# Patient Record
Sex: Female | Born: 1945 | Race: White | Hispanic: No | Smoking: Never smoker
Health system: Southern US, Community
[De-identification: ages and names within clinical notes are randomized; demographics above are authoritative.]

## PROBLEM LIST (undated history)

## (undated) DIAGNOSIS — F329 Major depressive disorder, single episode, unspecified: Secondary | ICD-10-CM

## (undated) DIAGNOSIS — I639 Cerebral infarction, unspecified: Secondary | ICD-10-CM

## (undated) DIAGNOSIS — M1990 Unspecified osteoarthritis, unspecified site: Secondary | ICD-10-CM

## (undated) DIAGNOSIS — I10 Essential (primary) hypertension: Secondary | ICD-10-CM

## (undated) DIAGNOSIS — M51369 Other intervertebral disc degeneration, lumbar region without mention of lumbar back pain or lower extremity pain: Secondary | ICD-10-CM

## (undated) DIAGNOSIS — M545 Low back pain, unspecified: Secondary | ICD-10-CM

## (undated) DIAGNOSIS — E7800 Pure hypercholesterolemia, unspecified: Secondary | ICD-10-CM

## (undated) DIAGNOSIS — F32A Depression, unspecified: Secondary | ICD-10-CM

## (undated) DIAGNOSIS — B019 Varicella without complication: Secondary | ICD-10-CM

## (undated) DIAGNOSIS — H9190 Unspecified hearing loss, unspecified ear: Secondary | ICD-10-CM

## (undated) DIAGNOSIS — Z973 Presence of spectacles and contact lenses: Secondary | ICD-10-CM

## (undated) DIAGNOSIS — F419 Anxiety disorder, unspecified: Secondary | ICD-10-CM

## (undated) DIAGNOSIS — A6000 Herpesviral infection of urogenital system, unspecified: Secondary | ICD-10-CM

## (undated) DIAGNOSIS — R011 Cardiac murmur, unspecified: Secondary | ICD-10-CM

## (undated) DIAGNOSIS — M5136 Other intervertebral disc degeneration, lumbar region: Secondary | ICD-10-CM

## (undated) DIAGNOSIS — Z9289 Personal history of other medical treatment: Secondary | ICD-10-CM

## (undated) DIAGNOSIS — I341 Nonrheumatic mitral (valve) prolapse: Secondary | ICD-10-CM

## (undated) DIAGNOSIS — R51 Headache: Secondary | ICD-10-CM

## (undated) DIAGNOSIS — IMO0001 Reserved for inherently not codable concepts without codable children: Secondary | ICD-10-CM

## (undated) DIAGNOSIS — G8929 Other chronic pain: Secondary | ICD-10-CM

## (undated) DIAGNOSIS — M48061 Spinal stenosis, lumbar region without neurogenic claudication: Secondary | ICD-10-CM

## (undated) HISTORY — PX: FRACTURE SURGERY: SHX138

## (undated) HISTORY — DX: Depression, unspecified: F32.A

## (undated) HISTORY — DX: Major depressive disorder, single episode, unspecified: F32.9

## (undated) HISTORY — DX: Varicella without complication: B01.9

## (undated) HISTORY — DX: Anxiety disorder, unspecified: F41.9

## (undated) HISTORY — DX: Headache: R51

## (undated) HISTORY — DX: Essential (primary) hypertension: I10

## (undated) HISTORY — DX: Nonrheumatic mitral (valve) prolapse: I34.1

## (undated) HISTORY — DX: Herpesviral infection of urogenital system, unspecified: A60.00

---
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
## 1966-01-28 HISTORY — PX: PLANTAR FASCIA RELEASE: SHX2239

## 1973-01-28 DIAGNOSIS — Z9289 Personal history of other medical treatment: Secondary | ICD-10-CM

## 1973-01-28 HISTORY — PX: NASAL SINUS SURGERY: SHX719

## 1973-01-28 HISTORY — PX: FEMUR FRACTURE SURGERY: SHX633

## 1973-01-28 HISTORY — DX: Personal history of other medical treatment: Z92.89

## 1984-04-22 ENCOUNTER — Encounter: Payer: Self-pay | Admitting: Internal Medicine

## 1984-12-25 ENCOUNTER — Encounter: Payer: Self-pay | Admitting: Internal Medicine

## 1993-01-28 HISTORY — PX: TUBAL LIGATION: SHX77

## 1996-01-29 HISTORY — PX: SHOULDER OPEN ROTATOR CUFF REPAIR: SHX2407

## 1998-07-20 ENCOUNTER — Other Ambulatory Visit: Admission: RE | Admit: 1998-07-20 | Discharge: 1998-07-20 | Payer: Self-pay | Admitting: Obstetrics and Gynecology

## 1999-07-24 ENCOUNTER — Other Ambulatory Visit: Admission: RE | Admit: 1999-07-24 | Discharge: 1999-07-24 | Payer: Self-pay | Admitting: Obstetrics and Gynecology

## 1999-09-03 ENCOUNTER — Encounter: Payer: Self-pay | Admitting: Internal Medicine

## 2000-04-03 ENCOUNTER — Emergency Department (HOSPITAL_COMMUNITY): Admission: EM | Admit: 2000-04-03 | Discharge: 2000-04-04 | Payer: Self-pay | Admitting: Emergency Medicine

## 2000-04-03 ENCOUNTER — Encounter: Payer: Self-pay | Admitting: Emergency Medicine

## 2000-04-04 ENCOUNTER — Encounter: Payer: Self-pay | Admitting: Emergency Medicine

## 2000-08-12 ENCOUNTER — Other Ambulatory Visit: Admission: RE | Admit: 2000-08-12 | Discharge: 2000-08-12 | Payer: Self-pay | Admitting: Obstetrics and Gynecology

## 2001-08-18 ENCOUNTER — Other Ambulatory Visit: Admission: RE | Admit: 2001-08-18 | Discharge: 2001-08-18 | Payer: Self-pay | Admitting: Obstetrics and Gynecology

## 2003-01-18 ENCOUNTER — Other Ambulatory Visit: Admission: RE | Admit: 2003-01-18 | Discharge: 2003-01-18 | Payer: Self-pay | Admitting: Obstetrics and Gynecology

## 2004-03-08 ENCOUNTER — Other Ambulatory Visit: Admission: RE | Admit: 2004-03-08 | Discharge: 2004-03-08 | Payer: Self-pay | Admitting: Obstetrics and Gynecology

## 2005-03-05 ENCOUNTER — Other Ambulatory Visit: Admission: RE | Admit: 2005-03-05 | Discharge: 2005-03-05 | Payer: Self-pay | Admitting: Obstetrics and Gynecology

## 2005-03-05 ENCOUNTER — Encounter: Payer: Self-pay | Admitting: Internal Medicine

## 2005-04-08 ENCOUNTER — Ambulatory Visit (HOSPITAL_COMMUNITY): Admission: RE | Admit: 2005-04-08 | Discharge: 2005-04-08 | Payer: Self-pay | Admitting: Obstetrics and Gynecology

## 2005-05-13 ENCOUNTER — Ambulatory Visit: Payer: Self-pay | Admitting: Internal Medicine

## 2006-04-04 ENCOUNTER — Other Ambulatory Visit: Admission: RE | Admit: 2006-04-04 | Discharge: 2006-04-04 | Payer: Self-pay | Admitting: Obstetrics and Gynecology

## 2006-04-11 ENCOUNTER — Ambulatory Visit (HOSPITAL_COMMUNITY): Admission: RE | Admit: 2006-04-11 | Discharge: 2006-04-11 | Payer: Self-pay | Admitting: Obstetrics and Gynecology

## 2006-04-23 ENCOUNTER — Encounter: Admission: RE | Admit: 2006-04-23 | Discharge: 2006-04-23 | Payer: Self-pay | Admitting: Family Medicine

## 2006-11-12 ENCOUNTER — Encounter: Payer: Self-pay | Admitting: Internal Medicine

## 2006-11-12 ENCOUNTER — Encounter: Admission: RE | Admit: 2006-11-12 | Discharge: 2006-11-12 | Payer: Self-pay | Admitting: Obstetrics and Gynecology

## 2006-11-19 DIAGNOSIS — I059 Rheumatic mitral valve disease, unspecified: Secondary | ICD-10-CM | POA: Insufficient documentation

## 2006-11-19 DIAGNOSIS — I10 Essential (primary) hypertension: Secondary | ICD-10-CM

## 2006-12-12 ENCOUNTER — Encounter: Payer: Self-pay | Admitting: Internal Medicine

## 2006-12-15 ENCOUNTER — Other Ambulatory Visit: Admission: RE | Admit: 2006-12-15 | Discharge: 2006-12-15 | Payer: Self-pay | Admitting: Obstetrics and Gynecology

## 2007-01-29 LAB — CONVERTED CEMR LAB: Pap Smear: NORMAL

## 2007-06-16 ENCOUNTER — Ambulatory Visit: Payer: Self-pay | Admitting: Internal Medicine

## 2007-11-18 ENCOUNTER — Ambulatory Visit (HOSPITAL_COMMUNITY): Admission: RE | Admit: 2007-11-18 | Discharge: 2007-11-18 | Payer: Self-pay | Admitting: Internal Medicine

## 2008-01-13 ENCOUNTER — Ambulatory Visit: Payer: Self-pay | Admitting: Internal Medicine

## 2008-02-10 ENCOUNTER — Telehealth: Payer: Self-pay | Admitting: Internal Medicine

## 2008-03-24 ENCOUNTER — Telehealth: Payer: Self-pay | Admitting: Internal Medicine

## 2009-03-23 ENCOUNTER — Telehealth: Payer: Self-pay | Admitting: Internal Medicine

## 2009-03-28 ENCOUNTER — Ambulatory Visit: Payer: Self-pay | Admitting: Internal Medicine

## 2010-01-31 ENCOUNTER — Ambulatory Visit: Admit: 2010-01-31 | Payer: Self-pay | Admitting: Internal Medicine

## 2010-02-01 ENCOUNTER — Telehealth: Payer: Self-pay | Admitting: Internal Medicine

## 2010-02-27 NOTE — Assessment & Plan Note (Signed)
Summary: med check/refills/cjr   Vital Signs:  Patient profile:   65 year old female Weight:      154 pounds Temp:     98.3 degrees F oral Pulse rate:   72 / minute Pulse rhythm:   regular Resp:     12 per minute BP sitting:   132 / 66  (left arm) Cuff size:   regular  Vitals Entered By: Gladis Riffle, RN (March 28, 2009 9:38 AM) CC: med review and refills Is Patient Diabetic? No Comments c/o sinus congestion for 12 days   CC:  med review and refills.  History of Present Illness:  Follow-Up Visit      This is a 65 year old woman who presents for Follow-up visit.  The patient denies chest pain and palpitations.  Since the last visit the patient notes no new problems or concerns.  The patient reports taking meds as prescribed and not monitoring BP.  When questioned about possible medication side effects, the patient notes none.    All other systems reviewed and were negative   Preventive Screening-Counseling & Management  Alcohol-Tobacco     Alcohol drinks/day: 2     Smoking Status: never  Current Problems (verified): 1)  Mitral Valve Prolapse  (ICD-424.0) 2)  Hypertension  (ICD-401.9)  Current Medications (verified): 1)  Metoprolol Tartrate 100 Mg Tabs (Metoprolol Tartrate) .... Take 1/2 Tablet By Mouth Once A Day 2)  Estradiol 1 Mg Tabs (Estradiol) .... Take 1 Tablet By Mouth Once A Day 3)  Provera 5 Mg  Tabs (Medroxyprogesterone Acetate) .... Take 1 Tablet By Mouth Once A Day--Needs Office Visit For Additional Refills 4)  Caltrate 600+d 600-400 Mg-Unit  Tabs (Calcium Carbonate-Vitamin D) .... Once Daily 5)  Multivitamins   Tabs (Multiple Vitamin) .... Once Daily 6)  Vitamin E 400 Unit  Caps (Vitamin E) .... Once Daily 7)  Claritin 10 Mg  Tabs (Loratadine) .... Once Daily 8)  Zoloft 50 Mg Tabs (Sertraline Hcl) .Marland Kitchen.. 1 By Mouth Daily  Allergies: 1)  Sulfamethoxazole (Sulfamethoxazole)   Impression & Recommendations:  Problem # 1:  MITRAL VALVE PROLAPSE  (ICD-424.0)  Her updated medication list for this problem includes:    Metoprolol Tartrate 100 Mg Tabs (Metoprolol tartrate) .Marland Kitchen... Take 1/2 tablet by mouth once a day  Problem # 2:  HYPERTENSION (ICD-401.9) she understands need for health maintenance and lab work she refuses all basedon finances I'll supply with meds and wil see back in december Her updated medication list for this problem includes:    Metoprolol Tartrate 100 Mg Tabs (Metoprolol tartrate) .Marland Kitchen... Take 1/2 tablet by mouth once a day  BP today: 132/66 Prior BP: 140/76 (01/13/2008)  Complete Medication List: 1)  Metoprolol Tartrate 100 Mg Tabs (Metoprolol tartrate) .... Take 1/2 tablet by mouth once a day 2)  Estradiol 1 Mg Tabs (Estradiol) .... Take 1 tablet by mouth once a day 3)  Provera 5 Mg Tabs (Medroxyprogesterone acetate) .... Take 1 tablet by mouth once a day--needs office visit for additional refills 4)  Caltrate 600+d 600-400 Mg-unit Tabs (Calcium carbonate-vitamin d) .... Once daily 5)  Multivitamins Tabs (Multiple vitamin) .... Once daily 6)  Vitamin E 400 Unit Caps (Vitamin e) .... Once daily 7)  Claritin 10 Mg Tabs (Loratadine) .... Once daily 8)  Zoloft 50 Mg Tabs (Sertraline hcl) .Marland Kitchen.. 1 by mouth daily  Contraindications/Deferment of Procedures/Staging:    Test/Procedure: Colonoscopy    Reason for deferment: declined-financial     Test/Procedure: Mammogram  Reason for deferment: declined-financial     Test/Procedure: PAP Smear    Reason for deferment: declined-financial     Test/Procedure: FLU VAX    Reason for deferment: declined-financial     Test/Procedure: DPT vaccine    Reason for deferment: declined   Patient Instructions: 1)  see me in December (new medicare wellness) Prescriptions: ESTRADIOL 1 MG TABS (ESTRADIOL) Take 1 tablet by mouth once a day  #30 x 11   Entered and Authorized by:   Birdie Sons MD   Signed by:   Birdie Sons MD on 03/28/2009   Method used:   [facility]         [facility]  [address] [phone]       [address]       [place], [place]  [address]       Ph: [phone] or [phone]       Fax: [phone]   RxID:   [id number] ZOLOFT 50 MG TABS (SERTRALINE HCL) 1 by mouth daily  #30 x 11   Entered and Authorized by:   Birdie Sons MD   Signed by:   Birdie Sons MD on 03/28/2009   Method used:   [facility]        [facility]  [address] [phone]* (retail)       [address]       [place], [place]  [address]       Ph: [phone] or [phone]       Fax: [phone]   RxID:   [id number] PROVERA 5 MG  TABS (MEDROXYPROGESTERONE ACETATE) Take 1 tablet by mouth once a day--needs office visit for additional refills  #12 x 11   Entered and Authorized by:   Birdie Sons MD   Signed by:   Birdie Sons MD on 03/28/2009   Method used:   [facility]        [facility] [phone][facility] ([facility]ail[facility]       [address]       [place], [place]  [address]       Ph: [phone] or [phone]       Fax: [phone]   RxID:   [id number] METOPROLOL TARTRATE 100 MG TABS (METOPROLOL TARTRATE) Take 1/2 tablet by mouth once a day  #60 x 11   Entered and Authorized by:   Birdie Sons MD   Signed by:   Birdie Sons MD on 03/28/2009   Method used:   [facility]        [facility] [phone]* (retail)       [address]       [place], [place]  [id number]       Ph: [phone] or [phone]       Fax: [phone]   RxID:   [id number]

## 2010-02-27 NOTE — Progress Notes (Signed)
Summary: Pt has ov sch on 03/28/09. Req partial refill Metoprolol   Phone Note Call from Patient Call back at Home Phone [phone]   Caller: Patient Summary of Call: Pt has sch ov for 03/28/09 at 9:30am. Pt is req partial refill of Metoprolol 100mg  to last until her ov. [facility] [address] [phone] Initial call taken by: Lucy Antigua,  March 23, 2009 10:07 AM    Prescriptions: METOPROLOL TARTRATE 100 MG TABS (METOPROLOL TARTRATE) Take 1/2 tablet by mouth once a day--NEEDS OFFICE VISIT FOR ADDITIONAL REFILLS  #15 x 0   Entered by:   Willy Eddy, LPN   Authorized by:   Birdie Sons MD   Signed by:   Willy Eddy, LPN on 59/56/3875   Method used:   [facility]        [facility] [phone][facility] (retail)       [address]       [place], [place]  [address]       Ph: [phone] or [phone]       Fax: [phone]   RxID:   [id number]

## 2010-03-01 NOTE — Progress Notes (Signed)
Summary: refill until cpx   Phone Note Refill Request Call back at Home Phone [phone] Message from:  Patient---live call  Refills Requested: Medication #1:  ESTRADIOL 1 MG TABS Take 1 tablet by mouth once a day [facility] pt missed her appt due to family emergency. she did set up a medicare cpx for 03-30-2010.  Initial call taken by: Warnell Forester,  February 01, 2010 10:23 AM  Follow-up for Phone Call        Rx called to pharmacy Follow-up by: Alfred Levins, CMA,  February 01, 2010 1:31 PM    Prescriptions: ESTRADIOL 1 MG TABS (ESTRADIOL) Take 1 tablet by mouth once a day  #90 Tablet x 0   Entered by:   Alfred Levins, CMA   Authorized by:   Birdie Sons MD   Signed by:   Alfred Levins, CMA on 02/01/2010   Method used:   [facility]        [facility]  [address] [phone]* (retail)       [address]       [place], [place]  [address]       Ph: [phone] or [phone]       Fax: [phone]   RxID:   [id number]

## 2010-03-12 ENCOUNTER — Other Ambulatory Visit: Payer: Self-pay | Admitting: Internal Medicine

## 2010-03-30 ENCOUNTER — Encounter: Payer: Self-pay | Admitting: Internal Medicine

## 2010-04-17 ENCOUNTER — Encounter: Payer: Self-pay | Admitting: Internal Medicine

## 2010-04-20 ENCOUNTER — Other Ambulatory Visit (HOSPITAL_COMMUNITY)
Admission: RE | Admit: 2010-04-20 | Discharge: 2010-04-20 | Disposition: A | Discharge status: Home / Self Care | Payer: PRIVATE HEALTH INSURANCE | Source: Ambulatory Visit | Attending: Internal Medicine | Admitting: Internal Medicine

## 2010-04-20 ENCOUNTER — Ambulatory Visit (INDEPENDENT_AMBULATORY_CARE_PROVIDER_SITE_OTHER): Payer: PRIVATE HEALTH INSURANCE | Admitting: Internal Medicine

## 2010-04-20 ENCOUNTER — Encounter: Payer: Self-pay | Admitting: Internal Medicine

## 2010-04-20 DIAGNOSIS — Z124 Encounter for screening for malignant neoplasm of cervix: Secondary | ICD-10-CM | POA: Insufficient documentation

## 2010-04-20 DIAGNOSIS — N959 Unspecified menopausal and perimenopausal disorder: Secondary | ICD-10-CM

## 2010-04-20 DIAGNOSIS — F329 Major depressive disorder, single episode, unspecified: Secondary | ICD-10-CM

## 2010-04-20 DIAGNOSIS — I10 Essential (primary) hypertension: Secondary | ICD-10-CM

## 2010-04-20 DIAGNOSIS — Z79899 Other long term (current) drug therapy: Secondary | ICD-10-CM

## 2010-04-20 DIAGNOSIS — Z23 Encounter for immunization: Secondary | ICD-10-CM

## 2010-04-20 DIAGNOSIS — E785 Hyperlipidemia, unspecified: Secondary | ICD-10-CM

## 2010-04-20 DIAGNOSIS — Z0000 Encounter for general adult medical examination without abnormal findings: Secondary | ICD-10-CM

## 2010-04-20 LAB — HEPATIC FUNCTION PANEL
ALT: 15 U/L (ref 0–35)
Albumin: 3.9 g/dL (ref 3.5–5.2)
Alkaline Phosphatase: 50 U/L (ref 39–117)
Bilirubin, Direct: 0.1 mg/dL (ref 0.0–0.3)
Total Protein: 6.1 g/dL (ref 6.0–8.3)

## 2010-04-20 LAB — POCT URINALYSIS DIPSTICK
Blood, UA: NEGATIVE
Ketones, UA: NEGATIVE
Protein, UA: NEGATIVE
Spec Grav, UA: 1.02
pH, UA: 7

## 2010-04-20 LAB — LIPID PANEL
Cholesterol: 210 mg/dL — ABNORMAL HIGH (ref 0–200)
HDL: 96.7 mg/dL (ref >39.00)

## 2010-04-20 LAB — BASIC METABOLIC PANEL
BUN: 17 mg/dL (ref 6–23)
CO2: 28 mEq/L (ref 19–32)
Calcium: 9.5 mg/dL (ref 8.4–10.5)
GFR: 87.73 mL/min (ref >60.00)
Glucose, Bld: 115 mg/dL — ABNORMAL HIGH (ref 70–99)
Sodium: 142 mEq/L (ref 135–145)

## 2010-04-20 LAB — CBC WITH DIFFERENTIAL/PLATELET
Basophils Absolute: 0 10*3/uL (ref 0.0–0.1)
Eosinophils Absolute: 0.3 10*3/uL (ref 0.0–0.7)
Hemoglobin: 12.7 g/dL (ref 12.0–15.0)
Lymphocytes Relative: 16.5 % (ref 12.0–46.0)
Lymphs Abs: 1.2 10*3/uL (ref 0.7–4.0)
MCHC: 34.7 g/dL (ref 30.0–36.0)
Neutro Abs: 5.5 10*3/uL (ref 1.4–7.7)
Platelets: 321 10*3/uL (ref 150.0–400.0)
RDW: 13.1 % (ref 11.5–14.6)

## 2010-04-20 MED ORDER — SERTRALINE HCL 50 MG PO TABS: 50.0000 mg | ORAL_TABLET | Freq: Every day | ORAL | Status: DC

## 2010-04-20 MED ORDER — MEDROXYPROGESTERONE ACETATE 2.5 MG PO TABS: 2.5000 mg | ORAL_TABLET | Freq: Every day | ORAL | Status: DC

## 2010-04-20 MED ORDER — ESTRADIOL 1 MG PO TABS: 1.0000 mg | ORAL_TABLET | Freq: Every day | ORAL | Status: DC

## 2010-04-20 MED ORDER — METOPROLOL TARTRATE 50 MG PO TABS: 50.0000 mg | ORAL_TABLET | Freq: Every day | ORAL | Status: DC

## 2010-04-20 NOTE — Progress Notes (Signed)
  Subjective:    Patient ID: Heather Heather [name], female    DOB: 1945/02/09, 66 y.o.   MRN: [id number]  HPI  cpx  Past Medical History  Diagnosis Date  . MVP (mitral valve prolapse)   . Hypertension    Past Surgical History  Procedure Date  . Tubal ligation   . Tibia fracture surgery     reports that she has never smoked. She does not have any smokeless tobacco history on file. She reports that she drinks alcohol. She reports that she does not use illicit drugs. family history includes Diabetes in her father; Heart attack in her father; and Osteoporosis in her mother. Allergies  Allergen Reactions  . Sulfamethoxazole     REACTION: unspecified      Review of Systems  patient denies chest pain, shortness of breath, orthopnea. Denies lower extremity edema, abdominal pain, change in appetite, change in bowel movements. Patient denies rashes, musculoskeletal complaints. No other specific complaints in a complete review of systems.      Objective:   Physical Exam  Well-developed well-nourished female in no acute distress. HEENT exam atraumatic, normocephalic, extraocular muscles are intact. Neck is supple. No jugular venous distention no thyromegaly. Chest clear to auscultation without increased work of breathing. Cardiac exam S1 and S2 are regular. Abdominal exam active bowel sounds, soft, nontender. Extremities no edema. Neurologic exam she is Heather [name] without any motor sensory deficits. Gait is normal. Pelvic and breast exams are normal. Pap done        Assessment & Plan:   Well visit---health maint UTD Will change provera to daily

## 2010-04-23 ENCOUNTER — Encounter: Payer: Self-pay | Admitting: Internal Medicine

## 2010-04-23 ENCOUNTER — Telehealth: Payer: Self-pay | Admitting: *Deleted

## 2010-04-23 NOTE — Telephone Encounter (Signed)
Pt aware.

## 2010-04-23 NOTE — Telephone Encounter (Signed)
i'm not sure what the issue is.. Provera was changed from 5 mg 12 days per month to 2.5 mg daily----clearly labeled in OV note

## 2010-04-23 NOTE — Telephone Encounter (Signed)
Pt stated that Dr Cato Mulligan was going to change his Provera and when she went to p/u the rx it was the same.

## 2010-05-02 ENCOUNTER — Telehealth: Payer: Self-pay | Admitting: *Deleted

## 2010-05-02 MED ORDER — FLUTICASONE PROPIONATE 50 MCG/ACT NA SUSP: 2.0000 | Freq: Every day | NASAL | Status: DC

## 2010-05-02 NOTE — Telephone Encounter (Signed)
Pt is complaining of lot of pressure in head with bends over.  Forehead hurts. No congestion. No post nasal drainage.  Takes Claritin every day.  Has been on allergy shots in the past.   Pt knows the pollen is causing her problems.

## 2010-05-02 NOTE — Telephone Encounter (Signed)
Trial flonase nasal spray, 2 sprays each nostril daily

## 2010-06-26 ENCOUNTER — Encounter: Payer: Self-pay | Admitting: Gastroenterology

## 2010-06-26 NOTE — Telephone Encounter (Signed)
Error

## 2010-07-02 ENCOUNTER — Ambulatory Visit (INDEPENDENT_AMBULATORY_CARE_PROVIDER_SITE_OTHER): Payer: PRIVATE HEALTH INSURANCE | Admitting: Internal Medicine

## 2010-07-02 ENCOUNTER — Encounter: Payer: Self-pay | Admitting: Internal Medicine

## 2010-07-02 VITALS — BP 146/76 | Temp 98.6°F | Wt 138.5 lb

## 2010-07-02 DIAGNOSIS — M549 Dorsalgia, unspecified: Secondary | ICD-10-CM

## 2010-07-02 MED ORDER — HYDROCODONE-ACETAMINOPHEN 5-500 MG PO TABS: 1.0000 | ORAL_TABLET | Freq: Four times a day (QID) | ORAL | Status: DC | PRN

## 2010-07-02 MED ORDER — KETOROLAC TROMETHAMINE 30 MG/ML IJ SOLN
30.0000 mg | Freq: Once | INTRAMUSCULAR | Status: AC
  Administered 2010-07-02: 30 mg via INTRAMUSCULAR

## 2010-07-02 MED ORDER — DICLOFENAC SODIUM 75 MG PO TBEC: DELAYED_RELEASE_TABLET | ORAL | Status: DC

## 2010-07-02 MED ORDER — CYCLOBENZAPRINE HCL 10 MG PO TABS: 10.0000 mg | ORAL_TABLET | Freq: Three times a day (TID) | ORAL | Status: DC | PRN

## 2010-07-08 DIAGNOSIS — M549 Dorsalgia, unspecified: Secondary | ICD-10-CM | POA: Insufficient documentation

## 2010-07-08 NOTE — Assessment & Plan Note (Signed)
Toradol IM injxn. Begin voltaren prn with food and no other nsaids. Attempt flexeril prn and cautioned regarding possible sedating effect. Vicodin prn pain. Followup if no improvement or worsening.

## 2010-07-08 NOTE — Progress Notes (Signed)
  Subjective:    Patient ID: Heather Heather [name], female    DOB: [birth year], 65 y.o.   MRN: [id number]  HPI Pt presents to clinic for evaluation of back pain. Pt states ~4 day h/o low back pain and stiffness. Occurs intermittently in left low back without radicular leg pain, paresthesias or leg weakness. 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 Attempted tramadol without improvement. No other exacerbating or alleviating factors. No other complaints.  Reviewed pmh, medications and allergies  Review of Systems See HPI     Objective:   Physical Exam  Nursing note and vitals reviewed. Constitutional: She appears well-developed and well-nourished. No distress.  HENT:  Head: Normocephalic and atraumatic.  Right Ear: External ear normal.  Left Ear: External ear normal.  Eyes: Conjunctivae are normal. No scleral icterus.  Musculoskeletal:       No midline ls tenderness or bony abn. 5/5 le strength bilaterally. Modified slr neg. Gait slow but otherwise nl. + paraspinal muscle spasm  Neurological: She is Heather [name].  Skin: Skin is warm and dry. No rash noted. She is not diaphoretic. No erythema.  Psychiatric: She has a normal mood and affect.          Assessment & Plan:

## 2010-07-09 ENCOUNTER — Ambulatory Visit (INDEPENDENT_AMBULATORY_CARE_PROVIDER_SITE_OTHER): Payer: PRIVATE HEALTH INSURANCE | Admitting: Internal Medicine

## 2010-07-09 ENCOUNTER — Encounter: Payer: Self-pay | Admitting: Internal Medicine

## 2010-07-09 DIAGNOSIS — J069 Acute upper respiratory infection, unspecified: Secondary | ICD-10-CM | POA: Insufficient documentation

## 2010-07-09 DIAGNOSIS — M549 Dorsalgia, unspecified: Secondary | ICD-10-CM

## 2010-07-09 MED ORDER — DOXYCYCLINE HYCLATE 100 MG PO TABS: 100.0000 mg | ORAL_TABLET | Freq: Two times a day (BID) | ORAL | Status: AC

## 2010-07-09 NOTE — Progress Notes (Signed)
  Subjective:    Patient ID: Heather Heather [name], female    DOB: 01-Apr-1945, 65 y.o.   MRN: [id number]  HPI Pt presents to clinic for followup of back pain and evaluation of cough. Notes 4d h/o ST, left ear discomfort and cough productive for brown sputum without hemoptysis. Denies f/c, dyspnea or wheezing. No exacerbating or alleviating factors. No sick exposures. Also recently evaluated for msk back pain tx'ed conservatively with medication. Notes improvement or pain and remains without radicular pain, paresthesias or leg weakness.  No other complaints.  Reviewed pmh, medications and allergies.    Review of Systems see hpi     Objective:   Physical Exam  Nursing note and vitals reviewed. Constitutional: She appears well-developed and well-nourished. No distress.  HENT:  Head: Normocephalic and atraumatic.  Right Ear: Tympanic membrane, external ear and ear canal normal.  Left Ear: Tympanic membrane, external ear and ear canal normal.  Nose: Nose normal.  Mouth/Throat: Oropharynx is clear and moist. No oropharyngeal exudate.  Eyes: Conjunctivae are normal. Right eye exhibits no discharge. Left eye exhibits no discharge. No scleral icterus.  Neck: Neck supple. No JVD present.  Cardiovascular: Normal rate, regular rhythm and normal heart sounds.  Exam reveals no gallop and no friction rub.   No murmur heard. Pulmonary/Chest: Effort normal and breath sounds normal. No respiratory distress. She has no wheezes. She has no rales.  Lymphadenopathy:    She has no cervical adenopathy.  Neurological: She is Heather [name].  Skin: Skin is warm and dry. No rash noted. She is not diaphoretic. No erythema.  Psychiatric: She has a normal mood and affect.          Assessment & Plan:

## 2010-07-09 NOTE — Assessment & Plan Note (Signed)
Improved. Continue conservative care.

## 2010-07-09 NOTE — Assessment & Plan Note (Signed)
Discussed otc symptomatic tx prn. Given abx to hold. Begin abx if no improvement after total duration of 8-10 days. Followup if no improvement or worsening.

## 2010-08-30 ENCOUNTER — Telehealth: Payer: Self-pay | Admitting: Internal Medicine

## 2010-08-30 MED ORDER — DICLOFENAC POTASSIUM 50 MG PO TABS: 50.0000 mg | ORAL_TABLET | Freq: Three times a day (TID) | ORAL | Status: AC

## 2010-08-30 MED ORDER — HYDROCODONE-ACETAMINOPHEN 5-500 MG PO TABS: 1.0000 | ORAL_TABLET | Freq: Four times a day (QID) | ORAL | Status: DC | PRN

## 2010-08-30 MED ORDER — CYCLOBENZAPRINE HCL 10 MG PO TABS: 10.0000 mg | ORAL_TABLET | Freq: Three times a day (TID) | ORAL | Status: DC | PRN

## 2010-08-30 NOTE — Telephone Encounter (Signed)
rx called in, d/c the hydrocodone and flexeril on the med list (which was not called into pharmacy)

## 2010-08-30 NOTE — Telephone Encounter (Signed)
Diclofenac 50 mg po qd prn back pain-take with food #30/0 refills.  Let's not refill any more hydrocodone

## 2010-08-30 NOTE — Telephone Encounter (Signed)
These meds have not been called in even though it shows on med list.  Dr Rodena Medin gave her rx's for Flexeril and Hydrocodone for low back pain in June but I don't see Diclofenac on med list

## 2010-08-30 NOTE — Telephone Encounter (Signed)
Pt requesting refill on   Diclofenac 75mg  Cyclobenzaprine 10mg  Hydrocodone 5-500

## 2010-09-10 ENCOUNTER — Telehealth: Payer: Self-pay | Admitting: *Deleted

## 2010-09-10 MED ORDER — MEDROXYPROGESTERONE ACETATE 5 MG PO TABS: 5.0000 mg | ORAL_TABLET | Freq: Every day | ORAL | Status: DC

## 2010-09-10 MED ORDER — CYCLOBENZAPRINE HCL 5 MG PO TABS: 5.0000 mg | ORAL_TABLET | Freq: Three times a day (TID) | ORAL | Status: DC | PRN

## 2010-09-10 NOTE — Telephone Encounter (Signed)
Pt is calling with 2 problems pt is taking Provera and it was decreased to 2.5mg  from 5 mg.  She wants to go back on 5 mg cause now she is spotting every other week.  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  This happened on Saturday.  Pt is having pain now and she takes diclofenac but its not helping.

## 2010-09-10 NOTE — Telephone Encounter (Signed)
Pt aware, rx sent in electronically 

## 2010-09-10 NOTE — Telephone Encounter (Signed)
Ok to increase provera to 5 mg  Groin pull- try flexeril 5 mg po bid prn #20/1 refill

## 2010-11-02 ENCOUNTER — Other Ambulatory Visit: Payer: Self-pay | Admitting: Internal Medicine

## 2010-11-02 DIAGNOSIS — Z0000 Encounter for general adult medical examination without abnormal findings: Secondary | ICD-10-CM

## 2010-11-02 MED ORDER — ESTRADIOL 1 MG PO TABS: 1.0000 mg | ORAL_TABLET | Freq: Every day | ORAL | Status: DC

## 2010-11-02 NOTE — Telephone Encounter (Signed)
rx sent in electronically 

## 2010-11-02 NOTE — Telephone Encounter (Signed)
Pt need refill on estradiol [facility] [address] [phone]

## 2010-11-05 ENCOUNTER — Telehealth: Payer: Self-pay | Admitting: Internal Medicine

## 2010-11-05 NOTE — Telephone Encounter (Signed)
Pt need med refill estradiol 1mg  [facility] [address] [phone]

## 2010-11-05 NOTE — Telephone Encounter (Signed)
[facility], called [facility] and they had it so I called in rx to [facility] on [facility]

## 2010-12-28 ENCOUNTER — Telehealth: Payer: Self-pay | Admitting: Internal Medicine

## 2010-12-28 NOTE — Telephone Encounter (Signed)
Hydrocodone will make her way too sleepy Stop flexeril Trial methocarbamate 500 mg po bid prn back pain

## 2010-12-28 NOTE — Telephone Encounter (Signed)
Tried to call but no answer

## 2010-12-28 NOTE — Telephone Encounter (Signed)
Pt is experiencing back pain again. Pt would like a diff medication, the ones she was given makes her fall asleep. 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 Pt is in a great deal of pain and it hoping possibly get an rx of hydrocodone. Please contact

## 2011-01-01 MED ORDER — METHOCARBAMOL 500 MG PO TABS: 500.0000 mg | ORAL_TABLET | Freq: Two times a day (BID) | ORAL | Status: AC

## 2011-01-01 NOTE — Telephone Encounter (Signed)
Pt aware, rx sent in electronically 

## 2011-03-28 ENCOUNTER — Encounter: Payer: Self-pay | Admitting: Family

## 2011-03-28 ENCOUNTER — Ambulatory Visit (INDEPENDENT_AMBULATORY_CARE_PROVIDER_SITE_OTHER): Payer: PRIVATE HEALTH INSURANCE | Admitting: Family

## 2011-03-28 VITALS — BP 160/80 | Temp 98.8°F | Ht 65.0 in | Wt 142.0 lb

## 2011-03-28 DIAGNOSIS — I10 Essential (primary) hypertension: Secondary | ICD-10-CM

## 2011-03-28 DIAGNOSIS — F329 Major depressive disorder, single episode, unspecified: Secondary | ICD-10-CM

## 2011-03-28 DIAGNOSIS — J069 Acute upper respiratory infection, unspecified: Secondary | ICD-10-CM

## 2011-03-28 MED ORDER — SERTRALINE HCL 50 MG PO TABS: 100.0000 mg | ORAL_TABLET | Freq: Every day | ORAL | Status: DC

## 2011-03-28 MED ORDER — CEFTRIAXONE SODIUM 1 G IJ SOLR
1.0000 g | INTRAMUSCULAR | Status: DC
  Administered 2011-03-28: 1 g via INTRAMUSCULAR

## 2011-03-28 NOTE — Progress Notes (Signed)
Subjective:    Patient ID: Heather [name] Alert, female    DOB: 03/17/1945, 66 y.o.   MRN: [id number]  HPI Comments: C/o fatigue, chills, body aches, sinus drainage, intermittent headaches,  and nonproductive cough started Sat getting progressively worse.  Headache  Associated symptoms include a fever and sinus pressure. Pertinent negatives include no hearing loss, neck pain, rhinorrhea or sore throat.   In with depression that is uncontrolled since taking care of her 76 y/o mother. The stress of taking care of her mother has become overwhelming and she is seeking nursing home placement. She is very concerned about her mother's reaction. Currently take Zoloft 50mg  QD.   Review of Systems  Constitutional: Positive for fever, chills and fatigue. Negative for activity change and appetite change.  HENT: Positive for congestion, postnasal drip and sinus pressure. Negative for hearing loss, sore throat, rhinorrhea, sneezing, trouble swallowing, neck pain, neck stiffness and ear discharge.   Respiratory: Negative.   Cardiovascular: Negative.   Neurological: Positive for headaches.   Past Medical History  Diagnosis Date  . MVP (mitral valve prolapse)   . Hypertension     History   Social History  . Marital Status: Divorced    Spouse Name: N/A    Number of Children: N/A  . Years of Education: N/A   Occupational History  . Not on file.   Social History Main Topics  . Smoking status: Never Smoker   . Smokeless tobacco: Not on file  . Alcohol Use: Yes  . Drug Use: No  . Sexually Active:    Other Topics Concern  . Not on file   Social History Narrative  . No narrative on file    Past Surgical History  Procedure Date  . Tubal ligation   . Tibia fracture surgery     Family History  Problem Relation Age of Onset  . Osteoporosis Mother   . Heart disease Mother   . Diabetes Father   . Heart attack Father     Allergies  Allergen Reactions  . Sulfamethoxazole     REACTION:  unspecified    Current Outpatient Prescriptions on File Prior to Visit  Medication Sig Dispense Refill  . Calcium Carbonate-Vitamin D (CALTRATE 600+D) 600-400 MG-UNIT per tablet Take 1 tablet by mouth daily.        Marland Kitchen estradiol (ESTRACE) 1 MG tablet Take 1 tablet (1 mg total) by mouth daily.    90 tablet  3  . loratadine (CLARITIN) 10 MG tablet Take 10 mg by mouth daily.        . medroxyPROGESTERone (PROVERA) 5 MG tablet Take 1 tablet (5 mg total) by mouth daily.  90 tablet  1  . metoprolol (LOPRESSOR) 50 MG tablet Take 1 tablet (50 mg total) by mouth daily.    90 tablet  3  . Multiple Vitamin (MULTIVITAMIN) tablet Take 1 tablet by mouth daily.        . sertraline (ZOLOFT) 50 MG tablet Take 1 tablet (50 mg total) by mouth daily.    90 tablet  3  . vitamin E 400 UNIT capsule Take 400 Units by mouth daily.        . diclofenac (CATAFLAM) 50 MG tablet Take 1 tablet (50 mg total) by mouth 3 (three) times daily.  30 tablet  0  . fluticasone (FLONASE) 50 MCG/ACT nasal spray 2 sprays by Nasal route daily.  16 g  2    BP 160/80  Temp(Src) 98.8 F (37.1 C) (Oral)  Ht 5\' 5"  (1.651 m)  Wt 142 lb (64.411 kg)  BMI 23.63 kg/m2chart     Objective:   Physical Exam  Constitutional: She is oriented to person, place, and time. She appears well-developed and well-nourished. No distress.  HENT:  Right Ear: External ear normal.  Left Ear: External ear normal.  Nose: Nose normal.  Mouth/Throat: No oropharyngeal exudate.  Eyes: Conjunctivae are normal. Right eye exhibits no discharge. Left eye exhibits no discharge.  Cardiovascular: Normal rate, regular rhythm, normal heart sounds and intact distal pulses.  Exam reveals no gallop and no friction rub.   No murmur heard. Pulmonary/Chest: Effort normal and breath sounds normal. No respiratory distress. She has no wheezes. She has no rales. She exhibits no tenderness.  Neurological: She is alert and oriented to person, place, and time.  Skin: Skin is warm  and dry. She is not diaphoretic.          Assessment & Plan:  Assessment: URI, Cough, Depression  Plan: Rest and increase po fluid intake. Amoxicillin. If s/s do not resolve in one week or get worse RTC. Teaching handouts provided on diagnosis and medication. Increase Zoloft to 100mg  po qd.

## 2011-03-28 NOTE — Patient Instructions (Signed)
Upper Respiratory Infection, Adult An upper respiratory infection (URI) is also sometimes known as the common cold. The upper respiratory tract includes the nose, sinuses, throat, trachea, and bronchi. Bronchi are the airways leading to the lungs. Most people improve within 1 week, but symptoms can last up to 2 weeks. A residual cough may last even longer.  CAUSES Many different viruses can infect the tissues lining the upper respiratory tract. The tissues become irritated and inflamed and often become very moist. Mucus production is also common. A cold is contagious. You can easily spread the virus to others by oral contact. This includes kissing, sharing a glass, coughing, or sneezing. Touching your mouth or nose and then touching a surface, which is then touched by another person, can also spread the virus. SYMPTOMS  Symptoms typically develop 1 to 3 days after you come in contact with a cold virus. Symptoms vary from person to person. They may include:  Runny nose.   Sneezing.   Nasal congestion.   Sinus irritation.   Sore throat.   Loss of voice (laryngitis).   Cough.   Fatigue.   Muscle aches.   Loss of appetite.   Headache.   Low-grade fever.  DIAGNOSIS  You might diagnose your own cold based on familiar symptoms, since most people get a cold 2 to 3 times a year. Your caregiver can confirm this based on your exam. Most importantly, your caregiver can check that your symptoms are not due to another disease such as strep throat, sinusitis, pneumonia, asthma, or epiglottitis. Blood tests, throat tests, and X-rays are not necessary to diagnose a common cold, but they may sometimes be helpful in excluding other more serious diseases. Your caregiver will decide if any further tests are required. RISKS AND COMPLICATIONS  You may be at risk for a more severe case of the common cold if you smoke cigarettes, have chronic heart disease (such as heart failure) or lung disease (such as  asthma), or if you have a weakened immune system. The very young and very old are also at risk for more serious infections. Bacterial sinusitis, middle ear infections, and bacterial pneumonia can complicate the common cold. The common cold can worsen asthma and chronic obstructive pulmonary disease (COPD). Sometimes, these complications can require emergency medical care and may be life-threatening. PREVENTION  The best way to protect against getting a cold is to practice good hygiene. Avoid oral or hand contact with people with cold symptoms. Wash your hands often if contact occurs. There is no clear evidence that vitamin C, vitamin E, echinacea, or exercise reduces the chance of developing a cold. However, it is always recommended to get plenty of rest and practice good nutrition. TREATMENT  Treatment is directed at relieving symptoms. There is no cure. Antibiotics are not effective, because the infection is caused by a virus, not by bacteria. Treatment may include:  Increased fluid intake. Sports drinks offer valuable electrolytes, sugars, and fluids.   Breathing heated mist or steam (vaporizer or shower).   Eating chicken soup or other clear broths, and maintaining good nutrition.   Getting plenty of rest.   Using gargles or lozenges for comfort.   Controlling fevers with ibuprofen or acetaminophen as directed by your caregiver.   Increasing usage of your inhaler if you have asthma.  Zinc gel and zinc lozenges, taken in the first 24 hours of the common cold, can shorten the duration and lessen the severity of symptoms. Pain medicines may help with fever, muscle   aches, and throat pain. A variety of non-prescription medicines are available to treat congestion and runny nose. Your caregiver can make recommendations and may suggest nasal or lung inhalers for other symptoms.  HOME CARE INSTRUCTIONS   Only take over-the-counter or prescription medicines for pain, discomfort, or fever as directed  by your caregiver.   Use a warm mist humidifier or inhale steam from a shower to increase air moisture. This may keep secretions moist and make it easier to breathe.   Drink enough water and fluids to keep your urine clear or pale yellow.   Rest as needed.   Return to work when your temperature has returned to normal or as your caregiver advises. You may need to stay home longer to avoid infecting others. You can also use a face mask and careful hand washing to prevent spread of the virus.  SEEK MEDICAL CARE IF:   After the first few days, you feel you are getting worse rather than better.   You need your caregiver's advice about medicines to control symptoms.   You develop chills, worsening shortness of breath, or brown or red sputum. These may be signs of pneumonia.   You develop yellow or brown nasal discharge or pain in the face, especially when you bend forward. These may be signs of sinusitis.   You develop a fever, swollen neck glands, pain with swallowing, or white areas in the back of your throat. These may be signs of strep throat.  SEEK IMMEDIATE MEDICAL CARE IF:   You have a fever.   You develop severe or persistent headache, ear pain, sinus pain, or chest pain.   You develop wheezing, a prolonged cough, cough up blood, or have a change in your usual mucus (if you have chronic lung disease).   You develop sore muscles or a stiff neck.  Document Released: 07/10/2000 Document Revised: 09/26/2010 Document Reviewed: 05/18/2010 [facility] Patient Information 2012 [place], [place].  Depression, Adolescent and Adult Depression is a true and treatable medical condition. In general there are two kinds of depression:  Depression we all experience in some form. For example depression from the death of a loved one, financial distress or natural disasters will trigger or increase depression.   Clinical depression, on the other hand, appears without an apparent cause or reason. This  depression is a disease. Depression may be caused by chemical imbalance in the body and brain or may come as a response to a physical illness. Alcohol and other drugs can cause depression.  DIAGNOSIS  The diagnosis of depression is usually based upon symptoms and medical history. TREATMENT  Treatments for depression fall into three categories. These are:  Drug therapy. There are many medicines that treat depression. Responses may vary and sometimes trial and error is necessary to determine the best medicines and dosage for a particular patient.   Psychotherapy, also called talking treatments, helps people resolve their problems by looking at them from a different point of view and by giving people insight into their own personal makeup. Traditional psychotherapy looks at a childhood source of a problem. Other psychotherapy will look at current conflicts and move toward solving those. If the cause of depression is drug use, counseling is available to help abstain. In time the depression will usually improve. If there were underlying causes for the chemical use, they can be addressed.   ECT (electroconvulsive therapy) or shock treatment is not as commonly used today. It is a very effective treatment for severe suicidal depression.  During ECT electrical impulses are applied to the head. These impulses cause a generalized seizure. It can be effective but causes a loss of memory for recent events. Sometimes this loss of memory may include the last several months.  Treat all depression or suicide threats as serious. Obtain professional help. Do not wait to see if serious depression will get better over time without help. Seek help for yourself or those around you. In the [place] the number to the National Suicide Help Lines With 24 Hour Help Are: 1-800-SUICIDE 937-282-5153 Document Released: 01/12/2000 Document Revised: 09/26/2010 Document Reviewed: 09/02/2007 [facility] Patient Information 2012 [place],  [place].

## 2011-04-03 ENCOUNTER — Other Ambulatory Visit: Payer: Self-pay | Admitting: Internal Medicine

## 2011-04-12 ENCOUNTER — Telehealth: Payer: Self-pay

## 2011-04-12 NOTE — Telephone Encounter (Signed)
Pt called to let us know that she is feeling better since starting zoloft. She inquires about possible dependency and was advised that once stresses in her life have become more manageable for her, she can schedule a follow-up appointment to discuss weaning off the zoloft. Pt verbalized understanding

## 2011-04-15 ENCOUNTER — Other Ambulatory Visit: Payer: Self-pay | Admitting: Internal Medicine

## 2011-04-18 ENCOUNTER — Telehealth: Payer: Self-pay | Admitting: Family

## 2011-04-18 NOTE — Telephone Encounter (Signed)
Rx was sent to pharmacy on 03/28/2011 #30 with 3 refills and 04/03/2011 #90 with 3 refills

## 2011-04-18 NOTE — Telephone Encounter (Signed)
Pt need refill on sertraline 50mg  2 tablets a day call [facility] [address]

## 2011-05-21 ENCOUNTER — Encounter: Payer: Self-pay | Admitting: Family

## 2011-05-21 ENCOUNTER — Ambulatory Visit (INDEPENDENT_AMBULATORY_CARE_PROVIDER_SITE_OTHER): Payer: PRIVATE HEALTH INSURANCE | Admitting: Family

## 2011-05-21 VITALS — BP 140/70 | Temp 97.8°F | Wt 142.0 lb

## 2011-05-21 DIAGNOSIS — M79609 Pain in unspecified limb: Secondary | ICD-10-CM

## 2011-05-21 DIAGNOSIS — M79641 Pain in right hand: Secondary | ICD-10-CM

## 2011-05-21 DIAGNOSIS — M545 Low back pain: Secondary | ICD-10-CM

## 2011-05-21 MED ORDER — HYDROCODONE-ACETAMINOPHEN 5-500 MG PO TABS: 1.0000 | ORAL_TABLET | Freq: Three times a day (TID) | ORAL | Status: AC | PRN

## 2011-05-21 MED ORDER — MELOXICAM 15 MG PO TABS: 15.0000 mg | ORAL_TABLET | Freq: Every day | ORAL | Status: DC

## 2011-05-21 NOTE — Patient Instructions (Signed)
Back Exercises Back exercises help treat and prevent back injuries. The goal of back exercises is to increase the strength of your abdominal and back muscles and the flexibility of your back. These exercises should be started when you no longer have back pain. Back exercises include:  Pelvic Tilt. Lie on your back with your knees bent. Tilt your pelvis until the lower part of your back is against the floor. Hold this position 5 to 10 sec and repeat 5 to 10 times.   Knee to Chest. Pull first 1 knee up against your chest and hold for 20 to 30 seconds, repeat this with the other knee, and then both knees. This may be done with the other leg straight or bent, whichever feels better.   Sit-Ups or Curl-Ups. Bend your knees 90 degrees. Start with tilting your pelvis, and do a partial, slow sit-up, lifting your trunk only 30 to 45 degrees off the floor. Take at least 2 to 3 seconds for each sit-up. Do not do sit-ups with your knees out straight. If partial sit-ups are difficult, simply do the above but with only tightening your abdominal muscles and holding it as directed.   Hip-Lift. Lie on your back with your knees flexed 90 degrees. Push down with your feet and shoulders as you raise your hips a couple inches off the floor; hold for 10 seconds, repeat 5 to 10 times.   Back arches. Lie on your stomach, propping yourself up on bent elbows. Slowly press on your hands, causing an arch in your low back. Repeat 3 to 5 times. Any initial stiffness and discomfort should lessen with repetition over time.   Shoulder-Lifts. Lie face down with arms beside your body. Keep hips and torso pressed to floor as you slowly lift your head and shoulders off the floor.  Do not overdo your exercises, especially in the beginning. Exercises may cause you some mild back discomfort which lasts for a few minutes; however, if the pain is more severe, or lasts for more than 15 minutes, do not continue exercises until you see your  caregiver. Improvement with exercise therapy for back problems is slow.  See your caregivers for assistance with developing a proper back exercise program. Document Released: 02/22/2004 Document Revised: 01/03/2011 Document Reviewed: 01/14/2005 [employer] Patient Information 2012 [employer]. 

## 2011-05-21 NOTE — Progress Notes (Signed)
Subjective:    Patient ID: Heather [name], female    DOB: Feb 27, 1945, 66 y.o.   MRN: [id number]  HPI Comments: 66 yo white female presents with c/o nonradiating low back discomfort and right hand third metacarpal discomfort with movement getting progressively worse x weeks.Pain can be intermittent or constant.  Was caring for sick mother turning in bed by self. Low back discomfort 9/10 described as throbbing/shooting unrelieved with voltaren and gets worse in the am when getting oob. Difficulty turning over while in bed. Right third digit discomfort worse with movement or "picking up things." Pain finger 6/10 and relieved with rest. Also request to lower dose of zoloft. "I feel less ability to concentrate." Denies suicidal/homocidal ideations, crying spells, helplessness, change in appetite, or insomnia.  Back Pain  Hand Pain       Review of Systems  Constitutional: Positive for activity change. Negative for chills, diaphoresis, appetite change and fatigue.  Respiratory: Negative.   Cardiovascular: Negative.   Musculoskeletal: Positive for back pain, joint swelling and arthralgias. Negative for myalgias and gait problem.  Psychiatric/Behavioral: Positive for decreased concentration. Negative for suicidal ideas, hallucinations, behavioral problems, confusion, sleep disturbance, self-injury, dysphoric mood and agitation. The patient is not nervous/anxious and is not hyperactive.    Past Medical History  Diagnosis Date  . MVP (mitral valve prolapse)   . Hypertension     History   Social History  . Marital Status: Divorced    Spouse Name: N/A    Number of Children: N/A  . Years of Education: N/A   Occupational History  . Not on file.   Social History Main Topics  . Smoking status: Never Smoker   . Smokeless tobacco: Not on file  . Alcohol Use: Yes  . Drug Use: No  . Sexually Active:    Other Topics Concern  . Not on file   Social History Narrative  . No narrative on file     Past Surgical History  Procedure Date  . Tubal ligation   . Tibia fracture surgery     Family History  Problem Relation Age of Onset  . Osteoporosis Mother   . Heart disease Mother   . Diabetes Father   . Heart attack Father     Allergies  Allergen Reactions  . Sulfamethoxazole     REACTION: unspecified    Current Outpatient Prescriptions on File Prior to Visit  Medication Sig Dispense Refill  . Calcium Carbonate-Vitamin D (CALTRATE 600+D) 600-400 MG-UNIT per tablet Take 1 tablet by mouth daily.        . diclofenac (CATAFLAM) 50 MG tablet Take 1 tablet (50 mg total) by mouth 3 (three) times daily.  30 tablet  0  . estradiol (ESTRACE) 1 MG tablet Take 1 tablet (1 mg total) by mouth daily.    90 tablet  3  . loratadine (CLARITIN) 10 MG tablet Take 10 mg by mouth daily.        . medroxyPROGESTERone (PROVERA) 5 MG tablet Take 1 tablet (5 mg total) by mouth daily.  90 tablet  1  . metoprolol (LOPRESSOR) 50 MG tablet TAKE 1 TABLET BY MOUTH DAILY  90 tablet  0  . Multiple Vitamin (MULTIVITAMIN) tablet Take 1 tablet by mouth daily.        . sertraline (ZOLOFT) 50 MG tablet Take 2 tablets (100 mg total) by mouth daily.  30 tablet  3  . sertraline (ZOLOFT) 50 MG tablet TAKE 1 TABLET BY MOUTH DAILY  90 tablet  3  . vitamin E 400 UNIT capsule Take 400 Units by mouth daily.        . fluticasone (FLONASE) 50 MCG/ACT nasal spray 2 sprays by Nasal route daily.  16 g  2   Current Facility-Administered Medications on File Prior to Visit  Medication Dose Route Frequency Provider Last Rate Last Dose  . cefTRIAXone (ROCEPHIN) injection 1 g  1 g Intramuscular Q24H Baker Pierini, FNP   1 g at 03/28/11 1509    BP 140/70  Temp(Src) 97.8 F (36.6 C) (Oral)  Wt 142 lb (64.411 kg)chart    Objective:   Physical Exam  Constitutional: She is oriented to person, place, and time. She appears well-developed and well-nourished. No distress.  Cardiovascular: Normal rate, regular rhythm, normal  heart sounds and intact distal pulses.  Exam reveals no gallop and no friction rub.   No murmur heard. Pulmonary/Chest: Effort normal and breath sounds normal. No respiratory distress. She has no wheezes. She has no rales. She exhibits no tenderness.  Abdominal: She exhibits distension.  Musculoskeletal: Normal range of motion. She exhibits tenderness. She exhibits no edema.       Right 3rd metacarpal tender to palpate and to move  Neurological: She is [name] and oriented to person, place, and time.  Skin: Skin is warm and dry. She is not diaphoretic.  Psychiatric: She has a normal mood and affect. Her behavior is normal. Judgment and thought content normal.          Assessment & Plan:  Assessment: Low back pain, right third metacarpal pain, ,depression Plan: Decrease zoloft from 2 tables to 1.5 daily, discontinue voltaren, mobic with food, RTC if s/s get worse. Report to closet ED if experience any suicidal/homocidal ideations, teaching handout on diagnosis and treatment provided.

## 2011-06-28 ENCOUNTER — Other Ambulatory Visit: Payer: Self-pay | Admitting: Family

## 2011-07-16 ENCOUNTER — Other Ambulatory Visit: Payer: Self-pay | Admitting: *Deleted

## 2011-07-16 MED ORDER — METOPROLOL TARTRATE 50 MG PO TABS: 50.0000 mg | ORAL_TABLET | Freq: Every day | ORAL | Status: DC

## 2011-08-12 ENCOUNTER — Other Ambulatory Visit: Payer: Self-pay | Admitting: Internal Medicine

## 2011-09-13 ENCOUNTER — Other Ambulatory Visit: Payer: Self-pay | Admitting: Internal Medicine

## 2011-09-19 ENCOUNTER — Encounter: Payer: Self-pay | Admitting: Internal Medicine

## 2011-09-19 ENCOUNTER — Ambulatory Visit (INDEPENDENT_AMBULATORY_CARE_PROVIDER_SITE_OTHER): Payer: PRIVATE HEALTH INSURANCE | Admitting: Internal Medicine

## 2011-09-19 VITALS — BP 132/72 | HR 64 | Temp 98.3°F | Wt 149.0 lb

## 2011-09-19 DIAGNOSIS — W57XXXA Bitten or stung by nonvenomous insect and other nonvenomous arthropods, initial encounter: Secondary | ICD-10-CM

## 2011-09-19 DIAGNOSIS — R002 Palpitations: Secondary | ICD-10-CM | POA: Insufficient documentation

## 2011-09-19 DIAGNOSIS — I059 Rheumatic mitral valve disease, unspecified: Secondary | ICD-10-CM

## 2011-09-19 DIAGNOSIS — Z79890 Hormone replacement therapy (postmenopausal): Secondary | ICD-10-CM | POA: Insufficient documentation

## 2011-09-19 DIAGNOSIS — L282 Other prurigo: Secondary | ICD-10-CM

## 2011-09-19 DIAGNOSIS — R21 Rash and other nonspecific skin eruption: Secondary | ICD-10-CM | POA: Insufficient documentation

## 2011-09-19 DIAGNOSIS — T148XXA Other injury of unspecified body region, initial encounter: Secondary | ICD-10-CM

## 2011-09-19 MED ORDER — FLUOCINONIDE-E 0.05 % EX CREA: TOPICAL_CREAM | Freq: Two times a day (BID) | CUTANEOUS | Status: DC

## 2011-09-19 MED ORDER — MEDROXYPROGESTERONE ACETATE 5 MG PO TABS: 5.0000 mg | ORAL_TABLET | Freq: Every day | ORAL | Status: DC

## 2011-09-19 MED ORDER — METOPROLOL TARTRATE 50 MG PO TABS: 50.0000 mg | ORAL_TABLET | Freq: Every day | ORAL | Status: DC

## 2011-09-19 NOTE — Progress Notes (Signed)
Subjective:    Patient ID: Heather [name], female    DOB: 08/22/1945, 66 y.o.   MRN: [id number]  HPI Patient comes in today for SDA for  new problem evaluation. She states that she is having tick bites off and on over the last couple months from being outside she calls them very tiny look like tics under the microscope however over the last week or so she is having increasing rash bumps coming and going. It she not responsive to local care including an over-the-counter hydrocortisone has used alcohol other topicals. She now has an area in her left buttocks arms around her ankles. Her boyfriend also has similar rash on his back they both are outside a lot. Her pet dogs that time but they're not aware of having fleas they don't think they have fleas in the house she feels itching rashes like chiggers. She saw information on TV about Lyme disease and concern about tick bites also.   She needs a refill on her metoprolol for palpitations mitral valve prolapse  She would like a refill of her Provera which she takes cycling and gets withdrawal bleeding hormone replacement given by primary care..   Review of Systems Outpatient Encounter Prescriptions as of 09/19/2011  Medication Sig Dispense Refill  . Calcium Carbonate-Vitamin D (CALTRATE 600+D) 600-400 MG-UNIT per tablet Take 1 tablet by mouth daily.        Marland Kitchen estradiol (ESTRACE) 1 MG tablet Take 1 tablet (1 mg total) by mouth daily.    90 tablet  3  . loratadine (CLARITIN) 10 MG tablet Take 10 mg by mouth daily.        . metoprolol (LOPRESSOR) 50 MG tablet TAKE 1 TABLET BY MOUTH DAILY.  30 tablet  0  . Multiple Vitamin (MULTIVITAMIN) tablet Take 1 tablet by mouth daily.        . sertraline (ZOLOFT) 50 MG tablet TAKE 1 TABLET BY MOUTH DAILY  90 tablet  3  . medroxyPROGESTERone (PROVERA) 5 MG tablet Take 1 tablet (5 mg total) by mouth daily.  90 tablet  1  . sertraline (ZOLOFT) 50 MG tablet Take 100 mg by mouth daily. Take 1.5 tab daily      .  DISCONTD: fluticasone (FLONASE) 50 MCG/ACT nasal spray 2 sprays by Nasal route daily.  16 g  2  . DISCONTD: meloxicam (MOBIC) 15 MG tablet Take 1 tablet (15 mg total) by mouth daily.  30 tablet  3  . DISCONTD: sertraline (ZOLOFT) 50 MG tablet Take 2 tablets (100 mg total) by mouth daily.  30 tablet  3  . DISCONTD: sertraline (ZOLOFT) 50 MG tablet TAKE 2 TABLETS BY MOUTH EVERY DAY  60 tablet  2  . DISCONTD: vitamin E 400 UNIT capsule Take 400 Units by mouth daily.         Facility-Administered Encounter Medications as of 09/19/2011  Medication Dose Route Frequency Provider Last Rate Last Dose  . cefTRIAXone (ROCEPHIN) injection 1 g  1 g Intramuscular Q24H Baker Pierini, FNP   1 g at 03/28/11 1509   Past history family history social history reviewed in the electronic medical record. Retired lives with boyfriend outside a lot.     Objective:   Physical Exam  BP 132/72  Pulse 64  Temp 98.3 F (36.8 C) (Oral)  Wt 149 lb (67.586 kg)  SpO2 98%  LMP 09/09/2011 wdwn in nad  SKIN: Multiple pink papules without vesicles or burrows excoriated on the forearms a few on the  left buttocks single multiple round the ankles some on the trunk none between the fingers axilla or genital area. Face is clear.     Assessment & Plan:    Itchy rash persistent recurrent described multiple small tick bites that she pulls off sounds more like a might describes this after walking in the grass.[employer] Cake for her boyfriend also has the same seems to be related to being outside. This is not a typical distribution for scabies although the amount of itching is consistent with that. At this point in time on discussed options treatment local care and prevention. If persistent or progressive consider empiric treatment. Do not see any evidence of Lyme disease discussed symptoms of such.    Palpitation ;history we'll refill her metoprolol today for 2 months and she should follow up with her primary care  physician  Hormone replacement therapy is on oral therapy and using the Provera cycling taking days 1 through 12 and gets withdrawal bleeding. Prescribed 30 with refill x1 have her followup with her primary physician if she's continuing this therapy.  Total visit > 50% spent counseling and coordinating care

## 2011-09-19 NOTE — Patient Instructions (Addendum)
Agree this looks like insect bites Although tick bites are not as numerous as you described.   / if this could be non-burrowing mites also. Doesn't seem like scabies but that is a burrowing  Mite that can pass from person to person and related with an insecticide type   Medicine. There  For now plan  rx for itching.   Oatmeal bath  aveeno  Avoiding irritation to skin.  Benadryl oral ok for itching also. Consider  Insecticide on clothes when go out in grass.  No evidence of lyme disease  But if get bullseye other rash or fever contact our office.  We'll refill your 2 medications for 2 months and then had you should followup with Dr. Cato Mulligan before you run out of the medicines.

## 2011-10-28 ENCOUNTER — Other Ambulatory Visit: Payer: Self-pay | Admitting: Internal Medicine

## 2011-10-28 NOTE — Telephone Encounter (Signed)
appt made in November for CPX.  Gave enough to get through appt

## 2011-11-01 ENCOUNTER — Telehealth: Payer: Self-pay | Admitting: Internal Medicine

## 2011-11-01 ENCOUNTER — Other Ambulatory Visit: Payer: Self-pay | Admitting: Internal Medicine

## 2011-11-01 MED ORDER — SERTRALINE HCL 50 MG PO TABS: 100.0000 mg | ORAL_TABLET | Freq: Every day | ORAL | Status: DC

## 2011-11-01 NOTE — Telephone Encounter (Signed)
Patient seen by your in early 2012.  Has had acute visits with other providers.  Please advise medication refill.  Thanks!!

## 2011-11-01 NOTE — Telephone Encounter (Signed)
Pt called to check on status of refill of sertraline (ZOLOFT) 50 MG tablet [facility] [phone]. Pt was sch to come in for ov in Oct, but pt was rsc to Nov 12 because pcp was out of office. Pls call in enough meds to last until appt date in Nov.

## 2011-11-01 NOTE — Telephone Encounter (Signed)
Ok to refill 

## 2011-11-13 ENCOUNTER — Encounter: Payer: PRIVATE HEALTH INSURANCE | Admitting: Internal Medicine

## 2011-11-14 ENCOUNTER — Other Ambulatory Visit: Payer: Self-pay | Admitting: Internal Medicine

## 2011-11-19 ENCOUNTER — Encounter: Payer: PRIVATE HEALTH INSURANCE | Admitting: Internal Medicine

## 2011-12-10 ENCOUNTER — Encounter: Payer: Self-pay | Admitting: Internal Medicine

## 2011-12-10 ENCOUNTER — Ambulatory Visit (INDEPENDENT_AMBULATORY_CARE_PROVIDER_SITE_OTHER): Payer: PRIVATE HEALTH INSURANCE | Admitting: Internal Medicine

## 2011-12-10 VITALS — BP 152/82 | HR 84 | Temp 98.1°F | Ht 65.0 in | Wt 151.0 lb

## 2011-12-10 DIAGNOSIS — N3941 Urge incontinence: Secondary | ICD-10-CM

## 2011-12-10 DIAGNOSIS — Z0000 Encounter for general adult medical examination without abnormal findings: Secondary | ICD-10-CM

## 2011-12-10 DIAGNOSIS — Z79899 Other long term (current) drug therapy: Secondary | ICD-10-CM

## 2011-12-10 DIAGNOSIS — F329 Major depressive disorder, single episode, unspecified: Secondary | ICD-10-CM

## 2011-12-10 DIAGNOSIS — F39 Unspecified mood [affective] disorder: Secondary | ICD-10-CM

## 2011-12-10 DIAGNOSIS — M6530 Trigger finger, unspecified finger: Secondary | ICD-10-CM

## 2011-12-10 DIAGNOSIS — M549 Dorsalgia, unspecified: Secondary | ICD-10-CM

## 2011-12-10 DIAGNOSIS — Z1231 Encounter for screening mammogram for malignant neoplasm of breast: Secondary | ICD-10-CM

## 2011-12-10 LAB — BASIC METABOLIC PANEL
BUN: 14 mg/dL (ref 6–23)
CO2: 23 mEq/L (ref 19–32)
Chloride: 105 mEq/L (ref 96–112)
Glucose, Bld: 104 mg/dL — ABNORMAL HIGH (ref 70–99)
Potassium: 4.1 mEq/L (ref 3.5–5.1)

## 2011-12-10 LAB — POCT URINALYSIS DIPSTICK
Glucose, UA: NEGATIVE
Leukocytes, UA: NEGATIVE
Nitrite, UA: NEGATIVE
Urobilinogen, UA: 0.2

## 2011-12-10 LAB — CBC WITH DIFFERENTIAL/PLATELET
Eosinophils Relative: 2.9 % (ref 0.0–5.0)
HCT: 38.7 % (ref 36.0–46.0)
Hemoglobin: 12.9 g/dL (ref 12.0–15.0)
Lymphs Abs: 1.3 10*3/uL (ref 0.7–4.0)
Monocytes Relative: 6 % (ref 3.0–12.0)
Neutro Abs: 5.1 10*3/uL (ref 1.4–7.7)
RDW: 13.1 % (ref 11.5–14.6)
WBC: 7.1 10*3/uL (ref 4.5–10.5)

## 2011-12-10 LAB — HEPATIC FUNCTION PANEL
ALT: 17 U/L (ref 0–35)
AST: 15 U/L (ref 0–37)
Total Bilirubin: 0.5 mg/dL (ref 0.3–1.2)

## 2011-12-10 LAB — LDL CHOLESTEROL, DIRECT: Direct LDL: 125.1 mg/dL

## 2011-12-10 LAB — LIPID PANEL
Cholesterol: 239 mg/dL — ABNORMAL HIGH (ref 0–200)
Total CHOL/HDL Ratio: 3

## 2011-12-10 MED ORDER — MELOXICAM 7.5 MG PO TABS: 7.5000 mg | ORAL_TABLET | Freq: Every day | ORAL | Status: DC

## 2011-12-10 NOTE — Progress Notes (Signed)
Patient ID: Heather [name], female   DOB: Sep 27, 1945, 66 y.o.   MRN: [id number] Here for cpx In addition she has multiple othe complaints--  1)back pain at rtest for the past 1 + years- no radiation of pain  2) - right 4th finger "gets stuck" getting worse over the past 2 years  3) bladder- she has urinary urgency and frequency-- occasional incontinence  4)- tneder area Right distal nares-- can be red  5) when she walks she can feel some tightness of quadriceps.  Past Medical History  Diagnosis Date  . MVP (mitral valve prolapse)   . Hypertension     History   Social History  . Marital Status: Divorced    Spouse Name: N/A    Number of Children: N/A  . Years of Education: N/A   Occupational History  . Not on file.   Social History Main Topics  . Smoking status: Never Smoker   . Smokeless tobacco: Not on file  . Alcohol Use: Yes  . Drug Use: No  . Sexually Active:    Other Topics Concern  . Not on file   Social History Narrative  . No narrative on file    Past Surgical History  Procedure Date  . Tubal ligation   . Tibia fracture surgery     Family History  Problem Relation Age of Onset  . Osteoporosis Mother   . Heart disease Mother   . Diabetes Father   . Heart attack Father     Allergies  Allergen Reactions  . Sulfamethoxazole     REACTION: unspecified    Current Outpatient Prescriptions on File Prior to Visit  Medication Sig Dispense Refill  . Calcium Carbonate-Vitamin D (CALTRATE 600+D) 600-400 MG-UNIT per tablet Take 1 tablet by mouth daily.        Marland Kitchen estradiol (ESTRACE) 1 MG tablet TAKE ONE TABLET BY MOUTH EVERY DAY  90 tablet  0  . fluocinonide-emollient (LIDEX-E) 0.05 % cream Apply topically 2 (two) times daily. On itchy places do not use on face  30 g  1  . loratadine (CLARITIN) 10 MG tablet Take 10 mg by mouth daily.        . medroxyPROGESTERone (PROVERA) 5 MG tablet Take 1 tablet (5 mg total) by mouth daily. Day 1-12 or as directed  30  tablet  1  . metoprolol (LOPRESSOR) 50 MG tablet TAKE 1 TABLET BY MOUTH DAILY FOR PALPITATIONS  30 tablet  1  . Multiple Vitamin (MULTIVITAMIN) tablet Take 1 tablet by mouth daily.        . [DISCONTINUED] sertraline (ZOLOFT) 50 MG tablet Take 2 tablets (100 mg total) by mouth daily.  60 tablet  1  . [DISCONTINUED] metoprolol (LOPRESSOR) 50 MG tablet TAKE 1 TABLET BY MOUTH DAILY FOR PALPITATIONS  30 tablet  1  . [DISCONTINUED] sertraline (ZOLOFT) 50 MG tablet TAKE 1 TABLET BY MOUTH DAILY  90 tablet  3  . [DISCONTINUED] sertraline (ZOLOFT) 50 MG tablet Take 100 mg by mouth daily. Take 1.5 tab daily       Current Facility-Administered Medications on File Prior to Visit  Medication Dose Route Frequency Provider Last Rate Last Dose  . cefTRIAXone (ROCEPHIN) injection 1 g  1 g Intramuscular Q24H Baker Pierini, FNP   1 g at 03/28/11 1509     patient denies chest pain, shortness of breath, orthopnea. Denies lower extremity edema, abdominal pain, change in appetite, change in bowel movements. Patient denies rashes, musculoskeletal complaints. No other  specific complaints in a complete review of systems.   BP 152/82  Pulse 84  Temp 98.1 F (36.7 C) (Oral)  Ht 5\' 5"  (1.651 m)  Wt 151 lb (68.493 kg)  BMI 25.13 kg/m2  Well-developed well-nourished female in no acute distress. HEENT exam atraumatic, normocephalic, extraocular muscles are intact. Neck is supple. No jugular venous distention no thyromegaly. Chest clear to auscultation without increased work of breathing. Cardiac exam S1 and S2 are regular. Abdominal exam active bowel sounds, soft, nontender. Extremities no edema. Neurologic exam she is alert without any motor sensory deficits. Gait is normal.   A/P-- well visit- health maint UTD  Back pain-- refer to PT-- no concerning s/s  Trigger finger-- refer hand surgeon  Tenderness thighs-- no concern, noi evaluation

## 2011-12-11 DIAGNOSIS — F419 Anxiety disorder, unspecified: Secondary | ICD-10-CM | POA: Insufficient documentation

## 2011-12-12 ENCOUNTER — Ambulatory Visit: Payer: PRIVATE HEALTH INSURANCE | Attending: Internal Medicine

## 2011-12-12 DIAGNOSIS — M2569 Stiffness of other specified joint, not elsewhere classified: Secondary | ICD-10-CM | POA: Insufficient documentation

## 2011-12-12 DIAGNOSIS — IMO0001 Reserved for inherently not codable concepts without codable children: Secondary | ICD-10-CM | POA: Insufficient documentation

## 2011-12-12 DIAGNOSIS — M545 Low back pain, unspecified: Secondary | ICD-10-CM | POA: Insufficient documentation

## 2011-12-12 DIAGNOSIS — R5381 Other malaise: Secondary | ICD-10-CM | POA: Insufficient documentation

## 2011-12-17 ENCOUNTER — Ambulatory Visit: Payer: PRIVATE HEALTH INSURANCE

## 2011-12-24 ENCOUNTER — Encounter: Payer: PRIVATE HEALTH INSURANCE | Admitting: Family Medicine

## 2011-12-31 ENCOUNTER — Ambulatory Visit: Payer: PRIVATE HEALTH INSURANCE | Attending: Internal Medicine

## 2011-12-31 DIAGNOSIS — R5381 Other malaise: Secondary | ICD-10-CM | POA: Insufficient documentation

## 2011-12-31 DIAGNOSIS — IMO0001 Reserved for inherently not codable concepts without codable children: Secondary | ICD-10-CM | POA: Insufficient documentation

## 2011-12-31 DIAGNOSIS — M545 Low back pain, unspecified: Secondary | ICD-10-CM | POA: Insufficient documentation

## 2012-01-02 ENCOUNTER — Ambulatory Visit (INDEPENDENT_AMBULATORY_CARE_PROVIDER_SITE_OTHER): Payer: PRIVATE HEALTH INSURANCE | Admitting: Obstetrics & Gynecology

## 2012-01-02 ENCOUNTER — Other Ambulatory Visit (HOSPITAL_COMMUNITY)
Admission: RE | Admit: 2012-01-02 | Discharge: 2012-01-02 | Disposition: A | Discharge status: Home / Self Care | Payer: PRIVATE HEALTH INSURANCE | Source: Ambulatory Visit | Attending: Obstetrics & Gynecology | Admitting: Obstetrics & Gynecology

## 2012-01-02 ENCOUNTER — Encounter: Payer: Self-pay | Admitting: Obstetrics & Gynecology

## 2012-01-02 VITALS — BP 138/52 | HR 75 | Ht 65.0 in | Wt 153.0 lb

## 2012-01-02 DIAGNOSIS — Z01419 Encounter for gynecological examination (general) (routine) without abnormal findings: Secondary | ICD-10-CM | POA: Insufficient documentation

## 2012-01-02 DIAGNOSIS — Z79890 Hormone replacement therapy (postmenopausal): Secondary | ICD-10-CM

## 2012-01-02 DIAGNOSIS — Z1151 Encounter for screening for human papillomavirus (HPV): Secondary | ICD-10-CM

## 2012-01-02 DIAGNOSIS — N3941 Urge incontinence: Secondary | ICD-10-CM

## 2012-01-02 DIAGNOSIS — Z124 Encounter for screening for malignant neoplasm of cervix: Secondary | ICD-10-CM | POA: Insufficient documentation

## 2012-01-02 MED ORDER — OXYBUTYNIN CHLORIDE ER 10 MG PO TB24: 10.0000 mg | ORAL_TABLET | Freq: Every day | ORAL | Status: DC

## 2012-01-02 NOTE — Progress Notes (Signed)
New patient here today pap smear.  Having some issues with bladder incontinence.

## 2012-01-02 NOTE — Progress Notes (Signed)
  Subjective:    Heather [name] is a 66 y.o. G1P1 female who presents for annual pap smear and to discuss hormone replacement therapy.  She reports that her previous gynecologist put her on Provera 5 mg po qd x 12 days every month since she was 44 years ago "to avoid symptoms of menopause".  She has continued this since then and has a withdrawal bleed every month.  She feels that having "her period" every months circumvents going thorough menopause. Her PCP and other MDs have refilled this medication over the years; she wants this office to take over prescribing this for her.  Patient also reports having urge incontinence for several years.  She wants to know how she can treat this condition. The patient is sexually active with no concerns  GYN screening history: last pap: was normal and last mammogram: was normal.   Current hormone therapy:  Progesterone: Medroxyprogesterone 5 mg day 1-12 of calendar month  The following portions of the patient's history were reviewed and updated as appropriate: allergies, current medications, past family history, past medical history, past social history, past surgical history and problem list.  Review of Systems Pertinent items are noted in HPI.    Objective:   BP 138/52  Pulse 75  Ht 5\' 5"  (1.651 m)  Wt 153 lb (69.4 kg)  BMI 25.46 kg/m2  LMP 12/09/2011 GENERAL: Well-developed, well-nourished female in no acute distress.  HEENT: Normocephalic, atraumatic. Sclerae anicteric.  NECK: Supple. Normal thyroid.  LUNGS: Clear to auscultation bilaterally.  HEART: Regular rate and rhythm. BREASTS: Symmetric in size. No masses, skin changes, nipple drainage, or lymphadenopathy. ABDOMEN: Soft, nontender, nondistended. No organomegaly. PELVIC: Normal external female genitalia. Vagina is pink and rugated.  Normal discharge. Normal cervix contour. Pap smear obtained. Uterus is normal in size. No adnexal mass or tenderness. No rectocele, cystocele or significant  pelvic organ prolapse noted. No leaking of urine on Valsalva. EXTREMITIES: No cyanosis, clubbing, or edema, 2+ distal pulses.   Assessment:   Normal gynecologic examination  Hormone replacement therapy in 66 y.o. woman.  Urge incontinence   Plan:  Pap done, will follow up results and manage accordingly. Mammogram scheduled, will follow up results and manage accordingly. Patient requests HRT. Risks and benefits of HRT, including recent evidence on HRT effects on breast cancer, cerebrovascular disease and heart disease, were discussed. Also emphasized that her current regimen is not an accepted form of HRT, and also her "periods" are just withdrawal bleeds. She was told she does not need Provera therapy anymore, unless she has known abnormal bleeding issues.  Patient is currently on day 5/12 on her Provera, she will stop it after this cycle and will watch out for any concerning signs or symptoms. Patient agrees with this plan. For her urge incontinence, prescribed Ditropan-XL 10 mg daily; will see if this helps her symptoms. If symptoms worsen, consider referral to Urology. Routine preventative health maintenance measures emphasized.

## 2012-01-02 NOTE — Patient Instructions (Signed)
Preventive Care for Adults, Female A healthy lifestyle and preventive care can promote health and wellness. Preventive health guidelines for women include the following key practices.  A routine yearly physical is a good way to check with your caregiver about your health and preventive screening. It is a chance to share any concerns and updates on your health, and to receive a thorough exam.  Visit your dentist for a routine exam and preventive care every 6 months. Brush your teeth twice a day and floss once a day. Good oral hygiene prevents tooth decay and gum disease.  The frequency of eye exams is based on your age, health, family medical history, use of contact lenses, and other factors. Follow your caregiver's recommendations for frequency of eye exams.  Eat a healthy diet. Foods like vegetables, fruits, whole grains, low-fat dairy products, and lean protein foods contain the nutrients you need without too many calories. Decrease your intake of foods high in solid fats, added sugars, and salt. Eat the right amount of calories for you.Get information about a proper diet from your caregiver, if necessary.  Regular physical exercise is one of the most important things you can do for your health. Most adults should get at least 150 minutes of moderate-intensity exercise (any activity that increases your heart rate and causes you to sweat) each week. In addition, most adults need muscle-strengthening exercises on 2 or more days a week.  Maintain a healthy weight. The body mass index (BMI) is a screening tool to identify possible weight problems. It provides an estimate of body fat based on height and weight. Your caregiver can help determine your BMI, and can help you achieve or maintain a healthy weight.For adults 20 years and older:  A BMI below 18.5 is considered underweight.  A BMI of 18.5 to 24.9 is normal.  A BMI of 25 to 29.9 is considered overweight.  A BMI of 30 and above is  considered obese.  Maintain normal blood lipids and cholesterol levels by exercising and minimizing your intake of saturated fat. Eat a balanced diet with plenty of fruit and vegetables. Blood tests for lipids and cholesterol should begin at age 20 and be repeated every 5 years. If your lipid or cholesterol levels are high, you are over 50, or you are at high risk for heart disease, you may need your cholesterol levels checked more frequently.Ongoing high lipid and cholesterol levels should be treated with medicines if diet and exercise are not effective.  If you smoke, find out from your caregiver how to quit. If you do not use tobacco, do not start.  If you are pregnant, do not drink alcohol. If you are breastfeeding, be very cautious about drinking alcohol. If you are not pregnant and choose to drink alcohol, do not exceed 1 drink per day. One drink is considered to be 12 ounces (355 mL) of beer, 5 ounces (148 mL) of wine, or 1.5 ounces (44 mL) of liquor.  Avoid use of street drugs. Do not share needles with anyone. Ask for help if you need support or instructions about stopping the use of drugs.  High blood pressure causes heart disease and increases the risk of stroke. Your blood pressure should be checked at least every 1 to 2 years. Ongoing high blood pressure should be treated with medicines if weight loss and exercise are not effective.  If you are 55 to 67 years old, ask your caregiver if you should take aspirin to prevent strokes.  Diabetes   screening involves taking a blood sample to check your fasting blood sugar level. This should be done once every 3 years, after age 45, if you are within normal weight and without risk factors for diabetes. Testing should be considered at a younger age or be carried out more frequently if you are overweight and have at least 1 risk factor for diabetes.  Breast cancer screening is essential preventive care for women. You should practice "breast  self-awareness." This means understanding the normal appearance and feel of your breasts and may include breast self-examination. Any changes detected, no matter how small, should be reported to a caregiver. Women in their 20s and 30s should have a clinical breast exam (CBE) by a caregiver as part of a regular health exam every 1 to 3 years. After age 40, women should have a CBE every year. Starting at age 40, women should consider having a mammography (breast X-ray test) every year. Women who have a family history of breast cancer should talk to their caregiver about genetic screening. Women at a high risk of breast cancer should talk to their caregivers about having magnetic resonance imaging (MRI) and a mammography every year.  The Pap test is a screening test for cervical cancer. A Pap test can show cell changes on the cervix that might become cervical cancer if left untreated. A Pap test is a procedure in which cells are obtained and examined from the lower end of the uterus (cervix).  Women should have a Pap test starting at age 21.  Between ages 21 and 29, Pap tests should be repeated every 2 years.  Beginning at age 30, you should have a Pap test every 3 years as long as the past 3 Pap tests have been normal.  Some women have medical problems that increase the chance of getting cervical cancer. Talk to your caregiver about these problems. It is especially important to talk to your caregiver if a new problem develops soon after your last Pap test. In these cases, your caregiver may recommend more frequent screening and Pap tests.  The above recommendations are the same for women who have or have not gotten the vaccine for human papillomavirus (HPV).  If you had a hysterectomy for a problem that was not cancer or a condition that could lead to cancer, then you no longer need Pap tests. Even if you no longer need a Pap test, a regular exam is a good idea to make sure no other problems are  starting.  If you are between ages 65 and 70, and you have had normal Pap tests going back 10 years, you no longer need Pap tests. Even if you no longer need a Pap test, a regular exam is a good idea to make sure no other problems are starting.  If you have had past treatment for cervical cancer or a condition that could lead to cancer, you need Pap tests and screening for cancer for at least 20 years after your treatment.  If Pap tests have been discontinued, risk factors (such as a new sexual partner) need to be reassessed to determine if screening should be resumed.  The HPV test is an additional test that may be used for cervical cancer screening. The HPV test looks for the virus that can cause the cell changes on the cervix. The cells collected during the Pap test can be tested for HPV. The HPV test could be used to screen women aged 30 years and older, and should   be used in women of any age who have unclear Pap test results. After the age of 30, women should have HPV testing at the same frequency as a Pap test.  Colorectal cancer can be detected and often prevented. Most routine colorectal cancer screening begins at the age of 50 and continues through age 75. However, your caregiver may recommend screening at an earlier age if you have risk factors for colon cancer. On a yearly basis, your caregiver may provide home test kits to check for hidden blood in the stool. Use of a small camera at the end of a tube, to directly examine the colon (sigmoidoscopy or colonoscopy), can detect the earliest forms of colorectal cancer. Talk to your caregiver about this at age 50, when routine screening begins. Direct examination of the colon should be repeated every 5 to 10 years through age 75, unless early forms of pre-cancerous polyps or small growths are found.  Hepatitis C blood testing is recommended for all people born from 1945 through 1965 and any individual with known risks for hepatitis C.  Practice  safe sex. Use condoms and avoid high-risk sexual practices to reduce the spread of sexually transmitted infections (STIs). STIs include gonorrhea, chlamydia, syphilis, trichomonas, herpes, HPV, and human immunodeficiency virus (HIV). Herpes, HIV, and HPV are viral illnesses that have no cure. They can result in disability, cancer, and death. Sexually active women aged 25 and younger should be checked for chlamydia. Older women with new or multiple partners should also be tested for chlamydia. Testing for other STIs is recommended if you are sexually active and at increased risk.  Osteoporosis is a disease in which the bones lose minerals and strength with aging. This can result in serious bone fractures. The risk of osteoporosis can be identified using a bone density scan. Women ages 65 and over and women at risk for fractures or osteoporosis should discuss screening with their caregivers. Ask your caregiver whether you should take a calcium supplement or vitamin D to reduce the rate of osteoporosis.  Menopause can be associated with physical symptoms and risks. Hormone replacement therapy is available to decrease symptoms and risks. You should talk to your caregiver about whether hormone replacement therapy is right for you.  Use sunscreen with sun protection factor (SPF) of 30 or more. Apply sunscreen liberally and repeatedly throughout the day. You should seek shade when your shadow is shorter than you. Protect yourself by wearing long sleeves, pants, a wide-brimmed hat, and sunglasses year round, whenever you are outdoors.  Once a month, do a whole body skin exam, using a mirror to look at the skin on your back. Notify your caregiver of new moles, moles that have irregular borders, moles that are larger than a pencil eraser, or moles that have changed in shape or color.  Stay current with required immunizations.  Influenza. You need a dose every fall (or winter). The composition of the flu vaccine  changes each year, so being vaccinated once is not enough.  Pneumococcal polysaccharide. You need 1 to 2 doses if you smoke cigarettes or if you have certain chronic medical conditions. You need 1 dose at age 65 (or older) if you have never been vaccinated.  Tetanus, diphtheria, pertussis (Tdap, Td). Get 1 dose of Tdap vaccine if you are younger than age 65, are over 65 and have contact with an infant, are a healthcare worker, are pregnant, or simply want to be protected from whooping cough. After that, you need a Td   booster dose every 10 years. Consult your caregiver if you have not had at least 3 tetanus and diphtheria-containing shots sometime in your life or have a deep or dirty wound.  HPV. You need this vaccine if you are a woman age 26 or younger. The vaccine is given in 3 doses over 6 months.  Measles, mumps, rubella (MMR). You need at least 1 dose of MMR if you were born in 1957 or later. You may also need a second dose.  Meningococcal. If you are age 19 to 21 and a first-year college student living in a residence hall, or have one of several medical conditions, you need to get vaccinated against meningococcal disease. You may also need additional booster doses.  Zoster (shingles). If you are age 60 or older, you should get this vaccine.  Varicella (chickenpox). If you have never had chickenpox or you were vaccinated but received only 1 dose, talk to your caregiver to find out if you need this vaccine.  Hepatitis A. You need this vaccine if you have a specific risk factor for hepatitis A virus infection or you simply wish to be protected from this disease. The vaccine is usually given as 2 doses, 6 to 18 months apart.  Hepatitis B. You need this vaccine if you have a specific risk factor for hepatitis B virus infection or you simply wish to be protected from this disease. The vaccine is given in 3 doses, usually over 6 months. Preventive Services / Frequency Ages 19 to 39  Blood  pressure check.** / Every 1 to 2 years.  Lipid and cholesterol check.** / Every 5 years beginning at age 20.  Clinical breast exam.** / Every 3 years for women in their 20s and 30s.  Pap test.** / Every 2 years from ages 21 through 29. Every 3 years starting at age 30 through age 65 or 70 with a history of 3 consecutive normal Pap tests.  HPV screening.** / Every 3 years from ages 30 through ages 65 to 70 with a history of 3 consecutive normal Pap tests.  Hepatitis C blood test.** / For any individual with known risks for hepatitis C.  Skin self-exam. / Monthly.  Influenza immunization.** / Every year.  Pneumococcal polysaccharide immunization.** / 1 to 2 doses if you smoke cigarettes or if you have certain chronic medical conditions.  Tetanus, diphtheria, pertussis (Tdap, Td) immunization. / A one-time dose of Tdap vaccine. After that, you need a Td booster dose every 10 years.  HPV immunization. / 3 doses over 6 months, if you are 26 and younger.  Measles, mumps, rubella (MMR) immunization. / You need at least 1 dose of MMR if you were born in 1957 or later. You may also need a second dose.  Meningococcal immunization. / 1 dose if you are age 19 to 21 and a first-year college student living in a residence hall, or have one of several medical conditions, you need to get vaccinated against meningococcal disease. You may also need additional booster doses.  Varicella immunization.** / Consult your caregiver.  Hepatitis A immunization.** / Consult your caregiver. 2 doses, 6 to 18 months apart.  Hepatitis B immunization.** / Consult your caregiver. 3 doses usually over 6 months. Ages 40 to 64  Blood pressure check.** / Every 1 to 2 years.  Lipid and cholesterol check.** / Every 5 years beginning at age 20.  Clinical breast exam.** / Every year after age 40.  Mammogram.** / Every year beginning at age 40   and continuing for as long as you are in good health. Consult with your  caregiver.  Pap test.** / Every 3 years starting at age 30 through age 65 or 70 with a history of 3 consecutive normal Pap tests.  HPV screening.** / Every 3 years from ages 30 through ages 65 to 70 with a history of 3 consecutive normal Pap tests.  Fecal occult blood test (FOBT) of stool. / Every year beginning at age 50 and continuing until age 75. You may not need to do this test if you get a colonoscopy every 10 years.  Flexible sigmoidoscopy or colonoscopy.** / Every 5 years for a flexible sigmoidoscopy or every 10 years for a colonoscopy beginning at age 50 and continuing until age 75.  Hepatitis C blood test.** / For all people born from 1945 through 1965 and any individual with known risks for hepatitis C.  Skin self-exam. / Monthly.  Influenza immunization.** / Every year.  Pneumococcal polysaccharide immunization.** / 1 to 2 doses if you smoke cigarettes or if you have certain chronic medical conditions.  Tetanus, diphtheria, pertussis (Tdap, Td) immunization.** / A one-time dose of Tdap vaccine. After that, you need a Td booster dose every 10 years.  Measles, mumps, rubella (MMR) immunization. / You need at least 1 dose of MMR if you were born in 1957 or later. You may also need a second dose.  Varicella immunization.** / Consult your caregiver.  Meningococcal immunization.** / Consult your caregiver.  Hepatitis A immunization.** / Consult your caregiver. 2 doses, 6 to 18 months apart.  Hepatitis B immunization.** / Consult your caregiver. 3 doses, usually over 6 months. Ages 65 and over  Blood pressure check.** / Every 1 to 2 years.  Lipid and cholesterol check.** / Every 5 years beginning at age 20.  Clinical breast exam.** / Every year after age 40.  Mammogram.** / Every year beginning at age 40 and continuing for as long as you are in good health. Consult with your caregiver.  Pap test.** / Every 3 years starting at age 30 through age 65 or 70 with a 3  consecutive normal Pap tests. Testing can be stopped between 65 and 70 with 3 consecutive normal Pap tests and no abnormal Pap or HPV tests in the past 10 years.  HPV screening.** / Every 3 years from ages 30 through ages 65 or 70 with a history of 3 consecutive normal Pap tests. Testing can be stopped between 65 and 70 with 3 consecutive normal Pap tests and no abnormal Pap or HPV tests in the past 10 years.  Fecal occult blood test (FOBT) of stool. / Every year beginning at age 50 and continuing until age 75. You may not need to do this test if you get a colonoscopy every 10 years.  Flexible sigmoidoscopy or colonoscopy.** / Every 5 years for a flexible sigmoidoscopy or every 10 years for a colonoscopy beginning at age 50 and continuing until age 75.  Hepatitis C blood test.** / For all people born from 1945 through 1965 and any individual with known risks for hepatitis C.  Osteoporosis screening.** / A one-time screening for women ages 65 and over and women at risk for fractures or osteoporosis.  Skin self-exam. / Monthly.  Influenza immunization.** / Every year.  Pneumococcal polysaccharide immunization.** / 1 dose at age 65 (or older) if you have never been vaccinated.  Tetanus, diphtheria, pertussis (Tdap, Td) immunization. / A one-time dose of Tdap vaccine if you are over   65 and have contact with an infant, are a healthcare worker, or simply want to be protected from whooping cough. After that, you need a Td booster dose every 10 years.  Varicella immunization.** / Consult your caregiver.  Meningococcal immunization.** / Consult your caregiver.  Hepatitis A immunization.** / Consult your caregiver. 2 doses, 6 to 18 months apart.  Hepatitis B immunization.** / Check with your caregiver. 3 doses, usually over 6 months. ** Family history and personal history of risk and conditions may change your caregiver's recommendations. Document Released: 03/12/2001 Document Revised: 04/08/2011  Document Reviewed: 06/11/2010 [employer] Patient Information 2013 [employer].  

## 2012-01-07 ENCOUNTER — Ambulatory Visit: Payer: PRIVATE HEALTH INSURANCE

## 2012-01-09 ENCOUNTER — Ambulatory Visit (HOSPITAL_COMMUNITY)
Admission: RE | Admit: 2012-01-09 | Discharge: 2012-01-09 | Disposition: A | Discharge status: Home / Self Care | Payer: PRIVATE HEALTH INSURANCE | Source: Ambulatory Visit | Attending: Internal Medicine | Admitting: Internal Medicine

## 2012-01-09 DIAGNOSIS — Z1231 Encounter for screening mammogram for malignant neoplasm of breast: Secondary | ICD-10-CM | POA: Insufficient documentation

## 2012-01-14 ENCOUNTER — Other Ambulatory Visit: Payer: Self-pay | Admitting: Internal Medicine

## 2012-02-27 ENCOUNTER — Other Ambulatory Visit: Payer: Self-pay | Admitting: Internal Medicine

## 2012-03-17 ENCOUNTER — Other Ambulatory Visit: Payer: Self-pay | Admitting: Internal Medicine

## 2012-05-04 ENCOUNTER — Other Ambulatory Visit: Payer: Self-pay | Admitting: Internal Medicine

## 2012-05-19 ENCOUNTER — Other Ambulatory Visit: Payer: Self-pay | Admitting: Internal Medicine

## 2012-06-15 ENCOUNTER — Encounter: Payer: Self-pay | Admitting: Family Medicine

## 2012-06-15 ENCOUNTER — Ambulatory Visit (INDEPENDENT_AMBULATORY_CARE_PROVIDER_SITE_OTHER): Payer: PRIVATE HEALTH INSURANCE | Admitting: Family Medicine

## 2012-06-15 DIAGNOSIS — R109 Unspecified abdominal pain: Secondary | ICD-10-CM

## 2012-06-15 DIAGNOSIS — R319 Hematuria, unspecified: Secondary | ICD-10-CM

## 2012-06-15 DIAGNOSIS — N950 Postmenopausal bleeding: Secondary | ICD-10-CM

## 2012-06-15 LAB — POCT URINALYSIS DIPSTICK
Glucose, UA: NEGATIVE
Ketones, UA: NEGATIVE
Spec Grav, UA: 1.01

## 2012-06-15 NOTE — Patient Instructions (Addendum)
Postmenopausal Bleeding Menopause is commonly referred to as the "change in life." It is a time when the fertile years, the time of ovulating and having menstrual periods, has come to an end. It is also determined by not having menstrual periods for 12 months.  Postmenopausal bleeding is any bleeding a woman has after she has entered into menopause. Any type of postmenopausal bleeding, even if it appears to be a typical menstrual period, is concerning. This should be evaluated by your caregiver.  CAUSES   Hormone therapy.  Cancer of the cervix or cancer of the lining of the uterus (endometrial cancer).  Thinning of the uterine lining (uterine atrophy).  Thyroid diseases.  Certain medicines.  Infection of the uterus or cervix.  Inflammation or irritation of the uterine lining (endometritis).  Estrogen-secreting tumors.  Growths (polyps) on the cervix, uterine lining, or uterus.  Uterine tumors (fibroids).  Being very overweight (obese). DIAGNOSIS  Your caregiver will take a medical history and ask questions. A physical exam will also be performed. Further tests may include:   A transvaginal ultrasound. An ultrasound wand or probe is inserted into your vagina to view the pelvic organs.  A biopsy of the lining of the uterus (endometrium). A sample of the endometrium is removed and examined.  A hysteroscopy. Your caregiver may use an instrument with a light and a camera attached to it (hysteroscope). The hysteroscope is used to look inside the uterus for problems.  A dilation and curettage (D&C). Tissue is removed from the uterine lining to be examined for problems. TREATMENT  Treatment depends on the cause of the bleeding. Some treatments include:   Surgery.  Medicines.  Hormones.  A hysteroscopy or D&C to remove polyps or fibroids.  Changing or stopping a current medicine you are taking. Talk to your caregiver about your specific treatment. HOME CARE INSTRUCTIONS    Maintain a healthy weight.  Keep regular pelvic exams and Pap tests. SEEK MEDICAL CARE IF:   You have bleeding, even if it is light in comparison to your previous periods.  Your bleeding lasts more than 1 week.  You have abdominal pain.  You develop bleeding with sexual intercourse. SEEK IMMEDIATE MEDICAL CARE IF:   You have a fever, chills, headache, dizziness, muscle aches, and bleeding.  You have severe pain with bleeding.  You are passing blood clots.  You have bleeding and need more than 1 pad an hour.  You feel faint. MAKE SURE YOU:  Understand these instructions.  Will watch your condition.  Will get help right away if you are not doing well or get worse. Document Released: 04/24/2005 Document Revised: 04/08/2011 Document Reviewed: 09/20/2010 [employer] Patient Information 2013 [employer]. Endometrial Biopsy This is a test in which a tissue sample (a biopsy) is taken from inside the uterus (womb). It is then looked at by a specialist under a microscope to see if the tissue is normal or abnormal. The endometrium is the lining of the uterus. This test helps determine where you are in your menstrual cycle and how hormone levels are affecting the lining of the uterus. Another use for this test is to diagnose endometrial cancer, tuberculosis, polyps, or inflammatory conditions and to evaluate uterine bleeding. PREPARATION FOR TEST No preparation or fasting is necessary. NORMAL FINDINGS No pathologic conditions. Presence of "secretory-type" endometrium 3 to 5 days before to normal menstruation. Ranges for normal findings may vary among different laboratories and hospitals. You should always check with your doctor after having lab work   or other tests done to discuss the meaning of your test results and whether your values are considered within normal limits. MEANING OF TEST  Your caregiver will go over the test results with you and discuss the importance and meaning of  your results, as well as treatment options and the need for additional tests if necessary. OBTAINING THE TEST RESULTS It is your responsibility to obtain your test results. Ask the lab or department performing the test when and how you will get your results. Document Released: 05/17/2004 Document Revised: 04/08/2011 Document Reviewed: 12/25/2007 [employer] Patient Information 2013 [employer].  

## 2012-06-16 NOTE — Progress Notes (Signed)
  Subjective:    Patient ID: Heather [name], female    DOB: 1945-10-07, 67 y.o.   MRN: [id number]  HPI  Pt. With complicated history.  Appears to have been cycling with HRT and provera since menopause.  At last annual, was told to stop provera.  She did not understand to stop all HRT and has been on unopposed estrogen since 12/13.  She has now had one month of lower abdominal pain and new onset postmenopausal bleeding, that may be brown or bright red.  Review of Systems  Respiratory: Negative for cough and shortness of breath.   Cardiovascular: Negative for chest pain.  Musculoskeletal: Negative for arthralgias.       Objective:   Physical Exam  Vitals reviewed. Constitutional: She appears well-developed and well-nourished.  Cardiovascular: Normal rate and regular rhythm.   Pulmonary/Chest: Effort normal and breath sounds normal.  Abdominal: Soft. Bowel sounds are normal. There is no tenderness.  Genitourinary:  Uterus is 6-8 wk size, mobile, there is blood in the vault.  Cervix is without lesion.  Musculoskeletal: Normal range of motion.   Procedure: Patient given informed consent, signed copy in the chart, time out was performed. Appropriate time out taken. . The patient was placed in the lithotomy position and the cervix brought into view with sterile speculum.  Portio of cervix cleansed x 2 with betadine swabs.  A tenaculum was placed in the anterior lip of the cervix.  An os finder was needed. The uterus was sounded for depth of 7cm. A pipelle was introduced to into the uterus, suction created,  and an endometrial sample was obtained. All equipment was removed and accounted for.  The patient tolerated the procedure well.    Patient given post procedure instructions. The patient will return in 1 weeks for results.         Assessment & Plan:

## 2012-06-16 NOTE — Assessment & Plan Note (Signed)
Advised to discontinue all estrogen.  EMB today and u/s.  Will see her back in one week for f/u.

## 2012-06-18 LAB — URINE CULTURE
Colony Count: NO GROWTH
Organism ID, Bacteria: NO GROWTH

## 2012-06-23 ENCOUNTER — Encounter: Payer: PRIVATE HEALTH INSURANCE | Admitting: Family Medicine

## 2012-06-23 ENCOUNTER — Other Ambulatory Visit: Payer: Self-pay | Admitting: Internal Medicine

## 2012-06-24 ENCOUNTER — Telehealth: Payer: Self-pay | Admitting: *Deleted

## 2012-06-24 NOTE — Telephone Encounter (Signed)
Patient notified of all of her test results, she is scheduled for her ultrasound tomorrow and will call after to set up a follow up appointment if it is necessary.

## 2012-06-25 ENCOUNTER — Other Ambulatory Visit: Payer: Self-pay | Admitting: Family Medicine

## 2012-06-25 ENCOUNTER — Ambulatory Visit (HOSPITAL_COMMUNITY)
Admission: RE | Admit: 2012-06-25 | Discharge: 2012-06-25 | Disposition: A | Discharge status: Home / Self Care | Payer: PRIVATE HEALTH INSURANCE | Source: Ambulatory Visit | Attending: Family Medicine | Admitting: Family Medicine

## 2012-06-25 DIAGNOSIS — R1031 Right lower quadrant pain: Secondary | ICD-10-CM | POA: Insufficient documentation

## 2012-06-25 DIAGNOSIS — N950 Postmenopausal bleeding: Secondary | ICD-10-CM

## 2012-08-17 ENCOUNTER — Ambulatory Visit (INDEPENDENT_AMBULATORY_CARE_PROVIDER_SITE_OTHER): Payer: PRIVATE HEALTH INSURANCE | Admitting: Obstetrics & Gynecology

## 2012-08-17 ENCOUNTER — Encounter: Payer: Self-pay | Admitting: Obstetrics & Gynecology

## 2012-08-17 VITALS — BP 151/81 | HR 101 | Resp 18 | Ht 65.0 in | Wt 146.0 lb

## 2012-08-17 DIAGNOSIS — Z139 Encounter for screening, unspecified: Secondary | ICD-10-CM

## 2012-08-17 DIAGNOSIS — N390 Urinary tract infection, site not specified: Secondary | ICD-10-CM

## 2012-08-17 LAB — POCT URINALYSIS DIPSTICK
Ketones, UA: NEGATIVE
Spec Grav, UA: <=1.005
Urobilinogen, UA: 0.2
pH, UA: 6.5

## 2012-08-17 MED ORDER — FLAVOXATE HCL 100 MG PO TABS: 100.0000 mg | ORAL_TABLET | Freq: Three times a day (TID) | ORAL | Status: DC | PRN

## 2012-08-17 MED ORDER — CIPROFLOXACIN HCL 500 MG PO TABS: 500.0000 mg | ORAL_TABLET | Freq: Two times a day (BID) | ORAL | Status: DC

## 2012-08-17 NOTE — Progress Notes (Signed)
  Subjective:    Patient ID: Heather [name], female    DOB: 06-08-1945, 67 y.o.   MRN: [id number]  HPI  Heather [name] is here today with a burning sensation with urination. This is her second UTI in 20 years. She is quite miserable.  Review of Systems     Objective:   Physical Exam  No CVAT UA indicative of a UTI      Assessment & Plan:  UC&S Urispas and Cipro

## 2012-08-19 LAB — CULTURE, URINE COMPREHENSIVE

## 2012-08-21 ENCOUNTER — Ambulatory Visit (INDEPENDENT_AMBULATORY_CARE_PROVIDER_SITE_OTHER): Payer: PRIVATE HEALTH INSURANCE | Admitting: Obstetrics & Gynecology

## 2012-08-21 ENCOUNTER — Encounter: Payer: Self-pay | Admitting: Obstetrics & Gynecology

## 2012-08-21 VITALS — BP 163/73 | HR 69 | Ht 65.0 in | Wt 146.0 lb

## 2012-08-21 DIAGNOSIS — A6000 Herpesviral infection of urogenital system, unspecified: Secondary | ICD-10-CM

## 2012-08-21 HISTORY — DX: Herpesviral infection of urogenital system, unspecified: A60.00

## 2012-08-21 MED ORDER — LIDOCAINE HCL 2 % EX GEL: Freq: Three times a day (TID) | CUTANEOUS | Status: DC

## 2012-08-21 MED ORDER — HYDROCODONE-ACETAMINOPHEN 5-325 MG PO TABS: 1.0000 | ORAL_TABLET | ORAL | Status: DC | PRN

## 2012-08-21 MED ORDER — VALACYCLOVIR HCL 1 G PO TABS: 1000.0000 mg | ORAL_TABLET | Freq: Two times a day (BID) | ORAL | Status: DC

## 2012-08-21 MED ORDER — PROMETHAZINE HCL 12.5 MG PO TABS: 12.5000 mg | ORAL_TABLET | Freq: Four times a day (QID) | ORAL | Status: DC | PRN

## 2012-08-21 NOTE — Patient Instructions (Addendum)
Cold Sore  A cold sore (fever blister) is a skin infection caused by the herpes simplex virus (HSV-1). HSV-1 is closely related to the virus that causes gential herpes (HSV-2), but they are not the same even though both viruses can cause oral and genital infections. Cold sores are small, fluid-filled sores inside of the mouth or on the lips, gums, nose, chin, cheeks, or fingers.   The herpes simplex virus can be easily passed (contagious) to other people through close personal contact, such as kissing or sharing personal items. The virus can also spread to other parts of the body, such as the eyes or genitals. Cold sores are contagious until the sores crust over completely. They often heal within 2 weeks.   Once a person is infected, the herpes simplex virus remains permanently in the body. Therefore, there is no cure for cold sores, and they often recur when a person is tired, stressed, sick, or gets too much sun. Additional factors that can cause a recurrence include hormone changes in menstruation or pregnancy, certain drugs, and cold weather.   CAUSES   Cold sores are caused by the herpes simplex virus. The virus is spread from person to person through close contact, such as through kissing, touching the affected area, or sharing personal items such as lip balm, razors, or eating utensils.   SYMPTOMS   The first infection may not cause symptoms. If symptoms develop, the symptoms often go through different stages. Here is how a cold sore develops:   · Tingling, itching, or burning is felt 1 2 days before the outbreak.    · Fluid-filled blisters appear on the lips, inside the mouth, nose, or on the cheeks.    · The blisters start to ooze clear fluid.    · The blisters dry up and a yellow crust appears in its place.    · The crust falls off.    Symptoms depend on whether it is the initial outbreak or a recurrence. Some other symptoms with the first outbreak may include:   · Fever.    · Sore throat.    · Headache.     · Muscle aches.    · Swollen neck glands.    DIAGNOSIS   A diagnosis is often made based on your symptoms and looking at the sores. Sometimes, a sore may be swabbed and then examined in the lab to make a final diagnosis. If the sores are not present, blood tests can find the herpes simplex virus.   TREATMENT   There is no cure for cold sores and no vaccine for the herpes simplex virus. Within 2 weeks, most cold sores go away on their own without treatment. Medicines cannot make the infection go away, but medicine can help relieve some of the pain associated with the sores, can work to stop the virus from multiplying, and can also shorten healing time. Medicine may be in the form of creams, gels, pills, or a shot.   HOME CARE INSTRUCTIONS   · Only take over-the-counter or prescription medicines for pain, discomfort, or fever as directed by your caregiver. Do not use aspirin.    · Use a cotton-tip swab to apply creams or gels to your sores.    · Do not touch the sores or pick the scabs. Wash your hands often. Do not touch your eyes without washing your hands first.    · Avoid kissing, oral sex, and sharing personal items until sores heal.    · Apply an ice pack on your sores for 10 15 minutes to ease any   to drink if you have pain when drinking out of a glass.   Keep sores clean and dry to prevent an infection of other tissues.   Avoid the sun and limit stress if these things trigger outbreaks. If sun causes cold sores, apply sunscreen on the lips before being out in the sun.  SEEK MEDICAL CARE IF:   You have a fever or persistent symptoms for more than 2 3 days.   You have a fever and your symptoms suddenly get worse.   You have pus, not clear fluid, coming from the sores.   You have redness that is spreading.   You have pain or irritation in your  eye.   You get sores on your genitals.   Your sores do not heal within 2 weeks.   You have a weakened immune system.   You have frequent recurrences of cold sores.  MAKE SURE YOU:   Understand these instructions.  Will watch your condition.  Will get help right away if you are not doing well or get worse. Document Released: 01/12/2000 Document Revised: 10/09/2011 Document Reviewed: 05/29/2011 [facility] Patient Information 2014 [place], [place].   Genital Herpes Genital herpes is a sexually transmitted disease. This means that it is a disease passed by having sex with an infected person. There is no cure for genital herpes. The time between attacks can be months to years. The virus may live in a person but produce no problems (symptoms). This infection can be passed to a baby as it travels down the birth canal (vagina). In a newborn, this can cause central nervous system damage, eye damage, or even death. The virus that causes genital herpes is usually HSV-2 virus. The virus that causes oral herpes is usually HSV-1. The diagnosis (learning what is wrong) is made through culture results. SYMPTOMS  Usually symptoms of pain and itching begin a few days to a week after contact. It first appears as small blisters that progress to small painful ulcers which then scab over and heal after several days. It affects the outer genitalia, birth canal, cervix, penis, anal area, buttocks, and thighs. HOME CARE INSTRUCTIONS   Keep ulcerated areas dry and clean.  Take medications as directed. Antiviral medications can speed up healing. They will not prevent recurrences or cure this infection. These medications can also be taken for suppression if there are frequent recurrences.  While the infection is active, it is contagious. Avoid all sexual contact during active infections.  Condoms may help prevent spread of the herpes virus.  Practice safe sex.  Wash your hands thoroughly after touching the  genital area.  Avoid touching your eyes after touching your genital area.  Inform your caregiver if you have had genital herpes and become pregnant. It is your responsibility to insure a safe outcome for your baby in this pregnancy.  Only take over-the-counter or prescription medicines for pain, discomfort, or fever as directed by your caregiver. SEEK MEDICAL CARE IF:   You have a recurrence of this infection.  You do not respond to medications and are not improving.  You have new sources of pain or discharge which have changed from the original infection.  You have an oral temperature above 102 F (38.9 C).  You develop abdominal pain.  You develop eye pain or signs of eye infection. Document Released: 01/12/2000 Document Revised: 04/08/2011 Document Reviewed: 02/01/2009 [facility] Patient Information 2014 [place], [place].

## 2012-08-21 NOTE — Progress Notes (Addendum)
GYNECOLOGY CLINIC PROGRESS NOTE  History:  67 y.o. PMP female here today for evaluation of blisters in her genital area. She started having pain a few days ago and thought it was UTI; she was given Ciprofloxacin. Since then the pain worsened and the blister appeared and make it very difficult to use the bathroom.  She denies any history of HSV or other STIs, but does report that her husband had fever blisters last week and they participated in oral sex.   The following portions of the patient's history were reviewed and updated as appropriate: allergies, current medications, past family history, past medical history, past social history, past surgical history and problem list. Mammogram in 01/10/12 was normal, normal pap smear and negative HRHPV in 01/02/12.  Review of Systems:  Pertinent items are noted in HPI.  Objective:  Physical Exam BP 163/73  Pulse 69  Ht 5\' 5"  (1.651 m)  Wt 146 lb (66.225 kg)  BMI 24.3 kg/m2  LMP 12/29/2011 Gen: NAD Abd: Soft, nontender and nondistended Pelvic: Multiple erythematous ulcerated lesions all over vulva, extending to clitoral hood and down to rectum. Some lesions are blisters with pus on the tops of them. Some lesions are unroofed and have dried up purulent exudate.  Sample was take from an unroofed large ulcer and sent for HSV culture.  Very tender to palpation. Speculum exam deferred.  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  Patient was very emotional, did not know that HSV can be transmitted this way, did not know that HSV is the cause of cold sores/fever blisters.  She was given written information to review at home with her husband, emphasized that condoms can help reduce genital-to-genital transmission but it is not 100% perfect.  All questions were answered.  She kept saying " I have been careful all my life. I am 67 years old with an STD; I am a  statistic".  She was assured that STIs are diagnosed in a range of ages with sexually active individuals.  There are many sexually active people 68 around her age so they still have a risk of contracting STIs.  For females, the vulva and vagina become atrophic and more friable which can lead to small lacerations due to the friction during intercourse.  These lacerations unfortunately permit the passage of the virus from one individual to another.   Discussed that there is no cure for HSV, but outbreaks can be treated and some patients can take suppression therapy.  Valacyclovir 1g po bid x 10 days was prescribed; also prescribed oral and topical analgesia.  Offered in-hospital admission for a couple of days so that a urinary catheter can be placed but she declined this as she has to take care of her mother.  Advised her to spray water with a bottle (pericare) during urination and this may help alleviate the dysuria.  Strongly advised her to come to hospital for fevers or worsening symptoms as she may need IV antiviral therapy.  Will follow up results of HSV culture but the presentation and route of transmission is consistent with herpes genitalis, even if it the culture returns as negative.    08/25/2012 Lab Addendum  HERPES SIMPLEX VIRUS CULTURE   Collection Time    08/21/12 10:26 AM      Result Value Range   Organism ID, Bacteria HERPES SIMPLEX VIRUS TYPE 1 DETECTED.     Patient called  and informed of result.  She reports feeling better; the pain is managed on the medications that were prescribed for her.  Discussed suppression therapy; patient is interested in being on suppression.  Prescribed Valacyclovir 500 mg po daily to start immediately after her treatment regimen.  Advised her to look for signs of an impending recurrence such as tingling, pain which may occur before lesions are visible, and to start taking 500 mg twice daily immediately to treat the recurrence for a five day course then resume  normal suppression dosage.  She was told to call or return to clinic with worsening symptoms or any further concerns.  Jaynie Collins, MD, FACOG Attending Obstetrician & Gynecologist Faculty Practice, [facility]

## 2012-08-24 LAB — HERPES SIMPLEX VIRUS CULTURE: Organism ID, Bacteria: DETECTED

## 2012-08-25 ENCOUNTER — Encounter: Payer: Self-pay | Admitting: Obstetrics & Gynecology

## 2012-08-25 MED ORDER — VALACYCLOVIR HCL 500 MG PO TABS: 500.0000 mg | ORAL_TABLET | Freq: Every day | ORAL | Status: DC

## 2012-08-25 NOTE — Addendum Note (Signed)
Addended by: Jaynie Collins A on: 08/25/2012 12:00 PM   Modules accepted: Orders

## 2012-08-28 ENCOUNTER — Other Ambulatory Visit: Payer: Self-pay | Admitting: Obstetrics & Gynecology

## 2012-09-12 ENCOUNTER — Other Ambulatory Visit: Payer: Self-pay | Admitting: Internal Medicine

## 2012-11-08 ENCOUNTER — Other Ambulatory Visit: Payer: Self-pay | Admitting: Internal Medicine

## 2012-12-14 ENCOUNTER — Other Ambulatory Visit: Payer: Self-pay | Admitting: Internal Medicine

## 2012-12-29 ENCOUNTER — Other Ambulatory Visit: Payer: Self-pay | Admitting: Internal Medicine

## 2013-01-15 ENCOUNTER — Other Ambulatory Visit: Payer: Self-pay | Admitting: Internal Medicine

## 2013-01-26 ENCOUNTER — Other Ambulatory Visit: Payer: Self-pay | Admitting: Internal Medicine

## 2013-02-03 ENCOUNTER — Telehealth: Payer: Self-pay | Admitting: Internal Medicine

## 2013-02-03 MED ORDER — SERTRALINE HCL 50 MG PO TABS: ORAL_TABLET | ORAL | Status: DC

## 2013-02-03 NOTE — Telephone Encounter (Signed)
rx sent in electronically 

## 2013-02-03 NOTE — Telephone Encounter (Signed)
Pt need refill on sertraline 50 mg sent to [facility]. Pt has appt sch for 03-03-13

## 2013-02-11 ENCOUNTER — Other Ambulatory Visit: Payer: Self-pay | Admitting: Internal Medicine

## 2013-03-03 ENCOUNTER — Ambulatory Visit (INDEPENDENT_AMBULATORY_CARE_PROVIDER_SITE_OTHER): Payer: PRIVATE HEALTH INSURANCE | Admitting: Internal Medicine

## 2013-03-03 ENCOUNTER — Encounter: Payer: Self-pay | Admitting: Internal Medicine

## 2013-03-03 VITALS — BP 152/78 | HR 72 | Temp 98.6°F | Ht 65.0 in | Wt 153.0 lb

## 2013-03-03 DIAGNOSIS — Z0000 Encounter for general adult medical examination without abnormal findings: Secondary | ICD-10-CM

## 2013-03-03 DIAGNOSIS — E785 Hyperlipidemia, unspecified: Secondary | ICD-10-CM | POA: Insufficient documentation

## 2013-03-03 DIAGNOSIS — Z23 Encounter for immunization: Secondary | ICD-10-CM

## 2013-03-03 DIAGNOSIS — R7309 Other abnormal glucose: Secondary | ICD-10-CM

## 2013-03-03 DIAGNOSIS — R739 Hyperglycemia, unspecified: Secondary | ICD-10-CM

## 2013-03-03 DIAGNOSIS — I10 Essential (primary) hypertension: Secondary | ICD-10-CM

## 2013-03-03 DIAGNOSIS — R7303 Prediabetes: Secondary | ICD-10-CM | POA: Insufficient documentation

## 2013-03-03 LAB — BASIC METABOLIC PANEL
BUN: 18 mg/dL (ref 6–23)
CO2: 28 mEq/L (ref 19–32)
Calcium: 10.1 mg/dL (ref 8.4–10.5)
Chloride: 105 mEq/L (ref 96–112)
Creatinine, Ser: 0.7 mg/dL (ref 0.4–1.2)
GFR: 85.58 mL/min (ref >60.00)
Glucose, Bld: 105 mg/dL — ABNORMAL HIGH (ref 70–99)
POTASSIUM: 5.3 meq/L — AB (ref 3.5–5.1)
SODIUM: 141 meq/L (ref 135–145)

## 2013-03-03 LAB — LIPID PANEL
Cholesterol: 268 mg/dL — ABNORMAL HIGH (ref 0–200)
HDL: 96.6 mg/dL (ref >39.00)
Total CHOL/HDL Ratio: 3
Triglycerides: 102 mg/dL (ref 0.0–149.0)
VLDL: 20.4 mg/dL (ref 0.0–40.0)

## 2013-03-03 LAB — TSH: TSH: 1.81 u[IU]/mL (ref 0.35–5.50)

## 2013-03-03 LAB — HEMOGLOBIN A1C: Hgb A1c MFr Bld: 5.8 % (ref 4.6–6.5)

## 2013-03-03 LAB — LDL CHOLESTEROL, DIRECT: LDL DIRECT: 154.3 mg/dL

## 2013-03-03 MED ORDER — SERTRALINE HCL 50 MG PO TABS: ORAL_TABLET | ORAL | Status: DC

## 2013-03-03 MED ORDER — METOPROLOL TARTRATE 50 MG PO TABS: 50.0000 mg | ORAL_TABLET | Freq: Every day | ORAL | Status: DC

## 2013-03-03 NOTE — Progress Notes (Signed)
Pre visit review using our clinic review tool, if applicable. No additional management support is needed unless otherwise documented below in the visit note. 

## 2013-03-03 NOTE — Progress Notes (Signed)
Here to have meds refilled.  She is seeing Dr. Merlyn LotKuzma- for evaluation of CTS and trigger fingers.   htn- tolerating meds- no home bps  Mood- doing ok Reviewed cjhart- note dx of herpes simplex 1  She understands need for colonoscopy- has been scheduled previously and she has been noncompliant  Past Medical History  Diagnosis Date  . MVP (mitral valve prolapse)   . Hypertension   . Depression   . Anxiety   . 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 Marital Status: Divorced    Spouse Name: N/A    Number of Children: N/A  . Years of Education: N/A   Occupational History  . Not on file.   Social History Main Topics  . Smoking status: Never Smoker   . Smokeless tobacco: Never Used  . Alcohol Use: 12.6 oz/week    21 Cans of beer per week  . Drug Use: No  . Sexual Activity: Yes   Other Topics Concern  . Not on file   Social History Narrative  . No narrative on file    Past Surgical History  Procedure Laterality Date  . Tubal ligation    . Tibia fracture surgery      Family History  Problem Relation Age of Onset  . Osteoporosis Mother   . Heart disease Mother   . Hypertension Mother   . Hyperlipidemia Mother   . Stroke Mother   . Arthritis Mother   . Diabetes Father   . Heart attack Father     Allergies  Allergen Reactions  . Sulfamethoxazole     REACTION: unspecified    Current Outpatient Prescriptions on File Prior to Visit  Medication Sig Dispense Refill  . Calcium Carbonate-Vitamin D (CALTRATE 600+D) 600-400 MG-UNIT per tablet Take 1 tablet by mouth daily.        Marland Kitchen. lidocaine (XYLOCAINE) 2 % jelly Apply topically 3 (three) times daily.  30 mL  4  . loratadine (CLARITIN) 10 MG tablet Take 10 mg by mouth daily.        . Multiple Vitamin (MULTIVITAMIN) tablet Take 1 tablet by mouth daily.        . valACYclovir (VALTREX) 500 MG tablet  Take 1 tablet (500 mg total) by mouth daily. Can increase to twice a day for 5 days in the event of a recurrence  30 tablet  12   No current facility-administered medications on file prior to visit.     patient denies chest pain, shortness of breath, orthopnea. Denies lower extremity edema, abdominal pain, change in appetite, change in bowel movements. Patient denies rashes, musculoskeletal complaints. No other specific complaints in a complete review of systems.   BP 152/78  Pulse 72  Temp(Src) 98.6 F (37 C) (Oral)  Ht 5\' 5"  (1.651 m)  Wt 153 lb (69.4 kg)  BMI 25.46 kg/m2  LMP 12/29/2011  Well-developed well-nourished female in no acute distress. HEENT exam atraumatic, normocephalic, extraocular muscles are intact. Neck is supple. No jugular venous distention no thyromegaly. Chest clear to auscultation without increased work of breathing. Cardiac exam S1 and S2 are regular. Abdominal exam active bowel sounds, soft, nontender. Extremities no edema. Neurologic exam she is alert without any motor sensory deficits. Gait is normal.

## 2013-03-05 ENCOUNTER — Telehealth: Payer: Self-pay | Admitting: Internal Medicine

## 2013-03-05 NOTE — Telephone Encounter (Signed)
Relevant patient education mailed to patient.  

## 2013-03-07 NOTE — Assessment & Plan Note (Signed)
Fair control She has  Hyperlipidemia and hyperglycemia She needs f/u labs I'll refill meds

## 2013-03-15 ENCOUNTER — Other Ambulatory Visit: Payer: Self-pay | Admitting: Orthopedic Surgery

## 2013-03-18 ENCOUNTER — Encounter: Payer: Self-pay | Admitting: Internal Medicine

## 2013-03-25 ENCOUNTER — Encounter (HOSPITAL_BASED_OUTPATIENT_CLINIC_OR_DEPARTMENT_OTHER): Payer: Self-pay | Admitting: *Deleted

## 2013-03-26 ENCOUNTER — Encounter (HOSPITAL_BASED_OUTPATIENT_CLINIC_OR_DEPARTMENT_OTHER): Payer: Self-pay | Admitting: *Deleted

## 2013-03-26 NOTE — Progress Notes (Signed)
Will come in for ekg bmet-had a bmet 03/03/13-k was 5.3-will reck

## 2013-03-29 ENCOUNTER — Encounter (HOSPITAL_BASED_OUTPATIENT_CLINIC_OR_DEPARTMENT_OTHER)
Admission: RE | Admit: 2013-03-29 | Discharge: 2013-03-29 | Disposition: A | Discharge status: Home / Self Care | Payer: PRIVATE HEALTH INSURANCE | Source: Ambulatory Visit | Attending: Orthopedic Surgery | Admitting: Orthopedic Surgery

## 2013-03-29 LAB — BASIC METABOLIC PANEL
BUN: 15 mg/dL (ref 6–23)
CO2: 24 mEq/L (ref 19–32)
Calcium: 10 mg/dL (ref 8.4–10.5)
Chloride: 102 mEq/L (ref 96–112)
Creatinine, Ser: 0.65 mg/dL (ref 0.50–1.10)
GFR calc Af Amer: >90 mL/min (ref >90)
GFR calc non Af Amer: 89 mL/min — ABNORMAL LOW (ref >90)
GLUCOSE: 102 mg/dL — AB (ref 70–99)
POTASSIUM: 4.3 meq/L (ref 3.7–5.3)
Sodium: 140 mEq/L (ref 137–147)

## 2013-03-31 ENCOUNTER — Encounter (HOSPITAL_BASED_OUTPATIENT_CLINIC_OR_DEPARTMENT_OTHER)
Admission: RE | Disposition: A | Discharge status: Home / Self Care | Payer: Self-pay | Source: Ambulatory Visit | Attending: Orthopedic Surgery

## 2013-03-31 ENCOUNTER — Ambulatory Visit (HOSPITAL_BASED_OUTPATIENT_CLINIC_OR_DEPARTMENT_OTHER): Payer: PRIVATE HEALTH INSURANCE | Admitting: Anesthesiology

## 2013-03-31 ENCOUNTER — Ambulatory Visit (HOSPITAL_BASED_OUTPATIENT_CLINIC_OR_DEPARTMENT_OTHER)
Admission: RE | Admit: 2013-03-31 | Discharge: 2013-03-31 | Disposition: A | Discharge status: Home / Self Care | Payer: PRIVATE HEALTH INSURANCE | Source: Ambulatory Visit | Attending: Orthopedic Surgery | Admitting: Orthopedic Surgery

## 2013-03-31 ENCOUNTER — Encounter (HOSPITAL_BASED_OUTPATIENT_CLINIC_OR_DEPARTMENT_OTHER): Payer: PRIVATE HEALTH INSURANCE | Admitting: Anesthesiology

## 2013-03-31 ENCOUNTER — Encounter (HOSPITAL_BASED_OUTPATIENT_CLINIC_OR_DEPARTMENT_OTHER): Payer: Self-pay | Admitting: Orthopedic Surgery

## 2013-03-31 DIAGNOSIS — I10 Essential (primary) hypertension: Secondary | ICD-10-CM | POA: Insufficient documentation

## 2013-03-31 DIAGNOSIS — R011 Cardiac murmur, unspecified: Secondary | ICD-10-CM | POA: Insufficient documentation

## 2013-03-31 DIAGNOSIS — F411 Generalized anxiety disorder: Secondary | ICD-10-CM | POA: Insufficient documentation

## 2013-03-31 DIAGNOSIS — G5600 Carpal tunnel syndrome, unspecified upper limb: Secondary | ICD-10-CM | POA: Insufficient documentation

## 2013-03-31 DIAGNOSIS — F329 Major depressive disorder, single episode, unspecified: Secondary | ICD-10-CM | POA: Insufficient documentation

## 2013-03-31 DIAGNOSIS — F3289 Other specified depressive episodes: Secondary | ICD-10-CM | POA: Insufficient documentation

## 2013-03-31 HISTORY — DX: Unspecified hearing loss, unspecified ear: H91.90

## 2013-03-31 HISTORY — DX: Presence of spectacles and contact lenses: Z97.3

## 2013-03-31 HISTORY — PX: CARPAL TUNNEL RELEASE: SHX101

## 2013-03-31 HISTORY — DX: Cardiac murmur, unspecified: R01.1

## 2013-03-31 SURGERY — CARPAL TUNNEL RELEASE
Anesthesia: Regional | Site: Wrist | Laterality: Right

## 2013-03-31 MED ORDER — ONDANSETRON HCL 4 MG/2ML IJ SOLN: 4.0000 mg | Freq: Once | INTRAMUSCULAR | Status: DC | PRN

## 2013-03-31 MED ORDER — CHLORHEXIDINE GLUCONATE 4 % EX LIQD: 60.0000 mL | Freq: Once | CUTANEOUS | Status: DC

## 2013-03-31 MED ORDER — CEFAZOLIN SODIUM-DEXTROSE 2-3 GM-% IV SOLR
2.0000 g | INTRAVENOUS | Status: AC
  Administered 2013-03-31: 2 g via INTRAVENOUS

## 2013-03-31 MED ORDER — PROPOFOL 10 MG/ML IV EMUL
INTRAVENOUS | Status: AC
  Filled 2013-03-31: qty 50

## 2013-03-31 MED ORDER — BUPIVACAINE HCL (PF) 0.25 % IJ SOLN
INTRAMUSCULAR | Status: DC | PRN
  Administered 2013-03-31: 10 mL

## 2013-03-31 MED ORDER — ONDANSETRON HCL 4 MG/2ML IJ SOLN
INTRAMUSCULAR | Status: DC | PRN
  Administered 2013-03-31: 4 mg via INTRAVENOUS

## 2013-03-31 MED ORDER — LACTATED RINGERS IV SOLN
INTRAVENOUS | Status: DC
  Administered 2013-03-31 (×2): via INTRAVENOUS

## 2013-03-31 MED ORDER — MIDAZOLAM HCL 2 MG/2ML IJ SOLN: 1.0000 mg | INTRAMUSCULAR | Status: DC | PRN

## 2013-03-31 MED ORDER — FENTANYL CITRATE 0.05 MG/ML IJ SOLN: 25.0000 ug | INTRAMUSCULAR | Status: DC | PRN

## 2013-03-31 MED ORDER — BUPIVACAINE HCL (PF) 0.25 % IJ SOLN
INTRAMUSCULAR | Status: AC
  Filled 2013-03-31: qty 30

## 2013-03-31 MED ORDER — CEFAZOLIN SODIUM-DEXTROSE 2-3 GM-% IV SOLR: 2.0000 g | INTRAVENOUS | Status: DC

## 2013-03-31 MED ORDER — OXYCODONE HCL 5 MG PO TABS
5.0000 mg | ORAL_TABLET | Freq: Once | ORAL | Status: AC | PRN
  Administered 2013-03-31: 5 mg via ORAL

## 2013-03-31 MED ORDER — CEFAZOLIN SODIUM-DEXTROSE 2-3 GM-% IV SOLR
INTRAVENOUS | Status: AC
  Filled 2013-03-31: qty 50

## 2013-03-31 MED ORDER — LIDOCAINE HCL (CARDIAC) 20 MG/ML IV SOLN
INTRAVENOUS | Status: DC | PRN
  Administered 2013-03-31: 50 mg via INTRAVENOUS

## 2013-03-31 MED ORDER — MIDAZOLAM HCL 5 MG/5ML IJ SOLN
INTRAMUSCULAR | Status: DC | PRN
  Administered 2013-03-31 (×2): 1 mg via INTRAVENOUS

## 2013-03-31 MED ORDER — ACETAMINOPHEN 325 MG PO TABS: 325.0000 mg | ORAL_TABLET | ORAL | Status: DC | PRN

## 2013-03-31 MED ORDER — DIPHENHYDRAMINE HCL 50 MG/ML IJ SOLN
INTRAMUSCULAR | Status: AC
  Filled 2013-03-31: qty 1

## 2013-03-31 MED ORDER — DIPHENHYDRAMINE HCL 50 MG/ML IJ SOLN
12.5000 mg | Freq: Once | INTRAMUSCULAR | Status: AC
  Administered 2013-03-31: 12.5 mg via INTRAVENOUS

## 2013-03-31 MED ORDER — EPHEDRINE SULFATE 50 MG/ML IJ SOLN
INTRAMUSCULAR | Status: DC | PRN
  Administered 2013-03-31: 10 mg via INTRAVENOUS

## 2013-03-31 MED ORDER — FENTANYL CITRATE 0.05 MG/ML IJ SOLN
INTRAMUSCULAR | Status: AC
  Filled 2013-03-31: qty 2

## 2013-03-31 MED ORDER — OXYCODONE HCL 5 MG PO TABS
ORAL_TABLET | ORAL | Status: AC
  Filled 2013-03-31: qty 1

## 2013-03-31 MED ORDER — MIDAZOLAM HCL 2 MG/2ML IJ SOLN
INTRAMUSCULAR | Status: AC
  Filled 2013-03-31: qty 2

## 2013-03-31 MED ORDER — HYDROCODONE-ACETAMINOPHEN 5-325 MG PO TABS: 1.0000 | ORAL_TABLET | Freq: Four times a day (QID) | ORAL | Status: DC | PRN

## 2013-03-31 MED ORDER — LIDOCAINE HCL (PF) 1 % IJ SOLN
INTRAMUSCULAR | Status: AC
  Filled 2013-03-31: qty 5

## 2013-03-31 MED ORDER — FENTANYL CITRATE 0.05 MG/ML IJ SOLN: 50.0000 ug | INTRAMUSCULAR | Status: DC | PRN

## 2013-03-31 MED ORDER — ACETAMINOPHEN 160 MG/5ML PO SOLN: 325.0000 mg | ORAL | Status: DC | PRN

## 2013-03-31 MED ORDER — FENTANYL CITRATE 0.05 MG/ML IJ SOLN
INTRAMUSCULAR | Status: DC | PRN
Start: 2013-03-31 — End: 2013-03-31
  Administered 2013-03-31 (×2): 50 ug via INTRAVENOUS

## 2013-03-31 MED ORDER — PROPOFOL 10 MG/ML IV BOLUS
INTRAVENOUS | Status: DC | PRN
  Administered 2013-03-31: 170 mg via INTRAVENOUS

## 2013-03-31 SURGICAL SUPPLY — 39 items
BLADE SURG 15 STRL LF DISP TIS (BLADE) ×1 IMPLANT
BLADE SURG 15 STRL SS (BLADE) ×2
BNDG COHESIVE 3X5 TAN STRL LF (GAUZE/BANDAGES/DRESSINGS) ×3 IMPLANT
BNDG ESMARK 4X9 LF (GAUZE/BANDAGES/DRESSINGS) IMPLANT
BNDG GAUZE ELAST 4 BULKY (GAUZE/BANDAGES/DRESSINGS) ×3 IMPLANT
CHLORAPREP W/TINT 26ML (MISCELLANEOUS) ×3 IMPLANT
CORDS BIPOLAR (ELECTRODE) ×3 IMPLANT
COVER MAYO STAND STRL (DRAPES) ×3 IMPLANT
COVER TABLE BACK 60X90 (DRAPES) ×3 IMPLANT
CUFF TOURNIQUET SINGLE 18IN (TOURNIQUET CUFF) ×3 IMPLANT
DRAPE EXTREMITY T 121X128X90 (DRAPE) ×3 IMPLANT
DRAPE SURG 17X23 STRL (DRAPES) ×3 IMPLANT
DRSG KUZMA FLUFF (GAUZE/BANDAGES/DRESSINGS) IMPLANT
GAUZE XEROFORM 1X8 LF (GAUZE/BANDAGES/DRESSINGS) ×3 IMPLANT
GLOVE BIOGEL PI IND STRL 7.0 (GLOVE) ×1 IMPLANT
GLOVE BIOGEL PI IND STRL 8.5 (GLOVE) ×1 IMPLANT
GLOVE BIOGEL PI INDICATOR 7.0 (GLOVE) ×2
GLOVE BIOGEL PI INDICATOR 8.5 (GLOVE) ×2
GLOVE ECLIPSE 6.5 STRL STRAW (GLOVE) ×3 IMPLANT
GLOVE SURG ORTHO 8.0 STRL STRW (GLOVE) ×3 IMPLANT
GOWN STRL REUS W/ TWL LRG LVL3 (GOWN DISPOSABLE) ×1 IMPLANT
GOWN STRL REUS W/TWL LRG LVL3 (GOWN DISPOSABLE) ×2
GOWN STRL REUS W/TWL XL LVL3 (GOWN DISPOSABLE) ×3 IMPLANT
NEEDLE 27GAX1X1/2 (NEEDLE) IMPLANT
NS IRRIG 1000ML POUR BTL (IV SOLUTION) ×3 IMPLANT
PACK BASIN DAY SURGERY FS (CUSTOM PROCEDURE TRAY) ×3 IMPLANT
PAD ABD 8X10 STRL (GAUZE/BANDAGES/DRESSINGS) IMPLANT
PAD CAST 3X4 CTTN HI CHSV (CAST SUPPLIES) ×1 IMPLANT
PADDING CAST ABS 4INX4YD NS (CAST SUPPLIES) ×2
PADDING CAST ABS COTTON 4X4 ST (CAST SUPPLIES) ×1 IMPLANT
PADDING CAST COTTON 3X4 STRL (CAST SUPPLIES) ×3
SPONGE GAUZE 4X4 12PLY (GAUZE/BANDAGES/DRESSINGS) ×3 IMPLANT
STOCKINETTE 4X48 STRL (DRAPES) ×3 IMPLANT
SUT VICRYL 4-0 PS2 18IN ABS (SUTURE) IMPLANT
SUT VICRYL RAPIDE 4/0 PS 2 (SUTURE) ×3 IMPLANT
SYR BULB 3OZ (MISCELLANEOUS) ×3 IMPLANT
SYR CONTROL 10ML LL (SYRINGE) IMPLANT
TOWEL OR 17X24 6PK STRL BLUE (TOWEL DISPOSABLE) ×3 IMPLANT
UNDERPAD 30X30 INCONTINENT (UNDERPADS AND DIAPERS) ×3 IMPLANT

## 2013-03-31 NOTE — Anesthesia Procedure Notes (Signed)
Procedure Name: LMA Insertion Date/Time: 03/31/2013 10:03 AM Performed by: Gar GibbonKEETON, Quientin Jent S Pre-anesthesia Checklist: Patient identified, Emergency Drugs available, Suction available and Patient being monitored Patient Re-evaluated:Patient Re-evaluated prior to inductionOxygen Delivery Method: Circle System Utilized Preoxygenation: Pre-oxygenation with 100% oxygen Intubation Type: IV induction Ventilation: Mask ventilation without difficulty LMA: LMA inserted LMA Size: 4.0 Number of attempts: 1 Airway Equipment and Method: bite block Placement Confirmation: positive ETCO2 Tube secured with: Tape Dental Injury: Teeth and Oropharynx as per pre-operative assessment

## 2013-03-31 NOTE — Transfer of Care (Signed)
Immediate Anesthesia Transfer of Care Note  Patient: Heather [name]  Procedure(s) Performed: Procedure(s): RIGHT CARPAL TUNNEL RELEASE (Right)  Patient Location: PACU  Anesthesia Type:General  Level of Consciousness: awake, alert  and oriented  Airway & Oxygen Therapy: Patient Spontanous Breathing and Patient connected to face mask oxygen  Post-op Assessment: Report given to PACU RN and Post -op Vital signs reviewed and stable  Post vital signs: Reviewed and stable  Complications: No apparent anesthesia complications

## 2013-03-31 NOTE — Discharge Instructions (Addendum)

## 2013-03-31 NOTE — Anesthesia Postprocedure Evaluation (Signed)
  Anesthesia Post-op Note  Patient: Heather ColeMary J Rheaume  Procedure(s) Performed: Procedure(s): RIGHT CARPAL TUNNEL RELEASE (Right)  Patient Location: PACU  Anesthesia Type:General  Level of Consciousness: awake and alert   Airway and Oxygen Therapy: Patient Spontanous Breathing  Post-op Pain: none  Post-op Assessment: Post-op Vital signs reviewed, Patient's Cardiovascular Status Stable and Respiratory Function Stable  Post-op Vital Signs: Reviewed  Filed Vitals:   03/31/13 1100  BP: 140/48  Pulse: 59  Temp:   Resp: 15    Complications: No apparent anesthesia complications

## 2013-03-31 NOTE — H&P (Signed)
Heather [name] is a 68 year old right hand dominant female who is complaining of catching of her small finger right hand. This has been injected on one occasion. She is also complaining of numbness and tingling to the finger tips. The trigger finger has been injected with some relief. She has had a trigger finger in the past on the right side relieved with injection. She has had nerve conductions done by Dr. Zebedee Iba revealing carpal tunnel syndrome bilaterally with significant changes on her right side with motor delay of 6.4, sensory delay of 3.1. Her left side has a motor delay of 4.2 and sensory delay of 2.5. Amplitude diminutions are 19 on the left and 13 on her right.   PAST MEDICAL HISTORY: She is allergic to sulfa. She is on metoprolol, sertraline, progesterone, estradiol, and multivitamins. She has had broken leg and foot surgery. She has had tubal ligation.  FAMILY H ISTORY: Positive for diabetes, heart disease, high BP and arthritis.  SOCIAL HISTORY: She does not smoke or drink. She is divorced and retired.  REVIEW OF SYSTEMS: Positive for glasses, hearing loss, depression, easy bleeding and bruising, otherwise negative for 14 points. Heather [name] is an 68 y.o. female.   Chief Complaint: CTS right HPI: see above  Past Medical History  Diagnosis Date  . MVP (mitral valve prolapse)     echo 1986-mild  . Hypertension   . Depression   . Anxiety   . 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 Wears glasses   . Heart murmur   . HOH (hard of hearing)     left    Past Surgical History  Procedure Laterality Date  . Tubal ligation    . Tibia fracture surgery  1975    rt  . Nasal sinus surgery  1975  . Plantar fascia release  1968    both feet  . Tonsillectomy      Family History  Problem Relation Age of Onset  . Osteoporosis Mother   . Heart disease Mother   . Hypertension Mother   .  Hyperlipidemia Mother   . Stroke Mother   . Arthritis Mother   . Diabetes Father   . Heart attack Father    Social History:  reports that she has never smoked. She has never used smokeless tobacco. She reports that she drinks about 12.6 ounces of alcohol per week. She reports that she does not use illicit drugs.  Allergies:  Allergies  Allergen Reactions  . Sulfamethoxazole     REACTION: unspecified    No prescriptions prior to admission    Results for orders placed during the hospital encounter of 03/31/13 (from the past 48 hour(s))  BASIC METABOLIC PANEL     Status: Abnormal   Collection Time    03/29/13 12:55 PM      Result Value Ref Range   Sodium 140  137 - 147 mEq/L   Potassium 4.3  3.7 - 5.3 mEq/L   Chloride 102  96 - 112 mEq/L   CO2 24  19 - 32 mEq/L   Glucose, Bld 102 (*) 70 - 99 mg/dL   BUN 15  6 - 23 mg/dL   Creatinine, Ser 0.65  0.50 - 1.10 mg/dL   Calcium 10.0  8.4 - 10.5 mg/dL   GFR calc non Af Amer 89 (*) >90 mL/min   GFR calc Af Amer >90  >90 mL/min   Comment: (  NOTE)     The eGFR has been calculated using the CKD EPI equation.     This calculation has not been validated in all clinical situations.     eGFR's persistently <90 mL/min signify possible Chronic Kidney     Disease.    No results found.   Pertinent items are noted in HPI.  Height 5' 5" (1.651 m), weight 153 lb (69.4 kg), last menstrual period 12/29/2011.  General appearance: alert, cooperative and appears stated age Head: Normocephalic, without obvious abnormality Neck: no JVD Resp: clear to auscultation bilaterally Cardio: regular rate and rhythm, S1, S2 normal, no murmur, click, rub or gallop GI: soft, non-tender; bowel sounds normal; no masses,  no organomegaly Extremities: extremities normal, atraumatic, no cyanosis or edema Pulses: 2+ and symmetric Skin: Skin color, texture, turgor normal. No rashes or lesions Neurologic: Grossly normal Incision/Wound: na  Assessment/Plan We  have discussed with her the possibility of surgical release of her carpal tunnel syndrome on her right side. The pre, peri and post op course are discussed along with risks and complications.  She is aware there is no guarantee with surgery, possibility of infection, recurrence, injury to arteries, nerves and tendons, incomplete relief of symptoms and dystrophy.  She would like to proceed. She is scheduled for carpal tunnel release on her right side with possibility of release of trigger fingers dictated by findings at the time of surgery.  Marletta Bousquet R 03/31/2013, 7:48 AM

## 2013-03-31 NOTE — Anesthesia Preprocedure Evaluation (Signed)
Anesthesia Evaluation  Patient identified by MRN, date of birth, ID band Patient awake    Reviewed: Allergy & Precautions, H&P , NPO status , Patient's Chart, lab work & pertinent test results  History of Anesthesia Complications Negative for: history of anesthetic complications  Airway Mallampati: II TM Distance: <3 FB Neck ROM: Full    Dental  (+) Missing,    Pulmonary neg pulmonary ROS,  breath sounds clear to auscultation        Cardiovascular hypertension, Pt. on medications and Pt. on home beta blockers - angina- CHF + dysrhythmias + Valvular Problems/Murmurs MVP Rhythm:Regular Rate:Normal     Neuro/Psych Anxiety Depression    GI/Hepatic negative GI ROS, Neg liver ROS,   Endo/Other  negative endocrine ROS  Renal/GU negative Renal ROS     Musculoskeletal   Abdominal   Peds  Hematology negative hematology ROS (+)   Anesthesia Other Findings   Reproductive/Obstetrics                           Anesthesia Physical Anesthesia Plan  ASA: III  Anesthesia Plan: Bier Block   Post-op Pain Management:    Induction:   Airway Management Planned: Natural Airway  Additional Equipment: None  Intra-op Plan:   Post-operative Plan:   Informed Consent: I have reviewed the patients History and Physical, chart, labs and discussed the procedure including the risks, benefits and alternatives for the proposed anesthesia with the patient or authorized representative who has indicated his/her understanding and acceptance.   Dental advisory given  Plan Discussed with: CRNA and Surgeon  Anesthesia Plan Comments:         Anesthesia Quick Evaluation

## 2013-03-31 NOTE — Brief Op Note (Signed)
03/31/2013  10:24 AM  PATIENT:  Rebeca AlertMary J Ludlam  68 y.o. female  PRE-OPERATIVE DIAGNOSIS:  RIGHT CARPAL TUNNEL SYNDROME  POST-OPERATIVE DIAGNOSIS:  RIGHT CARPAL TUNNEL SYNDROME  PROCEDURE:  Procedure(s): RIGHT CARPAL TUNNEL RELEASE (Right)  SURGEON:  Surgeon(s) and Role:    * Nicki ReaperGary R Britainy Kozub, MD - Primary  PHYSICIAN ASSISTANT:   ASSISTANTS: none   ANESTHESIA:   local and general  EBL:     BLOOD ADMINISTERED:none  DRAINS: none   LOCAL MEDICATIONS USED:  BUPIVICAINE   SPECIMEN:  No Specimen  DISPOSITION OF SPECIMEN:  N/A  COUNTS:  YES  TOURNIQUET:   Total Tourniquet Time Documented: Forearm (Right) - 11 minutes Total: Forearm (Right) - 11 minutes   DICTATION: .Other Dictation: Dictation Number [id number]  PLAN OF CARE: Discharge to home after PACU  PATIENT DISPOSITION:  PACU - hemodynamically stable.

## 2013-03-31 NOTE — Op Note (Signed)
Dictation Number [id number]

## 2013-04-01 ENCOUNTER — Encounter (HOSPITAL_BASED_OUTPATIENT_CLINIC_OR_DEPARTMENT_OTHER): Payer: Self-pay | Admitting: Orthopedic Surgery

## 2013-04-01 LAB — POCT HEMOGLOBIN-HEMACUE: HEMOGLOBIN: 14.5 g/dL (ref 12.0–15.0)

## 2013-04-01 NOTE — Op Note (Signed)
Heather [name]:  Angerer, Ryane                ACCOUNT NO.:  [id number]  MEDICAL RECORD NO.:  [id number]  LOCATION:                                 FACILITY:  PHYSICIAN:  Cindee SaltGary Caleigh Rabelo, M.D.            DATE OF BIRTH:  DATE OF PROCEDURE:  03/31/2013 DATE OF DISCHARGE:                              OPERATIVE REPORT   PREOPERATIVE DIAGNOSIS:  Carpal tunnel syndrome, right hand.  POSTOPERATIVE DIAGNOSIS:  Carpal tunnel syndrome, right hand.  OPERATION:  Decompression, right median nerve.  SURGEON:  Cindee SaltGary Jaydynn Wolford, M.D.  ANESTHESIA:  General with local infiltration.  ANESTHESIOLOGIST:  Dr. Maple HudsonMoser.  HISTORY:  The patient is a 68 year old female with a history of carpal tunnel syndrome.  Nerve conduction is positive, nonresponsive to conservative treatment.  She has elected to undergo release of the right carpal canal.  Pre, peri, and postoperative course have been discussed along with risks and complications.  She is aware that there is no guarantee with the surgery, possibility of infection, recurrence of injury to arteries, nerves, tendons, incomplete relief of symptoms, and dystrophy.  In the preoperative area, the patient is seen, the extremity marked by both patient and surgeon.  Antibiotic given.  PROCEDURE IN DETAIL:  The patient was brought to the operating room, where a general anesthetic was carried out without difficulty and a forearm IV regional was not able to be successfully instituted due to inability to obtain venipuncture.  Time-out taken, confirming the patient and procedure.  She was prepped using ChloraPrep, supine position with the right arm free.  A 3-minute dry time was allowed. Time-out taken, confirming the patient and procedure.  A longitudinal incision was made in the right palm, carried down through subcutaneous tissue.  Bleeders were electrocauterized with bipolar.  Palmar fascia was split.  Superficial palmar arch identified.  The flexor tendon to the ring and little  finger identified to the ulnar side of the median nerve.  The carpal retinaculum was incised with sharp dissection.  A right-angle and Sewall retractors were placed between skin and forearm fascia.  The fascia was released for approximately 1.5 cm proximal to the wrist crease under direct vision.  Canal was explored.  Air compression to the nerve was apparent.  Motor branch entered in the muscle.  No further lesions were identified.  The wound was irrigated and the skin closed with interrupted 4-0 Vicryl Rapide sutures.  Local infiltration with 0.25% percent Marcaine without epinephrine was given, 8 mL was used.  Sterile compressive dressing with the fingers free was applied.  On deflation of the tourniquet, all fingers immediately pinked.  She was taken to the recovery room for observation in a satisfactory condition.  The limb was exsanguinated with an Esmarch bandage.  Tourniquet placed on the forearm was inflated to 250 mm during the procedure.  The patient tolerated the procedure well, was taken to the recovery room.  She will be discharged on Norco to return in 1 week.          ______________________________ Cindee SaltGary Thelda Gagan, M.D.     GK/MEDQ  D:  03/31/2013  T:  04/01/2013  Job:  [id number]

## 2013-04-02 NOTE — Addendum Note (Signed)
Addendum created 04/02/13 1447 by Corky Soxhris Marielys Trinidad, MD   Modules edited: Anesthesia Attestations

## 2013-05-05 ENCOUNTER — Ambulatory Visit (AMBULATORY_SURGERY_CENTER): Payer: Self-pay | Admitting: *Deleted

## 2013-05-05 VITALS — Ht 65.0 in | Wt 150.4 lb

## 2013-05-05 DIAGNOSIS — Z1211 Encounter for screening for malignant neoplasm of colon: Secondary | ICD-10-CM

## 2013-05-05 MED ORDER — MOVIPREP 100 G PO SOLR: ORAL | Status: DC

## 2013-05-05 NOTE — Progress Notes (Signed)
No allergies to eggs or soy. No problems with anesthesia.  

## 2013-05-10 ENCOUNTER — Encounter: Payer: Self-pay | Admitting: Internal Medicine

## 2013-05-17 ENCOUNTER — Telehealth: Payer: Self-pay | Admitting: Internal Medicine

## 2013-05-17 NOTE — Telephone Encounter (Signed)
Patient is trying to get in touch with Janelle pre-cert since "friday", patient states she has not called her back and she left 2 messages. Patient is concern about cost of up coming colonoscopy. States that she has medicare and medicaid. Explained that patient should be able to still have colonoscopy after eating normal this past week. [facility]. Encouraged patient to call us back to cancel as soon as possible, she understands.

## 2013-05-18 ENCOUNTER — Telehealth: Payer: Self-pay | Admitting: Internal Medicine

## 2013-05-18 NOTE — Telephone Encounter (Signed)
Pt canceled her procedure for tomorrow because she has not prepped because she did not know what her benefits would be with her insurance until today. Per Dottie she told me to call Pt back and inform her that she could start her prep now and still follow through with her procedure.  Called pt and informed her and she still wanted to cancel the appt until our July calender comes avail.  Informed pt that she may be charged the cancellation fee if she did not keep her appt. Pt declined....Marland Kitchen.Marland Kitchen.Would you like pt charged the cancelation fee?

## 2013-05-19 ENCOUNTER — Encounter: Payer: PRIVATE HEALTH INSURANCE | Admitting: Internal Medicine

## 2013-05-19 NOTE — Telephone Encounter (Signed)
Please charge cancellation fee. Do not reschedule.She will need an OV first.

## 2013-05-21 NOTE — Telephone Encounter (Signed)
Unable to [employer]ancell[facility] F[facility] due to Insurance/yf

## 2013-09-02 ENCOUNTER — Other Ambulatory Visit: Payer: Self-pay | Admitting: Internal Medicine

## 2013-09-22 ENCOUNTER — Other Ambulatory Visit: Payer: Self-pay | Admitting: Obstetrics & Gynecology

## 2013-10-21 ENCOUNTER — Other Ambulatory Visit: Payer: Self-pay | Admitting: Obstetrics & Gynecology

## 2013-11-20 ENCOUNTER — Telehealth: Payer: Self-pay

## 2013-11-20 NOTE — Telephone Encounter (Signed)
LVM for pt to call back.    RE: scheduling AWV for 2015 with NP or PA if pt allows.  

## 2014-01-11 ENCOUNTER — Encounter: Payer: Medicare Other | Admitting: Internal Medicine

## 2014-02-04 DIAGNOSIS — M5136 Other intervertebral disc degeneration, lumbar region: Secondary | ICD-10-CM | POA: Diagnosis not present

## 2014-02-04 DIAGNOSIS — M4726 Other spondylosis with radiculopathy, lumbar region: Secondary | ICD-10-CM | POA: Diagnosis not present

## 2014-03-01 ENCOUNTER — Ambulatory Visit: Payer: Medicare Other | Admitting: Internal Medicine

## 2014-03-03 ENCOUNTER — Encounter (INDEPENDENT_AMBULATORY_CARE_PROVIDER_SITE_OTHER): Payer: Self-pay

## 2014-03-03 ENCOUNTER — Encounter: Payer: Self-pay | Admitting: Internal Medicine

## 2014-03-03 ENCOUNTER — Ambulatory Visit (INDEPENDENT_AMBULATORY_CARE_PROVIDER_SITE_OTHER): Payer: Medicare Other | Admitting: Internal Medicine

## 2014-03-03 VITALS — BP 142/84 | HR 56 | Temp 98.2°F | Ht 65.0 in | Wt 159.0 lb

## 2014-03-03 DIAGNOSIS — R7309 Other abnormal glucose: Secondary | ICD-10-CM

## 2014-03-03 DIAGNOSIS — J302 Other seasonal allergic rhinitis: Secondary | ICD-10-CM | POA: Insufficient documentation

## 2014-03-03 DIAGNOSIS — I10 Essential (primary) hypertension: Secondary | ICD-10-CM | POA: Diagnosis not present

## 2014-03-03 DIAGNOSIS — R7303 Prediabetes: Secondary | ICD-10-CM

## 2014-03-03 DIAGNOSIS — E785 Hyperlipidemia, unspecified: Secondary | ICD-10-CM | POA: Diagnosis not present

## 2014-03-03 DIAGNOSIS — F419 Anxiety disorder, unspecified: Secondary | ICD-10-CM

## 2014-03-03 DIAGNOSIS — M549 Dorsalgia, unspecified: Secondary | ICD-10-CM

## 2014-03-03 DIAGNOSIS — G8929 Other chronic pain: Secondary | ICD-10-CM

## 2014-03-03 DIAGNOSIS — A6000 Herpesviral infection of urogenital system, unspecified: Secondary | ICD-10-CM

## 2014-03-03 DIAGNOSIS — F32A Depression, unspecified: Secondary | ICD-10-CM

## 2014-03-03 DIAGNOSIS — F329 Major depressive disorder, single episode, unspecified: Secondary | ICD-10-CM

## 2014-03-03 DIAGNOSIS — Z780 Asymptomatic menopausal state: Secondary | ICD-10-CM

## 2014-03-03 DIAGNOSIS — F418 Other specified anxiety disorders: Secondary | ICD-10-CM

## 2014-03-03 LAB — CBC
HCT: 39 % (ref 36.0–46.0)
Hemoglobin: 13.2 g/dL (ref 12.0–15.0)
MCHC: 33.8 g/dL (ref 30.0–36.0)
MCV: 91.5 fl (ref 78.0–100.0)
Platelets: 294 10*3/uL (ref 150.0–400.0)
RBC: 4.26 Mil/uL (ref 3.87–5.11)
RDW: 13.1 % (ref 11.5–15.5)
WBC: 9.2 10*3/uL (ref 4.0–10.5)

## 2014-03-03 LAB — COMPREHENSIVE METABOLIC PANEL
ALBUMIN: 4.2 g/dL (ref 3.5–5.2)
ALK PHOS: 104 U/L (ref 39–117)
ALT: 24 U/L (ref 0–35)
AST: 19 U/L (ref 0–37)
BUN: 23 mg/dL (ref 6–23)
CALCIUM: 10.4 mg/dL (ref 8.4–10.5)
CO2: 29 mEq/L (ref 19–32)
Chloride: 102 mEq/L (ref 96–112)
Creatinine, Ser: 0.81 mg/dL (ref 0.40–1.20)
GFR: 74.48 mL/min (ref >60.00)
Glucose, Bld: 104 mg/dL — ABNORMAL HIGH (ref 70–99)
POTASSIUM: 5.1 meq/L (ref 3.5–5.1)
SODIUM: 137 meq/L (ref 135–145)
Total Bilirubin: 0.4 mg/dL (ref 0.2–1.2)
Total Protein: 6.4 g/dL (ref 6.0–8.3)

## 2014-03-03 LAB — HEMOGLOBIN A1C: Hgb A1c MFr Bld: 5.9 % (ref 4.6–6.5)

## 2014-03-03 LAB — LIPID PANEL
CHOL/HDL RATIO: 3
CHOLESTEROL: 244 mg/dL — AB (ref 0–200)
HDL: 75.3 mg/dL (ref >39.00)
NONHDL: 168.7
Triglycerides: 248 mg/dL — ABNORMAL HIGH (ref 0.0–149.0)
VLDL: 49.6 mg/dL — ABNORMAL HIGH (ref 0.0–40.0)

## 2014-03-03 LAB — LDL CHOLESTEROL, DIRECT: LDL DIRECT: 142 mg/dL

## 2014-03-03 LAB — VITAMIN D 25 HYDROXY (VIT D DEFICIENCY, FRACTURES): VITD: 51.37 ng/mL (ref 30.00–100.00)

## 2014-03-03 NOTE — Assessment & Plan Note (Signed)
Will repeat A1C today Encouraged her to work on diet and exercise 
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
## 2014-03-03 NOTE — Assessment & Plan Note (Signed)
Chronic but stable Will continue zoloft for now Will check CBC and CMET today Will consider wean at next visit

## 2014-03-03 NOTE — Assessment & Plan Note (Signed)
Continue claritin daily 

## 2014-03-03 NOTE — Assessment & Plan Note (Signed)
Continue Meloxicam, Tramadol and Neurontin Continue to follow with orthopedics

## 2014-03-03 NOTE — Patient Instructions (Signed)

## 2014-03-03 NOTE — Assessment & Plan Note (Signed)
Elevated I do not think she is well controlled on Toprol alone Will check CBC and CMET today If elevated at next visit, consider alternative therapy

## 2014-03-03 NOTE — Assessment & Plan Note (Signed)
No recent outbreak Continue daily Valtrex

## 2014-03-03 NOTE — Assessment & Plan Note (Addendum)
LDL goal < 130 Will repeat Lipid profile and CMET today Discussed possibly of need for cholesterol medication with pt- she understands and agrees Reinforced low fat diet

## 2014-03-08 ENCOUNTER — Telehealth: Payer: Self-pay | Admitting: Internal Medicine

## 2014-03-08 NOTE — Telephone Encounter (Signed)
Pt called returning your call from last evening.  Please call back at [phone]. Thank you

## 2014-03-10 ENCOUNTER — Other Ambulatory Visit: Payer: Self-pay | Admitting: Internal Medicine

## 2014-03-16 DIAGNOSIS — M65342 Trigger finger, left ring finger: Secondary | ICD-10-CM | POA: Diagnosis not present

## 2014-03-16 DIAGNOSIS — M65352 Trigger finger, left little finger: Secondary | ICD-10-CM | POA: Diagnosis not present

## 2014-03-16 DIAGNOSIS — G5602 Carpal tunnel syndrome, left upper limb: Secondary | ICD-10-CM | POA: Diagnosis not present

## 2014-05-28 ENCOUNTER — Other Ambulatory Visit: Payer: Self-pay | Admitting: Internal Medicine

## 2014-06-01 ENCOUNTER — Other Ambulatory Visit: Payer: Self-pay | Admitting: Internal Medicine

## 2014-06-15 ENCOUNTER — Telehealth: Payer: Self-pay

## 2014-06-15 MED ORDER — ALPRAZOLAM 0.25 MG PO TABS: 0.2500 mg | ORAL_TABLET | Freq: Every day | ORAL | Status: DC | PRN

## 2014-06-15 NOTE — Telephone Encounter (Signed)
Please call in Xanax 0.25 mg tabs, take 1 tab daily prn for anxiety # 20, 0 refills

## 2014-06-15 NOTE — Addendum Note (Signed)
Addended by: Roena MaladyEVONTENNO, Joslynn Jamroz Y on: 06/15/2014 01:38 PM   Modules accepted: Orders

## 2014-06-15 NOTE — Telephone Encounter (Signed)
Rx called in to pharmacy. 

## 2014-06-15 NOTE — Telephone Encounter (Signed)
Pt's mother has just died and the funeral is 06/17/14; pt request med to help her be calm during this time of loss; pt said has CP on and off due to nerves; pt not having CP now. Pt is taking her metoprolol. Pt request cb. [name]. Pt will call after mothers funeral for f/u appt. Pt seen on 03/03/14 to establish.

## 2014-08-16 ENCOUNTER — Ambulatory Visit (INDEPENDENT_AMBULATORY_CARE_PROVIDER_SITE_OTHER): Payer: Medicare Other | Admitting: Internal Medicine

## 2014-08-16 ENCOUNTER — Encounter: Payer: Self-pay | Admitting: Internal Medicine

## 2014-08-16 VITALS — BP 124/64 | HR 69 | Temp 97.7°F | Ht 64.75 in | Wt 153.1 lb

## 2014-08-16 DIAGNOSIS — I10 Essential (primary) hypertension: Secondary | ICD-10-CM | POA: Diagnosis not present

## 2014-08-16 DIAGNOSIS — Z780 Asymptomatic menopausal state: Secondary | ICD-10-CM

## 2014-08-16 DIAGNOSIS — A6000 Herpesviral infection of urogenital system, unspecified: Secondary | ICD-10-CM

## 2014-08-16 DIAGNOSIS — J302 Other seasonal allergic rhinitis: Secondary | ICD-10-CM

## 2014-08-16 DIAGNOSIS — F418 Other specified anxiety disorders: Secondary | ICD-10-CM

## 2014-08-16 DIAGNOSIS — G8929 Other chronic pain: Secondary | ICD-10-CM

## 2014-08-16 DIAGNOSIS — E785 Hyperlipidemia, unspecified: Secondary | ICD-10-CM | POA: Diagnosis not present

## 2014-08-16 DIAGNOSIS — F329 Major depressive disorder, single episode, unspecified: Secondary | ICD-10-CM

## 2014-08-16 DIAGNOSIS — Z0000 Encounter for general adult medical examination without abnormal findings: Secondary | ICD-10-CM

## 2014-08-16 DIAGNOSIS — R7303 Prediabetes: Secondary | ICD-10-CM

## 2014-08-16 DIAGNOSIS — F419 Anxiety disorder, unspecified: Secondary | ICD-10-CM

## 2014-08-16 DIAGNOSIS — M549 Dorsalgia, unspecified: Secondary | ICD-10-CM

## 2014-08-16 DIAGNOSIS — R7309 Other abnormal glucose: Secondary | ICD-10-CM

## 2014-08-16 LAB — CBC WITH DIFFERENTIAL/PLATELET
BASOS PCT: 0.3 % (ref 0.0–3.0)
Basophils Absolute: 0 10*3/uL (ref 0.0–0.1)
Eosinophils Absolute: 0.5 10*3/uL (ref 0.0–0.7)
Eosinophils Relative: 5.8 % — ABNORMAL HIGH (ref 0.0–5.0)
HEMATOCRIT: 41.5 % (ref 36.0–46.0)
Hemoglobin: 13.9 g/dL (ref 12.0–15.0)
LYMPHS ABS: 2 10*3/uL (ref 0.7–4.0)
LYMPHS PCT: 24.2 % (ref 12.0–46.0)
MCHC: 33.5 g/dL (ref 30.0–36.0)
MCV: 94.2 fl (ref 78.0–100.0)
Monocytes Absolute: 0.7 10*3/uL (ref 0.1–1.0)
Monocytes Relative: 8 % (ref 3.0–12.0)
Neutro Abs: 5.1 10*3/uL (ref 1.4–7.7)
Neutrophils Relative %: 61.7 % (ref 43.0–77.0)
Platelets: 297 10*3/uL (ref 150.0–400.0)
RBC: 4.41 Mil/uL (ref 3.87–5.11)
RDW: 13 % (ref 11.5–15.5)
WBC: 8.3 10*3/uL (ref 4.0–10.5)

## 2014-08-16 LAB — LIPID PANEL
CHOLESTEROL: 254 mg/dL — AB (ref 0–200)
HDL: 69.3 mg/dL (ref >39.00)
LDL CALC: 149 mg/dL — AB (ref 0–99)
NonHDL: 184.7
Total CHOL/HDL Ratio: 4
Triglycerides: 181 mg/dL — ABNORMAL HIGH (ref 0.0–149.0)
VLDL: 36.2 mg/dL (ref 0.0–40.0)

## 2014-08-16 LAB — VITAMIN D 25 HYDROXY (VIT D DEFICIENCY, FRACTURES): VITD: 49.78 ng/mL (ref 30.00–100.00)

## 2014-08-16 MED ORDER — ALPRAZOLAM 0.25 MG PO TABS: 0.2500 mg | ORAL_TABLET | Freq: Every day | ORAL | Status: DC | PRN
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
## 2014-08-16 NOTE — Assessment & Plan Note (Signed)
She denies this  Encouraged her to consume a low fat diet Will check CMET and Lipid profile today

## 2014-08-16 NOTE — Assessment & Plan Note (Signed)
Slightly worse Support offered today She is not interested in increasing Zoloft at this time Xanax refilled today

## 2014-08-16 NOTE — Progress Notes (Signed)
Pre visit review using our clinic review tool, if applicable. No additional management support is needed unless otherwise documented below in the visit note. 

## 2014-08-16 NOTE — Assessment & Plan Note (Signed)
Controlled with Claritin daily

## 2014-08-16 NOTE — Assessment & Plan Note (Signed)
Well controlled on Toprol ECG from 03/2013 reviewed Will check CBC and CMET today

## 2014-08-16 NOTE — Assessment & Plan Note (Signed)
No recent outbreak on Valtrex Will continue to monitor

## 2014-08-16 NOTE — Assessment & Plan Note (Signed)
She will continue Mobic, Tramadol and Gabapentin Will check CMET today She will continue to follow with Dr. Otelia SergeantNitka

## 2014-08-16 NOTE — Patient Instructions (Signed)

## 2014-08-16 NOTE — Assessment & Plan Note (Signed)
Will repeat A1C today Encouraged her to consume a low carb diet

## 2014-08-17 NOTE — Addendum Note (Signed)
Addended by: Roena MaladyEVONTENNO, Annebelle Bostic Y on: 08/17/2014 04:58 PM   Modules accepted: Orders

## 2014-08-25 ENCOUNTER — Encounter: Payer: Self-pay | Admitting: *Deleted

## 2014-09-15 ENCOUNTER — Other Ambulatory Visit: Payer: Self-pay | Admitting: Internal Medicine

## 2014-09-15 NOTE — Telephone Encounter (Signed)
Ok to phone in Xanax. I did not see CSA or UDS on file. Can we get that done?

## 2014-09-15 NOTE — Telephone Encounter (Signed)
Last filled 08/16/14--please advise

## 2014-09-16 NOTE — Telephone Encounter (Signed)
Left message on voicemail Rx called into pharmacy Will need pt to come in to do a CSA before next refill

## 2014-09-20 ENCOUNTER — Other Ambulatory Visit: Payer: Self-pay | Admitting: Internal Medicine

## 2014-10-26 ENCOUNTER — Other Ambulatory Visit: Payer: Self-pay | Admitting: Obstetrics & Gynecology

## 2014-11-01 ENCOUNTER — Other Ambulatory Visit: Payer: Self-pay | Admitting: Specialist

## 2014-11-01 DIAGNOSIS — M545 Low back pain: Secondary | ICD-10-CM

## 2014-11-02 ENCOUNTER — Other Ambulatory Visit: Payer: Self-pay | Admitting: Specialist

## 2014-11-02 DIAGNOSIS — M545 Low back pain: Secondary | ICD-10-CM

## 2014-11-04 ENCOUNTER — Other Ambulatory Visit: Payer: Self-pay | Admitting: Internal Medicine

## 2014-11-04 NOTE — Telephone Encounter (Signed)
Rx called in to pharmacy. 

## 2014-11-04 NOTE — Telephone Encounter (Signed)
Last filled 09/15/2014---pt is aware that before next refill she will have to come in to sign CSA--pt states she has a lab only appt later this month and will do it then--please advise if okay to refill

## 2014-11-04 NOTE — Telephone Encounter (Signed)
Ok to phone in Xanax 

## 2014-11-17 ENCOUNTER — Other Ambulatory Visit: Payer: Medicare Other

## 2014-11-21 ENCOUNTER — Other Ambulatory Visit (INDEPENDENT_AMBULATORY_CARE_PROVIDER_SITE_OTHER): Payer: Medicare Other

## 2014-11-21 DIAGNOSIS — E785 Hyperlipidemia, unspecified: Secondary | ICD-10-CM

## 2014-11-21 LAB — LIPID PANEL
CHOL/HDL RATIO: 4
Cholesterol: 228 mg/dL — ABNORMAL HIGH (ref 0–200)
HDL: 64.8 mg/dL (ref >39.00)
LDL Cholesterol: 139 mg/dL — ABNORMAL HIGH (ref 0–99)
NonHDL: 162.86
TRIGLYCERIDES: 120 mg/dL (ref 0.0–149.0)
VLDL: 24 mg/dL (ref 0.0–40.0)

## 2014-11-23 ENCOUNTER — Ambulatory Visit
Admission: RE | Admit: 2014-11-23 | Discharge: 2014-11-23 | Disposition: A | Discharge status: Home / Self Care | Payer: Medicare Other | Source: Ambulatory Visit | Attending: Specialist | Admitting: Specialist

## 2014-11-23 DIAGNOSIS — M545 Low back pain: Secondary | ICD-10-CM

## 2014-11-28 ENCOUNTER — Telehealth: Payer: Self-pay | Admitting: Primary Care

## 2014-11-28 NOTE — Telephone Encounter (Signed)
Will you please call Ms. Bultema and verify if she's taking her Xanax? Thanks.

## 2014-11-29 NOTE — Telephone Encounter (Signed)
Message left for patient to return my call.  

## 2014-11-30 ENCOUNTER — Telehealth: Payer: Self-pay | Admitting: Primary Care

## 2014-11-30 NOTE — Telephone Encounter (Signed)
Called patient today. Asked patient if she is taking her Xanax. Patient stated that she is taking her Xanax and it working great for her.

## 2014-11-30 NOTE — Telephone Encounter (Signed)
Noted. Looks like her UDS was collected on 11/22/14 and is negative for Alprazolam. Alprazolam was prescribed on 11/04/14. Will send to PCP for further evaluation.

## 2014-12-03 NOTE — Telephone Encounter (Signed)
It does not look like she has been get a monthly RX, lets repeat UDS in 3 months.

## 2014-12-04 ENCOUNTER — Other Ambulatory Visit: Payer: Self-pay | Admitting: Internal Medicine

## 2014-12-06 NOTE — Telephone Encounter (Signed)
Last filled 10/05/14---please advise

## 2014-12-06 NOTE — Telephone Encounter (Signed)
Ok to phone in Xanax 

## 2014-12-07 NOTE — Telephone Encounter (Signed)
Rx called into pharmacy--UDS needed 03/2015

## 2014-12-15 ENCOUNTER — Encounter: Payer: Self-pay | Admitting: Internal Medicine

## 2015-01-03 ENCOUNTER — Other Ambulatory Visit: Payer: Self-pay | Admitting: Internal Medicine

## 2015-01-03 NOTE — Telephone Encounter (Signed)
Last filled 12/06/14---please advise if okay to call in for 01/05/2015

## 2015-01-03 NOTE — Telephone Encounter (Signed)
Ok to phone in Xanax 

## 2015-01-05 ENCOUNTER — Other Ambulatory Visit: Payer: Self-pay | Admitting: Internal Medicine

## 2015-01-05 NOTE — Telephone Encounter (Signed)
Rx called in to pharmacy. 

## 2015-02-01 ENCOUNTER — Other Ambulatory Visit: Payer: Self-pay | Admitting: Internal Medicine

## 2015-02-01 NOTE — Telephone Encounter (Signed)
Rx called in to pharmacy. 

## 2015-02-01 NOTE — Telephone Encounter (Signed)
Last filled 01/03/2015--please advise

## 2015-02-01 NOTE — Telephone Encounter (Signed)
Ok to phone in Xanax 

## 2015-02-09 ENCOUNTER — Other Ambulatory Visit: Payer: Self-pay | Admitting: Student

## 2015-02-09 ENCOUNTER — Ambulatory Visit
Admission: RE | Admit: 2015-02-09 | Discharge: 2015-02-09 | Disposition: A | Discharge status: Home / Self Care | Payer: Medicare Other | Source: Ambulatory Visit

## 2015-02-09 ENCOUNTER — Other Ambulatory Visit: Payer: Self-pay

## 2015-02-09 ENCOUNTER — Other Ambulatory Visit: Payer: Self-pay | Admitting: Specialist

## 2015-02-09 DIAGNOSIS — M7989 Other specified soft tissue disorders: Secondary | ICD-10-CM

## 2015-02-10 ENCOUNTER — Other Ambulatory Visit: Payer: Self-pay | Admitting: Specialist

## 2015-02-10 DIAGNOSIS — M7989 Other specified soft tissue disorders: Secondary | ICD-10-CM

## 2015-02-20 ENCOUNTER — Other Ambulatory Visit (HOSPITAL_COMMUNITY): Payer: Self-pay | Admitting: Specialist

## 2015-02-21 ENCOUNTER — Encounter: Payer: Self-pay | Admitting: Internal Medicine

## 2015-02-21 ENCOUNTER — Other Ambulatory Visit (HOSPITAL_COMMUNITY): Payer: Self-pay | Admitting: Specialist

## 2015-02-21 ENCOUNTER — Ambulatory Visit (INDEPENDENT_AMBULATORY_CARE_PROVIDER_SITE_OTHER): Payer: Medicare Other | Admitting: Internal Medicine

## 2015-02-21 VITALS — BP 146/84 | HR 90 | Temp 98.2°F | Wt 156.0 lb

## 2015-02-21 DIAGNOSIS — R7303 Prediabetes: Secondary | ICD-10-CM

## 2015-02-21 DIAGNOSIS — M549 Dorsalgia, unspecified: Secondary | ICD-10-CM

## 2015-02-21 DIAGNOSIS — A6000 Herpesviral infection of urogenital system, unspecified: Secondary | ICD-10-CM

## 2015-02-21 DIAGNOSIS — I10 Essential (primary) hypertension: Secondary | ICD-10-CM | POA: Diagnosis not present

## 2015-02-21 DIAGNOSIS — R0989 Other specified symptoms and signs involving the circulatory and respiratory systems: Secondary | ICD-10-CM

## 2015-02-21 DIAGNOSIS — E785 Hyperlipidemia, unspecified: Secondary | ICD-10-CM | POA: Diagnosis not present

## 2015-02-21 DIAGNOSIS — G8929 Other chronic pain: Secondary | ICD-10-CM

## 2015-02-21 DIAGNOSIS — J302 Other seasonal allergic rhinitis: Secondary | ICD-10-CM | POA: Diagnosis not present

## 2015-02-21 DIAGNOSIS — F418 Other specified anxiety disorders: Secondary | ICD-10-CM

## 2015-02-21 DIAGNOSIS — F419 Anxiety disorder, unspecified: Secondary | ICD-10-CM

## 2015-02-21 DIAGNOSIS — F329 Major depressive disorder, single episode, unspecified: Secondary | ICD-10-CM

## 2015-02-21 LAB — LDL CHOLESTEROL, DIRECT: Direct LDL: 132 mg/dL

## 2015-02-21 LAB — LIPID PANEL
CHOLESTEROL: 248 mg/dL — AB (ref 0–200)
HDL: 74.1 mg/dL (ref >39.00)
NonHDL: 173.57
TRIGLYCERIDES: 234 mg/dL — AB (ref 0.0–149.0)
Total CHOL/HDL Ratio: 3
VLDL: 46.8 mg/dL — ABNORMAL HIGH (ref 0.0–40.0)

## 2015-02-21 LAB — COMPREHENSIVE METABOLIC PANEL
ALBUMIN: 4.2 g/dL (ref 3.5–5.2)
ALK PHOS: 85 U/L (ref 39–117)
ALT: 40 U/L — AB (ref 0–35)
AST: 35 U/L (ref 0–37)
BILIRUBIN TOTAL: 0.4 mg/dL (ref 0.2–1.2)
BUN: 20 mg/dL (ref 6–23)
CALCIUM: 9.7 mg/dL (ref 8.4–10.5)
CO2: 28 mEq/L (ref 19–32)
CREATININE: 0.79 mg/dL (ref 0.40–1.20)
Chloride: 104 mEq/L (ref 96–112)
GFR: 76.45 mL/min (ref >60.00)
Glucose, Bld: 99 mg/dL (ref 70–99)
Potassium: 4.3 mEq/L (ref 3.5–5.1)
Sodium: 138 mEq/L (ref 135–145)
Total Protein: 6.6 g/dL (ref 6.0–8.3)

## 2015-02-21 LAB — HEMOGLOBIN A1C: Hgb A1c MFr Bld: 5.8 % (ref 4.6–6.5)

## 2015-02-21 NOTE — Progress Notes (Signed)
Pre visit review using our clinic review tool, if applicable. No additional management support is needed unless otherwise documented below in the visit note. 
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
## 2015-02-21 NOTE — Assessment & Plan Note (Signed)
Will check A1C today.

## 2015-02-21 NOTE — Assessment & Plan Note (Signed)
Chronic but stable Continue Zoloft and Xanax UDS today CMET today

## 2015-02-21 NOTE — Patient Instructions (Signed)

## 2015-02-21 NOTE — Assessment & Plan Note (Signed)
Controlled on Toprol Slightly higher today but she is in pain

## 2015-02-21 NOTE — Assessment & Plan Note (Signed)
Continue daily Valtrex

## 2015-02-21 NOTE — Assessment & Plan Note (Signed)
Will check CMET and Lipid Profile today Encouraged her to consume a low fat diet 

## 2015-02-21 NOTE — Assessment & Plan Note (Signed)
Follows with Dr. Otelia Sergeant Has upcoming back surgery planned Continue Norco as prescribed by Dr. Otelia Sergeant

## 2015-02-21 NOTE — Assessment & Plan Note (Signed)
Continue daily Claritin 

## 2015-02-22 ENCOUNTER — Telehealth: Payer: Self-pay | Admitting: Internal Medicine

## 2015-02-22 NOTE — Telephone Encounter (Signed)
Pt called asking what the plan for her neck/heart build up. She is scheduled for back surgery in the beginning of February. She said she did not know she wouldn't see Rene Kocher again after the blood work. You can reach her at [phone].

## 2015-02-22 NOTE — Telephone Encounter (Signed)
Spoke to pt and advised labs usually take 24-48 hours to come back and has not been interpreted yet. I will call when results are sent to me

## 2015-02-22 NOTE — Telephone Encounter (Signed)
We are getting a carotid ultrasound. Did she see Shirlee Limerick on the way out after labs to get that scheduled?

## 2015-02-27 NOTE — Telephone Encounter (Signed)
Patient returned Melanie's call.  Please call her back at [phone].

## 2015-02-28 ENCOUNTER — Telehealth: Payer: Self-pay | Admitting: *Deleted

## 2015-02-28 ENCOUNTER — Ambulatory Visit (HOSPITAL_COMMUNITY)
Admission: RE | Admit: 2015-02-28 | Discharge: 2015-02-28 | Disposition: A | Discharge status: Home / Self Care | Payer: Medicare Other | Source: Ambulatory Visit | Attending: Specialist | Admitting: Specialist

## 2015-02-28 ENCOUNTER — Encounter (HOSPITAL_COMMUNITY)
Admission: RE | Admit: 2015-02-28 | Discharge: 2015-02-28 | Disposition: A | Discharge status: Home / Self Care | Payer: Medicare Other | Source: Ambulatory Visit | Attending: Specialist | Admitting: Specialist

## 2015-02-28 ENCOUNTER — Encounter (HOSPITAL_COMMUNITY): Payer: Self-pay

## 2015-02-28 ENCOUNTER — Other Ambulatory Visit: Payer: Self-pay

## 2015-02-28 ENCOUNTER — Ambulatory Visit (HOSPITAL_COMMUNITY)
Admission: RE | Admit: 2015-02-28 | Discharge: 2015-02-28 | Disposition: A | Discharge status: Home / Self Care | Payer: Medicare Other | Source: Ambulatory Visit | Attending: Internal Medicine | Admitting: Internal Medicine

## 2015-02-28 DIAGNOSIS — Z01812 Encounter for preprocedural laboratory examination: Secondary | ICD-10-CM | POA: Insufficient documentation

## 2015-02-28 DIAGNOSIS — Z01810 Encounter for preprocedural cardiovascular examination: Secondary | ICD-10-CM | POA: Diagnosis not present

## 2015-02-28 DIAGNOSIS — Z01818 Encounter for other preprocedural examination: Secondary | ICD-10-CM | POA: Diagnosis not present

## 2015-02-28 DIAGNOSIS — R001 Bradycardia, unspecified: Secondary | ICD-10-CM | POA: Diagnosis not present

## 2015-02-28 DIAGNOSIS — R0989 Other specified symptoms and signs involving the circulatory and respiratory systems: Secondary | ICD-10-CM | POA: Insufficient documentation

## 2015-02-28 DIAGNOSIS — M47894 Other spondylosis, thoracic region: Secondary | ICD-10-CM | POA: Insufficient documentation

## 2015-02-28 DIAGNOSIS — I6523 Occlusion and stenosis of bilateral carotid arteries: Secondary | ICD-10-CM | POA: Diagnosis not present

## 2015-02-28 HISTORY — DX: Reserved for inherently not codable concepts without codable children: IMO0001

## 2015-02-28 LAB — COMPREHENSIVE METABOLIC PANEL
ALT: 61 U/L — ABNORMAL HIGH (ref 14–54)
AST: 48 U/L — AB (ref 15–41)
Albumin: 3.9 g/dL (ref 3.5–5.0)
Alkaline Phosphatase: 94 U/L (ref 38–126)
Anion gap: 9 (ref 5–15)
BILIRUBIN TOTAL: 0.3 mg/dL (ref 0.3–1.2)
BUN: 26 mg/dL — AB (ref 6–20)
CHLORIDE: 105 mmol/L (ref 101–111)
CO2: 27 mmol/L (ref 22–32)
CREATININE: 0.9 mg/dL (ref 0.44–1.00)
Calcium: 10.1 mg/dL (ref 8.9–10.3)
Glucose, Bld: 116 mg/dL — ABNORMAL HIGH (ref 65–99)
POTASSIUM: 5.1 mmol/L (ref 3.5–5.1)
Sodium: 141 mmol/L (ref 135–145)
TOTAL PROTEIN: 6.3 g/dL — AB (ref 6.5–8.1)

## 2015-02-28 LAB — CBC
HEMATOCRIT: 38.2 % (ref 36.0–46.0)
Hemoglobin: 12.5 g/dL (ref 12.0–15.0)
MCH: 30.5 pg (ref 26.0–34.0)
MCHC: 32.7 g/dL (ref 30.0–36.0)
MCV: 93.2 fL (ref 78.0–100.0)
PLATELETS: 260 10*3/uL (ref 150–400)
RBC: 4.1 MIL/uL (ref 3.87–5.11)
RDW: 13.3 % (ref 11.5–15.5)
WBC: 8.4 10*3/uL (ref 4.0–10.5)

## 2015-02-28 LAB — SURGICAL PCR SCREEN
MRSA, PCR: NEGATIVE
STAPHYLOCOCCUS AUREUS: POSITIVE — AB

## 2015-02-28 NOTE — Pre-Procedure Instructions (Signed)
Ilyanna Baillargeon [facility]  02/28/2015      [id number] [place],  [place] [phone] [facility] [address] AT [facility] [address] [place] [address] Phone: [phone] Fax: [phone]  [facility] [phone] - [address] [place] - [address] [place] [address] Phone: [phone] Fax: [phone]  [id number] - [address] [place]), [place] - [address] [place] [address] Phone: [phone] Fax: [phone]    Your procedure is scheduled on  Friday  03/03/15   Report to [facility] Admitting at 1030 A.M.  Call this number if you have problems the morning of surgery:  [phone]   Remember:  Do not eat food or drink liquids after midnight.  Take these medicines the morning of surgery with A SIP OF WATER   HYDROCODONE IF NEEDED, METOPROLOL (LOPRESSOR), SERTRALINE (ZOLOFT)                                                                                                                                          (STOP MULTIVITAMIN, MELOXICAM/ MOBIC, VITAMINS, HERBAL MEDICINES, IBUPROFEN/ ADVIL/ MOTRIN. GOODY POWDERS/ BC'S)  Do not wear jewelry, make-up or nail polish.  Do not wear lotions, powders, or perfumes.  You may wear deodorant.  Do not shave 48 hours prior to surgery.  Men may shave face and neck.  Do not bring valuables to the hospital.  [facility] is not responsible for any belongings or valuables.  Contacts, dentures or bridgework may not be worn into surgery.  Leave your suitcase in the car.  After surgery it may be brought to your room.  For patients admitted to the hospital, discharge time will be determined by your treatment team.  Patients discharged the day of surgery will not be allowed to drive home.   Name and phone number of your driver:    Special instructions:  [facility] - Preparing for Surgery  Before surgery, you can play an important role.  Because skin is not  sterile, your skin needs to be as free of germs as possible.  You can reduce the number of germs on you skin by washing with CHG (chlorahexidine gluconate) soap before surgery.  CHG is an antiseptic cleaner which kills germs and bonds with the skin to continue killing germs even after washing.  Please DO NOT use if you have an allergy to CHG or antibacterial soaps.  If your skin becomes reddened/irritated stop using the CHG and inform your nurse when you arrive at Short Stay.  Do not shave (including legs and underarms) for at least 48 hours prior to the first CHG shower.  You may shave your face.  Please follow these instructions carefully:   1.  Shower with CHG Soap the night before surgery and the  morning of Surgery.  2.  If you choose to wash your hair, wash your hair first as usual with your       normal shampoo.  3.  After you shampoo, rinse your hair and body thoroughly to remove the                      Shampoo.  4.  Use CHG as you would any other liquid soap.  You can apply chg directly       to the skin and wash gently with scrungie or a clean washcloth.  5.  Apply the CHG Soap to your body ONLY FROM THE NECK DOWN.        Do not use on open wounds or open sores.  Avoid contact with your eyes,       ears, mouth and genitals (private parts).  Wash genitals (private parts)       with your normal soap.  6.  Wash thoroughly, paying special attention to the area where your surgery        will be performed.  7.  Thoroughly rinse your body with warm water from the neck down.  8.  DO NOT shower/wash with your normal soap after using and rinsing off       the CHG Soap.  9.  Pat yourself dry with a clean towel.            10.  Wear clean pajamas.            11.  Place clean sheets on your bed the night of your first shower and do not        sleep with pets.  Day of Surgery  Do not apply any lotions/deoderants the morning of surgery.  Please wear clean clothes to  the hospital/surgery center.    Please read over the following fact sheets that you were given. Pain Booklet, Coughing and Deep Breathing, MRSA Information and Surgical Site Infection Prevention

## 2015-02-28 NOTE — Telephone Encounter (Signed)
Call report from [place] at [facility] with results for carotid ultrasound.  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  Patient is asymptomatic.  She is scheduled for back surgery on 03/03/15.

## 2015-03-01 ENCOUNTER — Encounter (HOSPITAL_COMMUNITY): Payer: Self-pay | Admitting: Emergency Medicine

## 2015-03-01 ENCOUNTER — Other Ambulatory Visit: Payer: Self-pay | Admitting: Internal Medicine

## 2015-03-01 ENCOUNTER — Encounter: Payer: Self-pay | Admitting: Vascular Surgery

## 2015-03-01 DIAGNOSIS — I6521 Occlusion and stenosis of right carotid artery: Secondary | ICD-10-CM

## 2015-03-01 NOTE — Telephone Encounter (Signed)
Call pt-  I need to have her seen by a cardiologist. Her ultrasound showed 80-99 % blockage in the right carotid. We may have to delay her back surgery.

## 2015-03-01 NOTE — Progress Notes (Signed)
Anesthesia Chart Review:  Pt is a 71 year old female scheduled for L3-4 central lumbar laminectomy, L4-5 microdiscectomy, L knee intraarticular steroid injection on 03/03/2015 with Dr. Otelia Sergeant.   PCP is Nicki Reaper, NP.   PMH includes:  HTN, MVP, heart murmur. Hard of hearing. Never smoker. BMI 26.   Medications include: metoprolol  Preoperative labs reviewed.   Chest x-ray 02/28/15 reviewed. No active disease. Mild degenerative changes mid thoracic spine.  EKG 02/28/15: Sinus bradycardia (57 bpm).   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 Renold Don. Pt will need vascular surgery eval prior to surgery. Left voicemail message for Sherrie in Dr. Barbaraann Faster office.   Rica Mast, FNP-BC [facility] Short Stay Surgical Center/Anesthesiology Phone: [phone] 03/01/2015 12:20 PM

## 2015-03-01 NOTE — Telephone Encounter (Signed)
Pt is aware per Shirlee Limerick

## 2015-03-02 NOTE — Progress Notes (Signed)
Call to Dr. Barbaraann Faster office, spoke with Cordelia Pen, she states this pt.'s case has been cancelled.

## 2015-03-03 ENCOUNTER — Encounter (HOSPITAL_COMMUNITY): Admission: RE | Payer: Self-pay | Source: Ambulatory Visit

## 2015-03-03 ENCOUNTER — Ambulatory Visit (INDEPENDENT_AMBULATORY_CARE_PROVIDER_SITE_OTHER): Payer: Medicare Other | Admitting: Vascular Surgery

## 2015-03-03 ENCOUNTER — Ambulatory Visit (HOSPITAL_COMMUNITY): Admission: RE | Admit: 2015-03-03 | Payer: Medicare Other | Source: Ambulatory Visit | Admitting: Specialist

## 2015-03-03 ENCOUNTER — Encounter: Payer: Self-pay | Admitting: Vascular Surgery

## 2015-03-03 VITALS — BP 138/50 | HR 64 | Temp 98.0°F | Resp 16 | Ht 65.0 in | Wt 156.0 lb

## 2015-03-03 DIAGNOSIS — I779 Disorder of arteries and arterioles, unspecified: Secondary | ICD-10-CM | POA: Diagnosis not present

## 2015-03-03 DIAGNOSIS — I739 Peripheral vascular disease, unspecified: Principal | ICD-10-CM

## 2015-03-03 SURGERY — LUMBAR LAMINECTOMY/DECOMPRESSION MICRODISCECTOMY
Anesthesia: General

## 2015-03-03 NOTE — Progress Notes (Signed)
Filed Vitals:   03/03/15 1507 03/03/15 1509 03/03/15 1515  BP: 138/58 142/59 138/50  Pulse: 63 63 64  Temp:  98 F (36.7 C)   TempSrc:  Oral   Resp:  16   Height:   (1.651 m)   Weight:  156 lb (70.761 kg)   SpO2:  96%

## 2015-03-03 NOTE — Progress Notes (Signed)
Referring: Lorre Munroe, NP [address] [place], [place] [address]  Patient name: Heather [name] MRN: [id number] DOB: 07-Nov-1945 Sex: female  REASON FOR CONSULT: Carotid stenosis  HPI: Heather [name] is a 70 y.o. female, who presents for evaluation of right internal carotid artery stenosis. She recently went to her primary care provider Nicki Reaper, NP where a right carotid bruit was detected. She underwent carotid duplex and was found to have an 80-99% right ICA stenosis. She has 40-59% left ICA stenosis. She was originally scheduled for back surgery with Dr. Otelia Sergeant today. This was canceled secondary to her carotid duplex findings.  The patient denies any history of amaurosis fugax, hemiplegia, or receptive/expressive aphasia. The patient denies any history of stroke or TIA symptoms. The patient has mitral valve prolapse. She has not been followed by a cardiologist. She is not diabetic. She has hypertension. She has hyperlipidemia but is not on a statin. She does not take a daily aspirin. She has never been a smoker. She denies a history of CAD.   She has a family history of CVA and CAD.  She notes seldom sharp chest pain that occurs once a month. This usually resolves in under minute. This can be associated with exercise or at rest. She reports dyspnea on exertion.   Past Medical History  Diagnosis Date  . MVP (mitral valve prolapse)     echo 1986-mild  . Hypertension   . Depression   . Anxiety   . 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 Wears glasses   . Heart murmur   . HOH (hard of hearing)     left  . Chicken pox   . Shortness of breath dyspnea     WITH EXERTION      Family History  Problem Relation Age of Onset  . Osteoporosis Mother   . Heart disease Mother   . Hypertension Mother   . Hyperlipidemia Mother   . Stroke Mother   . Arthritis Mother   . Diabetes Father     . Heart attack Father   . Colon cancer Neg Hx   . Cancer Sister     breast    SOCIAL HISTORY: Social History   Social History  . Marital Status: Divorced    Spouse Name: N/A  . Number of Children: N/A  . Years of Education: N/A   Occupational History  . Not on file.   Social History Main Topics  . Smoking status: Never Smoker   . Smokeless tobacco: Never Used  . Alcohol Use: 3.6 oz/week    6 Cans of beer per week     Comment: occasional  . Drug Use: No  . Sexual Activity: Yes   Other Topics Concern  . Not on file   Social History Narrative    Allergies  Allergen Reactions  . Sulfamethoxazole Swelling    Current Outpatient Prescriptions  Medication Sig Dispense Refill  . ALPRAZolam (XANAX) 0.25 MG tablet TAKE 1 TABLET BYMOUTH DAILY AS NEEDED 30 tablet 0  . Ascorbic Acid (VITAMIN C) 1000 MG tablet Take 1,000 mg by mouth daily.    . Calcium Carbonate-Vitamin D (CALTRATE 600+D) 600-400 MG-UNIT per tablet Take 1 tablet by mouth daily.      . Cholecalciferol (VITAMIN D3) 1000 UNITS CAPS Take by mouth.    . gabapentin (NEURONTIN) 100 MG capsule Take 100 mg by mouth at bedtime.     Marland Kitchen  HYDROcodone-acetaminophen (NORCO/VICODIN) 5-325 MG tablet Take 1-2 tablets by mouth every 6 (six) hours as needed.     . lidocaine (XYLOCAINE) 2 % jelly Apply 1 application topically as needed.     . loratadine (CLARITIN) 10 MG tablet Take 10 mg by mouth daily.      . meloxicam (MOBIC) 15 MG tablet Take 15 mg by mouth daily.     . metoprolol (LOPRESSOR) 50 MG tablet TAKE 1 TABLET (50 MG TOTAL) BY MOUTH DAILY. 90 tablet 1  . Multiple Vitamin (MULTIVITAMIN) tablet Take 1 tablet by mouth daily.      . sertraline (ZOLOFT) 50 MG tablet TAKE 2 TABLETS BY MOUTH EVERY DAY 180 tablet 2  . traMADol (ULTRAM) 50 MG tablet Take 50-100 mg by mouth every 6 (six) hours as needed. Reported on 02/21/2015    . valACYclovir (VALTREX) 500 MG tablet TAKE 1 TABLET BY MOUTH DAILY CAN IN CREASE TO 2 FOR 5 DAYS IN  THE EVENT OF RECURRENCE (Patient taking differently: TAKE 1 TABLET BY MOUTH DAILY CAN IN CREASE TO 2 FOR 5 DAYS IN THE EVENT OF RECURRENCE AS NEEDED) 30 tablet 12   No current facility-administered medications for this visit.    REVIEW OF SYSTEMS:   denotes positive finding,  denotes negative finding Cardiac  Comments:  Chest pain or chest pressure: x   Shortness of breath upon exertion: x   Short of breath when lying flat:    Irregular heart rhythm:        Vascular    Pain in calf, thigh, or hip brought on by ambulation:    Pain in feet at night that wakes you up from your sleep:     Blood clot in your veins:    Leg swelling:         Pulmonary    Oxygen at home:    Productive cough:     Wheezing:         Neurologic    Sudden weakness in arms or legs:     Sudden numbness in arms or legs:     Sudden onset of difficulty speaking or slurred speech:    Temporary loss of vision in one eye:     Problems with dizziness:         Gastrointestinal    Blood in stool:     Vomited blood:         Genitourinary    Burning when urinating:     Blood in urine:        Psychiatric    Major depression:         Hematologic    Bleeding problems:    Problems with blood clotting too easily:        Skin    Rashes or ulcers:        Constitutional    Fever or chills:      PHYSICAL EXAM: Filed Vitals:   03/03/15 1507 03/03/15 1509 03/03/15 1515  BP: 138/58 142/59 138/50  Pulse: 63 63 64  Temp:  98 F (36.7 C)   TempSrc:  Oral   Resp:  16   Height:   (1.651 m)   Weight:  156 lb (70.761 kg)   SpO2:  96%     GENERAL: The patient is a well-nourished female, in no acute distress. The vital signs are documented above. CARDIAC: There is a regular rate and rhythm. Right carotid bruit. VASCULAR: 2+ radial pulses and dorsalis pedis pulses bilaterally. PULMONARY:  There is good air exchange bilaterally without wheezing or rales. MUSCULOSKELETAL: There are no major deformities  or cyanosis. NEUROLOGIC: No focal weakness or paresthesias are detected. SKIN: There are no ulcers or rashes noted. HEAD: /AT EAR/NOSE/THROAT: Hearing grossly intact, nares without erythema or drainage, oropharynx without Erythema/Exudate, Mallampati score: 3 NECK: Supple, no nuchal rigidity, no palpable LAD PSYCHIATRIC: Judgment intact, Mood & affect appropriate for pt's clinical situation LYMPH:  No Cervical, Axillary, or Inguinal lymphadenopathy     MEDICAL ISSUES:  Asymptomatic right carotid stenosis  The patient was originally scheduled for back surgery with Dr. Otelia Sergeant. This was canceled after an 80-99% right internal carotid stenosis was found on carotid duplex. On SonoSite exam today, her carotid disease does not appear to be as high as 80-99%. We'll have the patient return next week for repeat carotid duplex. If this confirms a high-grade internal carotid artery stenosis, will send her to cardiology for preoperative clearance given history of mitral valve prolapse. We will set her up with a cardiologist. The patient has been started on maximal medical management with a baby aspirin and Lipitor 10 mg.    Maris Berger, PA-C [facility] [phone]  Addendum  I have independently interviewed and examined the patient, and I agree with the physician assistant's findings.  Pt is asx.  I hear a transmitted MVP murmur in her R neck.  I interrogated the R carotid artery with a Sonosite, and on B-mode I can't see a segment of the R ICA that would be >80%.  Will get a repeat study in next week.  If confirmed, pt will need to consider R CEA for asx RICA >80% per ACAS.  Given pt's cardiac history, preoperative evaluation will be needed to clear Anesthesia.  Next step will depend on duplex findings.  Leonides Sake, MD [facility] [place] Office: [phone] Pager: [phone]  03/03/2015, 5:37 PM

## 2015-03-06 ENCOUNTER — Ambulatory Visit (HOSPITAL_COMMUNITY)
Admission: RE | Admit: 2015-03-06 | Discharge: 2015-03-06 | Disposition: A | Discharge status: Home / Self Care | Payer: Medicare Other | Source: Ambulatory Visit | Attending: Surgery | Admitting: Surgery

## 2015-03-06 ENCOUNTER — Other Ambulatory Visit: Payer: Self-pay | Admitting: *Deleted

## 2015-03-06 DIAGNOSIS — I10 Essential (primary) hypertension: Secondary | ICD-10-CM | POA: Diagnosis not present

## 2015-03-06 DIAGNOSIS — I6523 Occlusion and stenosis of bilateral carotid arteries: Secondary | ICD-10-CM

## 2015-03-08 ENCOUNTER — Ambulatory Visit (INDEPENDENT_AMBULATORY_CARE_PROVIDER_SITE_OTHER): Payer: Medicare Other | Admitting: Cardiovascular Disease

## 2015-03-08 ENCOUNTER — Encounter: Payer: Self-pay | Admitting: Cardiovascular Disease

## 2015-03-08 VITALS — BP 170/60 | HR 62 | Ht 65.0 in | Wt 159.0 lb

## 2015-03-08 DIAGNOSIS — I779 Disorder of arteries and arterioles, unspecified: Secondary | ICD-10-CM | POA: Diagnosis not present

## 2015-03-08 DIAGNOSIS — R0789 Other chest pain: Secondary | ICD-10-CM | POA: Diagnosis not present

## 2015-03-08 DIAGNOSIS — E785 Hyperlipidemia, unspecified: Secondary | ICD-10-CM | POA: Diagnosis not present

## 2015-03-08 DIAGNOSIS — I739 Peripheral vascular disease, unspecified: Secondary | ICD-10-CM

## 2015-03-08 DIAGNOSIS — I10 Essential (primary) hypertension: Secondary | ICD-10-CM

## 2015-03-08 MED ORDER — METOPROLOL TARTRATE 25 MG PO TABS: 25.0000 mg | ORAL_TABLET | Freq: Two times a day (BID) | ORAL | Status: DC

## 2015-03-08 MED ORDER — LOSARTAN POTASSIUM 50 MG PO TABS: 50.0000 mg | ORAL_TABLET | Freq: Every day | ORAL | Status: DC

## 2015-03-08 MED ORDER — ASPIRIN EC 81 MG PO TBEC: 81.0000 mg | DELAYED_RELEASE_TABLET | Freq: Every day | ORAL | Status: AC

## 2015-03-08 MED ORDER — ATORVASTATIN CALCIUM 40 MG PO TABS: 40.0000 mg | ORAL_TABLET | Freq: Every day | ORAL | Status: DC

## 2015-03-08 NOTE — Progress Notes (Signed)
Cardiology Office Note   Date:  03/08/2015   ID:  Heather, [name] 06-24-1945, MRN [id number]  PCP:  Nicki Reaper, NP  Cardiologist:   Vesta Mixer, MD   Chief Complaint  Patient presents with  . Hypertension     Problem List 1. Mitral valve prolapse 2. Essential HTN 3 Carotid artery disease  History of Present Illness: Heather [name] is a 70 y.o. female who presents for pre op evaluation prior to having carotid artery endarterectomy She has a history of mitral valve prolapse and has previously seen Dr. Aleen Campi. She's not seen a cardiologist for years. She has had some tachypalpitations in the past.  She's had some hyperlipidemia. She also has hypertension. She does not take her BP at home but thinks todays elevated BP is more due to white coat HTN  She has had some exertional chest tightness -  Last for several minutes ( or until she stops walking )  Has been going on for several months   Eats lots of processed foods and fast foods.      Past Medical History  Diagnosis Date  . MVP (mitral valve prolapse)     echo 1986-mild  . Hypertension   . Depression   . Anxiety   . 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 Wears glasses   . Heart murmur   . HOH (hard of hearing)     left  . Chicken pox   . Shortness of breath dyspnea     WITH EXERTION      Past Surgical History  Procedure Laterality Date  . Tubal ligation  1995  . Tibia fracture surgery  1975    rt  . Nasal sinus surgery  1975  . Plantar fascia release  1968    both feet  . Tonsillectomy  1951  . Carpal tunnel release Right 03/31/2013    Procedure: RIGHT CARPAL TUNNEL RELEASE;  Surgeon: Nicki Reaper, MD;  Location:  SURGERY CENTER;  Service: Orthopedics;  Laterality: Right;  . Rotator cuff repair Right 1998     Current Outpatient Prescriptions  Medication Sig Dispense Refill  . ALPRAZolam  (XANAX) 0.25 MG tablet TAKE 1 TABLET BYMOUTH DAILY AS NEEDED 30 tablet 0  . Ascorbic Acid (VITAMIN C) 1000 MG tablet Take 1,000 mg by mouth daily.    . Calcium Carbonate-Vitamin D (CALTRATE 600+D) 600-400 MG-UNIT per tablet Take 1 tablet by mouth daily.      . Cholecalciferol (VITAMIN D3) 1000 UNITS CAPS Take by mouth.    . gabapentin (NEURONTIN) 100 MG capsule Take 100 mg by mouth at bedtime.     Marland Kitchen HYDROcodone-acetaminophen (NORCO/VICODIN) 5-325 MG tablet Take 1-2 tablets by mouth every 6 (six) hours as needed.     . lidocaine (XYLOCAINE) 2 % jelly Apply 1 application topically as needed.     . loratadine (CLARITIN) 10 MG tablet Take 10 mg by mouth daily.      . meloxicam (MOBIC) 15 MG tablet Take 15 mg by mouth daily.     . metoprolol (LOPRESSOR) 50 MG tablet TAKE 1 TABLET (50 MG TOTAL) BY MOUTH DAILY. 90 tablet 1  . Multiple Vitamin (MULTIVITAMIN) tablet Take 1 tablet by mouth daily.      . sertraline (ZOLOFT) 50 MG tablet TAKE 2 TABLETS BY MOUTH EVERY DAY 180 tablet 2  . traMADol (ULTRAM) 50 MG tablet Take 50-100 mg  by mouth every 6 (six) hours as needed. Reported on 02/21/2015    . valACYclovir (VALTREX) 500 MG tablet TAKE 1 TABLET BY MOUTH DAILY CAN IN CREASE TO 2 FOR 5 DAYS IN THE EVENT OF RECURRENCE (Patient taking differently: TAKE 1 TABLET BY MOUTH DAILY CAN IN CREASE TO 2 FOR 5 DAYS IN THE EVENT OF RECURRENCE AS NEEDED) 30 tablet 12   No current facility-administered medications for this visit.    Allergies:   Sulfamethoxazole    Social History:  The patient  reports that she has never smoked. She has never used smokeless tobacco. She reports that she drinks about 3.6 oz of alcohol per week. She reports that she does not use illicit drugs.   Family History:  The patient's family history includes Arthritis in her mother; Cancer in her sister; Diabetes in her father; Heart attack in her father; Heart disease in her mother; Hyperlipidemia in her mother; Hypertension in her mother;  Osteoporosis in her mother; Stroke in her mother. There is no history of Colon cancer.    ROS:  Please see the history of present illness.    Review of Systems: Constitutional:  denies fever, chills, diaphoresis, appetite change and fatigue.  HEENT: denies photophobia, eye pain, redness, hearing loss, ear pain, congestion, sore throat, rhinorrhea, sneezing, neck pain, neck stiffness and tinnitus.  Respiratory: denies SOB, DOE, cough, chest tightness, and wheezing.  Cardiovascular: denies chest pain, palpitations and leg swelling.  Gastrointestinal: denies nausea, vomiting, abdominal pain, diarrhea, constipation, blood in stool.  Genitourinary: denies dysuria, urgency, frequency, hematuria, flank pain and difficulty urinating.  Musculoskeletal: denies  myalgias, back pain, joint swelling, arthralgias and gait problem.   Skin: denies pallor, rash and wound.  Neurological: denies dizziness, seizures, syncope, weakness, light-headedness, numbness and headaches.   Hematological: denies adenopathy, easy bruising, personal or family bleeding history.  Psychiatric/ Behavioral: denies suicidal ideation, mood changes, confusion, nervousness, sleep disturbance and agitation.       All other systems are reviewed and negative.    PHYSICAL EXAM: VS:  BP 170/60 mmHg  Pulse 62  Ht  (1.651 m)  Wt 159 lb (72.122 kg)  BMI 26.46 kg/m2  SpO2 96%  LMP 12/29/2011 , BMI Body mass index is 26.46 kg/(m^2). GEN: Well nourished, well developed, in no acute distress HEENT: normal Neck: no JVD,   She has a moderate right carotid bruit, no or masses Cardiac: RRR; no murmurs, rubs, or gallops,no edema  Respiratory:  clear to auscultation bilaterally, normal work of breathing GI: soft, nontender, nondistended, + BS MS: no deformity or atrophy Skin: warm and dry, no rash Neuro:  Strength and sensation are intact Psych: normal   EKG:  EKG is not ordered today. The ekg ordered general 31st, 2017   demonstrates sinus bradycardia at 57 beats a minute. She has no ST or T wave changes.   Recent Labs: 02/28/2015: ALT 61*; BUN 26*; Creatinine, Ser 0.90; Hemoglobin 12.5; Platelets 260; Potassium 5.1; Sodium 141    Lipid Panel    Component Value Date/Time   CHOL 248* 02/21/2015 1331   TRIG 234.0* 02/21/2015 1331   HDL 74.10 02/21/2015 1331   CHOLHDL 3 02/21/2015 1331   VLDL 46.8* 02/21/2015 1331   LDLCALC 139* 11/21/2014 0957   LDLDIRECT 132.0 02/21/2015 1331      Wt Readings from Last 3 Encounters:  03/08/15 159 lb (72.122 kg)  03/03/15 156 lb (70.761 kg)  02/28/15 157 lb 3.2 oz (71.305 kg)  Other studies Reviewed: Additional studies/ records that were reviewed today include: . Review of the above records demonstrates:    ASSESSMENT AND PLAN:  1.  Carotid artery disease - she seems to be doing fairly well. She does have some episodes of chest pain. I think that we should proceed with a Lexiscan Myoview study prior to getting surgery. We'll add aspirin 81 mg a day.  2.Essential hypertension - her blood pressure remains elevated. We had long discussion about restricting salt. We will add losartan 50 mg a day. We'll check a basic profile in 3 weeks. We have also split her metoprolol and she'll take 25 mg twice a day.  3. Hyperlipidemia - she has significant hyperlipidemia and evidence of carotid artery disease. We'll go ahead and start her on atorvastatin 40 mg a day. We'll check fasting labs in 3 months.     Current medicines are reviewed at length with the patient today.  The patient does not have concerns regarding medicines.  The following changes have been made:  no change  Labs/ tests ordered today include:  No orders of the defined types were placed in this encounter.     Disposition:   FU with me in 3 months      Chaniya Genter, Deloris Ping, MD  03/08/2015 11:12 AM    [facility] [address] [place], [place], [place]  [address] Phone: [phone]; Fax: [phone]   [facility]  [address] [place], [place]  [address] [phone]   Fax [phone]

## 2015-03-08 NOTE — Patient Instructions (Signed)
Medication Instructions:  START Atorvastatin 40 mg once daily for high cholestserol START Aspirin 81 mg once daily for heart health/history of carotid artery disease START Losartan 50 mg once daily for high blood pressure CHANGE Metoprolol to 25 mg twice daily   Labwork: Your physician recommends that you return for lab work in: 3 weeks for basic metabolic panel  Your physician recommends that you return for lab work in: 3 months on the day of or a few days before your office visit with Dr. Elease Hashimoto.  You will need to FAST for this appointment - nothing to eat or drink after midnight the night before except water.   Testing/Procedures: Your physician has requested that you have a lexiscan myoview. For further information please visit [email or web address] Please follow instruction sheet, as given.   Follow-Up: Your physician recommends that you schedule a follow-up appointment in: 3 months with Dr. Elease Hashimoto   If you need a refill on your cardiac medications before your next appointment, please call your pharmacy.   Thank you for choosing [facility]! Eligha Bridegroom, RN [phone]

## 2015-03-09 ENCOUNTER — Other Ambulatory Visit: Payer: Self-pay | Admitting: Internal Medicine

## 2015-03-09 ENCOUNTER — Telehealth (HOSPITAL_COMMUNITY): Payer: Self-pay | Admitting: *Deleted

## 2015-03-09 NOTE — Telephone Encounter (Signed)
Left message on voicemail in reference to upcoming appointment scheduled for 03/15/15. Phone number given for a call back so details instructions can be given. Chanie J Aleena Kirkeby, RN 

## 2015-03-10 ENCOUNTER — Telehealth: Payer: Self-pay

## 2015-03-10 NOTE — Telephone Encounter (Signed)
Ok to phone in Xanax 

## 2015-03-10 NOTE — Telephone Encounter (Signed)
I have seen her notes with Vascular and Cardiology. No need to start Zocor as she is already on statin by cardiology.

## 2015-03-10 NOTE — Telephone Encounter (Signed)
Spoke to pt and she is aware as instucted

## 2015-03-10 NOTE — Telephone Encounter (Signed)
Last filled 02/01/15--please advise

## 2015-03-10 NOTE — Telephone Encounter (Signed)
Pt wanted R Baity NP to be aware that pt has seen vascular dr and pt has blockage in rt artery in neck and pt was referred to cardiologist and pt is scheduled to have stress test 03/15/15. Pt just got letter from Pamala Hurry NP that pt is to start Zocor 10 mg; pt said the cardiologist has already started pt on cholesterol med and restricted pts diet; pt does not know the name of the med yet because she has not picked up rx yet. Pt's back surgery was cancelled. Pt will keep R Baity NP updated when she finds out stress test results. This is FYI to Pamala Hurry NP unless Rene Kocher needs to contact pt.

## 2015-03-15 ENCOUNTER — Ambulatory Visit (HOSPITAL_COMMUNITY): Payer: Medicare Other | Attending: Cardiovascular Disease

## 2015-03-15 DIAGNOSIS — R0789 Other chest pain: Secondary | ICD-10-CM | POA: Diagnosis not present

## 2015-03-15 DIAGNOSIS — I10 Essential (primary) hypertension: Secondary | ICD-10-CM | POA: Insufficient documentation

## 2015-03-15 DIAGNOSIS — R0602 Shortness of breath: Secondary | ICD-10-CM

## 2015-03-15 LAB — MYOCARDIAL PERFUSION IMAGING
CHL CUP NUCLEAR SRS: 4
CHL CUP NUCLEAR SSS: 4
CSEPPHR: 81 {beats}/min
LHR: 0.35
LV dias vol: 88 mL
LVSYSVOL: 37 mL
NUC STRESS TID: 1.08
Rest HR: 56 {beats}/min
SDS: 0

## 2015-03-15 MED ORDER — REGADENOSON 0.4 MG/5ML IV SOLN
0.4000 mg | Freq: Once | INTRAVENOUS | Status: AC
Start: 2015-03-15 — End: 2015-03-15
  Administered 2015-03-15: 0.4 mg via INTRAVENOUS

## 2015-03-15 MED ORDER — TECHNETIUM TC 99M SESTAMIBI GENERIC - CARDIOLITE
10.3000 | Freq: Once | INTRAVENOUS | Status: AC | PRN
  Administered 2015-03-15: 10 via INTRAVENOUS

## 2015-03-15 MED ORDER — AMINOPHYLLINE 25 MG/ML IV SOLN
75.0000 mg | Freq: Once | INTRAVENOUS | Status: AC
  Administered 2015-03-15: 75 mg via INTRAVENOUS

## 2015-03-15 MED ORDER — TECHNETIUM TC 99M SESTAMIBI GENERIC - CARDIOLITE
32.4000 | Freq: Once | INTRAVENOUS | Status: AC | PRN
  Administered 2015-03-15: 32 via INTRAVENOUS

## 2015-03-29 ENCOUNTER — Other Ambulatory Visit (INDEPENDENT_AMBULATORY_CARE_PROVIDER_SITE_OTHER): Payer: Medicare Other | Admitting: *Deleted

## 2015-03-29 DIAGNOSIS — E785 Hyperlipidemia, unspecified: Secondary | ICD-10-CM | POA: Diagnosis not present

## 2015-03-29 DIAGNOSIS — I10 Essential (primary) hypertension: Secondary | ICD-10-CM

## 2015-03-29 LAB — BASIC METABOLIC PANEL
BUN: 26 mg/dL — ABNORMAL HIGH (ref 7–25)
CALCIUM: 9.7 mg/dL (ref 8.6–10.4)
CHLORIDE: 102 mmol/L (ref 98–110)
CO2: 25 mmol/L (ref 20–31)
Creat: 0.95 mg/dL — ABNORMAL HIGH (ref 0.60–0.93)
GLUCOSE: 115 mg/dL — AB (ref 65–99)
Potassium: 5.2 mmol/L (ref 3.5–5.3)
SODIUM: 138 mmol/L (ref 135–146)

## 2015-04-06 ENCOUNTER — Other Ambulatory Visit: Payer: Self-pay | Admitting: Internal Medicine

## 2015-04-06 NOTE — Telephone Encounter (Signed)
Ok to phone in Xanax 

## 2015-04-06 NOTE — Telephone Encounter (Signed)
Last filled 03/10/15--please advise

## 2015-04-07 NOTE — Telephone Encounter (Signed)
Rx called in to pharmacy. 

## 2015-04-13 ENCOUNTER — Telehealth: Payer: Self-pay | Admitting: Vascular Surgery

## 2015-04-13 NOTE — Telephone Encounter (Signed)
-----   Message from Phillips Odorarol S Pullins, RN sent at 04/12/2015 12:27 PM EDT ----- Regarding: needs office appt. with Dr. Imogene Burnhen to plan for Carotid Surgery Contact: [phone] Please schedule for office appt. with [facility] at his next earliest available to discuss (R) CEA;  Last OV on 2/3, pt. Was to repeat Carotid US (done 2/6), obtain Cardiac Clearance (Myoview done 2/15), and then be scheduled for surgery. Pt. Has questions and requested appt. With [facility].

## 2015-04-13 NOTE — Telephone Encounter (Signed)
Spoke with pt to schedule, dpm °

## 2015-04-19 ENCOUNTER — Encounter: Payer: Self-pay | Admitting: Vascular Surgery

## 2015-04-26 ENCOUNTER — Encounter: Payer: Self-pay | Admitting: Vascular Surgery

## 2015-04-27 ENCOUNTER — Ambulatory Visit (INDEPENDENT_AMBULATORY_CARE_PROVIDER_SITE_OTHER): Payer: Medicare Other | Admitting: Vascular Surgery

## 2015-04-27 ENCOUNTER — Encounter: Payer: Self-pay | Admitting: Vascular Surgery

## 2015-04-27 ENCOUNTER — Other Ambulatory Visit: Payer: Self-pay

## 2015-04-27 VITALS — BP 132/57 | HR 66 | Temp 98.7°F | Resp 14 | Ht 65.0 in | Wt 155.0 lb

## 2015-04-27 DIAGNOSIS — I779 Disorder of arteries and arterioles, unspecified: Secondary | ICD-10-CM | POA: Diagnosis not present

## 2015-04-27 DIAGNOSIS — I739 Peripheral vascular disease, unspecified: Principal | ICD-10-CM

## 2015-04-27 NOTE — Progress Notes (Signed)
Established Carotid Patient  History of Present Illness  Heather [name] is a 70 y.o. (September 04, 1945) female who presents with chief complaint: return from cardiology.  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  I had the R carotid repeated: 80-99%.  Patient has no history of TIA or stroke symptom.  The patient has never had amaurosis fugax or monocular blindness.  The patient has never had facial drooping or hemiplegia.  The patient has never had receptive or expressive aphasia.    Her stress test was found to be: low risk.   Past Medical History  Diagnosis Date  . MVP (mitral valve prolapse)     echo 1986-mild  . Hypertension   . Depression   . Anxiety   . 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 Wears glasses   . Heart murmur   . HOH (hard of hearing)     left  . Chicken pox   . Shortness of breath dyspnea     WITH EXERTION      Past Surgical History  Procedure Laterality Date  . Tubal ligation  1995  . Tibia fracture surgery  1975    rt  . Nasal sinus surgery  1975  . Plantar fascia release  1968    both feet  . Tonsillectomy  1951  . Carpal tunnel release Right 03/31/2013    Procedure: RIGHT CARPAL TUNNEL RELEASE;  Surgeon: Nicki Reaper, MD;  Location: [facility];  Service: Orthopedics;  Laterality: Right;  . Rotator cuff repair Right 1998    Social History   Social History  . Marital Status: Divorced    Spouse Name: N/A  . Number of Children: N/A  . Years of Education: N/A   Occupational History  . Not on file.   Social History Main Topics  . Smoking status: Never Smoker   . Smokeless tobacco: Never Used  . Alcohol Use: 3.6 oz/week    6 Cans of beer per week     Comment: occasional  . Drug Use: No  . Sexual Activity: Yes   Other Topics Concern  . Not on file   Social History Narrative    Family History    Problem Relation Age of Onset  . Osteoporosis Mother   . Heart disease Mother   . Hypertension Mother   . Hyperlipidemia Mother   . Stroke Mother   . Arthritis Mother   . Diabetes Father   . Heart attack Father   . Colon cancer Neg Hx   . Cancer Sister     breast    Current Outpatient Prescriptions  Medication Sig Dispense Refill  . ALPRAZolam (XANAX) 0.25 MG tablet TAKE 1 TABLET BY MOUTH DAILY AS NEEDED 30 tablet 0  . Ascorbic Acid (VITAMIN C) 1000 MG tablet Take 1,000 mg by mouth daily.    Marland Kitchen aspirin EC 81 MG tablet Take 1 tablet (81 mg total) by mouth daily.    Marland Kitchen atorvastatin (LIPITOR) 40 MG tablet Take 1 tablet (40 mg total) by mouth daily. 30 tablet 11  . Calcium Carbonate-Vitamin D (CALTRATE 600+D) 600-400 MG-UNIT per tablet Take 1 tablet by mouth daily.      . Cholecalciferol (VITAMIN D3) 1000 UNITS CAPS Take by mouth.    . gabapentin (NEURONTIN) 100 MG capsule Take 100 mg by mouth at bedtime.     Marland Kitchen HYDROcodone-acetaminophen (NORCO/VICODIN) 5-325 MG  tablet Take 1-2 tablets by mouth every 6 (six) hours as needed.     . lidocaine (XYLOCAINE) 2 % jelly Apply 1 application topically as needed.     . loratadine (CLARITIN) 10 MG tablet Take 10 mg by mouth daily.      Marland Kitchen. losartan (COZAAR) 50 MG tablet Take 1 tablet (50 mg total) by mouth daily. 30 tablet 11  . meloxicam (MOBIC) 15 MG tablet Take 15 mg by mouth daily.     . metoprolol (LOPRESSOR) 25 MG tablet Take 1 tablet (25 mg total) by mouth 2 (two) times daily. 60 tablet 11  . Multiple Vitamin (MULTIVITAMIN) tablet Take 1 tablet by mouth daily.      . sertraline (ZOLOFT) 50 MG tablet TAKE 2 TABLETS BY MOUTH EVERY DAY 180 tablet 2  . traMADol (ULTRAM) 50 MG tablet Take 50-100 mg by mouth every 6 (six) hours as needed. Reported on 02/21/2015    . valACYclovir (VALTREX) 500 MG tablet TAKE 1 TABLET BY MOUTH DAILY CAN IN CREASE TO 2 FOR 5 DAYS IN THE EVENT OF RECURRENCE (Patient taking differently: TAKE 1 TABLET BY MOUTH DAILY CAN IN  CREASE TO 2 FOR 5 DAYS IN THE EVENT OF RECURRENCE AS NEEDED) 30 tablet 12   No current facility-administered medications for this visit.     Allergies  Allergen Reactions  . Sulfamethoxazole Swelling     REVIEW OF SYSTEMS:  (Positives checked otherwise negative)  CARDIOVASCULAR:   [ ]  chest pain,  [ ]  chest pressure,  [ ]  palpitations,  [ ]  shortness of breath when laying flat,  [ ]  shortness of breath with exertion,   [ ]  pain in feet when walking,  [ ]  pain in feet when laying flat, [ ]  history of blood clot in veins (DVT),  [ ]  history of phlebitis,  [ ]  swelling in legs,  [ ]  varicose veins  PULMONARY:   [ ]  productive cough,  [ ]  asthma,  [ ]  wheezing  NEUROLOGIC:   [ ]  weakness in arms or legs,  [ ]  numbness in arms or legs,  [ ]  difficulty speaking or slurred speech,  [ ]  temporary loss of vision in one eye,  [ ]  dizziness  HEMATOLOGIC:   [ ]  bleeding problems,  [ ]  problems with blood clotting too easily  MUSCULOSKEL:   [ ]  joint pain, [ ]  joint swelling  GASTROINTEST:   [ ]  vomiting blood,  [ ]  blood in stool     GENITOURINARY:   [ ]  burning with urination,  [ ]  blood in urine  PSYCHIATRIC:   [ ]  history of major depression  INTEGUMENTARY:   [ ]  rashes,  [ ]  ulcers  CONSTITUTIONAL:   [ ]  fever,  [ ]  chills   Physical Examination  Filed Vitals:   04/27/15 1525 04/27/15 1526  BP: 125/56 132/57  Pulse: 69 66  Temp: 98.7 F (37.1 C)   Resp: 14   Height: 5\' 5"  (1.651 m)   Weight: 155 lb (70.308 kg)   SpO2: 97%    Body mass index is 25.79 kg/(m^2).  General: A&O x 3, WDWN  Eyes: PERRLA, EOMI  Neck: Supple, no nuchal rigidity, no palpable LAD  Pulmonary: Sym exp, good air movt, CTAB, no rales, rhonchi, & wheezing  Cardiac: RRR, Nl S1, S2, no Murmurs, rubs or gallops  Vascular: Vessel Right Left  Radial Palpable Palpable  Brachial Palpable Palpable  Carotid Palpable, without bruit Palpable, without bruit  Aorta  Not  palpable N/A  Femoral Palpable Palpable  Popliteal Not palpable Not palpable  PT Palpable Palpable  DP Palpable Palpable   Gastrointestinal: soft, NTND, no G/R, no HSM, no masses, no CVAT B  Musculoskeletal: M/S 5/5 throughout , Extremities without ischemic changes   Neurologic: CN 2-12 intact , Pain and light touch intact in extremities , Motor exam as listed above   Medical Decision Making  Heather [name] is a 70 y.o. female who presents with: R asx ICA stenosis >80%., L asx ICA <80%   Based on the patient's vascular studies and examination, I have offered the patient: R CEA. I discussed with the patient the risks, benefits, and alternatives to carotid endarterectomy.   The patient is aware that the risks of carotid endarterectomy include but are not limited to: bleeding, infection, stroke, myocardial infarction, death, cranial nerve injuries both temporary and permanent, neck hematoma, possible airway compromise, labile blood pressure post-operatively, cerebral hyperperfusion syndrome, and possible need for additional interventions in the future.   The patient is aware of the risks and agrees to proceed forward with the procedure.  She is scheduled for the 5 APR 17.  I discussed in depth with the patient the nature of atherosclerosis, and emphasized the importance of maximal medical management including strict control of blood pressure, blood glucose, and lipid levels, antiplatelet agents, obtaining regular exercise, and cessation of smoking.    The patient is aware that without maximal medical management the underlying atherosclerotic disease process will progress, limiting the benefit of any interventions. The patient is currently on a statin: Lipitor. The patient is currently on an anti-platelet: ASA.  Thank you for allowing [place] to participate in this patient's care.   Leonides Sake, MD [facility] Office: [phone] Pager:  [phone]  04/27/2015, 4:27 PM

## 2015-04-29 DIAGNOSIS — I639 Cerebral infarction, unspecified: Secondary | ICD-10-CM

## 2015-04-29 HISTORY — DX: Cerebral infarction, unspecified: I63.9

## 2015-05-02 ENCOUNTER — Encounter (HOSPITAL_COMMUNITY)
Admission: RE | Admit: 2015-05-02 | Discharge: 2015-05-02 | Disposition: A | Discharge status: Home / Self Care | Payer: Medicare Other | Source: Ambulatory Visit | Attending: Vascular Surgery | Admitting: Vascular Surgery

## 2015-05-02 ENCOUNTER — Encounter (HOSPITAL_COMMUNITY): Payer: Self-pay

## 2015-05-02 HISTORY — DX: Personal history of other medical treatment: Z92.89

## 2015-05-02 LAB — TYPE AND SCREEN
ABO/RH(D): A POS
Antibody Screen: NEGATIVE

## 2015-05-02 LAB — COMPREHENSIVE METABOLIC PANEL
ALK PHOS: 120 U/L (ref 38–126)
ALT: 34 U/L (ref 14–54)
AST: 23 U/L (ref 15–41)
Albumin: 4 g/dL (ref 3.5–5.0)
Anion gap: 8 (ref 5–15)
BUN: 24 mg/dL — ABNORMAL HIGH (ref 6–20)
CALCIUM: 10.1 mg/dL (ref 8.9–10.3)
CHLORIDE: 104 mmol/L (ref 101–111)
CO2: 27 mmol/L (ref 22–32)
CREATININE: 0.96 mg/dL (ref 0.44–1.00)
GFR, EST NON AFRICAN AMERICAN: 59 mL/min — AB (ref >60)
Glucose, Bld: 105 mg/dL — ABNORMAL HIGH (ref 65–99)
Potassium: 4.8 mmol/L (ref 3.5–5.1)
Sodium: 139 mmol/L (ref 135–145)
TOTAL PROTEIN: 7 g/dL (ref 6.5–8.1)
Total Bilirubin: 0.6 mg/dL (ref 0.3–1.2)

## 2015-05-02 LAB — ABO/RH: ABO/RH(D): A POS

## 2015-05-02 LAB — CBC
HCT: 37.4 % (ref 36.0–46.0)
Hemoglobin: 12.9 g/dL (ref 12.0–15.0)
MCH: 31.9 pg (ref 26.0–34.0)
MCHC: 34.5 g/dL (ref 30.0–36.0)
MCV: 92.6 fL (ref 78.0–100.0)
PLATELETS: 240 10*3/uL (ref 150–400)
RBC: 4.04 MIL/uL (ref 3.87–5.11)
RDW: 12.9 % (ref 11.5–15.5)
WBC: 7.2 10*3/uL (ref 4.0–10.5)

## 2015-05-02 LAB — APTT: aPTT: 29 seconds (ref 24–37)

## 2015-05-02 LAB — PROTIME-INR
INR: 1.15 (ref 0.00–1.49)
PROTHROMBIN TIME: 14.8 s (ref 11.6–15.2)

## 2015-05-02 LAB — SURGICAL PCR SCREEN
MRSA, PCR: NEGATIVE
Staphylococcus aureus: POSITIVE — AB

## 2015-05-02 MED ORDER — DEXTROSE 5 % IV SOLN
1.5000 g | INTRAVENOUS | Status: AC
  Administered 2015-05-03: 1.5 g via INTRAVENOUS
  Filled 2015-05-02: qty 1.5

## 2015-05-02 MED ORDER — SODIUM CHLORIDE 0.9 % IV SOLN: INTRAVENOUS | Status: DC

## 2015-05-02 NOTE — Anesthesia Preprocedure Evaluation (Signed)
Anesthesia Evaluation  Patient identified by MRN, date of birth, ID band Patient awake    Reviewed: Allergy & Precautions, NPO status , Patient's Chart, lab work & pertinent test results, reviewed documented beta blocker date and time   Airway Mallampati: II  TM Distance: >3 FB Neck ROM: Full    Dental no notable dental hx.    Pulmonary shortness of breath,    Pulmonary exam normal breath sounds clear to auscultation       Cardiovascular hypertension, Pt. on medications and Pt. on home beta blockers + Peripheral Vascular Disease  Normal cardiovascular exam+ Valvular Problems/Murmurs  Rhythm:Regular Rate:Normal     Neuro/Psych PSYCHIATRIC DISORDERS Anxiety Depression negative neurological ROS  negative psych ROS   GI/Hepatic negative GI ROS, Neg liver ROS,   Endo/Other  negative endocrine ROS  Renal/GU negative Renal ROS     Musculoskeletal negative musculoskeletal ROS (+)   Abdominal   Peds  Hematology negative hematology ROS (+)   Anesthesia Other Findings   Reproductive/Obstetrics negative OB ROS                             Anesthesia Physical Anesthesia Plan  ASA: III  Anesthesia Plan: General   Post-op Pain Management:    Induction: Intravenous  Airway Management Planned: Oral ETT  Additional Equipment: Arterial line  Intra-op Plan:   Post-operative Plan: Extubation in OR  Informed Consent: I have reviewed the patients History and Physical, chart, labs and discussed the procedure including the risks, benefits and alternatives for the proposed anesthesia with the patient or authorized representative who has indicated his/her understanding and acceptance.   Dental advisory given  Plan Discussed with: CRNA  Anesthesia Plan Comments: (Remi gtt)        Anesthesia Quick Evaluation

## 2015-05-02 NOTE — Progress Notes (Signed)
Ms Heather [name]  Denies chest pain or discomfort, does get short of breath with exertion.Patient had  a  normal stress test.  Patient has a history of "fast heart rate", has not had a problem after being on  Metoprolol.

## 2015-05-02 NOTE — Pre-Procedure Instructions (Signed)
[name]  [facility]     Your procedure is scheduled on : Wednesday May 03, 2015 at 8:30 AM.  [facility].  Call this number if you have problems the morning of surgery: [phone]    Remember:  Do not eat food or drink liquids after midnight.  Take these medicines the morning of surgery with A SIP OF WATER : Metoprolol (Lopressor), sertraline (ZOLOFT), Alprazolam (Xanax) if needed, Hydrocodone if needed, Loratadine (Claritin), Valacyclovir (Valtrex) if needed   Stop taking any vitamins, herbal medications/supplements, NSAIDs, Ibuprofen, Advil, Motrin, Aleve, etc today   Do not wear jewelry, make-up or nail polish.  Do not wear lotions, powders, or perfumes.    Do not shave 48 hours prior to surgery.   Do not bring valuables to the hospital.  [facility] is not responsible for any belongings or valuables.  Contacts, dentures or bridgework may not be worn into surgery.  Leave your suitcase in the car.  After surgery it may be brought to your room.  For patients admitted to the hospital, discharge time will be determined by your treatment team.  Patients discharged the day of surgery will not be allowed to drive home.   Name and phone number of your driver:    Special instructions:  Shower using CHG soap the night before and the morning of your surgery  Please read over the following fact sheets that you were given. Pain Booklet, Coughing and Deep Breathing, Blood Transfusion Information, MRSA Information and Surgical Site Infection Prevention

## 2015-05-03 ENCOUNTER — Inpatient Hospital Stay (HOSPITAL_COMMUNITY)
Admission: RE | Admit: 2015-05-03 | Discharge: 2015-05-04 | DRG: 039 | Disposition: A | Discharge status: Home / Self Care | Payer: Medicare Other | Source: Ambulatory Visit | Attending: Vascular Surgery | Admitting: Vascular Surgery

## 2015-05-03 ENCOUNTER — Inpatient Hospital Stay (HOSPITAL_COMMUNITY): Payer: Medicare Other | Admitting: Anesthesiology

## 2015-05-03 ENCOUNTER — Encounter (HOSPITAL_COMMUNITY): Payer: Self-pay | Admitting: Certified Registered Nurse Anesthetist

## 2015-05-03 ENCOUNTER — Encounter (HOSPITAL_COMMUNITY)
Admission: RE | Disposition: A | Discharge status: Home / Self Care | Payer: Self-pay | Source: Ambulatory Visit | Attending: Vascular Surgery

## 2015-05-03 DIAGNOSIS — I6523 Occlusion and stenosis of bilateral carotid arteries: Principal | ICD-10-CM | POA: Diagnosis present

## 2015-05-03 DIAGNOSIS — Z7982 Long term (current) use of aspirin: Secondary | ICD-10-CM | POA: Diagnosis not present

## 2015-05-03 DIAGNOSIS — Z791 Long term (current) use of non-steroidal anti-inflammatories (NSAID): Secondary | ICD-10-CM | POA: Diagnosis not present

## 2015-05-03 DIAGNOSIS — Z79899 Other long term (current) drug therapy: Secondary | ICD-10-CM | POA: Diagnosis not present

## 2015-05-03 DIAGNOSIS — F329 Major depressive disorder, single episode, unspecified: Secondary | ICD-10-CM | POA: Diagnosis not present

## 2015-05-03 DIAGNOSIS — Z23 Encounter for immunization: Secondary | ICD-10-CM | POA: Diagnosis not present

## 2015-05-03 DIAGNOSIS — F419 Anxiety disorder, unspecified: Secondary | ICD-10-CM | POA: Diagnosis not present

## 2015-05-03 DIAGNOSIS — I739 Peripheral vascular disease, unspecified: Secondary | ICD-10-CM | POA: Diagnosis present

## 2015-05-03 DIAGNOSIS — I341 Nonrheumatic mitral (valve) prolapse: Secondary | ICD-10-CM | POA: Diagnosis present

## 2015-05-03 DIAGNOSIS — Z01812 Encounter for preprocedural laboratory examination: Secondary | ICD-10-CM

## 2015-05-03 DIAGNOSIS — H9192 Unspecified hearing loss, left ear: Secondary | ICD-10-CM | POA: Diagnosis present

## 2015-05-03 DIAGNOSIS — I10 Essential (primary) hypertension: Secondary | ICD-10-CM | POA: Diagnosis present

## 2015-05-03 DIAGNOSIS — I6521 Occlusion and stenosis of right carotid artery: Secondary | ICD-10-CM | POA: Diagnosis present

## 2015-05-03 DIAGNOSIS — I6529 Occlusion and stenosis of unspecified carotid artery: Secondary | ICD-10-CM

## 2015-05-03 DIAGNOSIS — Z882 Allergy status to sulfonamides status: Secondary | ICD-10-CM

## 2015-05-03 DIAGNOSIS — Z0183 Encounter for blood typing: Secondary | ICD-10-CM | POA: Diagnosis not present

## 2015-05-03 HISTORY — PX: ENDARTERECTOMY: SHX5162

## 2015-05-03 SURGERY — ENDARTERECTOMY, CAROTID
Anesthesia: General | Site: Neck | Laterality: Right

## 2015-05-03 MED ORDER — MORPHINE SULFATE (PF) 2 MG/ML IV SOLN
1.0000 mg | INTRAVENOUS | Status: DC | PRN
  Administered 2015-05-03 (×2): 2 mg via INTRAVENOUS
  Filled 2015-05-03 (×2): qty 1

## 2015-05-03 MED ORDER — LIDOCAINE HCL (CARDIAC) 20 MG/ML IV SOLN
INTRAVENOUS | Status: DC | PRN
  Administered 2015-05-03: 100 mg via INTRATRACHEAL

## 2015-05-03 MED ORDER — LACTATED RINGERS IV SOLN
INTRAVENOUS | Status: DC | PRN
  Administered 2015-05-03: 08:00:00 via INTRAVENOUS

## 2015-05-03 MED ORDER — MIDAZOLAM HCL 2 MG/2ML IJ SOLN
INTRAMUSCULAR | Status: AC
  Filled 2015-05-03: qty 2

## 2015-05-03 MED ORDER — CHLORHEXIDINE GLUCONATE CLOTH 2 % EX PADS: 6.0000 | MEDICATED_PAD | Freq: Once | CUTANEOUS | Status: DC

## 2015-05-03 MED ORDER — MAGNESIUM HYDROXIDE 400 MG/5ML PO SUSP: 30.0000 mL | Freq: Every day | ORAL | Status: DC | PRN

## 2015-05-03 MED ORDER — HYDRALAZINE HCL 20 MG/ML IJ SOLN: 5.0000 mg | INTRAMUSCULAR | Status: DC | PRN

## 2015-05-03 MED ORDER — ARTIFICIAL TEARS OP OINT
TOPICAL_OINTMENT | OPHTHALMIC | Status: DC | PRN
  Administered 2015-05-03: 1 via OPHTHALMIC

## 2015-05-03 MED ORDER — GUAIFENESIN-DM 100-10 MG/5ML PO SYRP: 15.0000 mL | ORAL_SOLUTION | ORAL | Status: DC | PRN

## 2015-05-03 MED ORDER — HYDROMORPHONE HCL 1 MG/ML IJ SOLN
INTRAMUSCULAR | Status: AC
  Administered 2015-05-03: 0.5 mg via INTRAVENOUS
  Filled 2015-05-03: qty 1

## 2015-05-03 MED ORDER — HYDROCODONE-ACETAMINOPHEN 5-325 MG PO TABS
ORAL_TABLET | ORAL | Status: AC
  Administered 2015-05-03: 2 via ORAL
  Filled 2015-05-03: qty 2

## 2015-05-03 MED ORDER — SUGAMMADEX SODIUM 200 MG/2ML IV SOLN
INTRAVENOUS | Status: AC
  Filled 2015-05-03: qty 2

## 2015-05-03 MED ORDER — DEXTRAN 40 IN SALINE 10-0.9 % IV SOLN
INTRAVENOUS | Status: AC
  Filled 2015-05-03: qty 500

## 2015-05-03 MED ORDER — ALUM & MAG HYDROXIDE-SIMETH 200-200-20 MG/5ML PO SUSP: 15.0000 mL | ORAL | Status: DC | PRN

## 2015-05-03 MED ORDER — HYDROCODONE-ACETAMINOPHEN 5-325 MG PO TABS
1.0000 | ORAL_TABLET | Freq: Four times a day (QID) | ORAL | Status: DC | PRN
  Administered 2015-05-03: 2 via ORAL
  Administered 2015-05-04 (×3): 1 via ORAL
  Filled 2015-05-03 (×3): qty 1

## 2015-05-03 MED ORDER — ASPIRIN EC 81 MG PO TBEC
81.0000 mg | DELAYED_RELEASE_TABLET | Freq: Every day | ORAL | Status: DC
  Administered 2015-05-04: 81 mg via ORAL
  Filled 2015-05-03: qty 1

## 2015-05-03 MED ORDER — 0.9 % SODIUM CHLORIDE (POUR BTL) OPTIME
TOPICAL | Status: DC | PRN
  Administered 2015-05-03: 2000 mL

## 2015-05-03 MED ORDER — LORATADINE 10 MG PO TABS
10.0000 mg | ORAL_TABLET | Freq: Every day | ORAL | Status: DC
  Administered 2015-05-04: 10 mg via ORAL
  Filled 2015-05-03: qty 1

## 2015-05-03 MED ORDER — GABAPENTIN 100 MG PO CAPS
100.0000 mg | ORAL_CAPSULE | Freq: Every day | ORAL | Status: DC
  Administered 2015-05-03: 100 mg via ORAL
  Filled 2015-05-03: qty 1

## 2015-05-03 MED ORDER — MEPERIDINE HCL 25 MG/ML IJ SOLN: 6.2500 mg | INTRAMUSCULAR | Status: DC | PRN

## 2015-05-03 MED ORDER — PHENYLEPHRINE HCL 10 MG/ML IJ SOLN
10.0000 mg | INTRAVENOUS | Status: DC | PRN
  Administered 2015-05-03: 50 ug/min via INTRAVENOUS

## 2015-05-03 MED ORDER — HEPARIN SODIUM (PORCINE) 1000 UNIT/ML IJ SOLN
INTRAMUSCULAR | Status: AC
  Filled 2015-05-03: qty 1

## 2015-05-03 MED ORDER — PNEUMOCOCCAL VAC POLYVALENT 25 MCG/0.5ML IJ INJ
0.5000 mL | INJECTION | INTRAMUSCULAR | Status: AC
  Administered 2015-05-04: 0.5 mL via INTRAMUSCULAR
  Filled 2015-05-03: qty 0.5

## 2015-05-03 MED ORDER — SODIUM CHLORIDE 0.9 % IV SOLN
0.0125 ug/kg/min | INTRAVENOUS | Status: DC
  Filled 2015-05-03: qty 2000

## 2015-05-03 MED ORDER — METOPROLOL TARTRATE 1 MG/ML IV SOLN: 2.0000 mg | INTRAVENOUS | Status: DC | PRN

## 2015-05-03 MED ORDER — ONDANSETRON HCL 4 MG/2ML IJ SOLN
INTRAMUSCULAR | Status: AC
  Filled 2015-05-03: qty 2

## 2015-05-03 MED ORDER — HEPARIN SODIUM (PORCINE) 1000 UNIT/ML IJ SOLN
INTRAMUSCULAR | Status: DC | PRN
  Administered 2015-05-03: 7000 [IU] via INTRAVENOUS
  Administered 2015-05-03: 2000 [IU] via INTRAVENOUS

## 2015-05-03 MED ORDER — SODIUM CHLORIDE 0.9 % IV SOLN
500.0000 mL | Freq: Once | INTRAVENOUS | Status: AC | PRN
  Administered 2015-05-03: 500 mL via INTRAVENOUS

## 2015-05-03 MED ORDER — FENTANYL CITRATE (PF) 250 MCG/5ML IJ SOLN
INTRAMUSCULAR | Status: AC
  Filled 2015-05-03: qty 5

## 2015-05-03 MED ORDER — ARTIFICIAL TEARS OP OINT
TOPICAL_OINTMENT | OPHTHALMIC | Status: AC
  Filled 2015-05-03: qty 3.5

## 2015-05-03 MED ORDER — FENTANYL CITRATE (PF) 250 MCG/5ML IJ SOLN
INTRAMUSCULAR | Status: DC | PRN
  Administered 2015-05-03 (×2): 50 ug via INTRAVENOUS
  Administered 2015-05-03: 25 ug via INTRAVENOUS
  Administered 2015-05-03: 50 ug via INTRAVENOUS
  Administered 2015-05-03: 75 ug via INTRAVENOUS

## 2015-05-03 MED ORDER — PHENYLEPHRINE 40 MCG/ML (10ML) SYRINGE FOR IV PUSH (FOR BLOOD PRESSURE SUPPORT)
PREFILLED_SYRINGE | INTRAVENOUS | Status: AC
  Filled 2015-05-03: qty 10

## 2015-05-03 MED ORDER — POTASSIUM CHLORIDE CRYS ER 20 MEQ PO TBCR: 20.0000 meq | EXTENDED_RELEASE_TABLET | Freq: Every day | ORAL | Status: DC | PRN

## 2015-05-03 MED ORDER — PROPOFOL 10 MG/ML IV BOLUS
INTRAVENOUS | Status: AC
  Filled 2015-05-03: qty 20

## 2015-05-03 MED ORDER — SODIUM CHLORIDE 0.9 % IV SOLN
INTRAVENOUS | Status: DC
  Administered 2015-05-03: 75 mL/h via INTRAVENOUS

## 2015-05-03 MED ORDER — CHLORHEXIDINE GLUCONATE 4 % EX LIQD: 60.0000 mL | Freq: Once | CUTANEOUS | Status: DC

## 2015-05-03 MED ORDER — HEMOSTATIC AGENTS (NO CHARGE) OPTIME
TOPICAL | Status: DC | PRN
  Administered 2015-05-03: 1 via TOPICAL

## 2015-05-03 MED ORDER — PHENOL 1.4 % MT LIQD: 1.0000 | OROMUCOSAL | Status: DC | PRN

## 2015-05-03 MED ORDER — ROCURONIUM BROMIDE 50 MG/5ML IV SOLN
INTRAVENOUS | Status: AC
  Filled 2015-05-03: qty 1

## 2015-05-03 MED ORDER — GLYCOPYRROLATE 0.2 MG/ML IJ SOLN
INTRAMUSCULAR | Status: AC
  Filled 2015-05-03: qty 1

## 2015-05-03 MED ORDER — PANTOPRAZOLE SODIUM 40 MG PO TBEC
40.0000 mg | DELAYED_RELEASE_TABLET | Freq: Every day | ORAL | Status: DC
  Administered 2015-05-04: 40 mg via ORAL
  Filled 2015-05-03: qty 1

## 2015-05-03 MED ORDER — HYDROMORPHONE HCL 1 MG/ML IJ SOLN
INTRAMUSCULAR | Status: AC
  Filled 2015-05-03: qty 1

## 2015-05-03 MED ORDER — EPHEDRINE SULFATE 50 MG/ML IJ SOLN
INTRAMUSCULAR | Status: AC
  Filled 2015-05-03: qty 1

## 2015-05-03 MED ORDER — BISACODYL 10 MG RE SUPP: 10.0000 mg | Freq: Every day | RECTAL | Status: DC | PRN

## 2015-05-03 MED ORDER — LIDOCAINE HCL (CARDIAC) 20 MG/ML IV SOLN
INTRAVENOUS | Status: AC
  Filled 2015-05-03: qty 5

## 2015-05-03 MED ORDER — SUGAMMADEX SODIUM 200 MG/2ML IV SOLN
INTRAVENOUS | Status: DC | PRN
  Administered 2015-05-03: 200 mg via INTRAVENOUS

## 2015-05-03 MED ORDER — ONDANSETRON HCL 4 MG/2ML IJ SOLN: 4.0000 mg | Freq: Four times a day (QID) | INTRAMUSCULAR | Status: DC | PRN

## 2015-05-03 MED ORDER — METOPROLOL TARTRATE 25 MG PO TABS
25.0000 mg | ORAL_TABLET | Freq: Two times a day (BID) | ORAL | Status: DC
  Administered 2015-05-03 – 2015-05-04 (×2): 25 mg via ORAL
  Filled 2015-05-03 (×2): qty 1

## 2015-05-03 MED ORDER — ACETAMINOPHEN 650 MG RE SUPP: 325.0000 mg | RECTAL | Status: DC | PRN

## 2015-05-03 MED ORDER — ADULT MULTIVITAMIN W/MINERALS CH
1.0000 | ORAL_TABLET | Freq: Every day | ORAL | Status: DC
  Administered 2015-05-04: 1 via ORAL
  Filled 2015-05-03: qty 1

## 2015-05-03 MED ORDER — DEXTRAN 40 IN SALINE 10-0.9 % IV SOLN
INTRAVENOUS | Status: DC | PRN
  Administered 2015-05-03: 500 mL

## 2015-05-03 MED ORDER — VALACYCLOVIR HCL 500 MG PO TABS
500.0000 mg | ORAL_TABLET | Freq: Every day | ORAL | Status: DC
  Administered 2015-05-04: 500 mg via ORAL
  Filled 2015-05-03: qty 1

## 2015-05-03 MED ORDER — LOSARTAN POTASSIUM 50 MG PO TABS
50.0000 mg | ORAL_TABLET | Freq: Every day | ORAL | Status: DC
  Administered 2015-05-04: 50 mg via ORAL
  Filled 2015-05-03: qty 1

## 2015-05-03 MED ORDER — PROTAMINE SULFATE 10 MG/ML IV SOLN
INTRAVENOUS | Status: DC | PRN
  Administered 2015-05-03: 50 mg via INTRAVENOUS

## 2015-05-03 MED ORDER — ONDANSETRON HCL 4 MG/2ML IJ SOLN
INTRAMUSCULAR | Status: DC | PRN
  Administered 2015-05-03: 4 mg via INTRAVENOUS

## 2015-05-03 MED ORDER — SERTRALINE HCL 100 MG PO TABS
100.0000 mg | ORAL_TABLET | Freq: Every day | ORAL | Status: DC
  Administered 2015-05-04: 100 mg via ORAL
  Filled 2015-05-03: qty 1

## 2015-05-03 MED ORDER — PROPOFOL 10 MG/ML IV BOLUS
INTRAVENOUS | Status: DC | PRN
  Administered 2015-05-03: 150 mg via INTRAVENOUS
  Administered 2015-05-03: 50 mg via INTRAVENOUS

## 2015-05-03 MED ORDER — KETAMINE HCL 10 MG/ML IJ SOLN
INTRAMUSCULAR | Status: DC | PRN
  Administered 2015-05-03: 50 mg via INTRAVENOUS

## 2015-05-03 MED ORDER — DEXTROSE 5 % IV SOLN
1.5000 g | Freq: Two times a day (BID) | INTRAVENOUS | Status: AC
  Administered 2015-05-03 – 2015-05-04 (×2): 1.5 g via INTRAVENOUS
  Filled 2015-05-03 (×2): qty 1.5

## 2015-05-03 MED ORDER — DOCUSATE SODIUM 100 MG PO CAPS
100.0000 mg | ORAL_CAPSULE | Freq: Every day | ORAL | Status: DC
Start: 2015-05-04 — End: 2015-05-04
  Administered 2015-05-04: 100 mg via ORAL
  Filled 2015-05-03: qty 1

## 2015-05-03 MED ORDER — MIDAZOLAM HCL 2 MG/2ML IJ SOLN
0.5000 mg | Freq: Once | INTRAMUSCULAR | Status: AC
  Administered 2015-05-03: 2 mg via INTRAVENOUS

## 2015-05-03 MED ORDER — SODIUM CHLORIDE 0.9 % IV SOLN
2000.0000 ug | INTRAVENOUS | Status: DC | PRN
  Administered 2015-05-03: .1 ug/kg/min via INTRAVENOUS

## 2015-05-03 MED ORDER — LIDOCAINE HCL (PF) 1 % IJ SOLN
INTRAMUSCULAR | Status: AC
  Filled 2015-05-03: qty 30

## 2015-05-03 MED ORDER — LABETALOL HCL 5 MG/ML IV SOLN: 10.0000 mg | INTRAVENOUS | Status: DC | PRN

## 2015-05-03 MED ORDER — ROCURONIUM BROMIDE 100 MG/10ML IV SOLN
INTRAVENOUS | Status: DC | PRN
  Administered 2015-05-03: 50 mg via INTRAVENOUS
  Administered 2015-05-03 (×2): 10 mg via INTRAVENOUS

## 2015-05-03 MED ORDER — MIDAZOLAM HCL 2 MG/2ML IJ SOLN
INTRAMUSCULAR | Status: AC
  Administered 2015-05-03: 2 mg via INTRAVENOUS
  Filled 2015-05-03: qty 2

## 2015-05-03 MED ORDER — MUPIROCIN 2 % EX OINT
1.0000 | TOPICAL_OINTMENT | Freq: Once | CUTANEOUS | Status: AC
Start: 2015-05-03 — End: 2015-05-03
  Administered 2015-05-03: 1 via TOPICAL
  Filled 2015-05-03: qty 22

## 2015-05-03 MED ORDER — ACETAMINOPHEN 325 MG PO TABS
325.0000 mg | ORAL_TABLET | ORAL | Status: DC | PRN
  Administered 2015-05-04: 650 mg via ORAL
  Filled 2015-05-03: qty 2

## 2015-05-03 MED ORDER — GLYCOPYRROLATE 0.2 MG/ML IJ SOLN
INTRAMUSCULAR | Status: DC | PRN
  Administered 2015-05-03: 0.2 mg via INTRAVENOUS

## 2015-05-03 MED ORDER — ATORVASTATIN CALCIUM 40 MG PO TABS
40.0000 mg | ORAL_TABLET | Freq: Every day | ORAL | Status: DC
  Administered 2015-05-03: 40 mg via ORAL
  Filled 2015-05-03: qty 1

## 2015-05-03 MED ORDER — SODIUM CHLORIDE 0.9 % IV SOLN
INTRAVENOUS | Status: DC | PRN
  Administered 2015-05-03: 500 mL

## 2015-05-03 MED ORDER — PHENYLEPHRINE HCL 10 MG/ML IJ SOLN
INTRAMUSCULAR | Status: DC | PRN
  Administered 2015-05-03: 160 ug via INTRAVENOUS
  Administered 2015-05-03 (×2): 120 ug via INTRAVENOUS

## 2015-05-03 MED ORDER — HYDROMORPHONE HCL 1 MG/ML IJ SOLN
0.2500 mg | INTRAMUSCULAR | Status: DC | PRN
  Administered 2015-05-03 (×3): 0.5 mg via INTRAVENOUS

## 2015-05-03 MED ORDER — ALPRAZOLAM 0.25 MG PO TABS: 0.2500 mg | ORAL_TABLET | Freq: Every day | ORAL | Status: DC | PRN

## 2015-05-03 MED ORDER — THROMBIN 20000 UNITS EX SOLR
CUTANEOUS | Status: AC
  Filled 2015-05-03: qty 20000

## 2015-05-03 MED ORDER — EPHEDRINE SULFATE 50 MG/ML IJ SOLN
INTRAMUSCULAR | Status: DC | PRN
  Administered 2015-05-03 (×2): 5 mg via INTRAVENOUS

## 2015-05-03 MED ORDER — KETAMINE HCL 100 MG/ML IJ SOLN
INTRAMUSCULAR | Status: AC
  Filled 2015-05-03: qty 1

## 2015-05-03 SURGICAL SUPPLY — 52 items
ADPR TBG 2 MALE LL ART (MISCELLANEOUS)
BAG DECANTER FOR FLEXI CONT (MISCELLANEOUS) ×2 IMPLANT
CANISTER SUCTION 2500CC (MISCELLANEOUS) ×2 IMPLANT
CATH ROBINSON RED A/P 18FR (CATHETERS) ×2 IMPLANT
CATH SUCT 10FR WHISTLE TIP (CATHETERS) IMPLANT
CLIP TI MEDIUM 6 (CLIP) ×2 IMPLANT
CLIP TI WIDE RED SMALL 6 (CLIP) ×4 IMPLANT
COVER PROBE W GEL 5X96 (DRAPES) ×2 IMPLANT
CRADLE DONUT ADULT HEAD (MISCELLANEOUS) ×2 IMPLANT
ELECT REM PT RETURN 9FT ADLT (ELECTROSURGICAL) ×2
ELECTRODE REM PT RTRN 9FT ADLT (ELECTROSURGICAL) ×1 IMPLANT
GAUZE SPONGE 4X4 12PLY STRL (GAUZE/BANDAGES/DRESSINGS) ×4 IMPLANT
GLOVE BIO SURGEON STRL SZ 6.5 (GLOVE) ×4 IMPLANT
GLOVE BIO SURGEON STRL SZ7 (GLOVE) ×2 IMPLANT
GLOVE BIOGEL PI IND STRL 6.5 (GLOVE) ×1 IMPLANT
GLOVE BIOGEL PI IND STRL 7.5 (GLOVE) ×1 IMPLANT
GLOVE BIOGEL PI INDICATOR 6.5 (GLOVE) ×1
GLOVE BIOGEL PI INDICATOR 7.5 (GLOVE) ×1
GLOVE ECLIPSE 7.5 STRL STRAW (GLOVE) ×2 IMPLANT
GOWN STRL REUS W/ TWL LRG LVL3 (GOWN DISPOSABLE) ×2 IMPLANT
GOWN STRL REUS W/TWL LRG LVL3 (GOWN DISPOSABLE) ×4
GOWN STRL REUS W/TWL XL LVL3 (GOWN DISPOSABLE) ×2 IMPLANT
HEMOSTAT SPONGE AVITENE ULTRA (HEMOSTASIS) ×2 IMPLANT
IV ADAPTER SYR DOUBLE MALE LL (MISCELLANEOUS) IMPLANT
KIT BASIN OR (CUSTOM PROCEDURE TRAY) ×2 IMPLANT
KIT ROOM TURNOVER OR (KITS) ×2 IMPLANT
KIT SHUNT ARGYLE CAROTID ART 6 (VASCULAR PRODUCTS) ×2 IMPLANT
LIQUID BAND (GAUZE/BANDAGES/DRESSINGS) ×2 IMPLANT
NS IRRIG 1000ML POUR BTL (IV SOLUTION) ×6 IMPLANT
PACK CAROTID (CUSTOM PROCEDURE TRAY) ×2 IMPLANT
PAD ARMBOARD 7.5X6 YLW CONV (MISCELLANEOUS) ×4 IMPLANT
PATCH VASC XENOSURE 1CMX6CM (Vascular Products) ×2 IMPLANT
PATCH VASC XENOSURE 1X6 (Vascular Products) ×1 IMPLANT
SET COLLECT BLD 21X3/4 12 PB (MISCELLANEOUS) IMPLANT
SHUNT CAROTID BYPASS 10 (VASCULAR PRODUCTS) IMPLANT
SHUNT CAROTID BYPASS 12FRX15.5 (VASCULAR PRODUCTS) IMPLANT
SPONGE INTESTINAL PEANUT (DISPOSABLE) ×2 IMPLANT
STOPCOCK 4 WAY LG BORE MALE ST (IV SETS) IMPLANT
SUT ETHILON 3 0 PS 1 (SUTURE) IMPLANT
SUT MNCRL AB 4-0 PS2 18 (SUTURE) ×2 IMPLANT
SUT PROLENE 6 0 BV (SUTURE) ×4 IMPLANT
SUT PROLENE 7 0 BV 1 (SUTURE) ×6 IMPLANT
SUT PROLENE 7 0 BV1 MDA (SUTURE) ×2 IMPLANT
SUT SILK 3 0 TIES 17X18 (SUTURE)
SUT SILK 3-0 18XBRD TIE BLK (SUTURE) IMPLANT
SUT VIC AB 3-0 SH 27 (SUTURE) ×1
SUT VIC AB 3-0 SH 27X BRD (SUTURE) ×1 IMPLANT
SYR TB 1ML LUER SLIP (SYRINGE) IMPLANT
SYSTEM CHEST DRAIN TLS 7FR (DRAIN) IMPLANT
TUBING ART PRESS 48 MALE/FEM (TUBING) IMPLANT
TUBING EXTENTION W/L.L. (IV SETS) IMPLANT
WATER STERILE IRR 1000ML POUR (IV SOLUTION) ×2 IMPLANT

## 2015-05-03 NOTE — Anesthesia Procedure Notes (Signed)
Procedure Name: Intubation Date/Time: 05/03/2015 8:37 AM Performed by: Salomon MastWALL, Pama Roskos COREY Pre-anesthesia Checklist: Patient identified, Emergency Drugs available, Suction available and Patient being monitored Patient Re-evaluated:Patient Re-evaluated prior to inductionOxygen Delivery Method: Circle system utilized Preoxygenation: Pre-oxygenation with 100% oxygen Intubation Type: IV induction Ventilation: Mask ventilation without difficulty Laryngoscope Size: Mac, 3 and Glidescope (T3) Grade View: Grade III Tube type: Oral Tube size: 7.0 mm Number of attempts: 2 (Extremely anterior) Airway Equipment and Method: Stylet and Video-laryngoscopy Placement Confirmation: ETT inserted through vocal cords under direct vision,  positive ETCO2 and breath sounds checked- equal and bilateral Secured at: 23 cm Tube secured with: Tape Dental Injury: Teeth and Oropharynx as per pre-operative assessment  Difficulty Due To: Difficulty was unanticipated

## 2015-05-03 NOTE — Op Note (Signed)
OPERATIVE NOTE  PROCEDURE:   1.  right carotid endarterectomy with bovine patch angioplasty 2.  right intraoperative carotid ultrasound  PRE-OPERATIVE DIAGNOSIS: right asymptomatic carotid stenosis >80%  POST-OPERATIVE DIAGNOSIS: same as above   SURGEON: Leonides Sake, MD  ASSISTANT(S): Doreatha Massed, PAC   ANESTHESIA: general  ESTIMATED BLOOD LOSS: 50 cc  FINDING(S): 1.  Continuous Doppler audible flow signatures are appropriate for each carotid artery. 2.  No evidence of intimal flap visualized on transverse or longitudinal ultrasonography. 3.  >80% smooth stenosis in proximal internal carotid artery   SPECIMEN(S):  None   INDICATIONS:   Heather [name] is a 70 y.o. female who presents with right asymptomatic carotid stenosis >80%.  I discussed with the patient the risks, benefits, and alternatives to carotid endarterectomy.  I discussed the procedural details of carotid endarterectomy with the patient.  The patient is aware that the risks of carotid endarterectomy include but are not limited to: bleeding, infection, stroke, myocardial infarction, death, cranial nerve injuries both temporary and permanent, neck hematoma, possible airway compromise, labile blood pressure post-operatively, cerebral hyperperfusion syndrome, and possible need for additional interventions in the future. The patient is aware of the risks and agrees to proceed forward with the procedure.  DESCRIPTION:  After full informed written consent was obtained from the patient, the patient was brought back to the operating room and placed supine upon the operating table.  Prior to induction, the patient received IV antibiotics.  After obtaining adequate anesthesia, the patient was placed into semi-Fowler position with a shoulder roll in place and the patient's neck slightly hyperextended and rotated away from the surgical site.  The patient was prepped in the standard fashion for a right carotid endarterectomy.  I  made an incision anterior to the sternocleidomastoid muscle and dissected down through the subcutaneous tissue.  The platysmas was opened with electrocautery.  Then I dissected down to the internal jugular vein.  This was dissected posteriorly until I obtained visualization of the common carotid artery.  This was dissected out and then an umbilical tape was placed around the common carotid artery and I loosely applied a Rumel tourniquet.  I then dissected in a periadventitial fashion along the common carotid artery up to the bifurcation.  I then identified the external carotid artery and the superior thyroid artery.  A 2-0 silk tie was looped around the superior thyroid artery, and I also dissected out the external carotid artery and placed a vessel loop around it.  In continuing the dissection to the internal carotid artery, I identified the facial vein.  This was ligated and then transected, giving me improved exposure of the internal carotid artery.  In the process of this dissection, the hypoglossal nerve was identified.  I then dissected out the internal carotid artery until I identified an area of soft tissue in the internal carotid artery.  I dissected slightly distal to this area, and placed an umbilical tape around the artery and loosely applied a Rumel tourniquet.  At this point, we gave the patient a therapeutic bolus of Heparin intravenously, 7000 units.  After waiting 3 minutes, then I clamped the internal carotid artery, external carotid artery and then the common carotid artery.  I then made an arteriotomy in the common carotid artery with a 11 blade, and extended the arteriotomy with a Potts scissor down into the common carotid artery, then I carried the arteriotomy through the bifurcation into the internal carotid artery until I reached an area that  was not diseased.  At this point, I took the 10 shunt that previously been prepared and I inserted it into the internal carotid artery.  The Rumel  tourniquet was then applied to this end of the shunt.  I unclamped the shunt to verify retrograde blood flow in the internal carotid artery.  I then placed the other end of the shunt into the common carotid artery after unclamping the artery.  The Rumel was tightened down around the shunt.  At this point, I verified blood flow in the shunt with a continuous doppler.  At this point, I started the endarterectomy in the common carotid artery with a Cytogeneticistenfield dissector and carried this dissection down into the common carotid artery circumferentially.  Then I transected the plaque at a segment where it was adherent.  I then carried this dissection up into the external carotid artery.  The plaque was extracted by unclamping the external carotid artery and everting the artery.  In this process, the wall of the external carotid artery became partially disrupted, so I repaired this by placing 7-0 prolene tacking sutures.  The dissection was then carried into the internal carotid artery, extracting the remaining portion of the carotid plaque.  I passed the plaque off the field as a specimen.  I then spent the next 30 minutes removing intimal flaps and loose debris.  Eventually I reached the point where the residual plaque was densely adherent and any further dissection would compromise the integrity of the wall.  After verifying that there was no more loose intimal flaps or debris, I re-interrogated the entirety of this carotid artery.  At this point, I was satisfied that the minimal remaining disease was densely adherent to the wall and wall integrity was intact.  At this point, I then fashioned a bovine pericardial patch for the geometry of this artery and sewed it in place with two running stitch of 6-0 Prolene, one from each end.  Prior to completing this patch angioplasty, I removed the shunt first from the internal carotid artery, from which there was excellent backbleeding, and clamped it.  Then I removed the shunt from  the common carotid artery, from which there was excellent antegrade bleeding, and then clamped it.  At this point, I allowed the external carotid artery to backbleed, which was excellent.  Then I instilled heparinized saline in this patched artery and then completed the patch angioplasty in the usual fashion.  First, I released the clamp on the external carotid artery, then I released it on the common carotid artery.  After waiting a few seconds, I then released it on the internal carotid artery.  I then interrogated this patient's arteries with the continuous Doppler.  The audible waveforms in each artery were consistent with the expected characteristics for each artery.  The Sonosite probe was then sterilely draped and used to interrogate the carotid artery in both longitudinal and transverse views.  At this point, I washed out the wound, and placed thrombin and Gelfoam throughout.  I also gave the patient 50 mg of protamine to reverse his anticoagulation.   After waiting a few minutes, I removed the thrombin and Gelfoam and washed out the wound.  There was no more active bleeding in the surgical site.   I then reapproximated the platysma muscle with a running stitch of 3-0 Vicryl.  The skin was then reapproximated with a running subcuticular 4-0 Monocryl stitch.  The skin was then cleaned, dried and Dermabond was used to reinforce  the skin closure.  The patient woke without any problems, neurologically intact.      COMPLICATIONS: none  CONDITION: stable   Leonides Sake, MD [facility] Office: [phone] Pager: [phone]  05/03/2015, 11:34 AM

## 2015-05-03 NOTE — Progress Notes (Signed)
          Ipsilateral facial droop causing asymmetric smile on exam No tongue deviation Right neck incision soft without hematoma  S/P right CEA marginal mandibular branch of the facial nerve temporary injury from retraction   Cina Klumpp MAUREEN PA-C

## 2015-05-03 NOTE — H&P (View-Only) (Signed)
Established Carotid Patient  History of Present Illness  Heather [name] is a 70 y.o. (September 04, 1945) female who presents with chief complaint: return from cardiology.  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  I had the R carotid repeated: 80-99%.  Patient has no history of TIA or stroke symptom.  The patient has never had amaurosis fugax or monocular blindness.  The patient has never had facial drooping or hemiplegia.  The patient has never had receptive or expressive aphasia.    Her stress test was found to be: low risk.   Past Medical History  Diagnosis Date  . MVP (mitral valve prolapse)     echo 1986-mild  . Hypertension   . Depression   . Anxiety   . 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 Wears glasses   . Heart murmur   . HOH (hard of hearing)     left  . Chicken pox   . Shortness of breath dyspnea     WITH EXERTION      Past Surgical History  Procedure Laterality Date  . Tubal ligation  1995  . Tibia fracture surgery  1975    rt  . Nasal sinus surgery  1975  . Plantar fascia release  1968    both feet  . Tonsillectomy  1951  . Carpal tunnel release Right 03/31/2013    Procedure: RIGHT CARPAL TUNNEL RELEASE;  Surgeon: Nicki Reaper, MD;  Location: [facility];  Service: Orthopedics;  Laterality: Right;  . Rotator cuff repair Right 1998    Social History   Social History  . Marital Status: Divorced    Spouse Name: N/A  . Number of Children: N/A  . Years of Education: N/A   Occupational History  . Not on file.   Social History Main Topics  . Smoking status: Never Smoker   . Smokeless tobacco: Never Used  . Alcohol Use: 3.6 oz/week    6 Cans of beer per week     Comment: occasional  . Drug Use: No  . Sexual Activity: Yes   Other Topics Concern  . Not on file   Social History Narrative    Family History    Problem Relation Age of Onset  . Osteoporosis Mother   . Heart disease Mother   . Hypertension Mother   . Hyperlipidemia Mother   . Stroke Mother   . Arthritis Mother   . Diabetes Father   . Heart attack Father   . Colon cancer Neg Hx   . Cancer Sister     breast    Current Outpatient Prescriptions  Medication Sig Dispense Refill  . ALPRAZolam (XANAX) 0.25 MG tablet TAKE 1 TABLET BY MOUTH DAILY AS NEEDED 30 tablet 0  . Ascorbic Acid (VITAMIN C) 1000 MG tablet Take 1,000 mg by mouth daily.    Marland Kitchen aspirin EC 81 MG tablet Take 1 tablet (81 mg total) by mouth daily.    Marland Kitchen atorvastatin (LIPITOR) 40 MG tablet Take 1 tablet (40 mg total) by mouth daily. 30 tablet 11  . Calcium Carbonate-Vitamin D (CALTRATE 600+D) 600-400 MG-UNIT per tablet Take 1 tablet by mouth daily.      . Cholecalciferol (VITAMIN D3) 1000 UNITS CAPS Take by mouth.    . gabapentin (NEURONTIN) 100 MG capsule Take 100 mg by mouth at bedtime.     Marland Kitchen HYDROcodone-acetaminophen (NORCO/VICODIN) 5-325 MG  tablet Take 1-2 tablets by mouth every 6 (six) hours as needed.     . lidocaine (XYLOCAINE) 2 % jelly Apply 1 application topically as needed.     . loratadine (CLARITIN) 10 MG tablet Take 10 mg by mouth daily.      Marland Kitchen. losartan (COZAAR) 50 MG tablet Take 1 tablet (50 mg total) by mouth daily. 30 tablet 11  . meloxicam (MOBIC) 15 MG tablet Take 15 mg by mouth daily.     . metoprolol (LOPRESSOR) 25 MG tablet Take 1 tablet (25 mg total) by mouth 2 (two) times daily. 60 tablet 11  . Multiple Vitamin (MULTIVITAMIN) tablet Take 1 tablet by mouth daily.      . sertraline (ZOLOFT) 50 MG tablet TAKE 2 TABLETS BY MOUTH EVERY DAY 180 tablet 2  . traMADol (ULTRAM) 50 MG tablet Take 50-100 mg by mouth every 6 (six) hours as needed. Reported on 02/21/2015    . valACYclovir (VALTREX) 500 MG tablet TAKE 1 TABLET BY MOUTH DAILY CAN IN CREASE TO 2 FOR 5 DAYS IN THE EVENT OF RECURRENCE (Patient taking differently: TAKE 1 TABLET BY MOUTH DAILY CAN IN  CREASE TO 2 FOR 5 DAYS IN THE EVENT OF RECURRENCE AS NEEDED) 30 tablet 12   No current facility-administered medications for this visit.     Allergies  Allergen Reactions  . Sulfamethoxazole Swelling     REVIEW OF SYSTEMS:  (Positives checked otherwise negative)  CARDIOVASCULAR:   [ ]  chest pain,  [ ]  chest pressure,  [ ]  palpitations,  [ ]  shortness of breath when laying flat,  [ ]  shortness of breath with exertion,   [ ]  pain in feet when walking,  [ ]  pain in feet when laying flat, [ ]  history of blood clot in veins (DVT),  [ ]  history of phlebitis,  [ ]  swelling in legs,  [ ]  varicose veins  PULMONARY:   [ ]  productive cough,  [ ]  asthma,  [ ]  wheezing  NEUROLOGIC:   [ ]  weakness in arms or legs,  [ ]  numbness in arms or legs,  [ ]  difficulty speaking or slurred speech,  [ ]  temporary loss of vision in one eye,  [ ]  dizziness  HEMATOLOGIC:   [ ]  bleeding problems,  [ ]  problems with blood clotting too easily  MUSCULOSKEL:   [ ]  joint pain, [ ]  joint swelling  GASTROINTEST:   [ ]  vomiting blood,  [ ]  blood in stool     GENITOURINARY:   [ ]  burning with urination,  [ ]  blood in urine  PSYCHIATRIC:   [ ]  history of major depression  INTEGUMENTARY:   [ ]  rashes,  [ ]  ulcers  CONSTITUTIONAL:   [ ]  fever,  [ ]  chills   Physical Examination  Filed Vitals:   04/27/15 1525 04/27/15 1526  BP: 125/56 132/57  Pulse: 69 66  Temp: 98.7 F (37.1 C)   Resp: 14   Height: 5\' 5"  (1.651 m)   Weight: 155 lb (70.308 kg)   SpO2: 97%    Body mass index is 25.79 kg/(m^2).  General: A&O x 3, WDWN  Eyes: PERRLA, EOMI  Neck: Supple, no nuchal rigidity, no palpable LAD  Pulmonary: Sym exp, good air movt, CTAB, no rales, rhonchi, & wheezing  Cardiac: RRR, Nl S1, S2, no Murmurs, rubs or gallops  Vascular: Vessel Right Left  Radial Palpable Palpable  Brachial Palpable Palpable  Carotid Palpable, without bruit Palpable, without bruit  Aorta  Not  palpable N/A  Femoral Palpable Palpable  Popliteal Not palpable Not palpable  PT Palpable Palpable  DP Palpable Palpable   Gastrointestinal: soft, NTND, no G/R, no HSM, no masses, no CVAT B  Musculoskeletal: M/S 5/5 throughout , Extremities without ischemic changes   Neurologic: CN 2-12 intact , Pain and light touch intact in extremities , Motor exam as listed above   Medical Decision Making  Heather [name] is a 70 y.o. female who presents with: R asx ICA stenosis >80%., L asx ICA <80%   Based on the patient's vascular studies and examination, I have offered the patient: R CEA. I discussed with the patient the risks, benefits, and alternatives to carotid endarterectomy.   The patient is aware that the risks of carotid endarterectomy include but are not limited to: bleeding, infection, stroke, myocardial infarction, death, cranial nerve injuries both temporary and permanent, neck hematoma, possible airway compromise, labile blood pressure post-operatively, cerebral hyperperfusion syndrome, and possible need for additional interventions in the future.   The patient is aware of the risks and agrees to proceed forward with the procedure.  She is scheduled for the 5 APR 17.  I discussed in depth with the patient the nature of atherosclerosis, and emphasized the importance of maximal medical management including strict control of blood pressure, blood glucose, and lipid levels, antiplatelet agents, obtaining regular exercise, and cessation of smoking.    The patient is aware that without maximal medical management the underlying atherosclerotic disease process will progress, limiting the benefit of any interventions. The patient is currently on a statin: Lipitor. The patient is currently on an anti-platelet: ASA.  Thank you for allowing [place] to participate in this patient's care.   Leonides Sake, MD [facility] [place] Office: [phone] Pager:  [phone]  04/27/2015, 4:27 PM

## 2015-05-03 NOTE — Anesthesia Postprocedure Evaluation (Signed)
Anesthesia Post Note  Patient: Heather [name]  Procedure(s) Performed: Procedure(s) (LRB): RIGHT CAROTID ENDARTERECTOMY  (Right)  Patient location during evaluation: PACU Anesthesia Type: General Level of consciousness: sedated and patient cooperative Pain management: pain level controlled Vital Signs Assessment: post-procedure vital signs reviewed and stable Respiratory status: spontaneous breathing Cardiovascular status: stable Anesthetic complications: no    Last Vitals:  Filed Vitals:   05/03/15 1545 05/03/15 1600  BP: 99/84 96/38  Pulse: 57 57  Temp: 36.7 C   Resp: 13 13    Last Pain:  Filed Vitals:   05/03/15 1620  PainSc: 5                  Lewie LoronJohn Sherene Plancarte

## 2015-05-03 NOTE — Interval H&P Note (Signed)
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 After consideration of risks, benefits and other options for treatment, the patient has consented to  Procedure(s): ENDARTERECTOMY CAROTID (Right) as a surgical intervention .  The patient's history has been reviewed, patient examined, no change in status, stable for surgery.  I have reviewed the patient's chart and labs.  Questions were answered to the patient's satisfaction.     Leonides Sakehen, Brian

## 2015-05-03 NOTE — Transfer of Care (Signed)
Immediate Anesthesia Transfer of Care Note  Patient: Heather [name]  Procedure(s) Performed: Procedure(s): RIGHT CAROTID ENDARTERECTOMY  (Right)  Patient Location: PACU  Anesthesia Type:General  Level of Consciousness: awake, alert , oriented and patient cooperative  Airway & Oxygen Therapy: Patient Spontanous Breathing and Patient connected to nasal cannula oxygen  Post-op Assessment: Report given to RN, Post -op Vital signs reviewed and stable, Patient moving all extremities X 4 and Patient able to stick tongue midline  Post vital signs: Reviewed and stable  Last Vitals:  Filed Vitals:   05/03/15 0717  BP: 134/40  Pulse: 54  Temp: 36.4 C  Resp: 18    Complications: No apparent anesthesia complications

## 2015-05-04 ENCOUNTER — Encounter (HOSPITAL_COMMUNITY): Payer: Self-pay | Admitting: Vascular Surgery

## 2015-05-04 DIAGNOSIS — I6523 Occlusion and stenosis of bilateral carotid arteries: Secondary | ICD-10-CM | POA: Diagnosis not present

## 2015-05-04 LAB — BASIC METABOLIC PANEL
ANION GAP: 7 (ref 5–15)
BUN: 11 mg/dL (ref 6–20)
CO2: 24 mmol/L (ref 22–32)
Calcium: 8.9 mg/dL (ref 8.9–10.3)
Chloride: 106 mmol/L (ref 101–111)
Creatinine, Ser: 0.74 mg/dL (ref 0.44–1.00)
GFR calc Af Amer: >60 mL/min (ref >60)
GLUCOSE: 109 mg/dL — AB (ref 65–99)
POTASSIUM: 4.2 mmol/L (ref 3.5–5.1)
Sodium: 137 mmol/L (ref 135–145)

## 2015-05-04 LAB — CBC
HEMATOCRIT: 28.8 % — AB (ref 36.0–46.0)
HEMOGLOBIN: 9.8 g/dL — AB (ref 12.0–15.0)
MCH: 31.8 pg (ref 26.0–34.0)
MCHC: 34 g/dL (ref 30.0–36.0)
MCV: 93.5 fL (ref 78.0–100.0)
Platelets: 195 10*3/uL (ref 150–400)
RBC: 3.08 MIL/uL — AB (ref 3.87–5.11)
RDW: 13.1 % (ref 11.5–15.5)
WBC: 8.4 10*3/uL (ref 4.0–10.5)

## 2015-05-04 MED ORDER — OXYCODONE HCL 5 MG PO TABS
5.0000 mg | ORAL_TABLET | Freq: Four times a day (QID) | ORAL | Status: DC | PRN
Start: 2015-05-04 — End: 2015-07-06

## 2015-05-04 NOTE — Progress Notes (Addendum)
  Progress Note    05/04/2015 7:18 AM 1 Day Post-Op  Subjective:  C/o soreness; no trouble swallowing-only throat soreness  Tm 99.4 HR 50's-70's NSR (50's yesterday afternoon-70's since MN) 100's-120's systolic 94% RA  Filed Vitals:   05/04/15 0021 05/04/15 0535  BP: 122/46 109/38  Pulse: 72 67  Temp: 99.7 F (37.6 C) 99.4 F (37.4 C)  Resp: 15 18     Physical Exam: Neuro:  In tact; mild asymmetry with smile Lungs:  Non labored Incision:  C/d/i   CBC    Component Value Date/Time   WBC 8.4 05/04/2015 0450   RBC 3.08* 05/04/2015 0450   HGB 9.8* 05/04/2015 0450   HCT 28.8* 05/04/2015 0450   PLT 195 05/04/2015 0450   MCV 93.5 05/04/2015 0450   MCH 31.8 05/04/2015 0450   MCHC 34.0 05/04/2015 0450   RDW 13.1 05/04/2015 0450   LYMPHSABS 2.0 08/16/2014 1430   MONOABS 0.7 08/16/2014 1430   EOSABS 0.5 08/16/2014 1430   BASOSABS 0.0 08/16/2014 1430    BMET    Component Value Date/Time   NA 137 05/04/2015 0450   K 4.2 05/04/2015 0450   CL 106 05/04/2015 0450   CO2 24 05/04/2015 0450   GLUCOSE 109* 05/04/2015 0450   BUN 11 05/04/2015 0450   CREATININE 0.74 05/04/2015 0450   CREATININE 0.95* 03/29/2015 0925   CALCIUM 8.9 05/04/2015 0450   GFRNONAA >60 05/04/2015 0450   GFRAA >60 05/04/2015 0450     Intake/Output Summary (Last 24 hours) at 05/04/15 0718 Last data filed at 05/04/15 [id number]  Gross per 24 hour  Intake 3307.5 ml  Output   1700 ml  Net 1607.5 ml     Assessment/Plan:  This is a 70 y.o. female who is s/p right CEA 1 Day Post-Op  -pt is doing well this am.  Mild smile asymmetry; marginal mandibular palsy-should only be temporary - -pt neuro exam is in tact -swallowing okay-just sore throat -pt has not ambulated -pt has voided -f/u with Dr. Imogene Burnhen in 2 weeks. -most likely discharge later this morning.   Doreatha MassedSamantha Rhyne, PA-C [facility] [phone]  Addendum  I have independently interviewed and examined the patient, and I  agree with the physician assistant's findings.  Neuro intact, minimal right angle of mouth droop, symmetric smile, motor 5/5 sym, neck soft without hematoma  - marginal mandibular palsy nearly resolved - follow up in 2 weeks for wound check  Leonides SakeBrian Laquida Cotrell, MD [facility] [place] Office: [phone] Pager: [phone]  05/04/2015, 7:43 AM

## 2015-05-04 NOTE — Progress Notes (Signed)
Discharged instructions given to patient and husband. All questions answered .Patient discharged home with husband, belonging, handouts and prescriptions.

## 2015-05-04 NOTE — Care Management Note (Signed)
Case Management Note  Patient Details  Name: Heather [name] MRN: [id number] Date of Birth: 23-Sep-1945  Subjective/Objective:    Patient is for dc today, s/p CEA, no needs.                Action/Plan:   Expected Discharge Date:                  Expected Discharge Plan:  Home/Self Care  In-House Referral:     Discharge planning Services  CM Consult  Post Acute Care Choice:    Choice offered to:     DME Arranged:    DME Agency:     HH Arranged:    HH Agency:     Status of Service:  Completed, signed off  Medicare Important Message Given:    Date Medicare IM Given:    Medicare IM give by:    Date Additional Medicare IM Given:    Additional Medicare Important Message give by:     If discussed at Long Length of Stay Meetings, dates discussed:    Additional Comments:  Leone Havenaylor, Lilliane Sposito [place], RN 05/04/2015, 11:14 AM

## 2015-05-04 NOTE — Progress Notes (Signed)
Discharge instructions given to patient all questions answered at this time.  Prescription given to patient.  Pt. VSS with no s/s of distress noted.  Patient stable at discharge.   

## 2015-05-06 ENCOUNTER — Emergency Department (HOSPITAL_COMMUNITY)
Admission: EM | Admit: 2015-05-06 | Discharge: 2015-05-06 | Disposition: A | Discharge status: Home / Self Care | Payer: Medicare Other | Attending: Emergency Medicine | Admitting: Emergency Medicine

## 2015-05-06 ENCOUNTER — Encounter (HOSPITAL_COMMUNITY): Payer: Self-pay

## 2015-05-06 ENCOUNTER — Emergency Department (HOSPITAL_COMMUNITY): Payer: Medicare Other

## 2015-05-06 DIAGNOSIS — J019 Acute sinusitis, unspecified: Secondary | ICD-10-CM

## 2015-05-06 DIAGNOSIS — R51 Headache: Secondary | ICD-10-CM

## 2015-05-06 DIAGNOSIS — R011 Cardiac murmur, unspecified: Secondary | ICD-10-CM | POA: Insufficient documentation

## 2015-05-06 DIAGNOSIS — Z791 Long term (current) use of non-steroidal anti-inflammatories (NSAID): Secondary | ICD-10-CM | POA: Insufficient documentation

## 2015-05-06 DIAGNOSIS — F419 Anxiety disorder, unspecified: Secondary | ICD-10-CM | POA: Insufficient documentation

## 2015-05-06 DIAGNOSIS — B9689 Other specified bacterial agents as the cause of diseases classified elsewhere: Secondary | ICD-10-CM

## 2015-05-06 DIAGNOSIS — Z79899 Other long term (current) drug therapy: Secondary | ICD-10-CM | POA: Insufficient documentation

## 2015-05-06 DIAGNOSIS — J018 Other acute sinusitis: Secondary | ICD-10-CM | POA: Insufficient documentation

## 2015-05-06 DIAGNOSIS — F329 Major depressive disorder, single episode, unspecified: Secondary | ICD-10-CM | POA: Diagnosis not present

## 2015-05-06 DIAGNOSIS — H9192 Unspecified hearing loss, left ear: Secondary | ICD-10-CM | POA: Diagnosis not present

## 2015-05-06 DIAGNOSIS — I10 Essential (primary) hypertension: Secondary | ICD-10-CM | POA: Diagnosis not present

## 2015-05-06 DIAGNOSIS — Z8619 Personal history of other infectious and parasitic diseases: Secondary | ICD-10-CM | POA: Diagnosis not present

## 2015-05-06 DIAGNOSIS — R519 Headache, unspecified: Secondary | ICD-10-CM

## 2015-05-06 MED ORDER — HYDROCODONE-ACETAMINOPHEN 5-325 MG PO TABS
1.0000 | ORAL_TABLET | Freq: Once | ORAL | Status: AC
Start: 2015-05-06 — End: 2015-05-06
  Administered 2015-05-06: 1 via ORAL
  Filled 2015-05-06: qty 1

## 2015-05-06 MED ORDER — AMOXICILLIN 500 MG PO CAPS: 500.0000 mg | ORAL_CAPSULE | Freq: Three times a day (TID) | ORAL | Status: DC

## 2015-05-06 MED ORDER — IOPAMIDOL (ISOVUE-370) INJECTION 76%
INTRAVENOUS | Status: AC
  Filled 2015-05-06: qty 50

## 2015-05-06 MED ORDER — IOPAMIDOL (ISOVUE-370) INJECTION 76%
INTRAVENOUS | Status: AC
  Administered 2015-05-06: 50 mL
  Filled 2015-05-06: qty 50

## 2015-05-06 MED ORDER — AMOXICILLIN 500 MG PO CAPS
500.0000 mg | ORAL_CAPSULE | Freq: Once | ORAL | Status: AC
  Administered 2015-05-06: 500 mg via ORAL
  Filled 2015-05-06: qty 1

## 2015-05-06 MED ORDER — HYDROMORPHONE HCL 1 MG/ML IJ SOLN
1.0000 mg | Freq: Once | INTRAMUSCULAR | Status: AC
  Administered 2015-05-06: 1 mg via INTRAVENOUS
  Filled 2015-05-06: qty 1

## 2015-05-06 MED ORDER — HYDROCODONE-ACETAMINOPHEN 5-325 MG PO TABS: 1.0000 | ORAL_TABLET | Freq: Four times a day (QID) | ORAL | Status: DC | PRN

## 2015-05-06 NOTE — ED Provider Notes (Signed)
CSN: [id number]     Arrival date & time 05/06/15  1907 History   First MD Initiated Contact with Patient 05/06/15 1925     Chief Complaint  Patient presents with  . Headache     (Consider location/radiation/quality/duration/timing/severity/associated sxs/prior Treatment) Patient is a 70 y.o. female presenting with headaches. The history is provided by the patient (Patient complains of right-sided headache after she urinated today. She had a right carotid endarterectomy done on Wednesday.).  Headache Pain location:  R temporal Quality:  Dull Radiates to:  Does not radiate Severity currently:  7/10 Severity at highest:  8/10 Onset quality:  Sudden Timing:  Constant Progression:  Waxing and waning Chronicity:  New Context: not activity   Associated symptoms: no abdominal pain, no back pain, no congestion, no cough, no diarrhea, no fatigue, no seizures and no sinus pressure     Past Medical History  Diagnosis Date  . MVP (mitral valve prolapse)     echo 1986-mild  . Hypertension   . Depression   . Anxiety   . 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 Wears glasses   . HOH (hard of hearing)     left  . Chicken pox   . Shortness of breath dyspnea     WITH EXERTION    . Heart murmur     MVP  . History of blood transfusion     1975 - leg fracture   Past Surgical History  Procedure Laterality Date  . Tubal ligation  1995  . Tibia fracture surgery Right 1975    rt  . Nasal sinus surgery  1975  . Plantar fascia release  1968    both feet  . Tonsillectomy  1951  . Carpal tunnel release Right 03/31/2013    Procedure: RIGHT CARPAL TUNNEL RELEASE;  Surgeon: Nicki Reaper, MD;  Location: [facility];  Service: Orthopedics;  Laterality: Right;  . Rotator cuff repair Right 1998  . Rotator cuff repair Right   . Endarterectomy Right 05/03/2015    Procedure: RIGHT CAROTID ENDARTERECTOMY  ;  Surgeon: Fransisco Hertz, MD;  Location: [facility] OR;  Service: Vascular;  Laterality: Right;   Family History  Problem Relation Age of Onset  . Adopted: Yes  . Osteoporosis Mother   . Heart disease Mother   . Hypertension Mother   . Hyperlipidemia Mother   . Stroke Mother   . Arthritis Mother   . Diabetes Father   . Heart attack Father   . Colon cancer Neg Hx   . Cancer Sister     breast   Social History  Substance Use Topics  . Smoking status: Never Smoker   . Smokeless tobacco: Never Used  . Alcohol Use: 2.4 oz/week    4 Cans of beer per week   OB History    No data available     Review of Systems  Constitutional: Negative for appetite change and fatigue.  HENT: Negative for congestion, ear discharge and sinus pressure.        Patient states that she's had sinus congestion with yellow-green drainage for couple days  Eyes: Negative for discharge.  Respiratory: Negative for cough.   Cardiovascular: Negative for chest pain.  Gastrointestinal: Negative for abdominal pain and diarrhea.  Genitourinary: Negative for frequency and hematuria.  Musculoskeletal: Negative for back pain.  Skin: Negative for rash.  Neurological: Positive for headaches. Negative for seizures.  Psychiatric/Behavioral: Negative for hallucinations.      Allergies  Sulfamethoxazole  Home Medications   Prior to Admission medications   Medication Sig Start Date End Date Taking? Authorizing Provider  acetaminophen (TYLENOL) 500 MG tablet Take 1,000 mg by mouth every 6 (six) hours as needed for moderate pain.   Yes Historical Provider, MD  ALPRAZolam (XANAX) 0.25 MG tablet TAKE 1 TABLET BY MOUTH DAILY AS NEEDED Patient taking differently: TAKE 1 TABLET BY MOUTH DAILY AS NEEDED FOR ANXIETY 04/06/15  Yes Lorre Munroe, NP  Ascorbic Acid (VITAMIN C) 1000 MG tablet Take 1,000 mg by mouth daily.   Yes Historical Provider, MD  aspirin EC 81 MG tablet Take 1 tablet (81 mg total) by mouth daily. 03/08/15  Yes  Vesta Mixer, MD  atorvastatin (LIPITOR) 40 MG tablet Take 1 tablet (40 mg total) by mouth daily. 03/08/15  Yes Vesta Mixer, MD  Cholecalciferol (VITAMIN D3) 1000 UNITS CAPS Take 1 capsule by mouth daily.    Yes Historical Provider, MD  gabapentin (NEURONTIN) 100 MG capsule Take 100 mg by mouth at bedtime.  01/06/14  Yes Historical Provider, MD  loratadine (CLARITIN) 10 MG tablet Take 10 mg by mouth daily.     Yes Historical Provider, MD  losartan (COZAAR) 50 MG tablet Take 1 tablet (50 mg total) by mouth daily. Patient taking differently: Take 50 mg by mouth every evening.  03/08/15  Yes Vesta Mixer, MD  meloxicam (MOBIC) 15 MG tablet Take 15 mg by mouth daily.  01/06/14  Yes Historical Provider, MD  metoprolol (LOPRESSOR) 25 MG tablet Take 1 tablet (25 mg total) by mouth 2 (two) times daily. 03/08/15  Yes Vesta Mixer, MD  Multiple Vitamin (MULTIVITAMIN) tablet Take 1 tablet by mouth daily.     Yes Historical Provider, MD  oxyCODONE (ROXICODONE) 5 MG immediate release tablet Take 1 tablet (5 mg total) by mouth every 6 (six) hours as needed. Patient taking differently: Take 5 mg by mouth every 6 (six) hours as needed for severe pain.  05/04/15  Yes Samantha J Rhyne, PA-C  sertraline (ZOLOFT) 50 MG tablet TAKE 2 TABLETS BY MOUTH EVERY DAY 06/01/14  Yes Lorre Munroe, NP  valACYclovir (VALTREX) 500 MG tablet TAKE 1 TABLET BY MOUTH DAILY CAN IN CREASE TO 2 FOR 5 DAYS IN THE EVENT OF RECURRENCE Patient taking differently: TAKE 1 TABLET BY MOUTH DAILY CAN IN CREASE TO 2 FOR 5 DAYS IN THE EVENT OF RECURRENCE AS NEEDED 09/23/13  Yes Tereso Newcomer, MD  amoxicillin (AMOXIL) 500 MG capsule Take 1 capsule (500 mg total) by mouth 3 (three) times daily. 05/06/15   Bethann Berkshire, MD  HYDROcodone-acetaminophen (NORCO/VICODIN) 5-325 MG tablet Take 1 tablet by mouth every 6 (six) hours as needed for moderate pain. 05/06/15   Bethann Berkshire, MD   BP 141/68 mmHg  Pulse 75  Temp(Src) 98 F (36.7 C) (Oral)  Resp  15  Ht 5\' 5"  (1.651 m)  Wt 155 lb (70.308 kg)  BMI 25.79 kg/m2  SpO2 98%  LMP 12/29/2011 Physical Exam  Constitutional: She is oriented to person, place, and time. She appears well-developed.  HENT:  Head: Normocephalic.  Healing incision right sided neck  Eyes: Conjunctivae and EOM are normal. No scleral icterus.  Neck: Neck supple. No thyromegaly present.  Cardiovascular: Normal rate and regular rhythm.  Exam reveals no gallop and no friction rub.   No murmur heard. Pulmonary/Chest: No stridor. She has no wheezes. She has  no rales. She exhibits no tenderness.  Abdominal: She exhibits no distension. There is no tenderness. There is no rebound.  Musculoskeletal: Normal range of motion. She exhibits no edema.  Lymphadenopathy:    She has no cervical adenopathy.  Neurological: She is oriented to person, place, and time. She exhibits normal muscle tone. Coordination normal.  Skin: No rash noted. No erythema.  Psychiatric: She has a normal mood and affect. Her behavior is normal.    ED Course  Procedures (including critical care time) Labs Review Labs Reviewed - No data to display  Imaging Review Ct Angio Head W/cm &/or Wo Cm  05/06/2015  CLINICAL DATA:  Right carotid endarterectomy 3 days ago. New onset headache beginning at 6 p.m. today. EXAM: CT ANGIOGRAPHY HEAD AND NECK TECHNIQUE: Multidetector CT imaging of the head and neck was performed using the standard protocol during bolus administration of intravenous contrast. Multiplanar CT image reconstructions and MIPs were obtained to evaluate the vascular anatomy. Carotid stenosis measurements (when applicable) are obtained utilizing NASCET criteria, using the distal internal carotid diameter as the denominator. CONTRAST:  100 mL Isovue 370. The first dose of 50 mL was mistimed due to scanner malfunction. The patient was re-dosed an additional 50 ml of contrast for a total of . COMPARISON:  None. FINDINGS: CT HEAD Brain: Noncontrast  images the brain demonstrate no acute infarct, hemorrhage, or mass lesion. The ventricles are of normal size. The basal ganglia and insular ribbon is intact bilaterally. No significant extra-axial fluid collection is present. Calvarium and skull base: Within normal limits. Paranasal sinuses: Fluid levels are present in the maxillary sinuses bilaterally. Mucosal thickening is evident throughout the ethmoid air cells. A small fluid level is present in the left frontal sinus and bilateral sphenoid sinuses. The mastoid air cells are clear. Orbits: The globes and orbits are intact. CTA NECK Aortic arch: There is a common origin of the left common carotid artery in the innominate artery. Focal calcification is present at the origin of the left subclavian artery without a significant stenosis. Atherosclerotic calcifications are present along the undersurface of the aorta. There is no aneurysm or stenosis. Right carotid system: Proximal right common carotid artery is within normal limits. A right carotid endarterectomy is noted. There is still fluid and gas in the right neck following surgery 3 days ago. The endarterectomy is patent. The cervical right ICA above this level is normal. Left carotid system: The left common carotid artery is within normal limits. Atherosclerotic changes are present at the cervical left ICA. The lumen is narrowed to 2 mm. This compares with a more distal lumen of 4 mm. The distal left ICA slightly smaller than the right. Vertebral arteries:The vertebral arteries originate from the subclavian arteries bilaterally. There is no focal stenosis within either vertebral artery in the neck. The left vertebral artery is slightly dominant to the right. Skeleton: Endplate degenerative changes are most evident at C4-5 on the right and bilaterally at C5-6 and C6-7. Uncovertebral spurring an osseous foraminal narrowing is most evident on the left at C5-6 and C6-7. Other neck: The soft tissues the neck are  remarkable for recent surgery on the right. No significant adenopathy is present. There remains some mass effect on the airway with displacement to left but no airway compromise. The salivary glands are within normal limits. The thyroid is unremarkable. The lung apices demonstrate mild centrilobular emphysema. CTA HEAD Anterior circulation: Atherosclerotic calcifications are present within the cavernous internal carotid arteries bilaterally. There is no focal  stenosis through the ICA termini. The A1 and M1 segments are normal. The left A1 segment is dominant. The anterior communicating artery is patent. The MCA bifurcations are intact bilaterally. Perfusion of the right MCA branch vessels is more robust than on the left. ACA branch vessels are normal bilaterally. Posterior circulation: The left vertebral artery is the dominant vessel. The PICA origins are visualized and normal bilaterally. The basilar artery is small. The left posterior cerebral artery originates from the basilar tip. The right posterior cerebral artery is of fetal type. The PCA branch vessels are within normal limits. Venous sinuses: The dural sinuses are patent. The left transverse sinus is dominant. The straight sinus and deep cerebral veins are patent. The cortical veins are unremarkable. Anatomic variants: Fetal type right posterior cerebral artery. Delayed phase: The postcontrast images demonstrate no pathologic enhancement. IMPRESSION: 1. Right carotid endarterectomy is intact. 2. Increased conspicuity of right MCA branch vessels compared to the left. This suggests right MCA territory hyperperfusion following endarterectomy. 3. No focal occlusion or stenosis. 4. 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 5. The posterior circulation is intact. 6. Mild degenerative changes of the cervical spine. 7. Emphysema. 8. Fluid levels within the paranasal sinuses compatible with sinusitis. Electronically Signed   By: Marin Roberts M.D.   On:  05/06/2015 22:04   Ct Angio Neck W/cm &/or Wo/cm  05/06/2015  CLINICAL DATA:  Right carotid endarterectomy 3 days ago. New onset headache beginning at 6 p.m. today. EXAM: CT ANGIOGRAPHY HEAD AND NECK TECHNIQUE: Multidetector CT imaging of the head and neck was performed using the standard protocol during bolus administration of intravenous contrast. Multiplanar CT image reconstructions and MIPs were obtained to evaluate the vascular anatomy. Carotid stenosis measurements (when applicable) are obtained utilizing NASCET criteria, using the distal internal carotid diameter as the denominator. CONTRAST:  100 mL Isovue 370. The first dose of 50 mL was mistimed due to scanner malfunction. The patient was re-dosed an additional 50 ml of contrast for a total of . COMPARISON:  None. FINDINGS: CT HEAD Brain: Noncontrast images the brain demonstrate no acute infarct, hemorrhage, or mass lesion. The ventricles are of normal size. The basal ganglia and insular ribbon is intact bilaterally. No significant extra-axial fluid collection is present. Calvarium and skull base: Within normal limits. Paranasal sinuses: Fluid levels are present in the maxillary sinuses bilaterally. Mucosal thickening is evident throughout the ethmoid air cells. A small fluid level is present in the left frontal sinus and bilateral sphenoid sinuses. The mastoid air cells are clear. Orbits: The globes and orbits are intact. CTA NECK Aortic arch: There is a common origin of the left common carotid artery in the innominate artery. Focal calcification is present at the origin of the left subclavian artery without a significant stenosis. Atherosclerotic calcifications are present along the undersurface of the aorta. There is no aneurysm or stenosis. Right carotid system: Proximal right common carotid artery is within normal limits. A right carotid endarterectomy is noted. There is still fluid and gas in the right neck following surgery 3 days ago. The  endarterectomy is patent. The cervical right ICA above this level is normal. Left carotid system: The left common carotid artery is within normal limits. Atherosclerotic changes are present at the cervical left ICA. The lumen is narrowed to 2 mm. This compares with a more distal lumen of 4 mm. The distal left ICA slightly smaller than the right. Vertebral arteries:The vertebral arteries originate from the subclavian arteries bilaterally. There is no  focal stenosis within either vertebral artery in the neck. The left vertebral artery is slightly dominant to the right. Skeleton: Endplate degenerative changes are most evident at C4-5 on the right and bilaterally at C5-6 and C6-7. Uncovertebral spurring an osseous foraminal narrowing is most evident on the left at C5-6 and C6-7. Other neck: The soft tissues the neck are remarkable for recent surgery on the right. No significant adenopathy is present. There remains some mass effect on the airway with displacement to left but no airway compromise. The salivary glands are within normal limits. The thyroid is unremarkable. The lung apices demonstrate mild centrilobular emphysema. CTA HEAD Anterior circulation: Atherosclerotic calcifications are present within the cavernous internal carotid arteries bilaterally. There is no focal stenosis through the ICA termini. The A1 and M1 segments are normal. The left A1 segment is dominant. The anterior communicating artery is patent. The MCA bifurcations are intact bilaterally. Perfusion of the right MCA branch vessels is more robust than on the left. ACA branch vessels are normal bilaterally. Posterior circulation: The left vertebral artery is the dominant vessel. The PICA origins are visualized and normal bilaterally. The basilar artery is small. The left posterior cerebral artery originates from the basilar tip. The right posterior cerebral artery is of fetal type. The PCA branch vessels are within normal limits. Venous sinuses:  The dural sinuses are patent. The left transverse sinus is dominant. The straight sinus and deep cerebral veins are patent. The cortical veins are unremarkable. Anatomic variants: Fetal type right posterior cerebral artery. Delayed phase: The postcontrast images demonstrate no pathologic enhancement. IMPRESSION: 1. Right carotid endarterectomy is intact. 2. Increased conspicuity of right MCA branch vessels compared to the left. This suggests right MCA territory hyperperfusion following endarterectomy. 3. No focal occlusion or stenosis. 4. 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 5. The posterior circulation is intact. 6. Mild degenerative changes of the cervical spine. 7. Emphysema. 8. Fluid levels within the paranasal sinuses compatible with sinusitis. Electronically Signed   By: Marin Robertshristopher  Mattern M.D.   On: 05/06/2015 22:04   I have personally reviewed and evaluated these images and lab results as part of my medical decision-making.   EKG Interpretation None      MDM   Final diagnoses:  Acute bacterial sinusitis  Headache disorder    CT head and neck shows carotid endarterectomy is patent. Patient shows sinus infection and CT. Patient sent home with Vicodin and amoxicillin will follow-up with her doctor    Bethann BerkshireJoseph Nykeria Mealing, MD 05/06/15 2230

## 2015-05-06 NOTE — Discharge Instructions (Signed)
Follow up with your md as planned °

## 2015-05-06 NOTE — ED Notes (Signed)
Pt. Coming from home via [facility] c/o sudden onset headache around 1800. Pt. Recently here for right carotid surgery last week. Pt. Describes her headache as right-sided headache that's a stabbing pain. Pt. Also nauseous en route and given 8mg  zofran with some relief. EMS reports no neuro deficits. Pt. BP elevated en route with history of the same. Pt. States she missed her afternoon dose of lopressor. Pt. Also hx of chronic back pain. EMS reports 12 lead as unremarkable and patient has no orthostatic changes. Pt. Aox4.

## 2015-05-06 NOTE — ED Notes (Signed)
Pt verbalized understanding of d/c instructions and has no further questions. Pt stable and nAD. Pt d/c home with family driving 

## 2015-05-06 NOTE — ED Notes (Signed)
Pt placed on bedpan by this RN.

## 2015-05-10 ENCOUNTER — Encounter: Payer: Self-pay | Admitting: Vascular Surgery

## 2015-05-10 ENCOUNTER — Other Ambulatory Visit: Payer: Self-pay | Admitting: Internal Medicine

## 2015-05-10 NOTE — Telephone Encounter (Signed)
Rx called in as prescribed 

## 2015-05-10 NOTE — Telephone Encounter (Signed)
Pt had a f/u on 02/21/15, last refilled on 04/06/15 #30 with 0 refills, please advise

## 2015-05-10 NOTE — Telephone Encounter (Signed)
Ok to phone in Xanax 

## 2015-05-12 ENCOUNTER — Telehealth: Payer: Self-pay | Admitting: Vascular Surgery

## 2015-05-12 NOTE — Telephone Encounter (Signed)
-----   Message from Sharee PimpleMarilyn K McChesney, RN sent at 05/03/2015  4:23 PM EDT ----- Regarding: schedule   ----- Message -----    From: Fransisco HertzBrian L Chen, MD    Sent: 05/03/2015   2:30 PM      To: [address] Charge Pool  Rebeca AlertMary J Goetsch [phone] 06/20/1945  Procedure:  1.  R CEA 2.  R carotid duplex  Asst: Doreatha MassedSamantha Rhyne, PAC   Follow-up: 2-3 weeks

## 2015-05-12 NOTE — Telephone Encounter (Signed)
sched appt 4/21 at 9:15. Spoke w/ pt's friend who is staying w/ pt to inform them of appt.

## 2015-05-17 NOTE — Discharge Summary (Signed)
[facility] Discharge Summary   Patient ID:  Heather [name] MRN: [id number] DOB/AGE: Mar 06, 1945 70 y.o.  Admit date: 05/03/2015 Discharge date: 05/04/2015 Date of Surgery: 05/03/2015 Surgeon: Surgeon(s): Fransisco Hertz, MD  Admission Diagnosis: Right carotid artery stenosis I65.21  Discharge Diagnoses:  Right carotid artery stenosis I65.21  Secondary Diagnoses: Past Medical History  Diagnosis Date  . MVP (mitral valve prolapse)     echo 1986-mild  . Hypertension   . Depression   . Anxiety   . 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 Wears glasses   . HOH (hard of hearing)     left  . Chicken pox   . Shortness of breath dyspnea     WITH EXERTION    . Heart murmur     MVP  . History of blood transfusion     1975 - leg fracture    Procedure(s): RIGHT CAROTID ENDARTERECTOMY   Discharged Condition: good  HPI: Heather [name] is a 70 y.o. (08/03/1945) female who presents with chief complaint: return from cardiology. 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 I had the R carotid repeated: 80-99%. Patient has no history of TIA or stroke symptom. The patient has never had amaurosis fugax or monocular blindness. The patient has never had facial drooping or hemiplegia. The patient has never had receptive or expressive aphasia.    Hospital Course:  Heather [name] is a 70 y.o. female is S/P  Procedure(s): RIGHT CAROTID ENDARTERECTOMY  Subjective: C/o soreness; no trouble swallowing-only throat soreness Neuro intact, minimal right angle of mouth droop, symmetric smile, motor 5/5 sym, neck soft without hematoma  - marginal mandibular palsy nearly resolved - follow up in 2 weeks for wound check  Significant Diagnostic Studies: CBC Lab Results  Component Value Date   WBC 8.4 05/04/2015   HGB 9.8* 05/04/2015   HCT 28.8*  05/04/2015   MCV 93.5 05/04/2015   PLT 195 05/04/2015    BMET    Component Value Date/Time   NA 137 05/04/2015 0450   K 4.2 05/04/2015 0450   CL 106 05/04/2015 0450   CO2 24 05/04/2015 0450   GLUCOSE 109* 05/04/2015 0450   BUN 11 05/04/2015 0450   CREATININE 0.74 05/04/2015 0450   CREATININE 0.95* 03/29/2015 0925   CALCIUM 8.9 05/04/2015 0450   GFRNONAA >60 05/04/2015 0450   GFRAA >60 05/04/2015 0450   COAG Lab Results  Component Value Date   INR 1.15 05/02/2015     Disposition:  Discharge to :Home Discharge Instructions    CAROTID Sugery: Call MD for difficulty swallowing or speaking; weakness in arms or legs that is a new symtom; severe headache.  If you have increased swelling in the neck and/or  are having difficulty breathing, CALL 911    Complete by:  As directed      Call MD for:  redness, tenderness, or signs of infection (pain, swelling, bleeding, redness, odor or green/yellow discharge around incision site)    Complete by:  As directed      Call MD for:  severe or increased pain, loss or decreased feeling  in affected limb(s)    Complete by:  As directed      Call MD for:  temperature >100.5    Complete by:  As directed      Discharge patient    Complete by:  As directed   Discharge  pt to home     Discharge wound care:    Complete by:  As directed   Shower daily with soap and water starting 05/05/15     Driving Restrictions    Complete by:  As directed   No driving for 2 weeks     Lifting restrictions    Complete by:  As directed   No lifting for 2 weeks     Resume previous diet    Complete by:  As directed             Medication List    STOP taking these medications        ALPRAZolam 0.25 MG tablet  Commonly known as:  XANAX     CALTRATE 600+D 600-400 MG-UNIT tablet  Generic drug:  Calcium Carbonate-Vitamin D     HYDROcodone-acetaminophen 5-325 MG tablet  Commonly known as:  NORCO/VICODIN     lidocaine 2 % jelly  Commonly known as:   XYLOCAINE      TAKE these medications        aspirin EC 81 MG tablet  Take 1 tablet (81 mg total) by mouth daily.     atorvastatin 40 MG tablet  Commonly known as:  LIPITOR  Take 1 tablet (40 mg total) by mouth daily.     gabapentin 100 MG capsule  Commonly known as:  NEURONTIN  Take 100 mg by mouth at bedtime.     loratadine 10 MG tablet  Commonly known as:  CLARITIN  Take 10 mg by mouth daily.     losartan 50 MG tablet  Commonly known as:  COZAAR  Take 1 tablet (50 mg total) by mouth daily.     meloxicam 15 MG tablet  Commonly known as:  MOBIC  Take 15 mg by mouth daily.     metoprolol tartrate 25 MG tablet  Commonly known as:  LOPRESSOR  Take 1 tablet (25 mg total) by mouth 2 (two) times daily.     multivitamin tablet  Take 1 tablet by mouth daily.     oxyCODONE 5 MG immediate release tablet  Commonly known as:  ROXICODONE  Take 1 tablet (5 mg total) by mouth every 6 (six) hours as needed.     sertraline 50 MG tablet  Commonly known as:  ZOLOFT  TAKE 2 TABLETS BY MOUTH EVERY DAY     valACYclovir 500 MG tablet  Commonly known as:  VALTREX  TAKE 1 TABLET BY MOUTH DAILY CAN IN CREASE TO 2 FOR 5 DAYS IN THE EVENT OF RECURRENCE     vitamin C 1000 MG tablet  Take 1,000 mg by mouth daily.     Vitamin D3 1000 units Caps  Take 1 capsule by mouth daily.       Verbal and written Discharge instructions given to the patient. Wound care per Discharge AVS     Follow-up Information    Follow up with Leonides Sakehen, Koralyn Prestage, MD In 2 weeks.   Specialties:  Vascular Surgery, Cardiology   Why:  Office will call you to arrange your appt (sent)   Contact information:   [address] [place] [address] [phone]       Signed: Clinton GallantCOLLINS, EMMA [facility] 05/17/2015, 1:32 PM  Addendum  I have independently interviewed and examined the patient, and I agree with the physician assistant's discharge summary.  Pt underwent uneventful R CEA except more distal disease than expected.   Retraction for this lead to a marginal mandibular palsy which resolved by  discharge.  The patient will follow up in the office in 2 weeks for wound check.  Leonides Sake, MD [facility] [place] Office: [phone] Pager: [phone]  05/18/2015, 7:31 PM   --- For VQI Registry use --- Instructions: Press F2 to tab through selections.  Delete question if not applicable.   Modified Rankin score at D/C (0-6): Rankin Score=0  IV medication needed for:  1. Hypertension: No 2. Hypotension: No  Post-op Complications: No  1. Post-op CVA or TIA: No  If yes: Event classification (right eye, left eye, right cortical, left cortical, verterobasilar, other):   If yes: Timing of event (intra-op, <6 hrs post-op, >=6 hrs post-op, unknown):   2. CN injury: No    3. Myocardial infarction: No  If yes: Dx by (EKG or clinical, Troponin):   4.  CHF: No  5.  Dysrhythmia (new): No  6. Wound infection: No  7. Reperfusion symptoms: No  8. Return to OR: No  If yes: return to OR for (bleeding, neurologic, other CEA incision, other):   Discharge medications: Statin use:  Yes ASA use:  Yes Beta blocker use:  Yes ACE-Inhibitor use:  No  for medical reason   P2Y12 Antagonist use: [ x] None,  Plavix,  Plasugrel,  Ticlopinine,  Ticagrelor,  Other,  No for medical reason,  Non-compliant,  Not-indicated Anti-coagulant use:  [x ] None,  Warfarin,  Rivaroxaban,  Dabigatran,  Other,  No for medical reason,  Non-compliant,  Not-indicated

## 2015-05-18 ENCOUNTER — Other Ambulatory Visit: Payer: Self-pay | Admitting: *Deleted

## 2015-05-18 ENCOUNTER — Telehealth: Payer: Self-pay | Admitting: Internal Medicine

## 2015-05-18 MED ORDER — METOPROLOL TARTRATE 25 MG PO TABS: 25.0000 mg | ORAL_TABLET | Freq: Two times a day (BID) | ORAL | Status: DC

## 2015-05-18 NOTE — Progress Notes (Signed)
Postoperative Visit   History of Present Illness  Heather [name] is a 70 y.o. female who presents for postoperative follow-up for: R CEA (Date: 05/03/15).  The patient's neck incision is healing: dermabond still adherent.  The patient has had no stroke or TIA symptoms.  She had a episode of cerebral hyperperfusion leading her to ED with HTN and severe HA.  This has since resolved.  She notes some radiating pain from incision to right ear and persistence in facial asx.  She feels her swallow is altered.  She also developed a coating on her tongue after taking Amoxicillin given in the ED  For VQI Use Only  PRE-ADM LIVING: Home  AMB STATUS: Ambulatory  Social History   Social History  . Marital Status: Divorced    Spouse Name: N/A  . Number of Children: N/A  . Years of Education: N/A   Occupational History  . Not on file.   Social History Main Topics  . Smoking status: Never Smoker   . Smokeless tobacco: Never Used  . Alcohol Use: 2.4 oz/week    4 Cans of beer per week  . Drug Use: No  . Sexual Activity: Yes   Other Topics Concern  . Not on file   Social History Narrative    Current Outpatient Prescriptions on File Prior to Visit  Medication Sig Dispense Refill  . acetaminophen (TYLENOL) 500 MG tablet Take 1,000 mg by mouth every 6 (six) hours as needed for moderate pain.    Marland Kitchen. ALPRAZolam (XANAX) 0.25 MG tablet TAKE 1 TABLET BY MOUTH DAILY AS NEEDED 30 tablet 0  . amoxicillin (AMOXIL) 500 MG capsule Take 1 capsule (500 mg total) by mouth 3 (three) times daily. 30 capsule 0  . Ascorbic Acid (VITAMIN C) 1000 MG tablet Take 1,000 mg by mouth daily.    Marland Kitchen. aspirin EC 81 MG tablet Take 1 tablet (81 mg total) by mouth daily.    Marland Kitchen. atorvastatin (LIPITOR) 40 MG tablet Take 1 tablet (40 mg total) by mouth daily. 30 tablet 11  . Cholecalciferol (VITAMIN D3) 1000 UNITS CAPS Take 1 capsule by mouth daily.     Marland Kitchen. gabapentin (NEURONTIN) 100 MG capsule Take 100 mg by mouth at bedtime.       Marland Kitchen. HYDROcodone-acetaminophen (NORCO/VICODIN) 5-325 MG tablet Take 1 tablet by mouth every 6 (six) hours as needed for moderate pain. 20 tablet 0  . loratadine (CLARITIN) 10 MG tablet Take 10 mg by mouth daily.      Marland Kitchen. losartan (COZAAR) 50 MG tablet Take 1 tablet (50 mg total) by mouth daily. (Patient taking differently: Take 50 mg by mouth every evening. ) 30 tablet 11  . meloxicam (MOBIC) 15 MG tablet Take 15 mg by mouth daily.     . metoprolol tartrate (LOPRESSOR) 25 MG tablet Take 1 tablet (25 mg total) by mouth 2 (two) times daily. 180 tablet 3  . Multiple Vitamin (MULTIVITAMIN) tablet Take 1 tablet by mouth daily.      Marland Kitchen. oxyCODONE (ROXICODONE) 5 MG immediate release tablet Take 1 tablet (5 mg total) by mouth every 6 (six) hours as needed. (Patient taking differently: Take 5 mg by mouth every 6 (six) hours as needed for severe pain. ) 20 tablet 0  . sertraline (ZOLOFT) 50 MG tablet TAKE 2 TABLETS BY MOUTH EVERY DAY 180 tablet 2  . valACYclovir (VALTREX) 500 MG tablet TAKE 1 TABLET BY MOUTH DAILY CAN IN CREASE TO 2 FOR 5 DAYS IN THE  EVENT OF RECURRENCE (Patient taking differently: TAKE 1 TABLET BY MOUTH DAILY CAN IN CREASE TO 2 FOR 5 DAYS IN THE EVENT OF RECURRENCE AS NEEDED) 30 tablet 12   No current facility-administered medications on file prior to visit.    Physical Examination  Filed Vitals:   05/19/15 0859 05/19/15 0902  BP: 122/59 125/64  Pulse: 56 54  Temp: 97.2 F (36.2 C)   Resp: 16    Oral: white coating on middle of tongue  R Neck: Incision is healed, dermabond adherent  Neuro: CN 2-12 are intact, mildly asx smile, Motor strength is 5/5 bilaterally, sensation is grossly intact  Medical Decision Making  Heather [name] is a 70 y.o. 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  Will refer the patient to Speech Therapy to evaluate her swallow in the next 2-4 weeks.  She will follow up with me in one month to see if these sx have resolved.  Her episode of HA and HTN along with CTA are consistent with cerebral hyperperfusion, fortunately this has since resolved as expected.  The patient's neck incision is healing with no stroke symptoms. I discussed in depth with the patient the nature of atherosclerosis, and emphasized the importance of maximal medical management including strict control of blood pressure, blood glucose, and lipid levels, obtaining regular exercise, anti-platelet use and cessation of smoking.   The patient is currently on an antiplatelet: ASA. The patient is currently on a statin: Lipitor. The patient is aware that without maximal medical management the underlying atherosclerotic disease process will progress, limiting the benefit of any interventions. The patient's surveillance will included routine carotid duplex studies which will be completed in: 9 months, at which time the patient will be re-evaluated.   I emphasized the importance of routine surveillance of the carotid arteries as recurrence of stenosis is possible, especially with proper management of underlying atherosclerotic disease. The patient agrees to participate in their maximal medical care and routine surveillance. In regards to her oral sx, the findings support the diagnosis of thrush.  I have prescribed some Nystatin S/S 5 mL PO bid for such.  Thank you for allowing [place] to participate in this patient's care.  Leonides Sake, MD [facility] [place] Office: [phone] Pager: [phone]

## 2015-05-18 NOTE — Telephone Encounter (Signed)
Pt left v/m thanking R Baity NP; pt was seen on 02/21/15; pt saw Dr Imogene Burnhen; pt has had carotid artery surgery and pt doing well. Pt appreciates Pamala Hurry Baity NP finding her problem and referring her to Dr Imogene Burnhen. FYI to Pamala Hurry Baity NP.

## 2015-05-19 ENCOUNTER — Other Ambulatory Visit: Payer: Self-pay

## 2015-05-19 ENCOUNTER — Encounter: Payer: Self-pay | Admitting: Vascular Surgery

## 2015-05-19 ENCOUNTER — Ambulatory Visit (INDEPENDENT_AMBULATORY_CARE_PROVIDER_SITE_OTHER): Payer: Medicare Other | Admitting: Vascular Surgery

## 2015-05-19 VITALS — BP 125/64 | HR 54 | Temp 97.2°F | Resp 16 | Ht 65.0 in | Wt 153.0 lb

## 2015-05-19 DIAGNOSIS — I739 Peripheral vascular disease, unspecified: Principal | ICD-10-CM

## 2015-05-19 DIAGNOSIS — I779 Disorder of arteries and arterioles, unspecified: Secondary | ICD-10-CM

## 2015-05-19 MED ORDER — HYDROCODONE-ACETAMINOPHEN 5-325 MG PO TABS: 1.0000 | ORAL_TABLET | Freq: Four times a day (QID) | ORAL | Status: DC | PRN

## 2015-05-19 MED ORDER — NYSTATIN 100000 UNIT/ML MT SUSP: 5.0000 mL | Freq: Four times a day (QID) | OROMUCOSAL | Status: DC

## 2015-05-19 NOTE — Addendum Note (Signed)
Addended by: Melodye PedMANESS-HARRISON, CHANDA C on: 05/19/2015 03:06 PM   Modules accepted: Orders

## 2015-05-19 NOTE — Telephone Encounter (Signed)
Noted, I will try to call and talk with her.

## 2015-05-22 ENCOUNTER — Other Ambulatory Visit: Payer: Self-pay | Admitting: *Deleted

## 2015-05-22 ENCOUNTER — Telehealth: Payer: Self-pay | Admitting: Vascular Surgery

## 2015-05-22 ENCOUNTER — Other Ambulatory Visit (HOSPITAL_COMMUNITY): Payer: Self-pay | Admitting: Vascular Surgery

## 2015-05-22 DIAGNOSIS — R1310 Dysphagia, unspecified: Secondary | ICD-10-CM

## 2015-05-22 NOTE — Telephone Encounter (Signed)
sched swallow test at [facility] on 05/25/15 at 11:00. Pt aware of appt.  Pt's 1 m f/u was already sched.

## 2015-05-22 NOTE — Telephone Encounter (Signed)
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     Will refer the patient to Speech Therapy to evaluate her swallow in the next 2-4 weeks. She will follow up with me in one month to see if these sx have resolved. ----- Message -----    From: Fransisco HertzBrian L Chen, MD    Sent: 05/22/2015   9:53 AM      To[id number]  Heather AlertMary J [name] [id number] 1945/05/15  Got the follow message:   " We provide Speech Therapy services. We do not do swallow evaluations at this facility. Please call [phone], which is acute rehab services to order a Modified Barium Swallow. Thank you"  - Please refer patient for swallow evaluation

## 2015-05-25 ENCOUNTER — Ambulatory Visit (HOSPITAL_COMMUNITY)
Admission: RE | Admit: 2015-05-25 | Discharge: 2015-05-25 | Disposition: A | Discharge status: Home / Self Care | Payer: Medicare Other | Source: Ambulatory Visit | Attending: Vascular Surgery | Admitting: Vascular Surgery

## 2015-05-25 DIAGNOSIS — R1310 Dysphagia, unspecified: Secondary | ICD-10-CM | POA: Diagnosis present

## 2015-05-25 DIAGNOSIS — R938 Abnormal findings on diagnostic imaging of other specified body structures: Secondary | ICD-10-CM | POA: Diagnosis not present

## 2015-06-02 ENCOUNTER — Other Ambulatory Visit: Payer: Self-pay

## 2015-06-02 DIAGNOSIS — I6529 Occlusion and stenosis of unspecified carotid artery: Secondary | ICD-10-CM

## 2015-06-02 NOTE — Progress Notes (Signed)
Per Dr. Nicky Pughhen's order, I put this referral for speech therapy in EPIC. Patient has already had swallowing evaluation and this is for follow up of that.

## 2015-06-05 ENCOUNTER — Other Ambulatory Visit (INDEPENDENT_AMBULATORY_CARE_PROVIDER_SITE_OTHER): Payer: Medicare Other | Admitting: *Deleted

## 2015-06-05 DIAGNOSIS — E785 Hyperlipidemia, unspecified: Secondary | ICD-10-CM | POA: Diagnosis not present

## 2015-06-05 DIAGNOSIS — I779 Disorder of arteries and arterioles, unspecified: Secondary | ICD-10-CM

## 2015-06-05 DIAGNOSIS — I739 Peripheral vascular disease, unspecified: Secondary | ICD-10-CM

## 2015-06-05 LAB — COMPREHENSIVE METABOLIC PANEL
ALBUMIN: 4 g/dL (ref 3.6–5.1)
ALT: 24 U/L (ref 6–29)
AST: 16 U/L (ref 10–35)
Alkaline Phosphatase: 93 U/L (ref 33–130)
BILIRUBIN TOTAL: 0.4 mg/dL (ref 0.2–1.2)
BUN: 17 mg/dL (ref 7–25)
CALCIUM: 9.5 mg/dL (ref 8.6–10.4)
CO2: 27 mmol/L (ref 20–31)
Chloride: 108 mmol/L (ref 98–110)
Creat: 0.68 mg/dL (ref 0.60–0.93)
GLUCOSE: 110 mg/dL — AB (ref 65–99)
POTASSIUM: 4.3 mmol/L (ref 3.5–5.3)
Sodium: 142 mmol/L (ref 135–146)
Total Protein: 5.9 g/dL — ABNORMAL LOW (ref 6.1–8.1)

## 2015-06-05 LAB — LIPID PANEL
CHOL/HDL RATIO: 2.6 ratio (ref <=5.0)
Cholesterol: 162 mg/dL (ref 125–200)
HDL: 63 mg/dL (ref >=46)
LDL CALC: 77 mg/dL (ref <130)
TRIGLYCERIDES: 111 mg/dL (ref <150)
VLDL: 22 mg/dL (ref <30)

## 2015-06-05 NOTE — Addendum Note (Signed)
Addended by: Tonita PhoenixBOWDEN, Sueko Dimichele K on: 06/05/2015 08:24 AM   Modules accepted: Orders

## 2015-06-07 ENCOUNTER — Ambulatory Visit: Payer: Medicare Other

## 2015-06-08 ENCOUNTER — Ambulatory Visit: Payer: Medicare Other | Attending: Vascular Surgery | Admitting: Speech Pathology

## 2015-06-08 ENCOUNTER — Encounter: Payer: Self-pay | Admitting: Internal Medicine

## 2015-06-08 ENCOUNTER — Telehealth: Payer: Self-pay

## 2015-06-08 DIAGNOSIS — R471 Dysarthria and anarthria: Secondary | ICD-10-CM

## 2015-06-08 DIAGNOSIS — R1312 Dysphagia, oropharyngeal phase: Secondary | ICD-10-CM

## 2015-06-08 DIAGNOSIS — R1310 Dysphagia, unspecified: Secondary | ICD-10-CM | POA: Insufficient documentation

## 2015-06-08 NOTE — Telephone Encounter (Signed)
Pt left v/m;pt was last seen 02/21/15. Pt had carotid artery surgery on June 5,2017. Pt saw surgeon and was given nystatin mouthwash; thrush is slightly better but still a problem. pts speech therapist advised pt to contact PCP and ask for diflucan. Pt has problems getting transportation and cannot make appt.Pt request cb.[name].

## 2015-06-08 NOTE — Patient Instructions (Signed)
SWALLOWING EXERCISES 1. Effortful Swallows - Squeeze hard with the muscles in your neck while you swallow your  saliva or a sip of water - Repeat 20 times, 2-3 times a day, and use whenever you eat or drink  2. Masako Swallow - swallow with your tongue sticking out - Stick tongue out and gently bite tongue with your teeth - Swallow, while holding your tongue with your teeth - Repeat 20 times, 2-3 times a day  3. Pitch Raise - Repeat "he", once per second in as high of a pitch as you can - Repeat 20 times, 2-3 times a day  4. Shaker Exercise - head lift - Lie flat on your back in your bed or on a couch without pillows - Raise your head and look at your feet  - KEEP YOUR SHOULDERS DOWN - HOLD FOR 45 SECONDS, then lower your head back down - Repeat 3 times, 2-3 times a day  5. Mendelsohn Maneuver - "half swallow" exercise - Start to swallow, and keep your Adam's apple up by squeezing hard with the muscles of the throat - Hold the squeeze for 5-7 seconds and then relax - Repeat 20 times, 2-3 times a day  6. Tongue Press - Press your entire tongue as hard as you can against the roof of your mouth for 3-5 seconds - Repeat 20 times, 2-3 times a day  7. Tongue Stretch/Teeth Clean - Move your tongue around the pocket between your gums and teeth, clockwise and then counter-clockwise - Repeat on the back side, clockwise and then counter-clockwise - Repeat 15-20 times, 2-3 times a day  8. Breath Hold - Say "HUH!" loudly, holding your breath tightly at the level of your voice box for 3 seconds - Repeat 20 times, 2-3 times a day  9. Chin pushback - Open your mouth  - Place your fist UNDER your chin near your neck, and push back with your fist for 5 seconds  - Repeat 20 times, 2-3 times a day     10. Open mouth swallow          -Swallow with your mouth open wide          -Repeat 15 times, 2 times a day  11. Stick out your tongue and say "GA-GA-GA" loud and shart           -Repeat  25 sets of 3, 2-3 times a day  12. Say ING-GA loud and exaggerated             - 25 times 2-3 times a day  13. Gargle or pretend to gargle             - 10 times for 3-5 seconds (or as long as you can) 2 times a day   Oral exercises/Swallow exercises  Use the mirror - Do 15x each twice a day  Say "ooo" then "eee"  BIG!  Stick out your tongue and curl tongue up to your nose  Scrape the roof of your mouth from your teeth to your throat   Move the tongue around your top and bottom teeth in a circle, clockwise, then counter clockwise  Click your tongue on the roof of your mouth, like a horse sound  Push out each cheek with your tongue, alternate side to side - push in with your finger to add resistance  Speech exercises - do 5x each, x2-3/day SLOW BIG  SAY THE FOLLOWING- make every sound! Red leather, yellow leather  Big  grocery buggy    Purple baby carriage    [place] Proper copper coffee pot Ripe purple cabbage Three free throws [employer] Ter[employer] Red Bulb, Blue Bulb Flash Message Dave dipped the dessert  [employer] An Art gallery managerngineer for [employer] Five valve levers Six Thick Thistles Stick Double Bubble Gum Fat cows give milk Automatic DataMinnesota Golden Gophers Fat frogs flip freely TXU Corpuck Tommy into bed Get that game to The [place] Travelersreg Freshly Fried Fat Fish Cinnamon aluminum linoleum Black bugs blood Lovely lemon linament Buckle that Air traffic controllerbracket  Tying Tape Takes Time A Shifty Salt Shaker   The gospel of Mark Shirts shrink, shells shouldn't [place] 49ers Take the tackle box Give me five flapjacks Fundamental relatives Call the cat "Buttercup" A calendar of [place] Four floors to cover Yellow oil ointment Fellow lovers of felines Catastrophe in [place] Plump plumbers' plums The church's chimes chimed Telling time until eleven Unique [place] A Three Toed Tree Toad Knapsack Strap Snap Rubber [address] StBaby Buggy Bumpers

## 2015-06-08 NOTE — Therapy (Signed)
[facility] [address] [place], [place], [address] Phone: [phone]   Fax:  [phone]  Speech Language Pathology Evaluation  Patient Details  Name: Heather [name] MRN: [id number] Date of Birth: 07/23/1945 Referring Provider: Dr. Leonides Sake  Encounter Date: 06/08/2015      End of Session - 06/08/15 1155    Visit Number 1   Number of Visits 17   Date for SLP Re-Evaluation 08/03/15   SLP Start Time 0933   SLP Stop Time  1017   SLP Time Calculation (min) 44 min      Past Medical History  Diagnosis Date  . MVP (mitral valve prolapse)     echo 1986-mild  . Hypertension   . Depression   . Anxiety   . 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 Wears glasses   . HOH (hard of hearing)     left  . Chicken pox   . Shortness of breath dyspnea     WITH EXERTION    . Heart murmur     MVP  . History of blood transfusion     1975 - leg fracture    Past Surgical History  Procedure Laterality Date  . Tubal ligation  1995  . Tibia fracture surgery Right 1975    rt  . Nasal sinus surgery  1975  . Plantar fascia release  1968    both feet  . Tonsillectomy  1951  . Carpal tunnel release Right 03/31/2013    Procedure: RIGHT CARPAL TUNNEL RELEASE;  Surgeon: Nicki Reaper, MD;  Location: [facility];  Service: Orthopedics;  Laterality: Right;  . Rotator cuff repair Right 1998  . Rotator cuff repair Right   . Endarterectomy Right 05/03/2015    Procedure: RIGHT CAROTID ENDARTERECTOMY ;  Surgeon: Fransisco Hertz, MD;  Location[facility];  Service: Vascular;  Laterality: Right;    There were no vitals filed for this visit.      Subjective Assessment - 06/08/15 0937    Subjective "When I swallow food collects in my right cheeck"   Currently in Pain? Yes   Pain Score 5    Pain Location Head   Pain Orientation Right   Pain  Descriptors / Indicators Aching   Pain Type Surgical pain   Pain Onset More than a month ago   Pain Frequency Constant   Pain Relieving Factors tylenol   Multiple Pain Sites No            SLP Evaluation [facility] - 06/08/15 0937    SLP Visit Information   SLP Received On 06/08/15   Referring Provider Dr. Leonides Sake   Onset Date 05/03/15   Medical Diagnosis pharyngeal dysphagia   General Information   HPI Heather [name] is a 70 y.o. female who presents s/p R CEA complicated by resolving marginal mandibular nerve palsy, possible CN 12 palsy, thrush. She was referred for an outpatient MBS due to persistent complaint of mildly assymetrical smile and difficulty swallowing per MD note. Pt reports to SLP that she feels that her throat is "tight" and that she could potential choke if she doesnt go slowly when eating, but has not had any choking epsiodes.    Behavioral/Cognition WFL   Mobility Status walks independently   Prior Functional Status   Cognitive/Linguistic Baseline Within functional limits   Type of Home Mobile home  Lives With Significant other   Available Support Friend(s)   Vocation Retired   IT consultantCognition   Overall Cognitive Status Within Functional Limits for tasks assessed   Oral Motor/Sensory Function   Overall Oral Motor/Sensory Function Impaired   Labial ROM Within Functional Limits   Labial Symmetry Within Functional Limits   Labial Strength Reduced Right   Labial Sensation Reduced Right   Labial Coordination WFL   Lingual ROM Reduced right   Lingual Strength Reduced Right   Lingual Sensation Reduced Right   Lingual Coordination Reduced   Facial Symmetry Right droop   Facial Sensation Reduced   Velum Within Functional Limits   Mandible Impaired   Overall Oral Motor/Sensory Function reduced right   Motor Speech   Overall Motor Speech Impaired   Respiration Within functional limits   Phonation Normal   Resonance Within functional limits   Articulation Impaired    Level of Impairment Conversation   Intelligibility Intelligibility reduced   Word 75-100% accurate   Phrase 75-100% accurate   Sentence 75-100% accurate   Conversation 50-74% accurate   Motor Planning Witnin functional limits   Motor Speech Errors Aware   Interfering Components Hearing loss   Effective Techniques Over-articulate;Slow rate           Prior Functional Status - 06/08/15 1001    Prior Functional Status   Cognitive/Linguistic Baseline Within functional limits             Thin Liquid - 06/08/15 1001    Thin Liquid   Thin Liquid Impaired   Presentation Cup   Oral Phase Functional Implications Right lateral sulci pocketing   Pharyngeal  Phase Impairments Throat Clearing - Delayed            Solid - 06/08/15 1001    Solid   Solid Impaired   Presentation Self Fed   Oral Phase Impairments Reduced lingual movement/coordination   Oral Phase Functional Implications Right lateral sulci pocketing   Other Comments pt senses food not clearing pharynx          ADULT SLP TREATMENT - 06/08/15 1229    Cognitive-Linquistic Treatment   Skilled Treatment Initiated training of swallow precautions and dysphagia HEP with usual mod verbal cues, instruction and modeling.              SLP Short Term Goals - 06/08/15 1210    SLP SHORT TERM GOAL #1   Title Pt will follow HEP for dysphagia/dysarthria with rare min A   Time 4   Period Weeks   Status New   SLP SHORT TERM GOAL #2   Title Pt will follow swallow precautions  with mod I   Time 4   Period Weeks   Status New   SLP SHORT TERM GOAL #3   Title Pt will utilize compensations for dysarthria during simple conversation over 12 minutes.    Time 4   Period Weeks   Status New          SLP Long Term Goals - 06/08/15 1216    SLP LONG TERM GOAL #1   Title Pt will follow dysphagia precautions with mod I   Time 8   Period Weeks   Status New   SLP LONG TERM GOAL #2   Title Pt will perform dysphagia and  dysarthria HEP with mod I   Time 8   Period Weeks   Status New   SLP LONG TERM GOAL #3   Title Pt will report less than 2  requests for communication repair/repeat her message a day over 3 sessions.    Time 8   Period Weeks   Status New          Plan - Jul 01, 2015 1214    Clinical Impression Statement Heather [name] a 70 y.o. female s/p Right CEA with post op dysphagia and dysarthria is referred for swallowing/speech therapy after MBSS on 05/25/15 which revealed mild oral and pharyngeal dysphagia and moderate aspiration risk. MBSS revealed reduced pharyngeal strength, modreate pharyngeal residues and sensed aspiration. Right buccal cavity oral residue also noted.  Swallow precautions of chin tuck, throat clear and dry swallow reocmmended. PO trials today revealed pt is not using precautions, altough she did verbalize them. Instruction and ratioanle for precautions provided. Pt required min   A to follow precautions for further PO trials. Pt reports right labial spillage and mild drool. Right linguial and labial numbness is resolving according to Heather [name]. She also presents with mild dysarthira and right labial/linguial weakness. She reports that she is asked to repeat her self during conversations and over the phone, especially when she is tired. Mild incoordination and slur noted during conversation today . I recommend skilled ST to maximize intelliblity and safety of swallow.      Patient will benefit from skilled therapeutic intervention in order to improve the following deficits and impairments:   Dysphagia, oropharyngeal phase - Plan: SLP plan of care cert/re-cert  Dysarthria and anarthria - Plan: SLP plan of care cert/re-cert      G-Codes - 2015-07-01 06/28/1219    Functional Assessment Tool Used NOMS   Functional Limitations Swallowing   Swallow Current Status (Z6109) At least 20 percent but less than 40 percent impaired, limited or restricted   Swallow Goal Status (U0454) At least 1 percent  but less than 20 percent impaired, limited or restricted      Problem List Patient Active Problem List   Diagnosis Date Noted  . Carotid artery stenosis 05/03/2015  . Carotid disease, bilateral (HCC) 03/03/2015  . Chronic back pain 03/03/2014  . Seasonal allergies 03/03/2014  . Genital herpes 03/03/2014  . Hyperlipidemia 03/03/2013  . Prediabetes 03/03/2013  . Anxiety and depression 12/11/2011  . Essential hypertension 11/19/2006  . MITRAL VALVE PROLAPSE 11/19/2006    Lovvorn, Radene Journey MS, CCC-SLP July 01, 2015, 12:31 PM  [facility] [address] [place], [place], [address] Phone: [phone]   Fax:  [phone]  Name: Heather [name] MRN: [id number] Date of Birth: 1945/08/24

## 2015-06-08 NOTE — Telephone Encounter (Signed)
Because I have not seen her for this she will need an apppt

## 2015-06-09 ENCOUNTER — Encounter: Payer: Self-pay | Admitting: Vascular Surgery

## 2015-06-09 ENCOUNTER — Other Ambulatory Visit: Payer: Self-pay | Admitting: Internal Medicine

## 2015-06-09 NOTE — Telephone Encounter (Signed)
Tried to call pt, VM is full 

## 2015-06-09 NOTE — Telephone Encounter (Signed)
Ok to phone in Xanax 

## 2015-06-09 NOTE — Telephone Encounter (Signed)
Last filled 05/10/15--please advise

## 2015-06-09 NOTE — Telephone Encounter (Signed)
Rx called in to pharmacy. 

## 2015-06-12 ENCOUNTER — Ambulatory Visit: Payer: Medicare Other | Admitting: Cardiovascular Disease

## 2015-06-14 ENCOUNTER — Ambulatory Visit: Payer: Medicare Other

## 2015-06-14 DIAGNOSIS — R471 Dysarthria and anarthria: Secondary | ICD-10-CM

## 2015-06-14 DIAGNOSIS — R1312 Dysphagia, oropharyngeal phase: Secondary | ICD-10-CM

## 2015-06-14 NOTE — Therapy (Signed)
[facility] [address] [place], [place], [address] Phone: [phone]   Fax:  [phone]  Speech Language Pathology Treatment  Patient Details  Name: Heather [name] MRN: [id number] Date of Birth: 03-Dec-1945 Referring Provider: Dr. Leonides SakeBrian Chen  Encounter Date: 06/14/2015      End of Session - 06/14/15 1706    Visit Number 2   Number of Visits 17   Date for SLP Re-Evaluation 08/03/15   SLP Start Time 0933   SLP Stop Time  1015   SLP Time Calculation (min) 42 min   Activity Tolerance Patient tolerated treatment well      Past Medical History  Diagnosis Date  . MVP (mitral valve prolapse)     echo 1986-mild  . Hypertension   . Depression   . Anxiety   . 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 Wears glasses   . HOH (hard of hearing)     left  . Chicken pox   . Shortness of breath dyspnea     WITH EXERTION    . Heart murmur     MVP  . History of blood transfusion     1975 - leg fracture    Past Surgical History  Procedure Laterality Date  . Tubal ligation  1995  . Tibia fracture surgery Right 1975    rt  . Nasal sinus surgery  1975  . Plantar fascia release  1968    both feet  . Tonsillectomy  1951  . Carpal tunnel release Right 03/31/2013    Procedure: RIGHT CARPAL TUNNEL RELEASE;  Surgeon: Nicki ReaperGary R Kuzma, MD;  Location: [facility];  Service: Orthopedics;  Laterality: Right;  . Rotator cuff repair Right 1998  . Rotator cuff repair Right   . Endarterectomy Right 05/03/2015    Procedure: RIGHT CAROTID ENDARTERECTOMY ;  Surgeon: Fransisco HertzBrian L Chen, MD;  [facility];  Service: Vascular;  Laterality: Right;    There were no vitals filed for this visit.      Subjective Assessment - 06/14/15 0934    Subjective "Personal things have happened and I didn't get my homework done." (pt became tearful) SLP  comforted pt not to worry about not doing homework the last few days.               ADULT SLP TREATMENT - 06/14/15 0945    General Information   Behavior/Cognition Alert;Cooperative  tearful   Treatment Provided   Treatment provided Dysphagia   Dysphagia Treatment   Temperature Spikes Noted No   Respiratory Status Room air   Treatment Methods Therapeutic exercise   Patient observed directly with PO's No   Other treatment/comments Min to mod A was occasionally necessary with dysphagia exercises. Pt's most difficult exercises were Mendelsohn and Masako, requiring max A from SLP, consistently.    Pain Assessment   Pain Assessment 0-10   Pain Score 5    Pain Location neck   Pain Descriptors / Indicators Aching   Pain Intervention(s) Monitored during session   Cognitive-Linquistic Treatment   Treatment focused on Dysarthria   Skilled Treatment Pt's HEP for overarticulation was completed with pt and she req'd usual mod A to exaggerate speech sounds in repetition of HEP phrases. She req'd min rare A with oral-motor exercises. She was encouraged to complete these HEPs as prescribed.   Assessment / Recommendations / Plan   Plan Continue  with current plan of care   Dysphagia Recommendations   Diet recommendations --  as on modified barium swallow exam   Progression Toward Goals   Progression toward goals Progressing toward goals          SLP Education - 06/14/15 1706    Education provided Yes   Education Details dysphagia HEP, dysarthria HEP   Person(s) Educated Patient   Methods Explanation;Demonstration;Verbal cues;Handout   Comprehension Verbalized understanding;Returned demonstration;Verbal cues required;Need further instruction          SLP Short Term Goals - 06/14/15 1708    SLP SHORT TERM GOAL #1   Title Pt will follow HEP for dysphagia/dysarthria with rare min A   Time 4   Period Weeks   Status On-going   SLP SHORT TERM GOAL #2   Title Pt will follow  swallow precautions  with mod I   Time 4   Period Weeks   Status On-going   SLP SHORT TERM GOAL #3   Title Pt will utilize compensations for dysarthria during simple conversation over 12 minutes.    Time 4   Period Weeks   Status On-going          SLP Long Term Goals - 06/14/15 1709    SLP LONG TERM GOAL #1   Title Pt will follow dysphagia precautions with mod I   Time 8   Period Weeks   Status On-going   SLP LONG TERM GOAL #2   Title Pt will perform dysphagia and dysarthria HEP with mod I   Time 8   Period Weeks   Status On-going   SLP LONG TERM GOAL #3   Title Pt will report less than 2 requests for communication repair/repeat her message a day over 3 sessions.    Time 8   Period Weeks   Status On-going          Plan - 06/14/15 1707    Clinical Impression Statement Pt's dysphagia and dysarthria persist to today. SLP A was needed on all pt's HEPs, and pt admitted she has not completed HEPs as directed due to family issues. Cont'd recommend skilled ST to maximize intelliblity and safety of swallow is recommended.   Speech Therapy Frequency 2x / week   Duration --  8 weeks   Treatment/Interventions Aspiration precaution training;Pharyngeal strengthening exercises;Diet toleration management by SLP;Compensatory techniques;Internal/external aids;Oral motor exercises;Functional tasks;Patient/family education   Potential to Achieve Goals Good      Patient will benefit from skilled therapeutic intervention in order to improve the following deficits and impairments:   Dysphagia, oropharyngeal phase  Dysarthria and anarthria    Problem List Patient Active Problem List   Diagnosis Date Noted  . Carotid artery stenosis 05/03/2015  . Carotid disease, bilateral (HCC) 03/03/2015  . Chronic back pain 03/03/2014  . Seasonal allergies 03/03/2014  . Genital herpes 03/03/2014  . Hyperlipidemia 03/03/2013  . Prediabetes 03/03/2013  . Anxiety and depression 12/11/2011  .  Essential hypertension 11/19/2006  . MITRAL VALVE PROLAPSE 11/19/2006    [facility] ,MS, CCC-SLP  06/14/2015, 5:09 PM  [facility] [address] [place], [place], [address] Phone: [phone]   Fax:  [phone]   Name: Heather [name] MRN: [id number] Date of Birth: 09-21-1945

## 2015-06-15 ENCOUNTER — Ambulatory Visit: Payer: Medicare Other

## 2015-06-15 DIAGNOSIS — R471 Dysarthria and anarthria: Secondary | ICD-10-CM

## 2015-06-15 DIAGNOSIS — R1312 Dysphagia, oropharyngeal phase: Secondary | ICD-10-CM | POA: Diagnosis not present

## 2015-06-15 NOTE — Therapy (Signed)
[facility] [address] [place], [place], [address] Phone: [phone]   Fax:  [phone]  Speech Language Pathology Treatment  Patient Details  Name: Heather [name] MRN: [id number] Date of Birth: 09/30/1945 Referring Provider: Dr. Leonides Sake  Encounter Date: 06/15/2015      End of Session - 06/15/15 0936    Visit Number 3   Number of Visits 17   Date for SLP Re-Evaluation 08/03/15   SLP Start Time 0847   SLP Stop Time  0931   SLP Time Calculation (min) 44 min   Activity Tolerance Patient tolerated treatment well      Past Medical History  Diagnosis Date  . MVP (mitral valve prolapse)     echo 1986-mild  . Hypertension   . Depression   . Anxiety   . 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 Wears glasses   . HOH (hard of hearing)     left  . Chicken pox   . Shortness of breath dyspnea     WITH EXERTION    . Heart murmur     MVP  . History of blood transfusion     1975 - leg fracture    Past Surgical History  Procedure Laterality Date  . Tubal ligation  1995  . Tibia fracture surgery Right 1975    rt  . Nasal sinus surgery  1975  . Plantar fascia release  1968    both feet  . Tonsillectomy  1951  . Carpal tunnel release Right 03/31/2013    Procedure: RIGHT CARPAL TUNNEL RELEASE;  Surgeon: Nicki Reaper, MD;  Location: [facility];  Service: Orthopedics;  Laterality: Right;  . Rotator cuff repair Right 1998  . Rotator cuff repair Right   . Endarterectomy Right 05/03/2015    Procedure: RIGHT CAROTID ENDARTERECTOMY ;  Surgeon: Fransisco Hertz, MD;  Location: [facility] OR;  Service: Vascular;  Laterality: Right;    There were no vitals filed for this visit.      Subjective Assessment - 06/14/15 0934    Subjective "Personal things have happened and I didn't get my homework done." (pt became tearful) SLP  comforted pt not to worry about not doing homework the last few days.               ADULT SLP TREATMENT - 06/15/15 0856    General Information   Behavior/Cognition Alert;Cooperative;Pleasant mood   Treatment Provided   Treatment provided Dysphagia;Cognitive-Linquistic   Dysphagia Treatment   Temperature Spikes Noted No   Respiratory Status Room air   Treatment Methods Therapeutic exercise;Compensation strategy training   Patient observed directly with PO's Yes   Other treatment/comments Pt was SBA withprecautions and then independent with swallow precautions after 4 reps of small sips. With HEP, pt req'd min A rarely. Most difficult exercise was Masako and open mouth swallow. (Pt was not initially prescribed Clair Gulling), but she performed it yesterday anyway.   Pain Assessment   Pain Assessment 0-10   Pain Score 6    Pain Location temple   Pain Descriptors / Indicators Headache   Pain Intervention(s) Monitored during session;Premedicated before session   Cognitive-Linquistic Treatment   Treatment focused on Dysarthria   Skilled Treatment SLP targeted pt's clarity of speech with having pt review her dysarthria HEP focusing on overarticulation. Pt req'd rare min A to overariculate with phrases/sentences    Assessment /  Recommendations / Plan   Plan Continue with current plan of care   Dysphagia Recommendations   Diet recommendations Regular;Thin liquid   Compensations Clear throat after each swallow;Effortful swallow   Postural Changes and/or Swallow Maneuvers Chin tuck   Progression Toward Goals   Progression toward goals Progressing toward goals          SLP Education - 06/14/15 1706    Education provided Yes   Education Details dysphagia HEP, dysarthria HEP   Person(s) Educated Patient   Methods Explanation;Demonstration;Verbal cues;Handout   Comprehension Verbalized understanding;Returned demonstration;Verbal cues required;Need further instruction          SLP  Short Term Goals - 06/14/15 1708    SLP SHORT TERM GOAL #1   Title Pt will follow HEP for dysphagia/dysarthria with rare min A   Time 4   Period Weeks   Status On-going   SLP SHORT TERM GOAL #2   Title Pt will follow swallow precautions  with mod I   Time 4   Period Weeks   Status On-going   SLP SHORT TERM GOAL #3   Title Pt will utilize compensations for dysarthria during simple conversation over 12 minutes.    Time 4   Period Weeks   Status On-going          SLP Long Term Goals - 06/14/15 1709    SLP LONG TERM GOAL #1   Title Pt will follow dysphagia precautions with mod I   Time 8   Period Weeks   Status On-going   SLP LONG TERM GOAL #2   Title Pt will perform dysphagia and dysarthria HEP with mod I   Time 8   Period Weeks   Status On-going   SLP LONG TERM GOAL #3   Title Pt will report less than 2 requests for communication repair/repeat her message a day over 3 sessions.    Time 8   Period Weeks   Status On-going          Plan - 06/15/15 0936    Clinical Impression Statement Pt's dysphagia and dysarthria persist to today. SLP A was needed on all pt's HEPs. Cont'd recommend skilled ST to maximize intelliblity and safety of swallow is recommended.   Speech Therapy Frequency 2x / week   Duration --  8 weeks   Treatment/Interventions Aspiration precaution training;Pharyngeal strengthening exercises;Diet toleration management by SLP;Compensatory techniques;Internal/external aids;Oral motor exercises;Functional tasks;Patient/family education   Potential to Achieve Goals Good      Patient will benefit from skilled therapeutic intervention in order to improve the following deficits and impairments:   Dysarthria and anarthria  Dysphagia, oropharyngeal phase    Problem List Patient Active Problem List   Diagnosis Date Noted  . Carotid artery stenosis 05/03/2015  . Carotid disease, bilateral (HCC) 03/03/2015  . Chronic back pain 03/03/2014  . Seasonal  allergies 03/03/2014  . Genital herpes 03/03/2014  . Hyperlipidemia 03/03/2013  . Prediabetes 03/03/2013  . Anxiety and depression 12/11/2011  . Essential hypertension 11/19/2006  . MITRAL VALVE PROLAPSE 11/19/2006    [facility],Kasem Mozer ,MS, CCC-SLP  06/15/2015, 9:37 AM  [facility] [address] [place], [place], [address] Phone: [phone]   Fax:  [phone]   Name: Heather [name] MRN: [id number] Date of Birth: November 16, 1945

## 2015-06-16 ENCOUNTER — Ambulatory Visit (INDEPENDENT_AMBULATORY_CARE_PROVIDER_SITE_OTHER): Payer: Medicare Other | Admitting: Vascular Surgery

## 2015-06-16 ENCOUNTER — Encounter: Payer: Self-pay | Admitting: Vascular Surgery

## 2015-06-16 ENCOUNTER — Other Ambulatory Visit: Payer: Self-pay | Admitting: Family Medicine

## 2015-06-16 VITALS — BP 148/65 | HR 59 | Ht 65.0 in | Wt 155.0 lb

## 2015-06-16 DIAGNOSIS — M792 Neuralgia and neuritis, unspecified: Secondary | ICD-10-CM

## 2015-06-16 DIAGNOSIS — I779 Disorder of arteries and arterioles, unspecified: Secondary | ICD-10-CM

## 2015-06-16 DIAGNOSIS — I739 Peripheral vascular disease, unspecified: Principal | ICD-10-CM

## 2015-06-16 MED ORDER — NYSTATIN 100000 UNIT/ML MT SUSP: 5.0000 mL | Freq: Four times a day (QID) | OROMUCOSAL | Status: DC

## 2015-06-16 NOTE — Progress Notes (Signed)
Postoperative Visit   History of Present Illness  Heather [name] is a 70 y.o. female who presents for postoperative follow-up for: R CEA (Date: 05/03/15).  The patient's neck incision is healed.  This patient was sent to OT for evaluation of some dysphagia.  She was diagnosed with: Mild pharyngeal phase dysphagia.  She continues to have some swallow issues and is continuing with speech therapy in that regards.  She also had some oral thrush which has improved.  She also noted some posterior auricular pain.  This pain has mostly resolved but now she has a R temporal headache that is recurrent any dull.  Pt notes some difficulty with word finding.   For VQI Use Only  PRE-ADM LIVING: Home  AMB STATUS: Ambulatory  Social History   Social History  . Marital Status: Divorced    Spouse Name: N/A  . Number of Children: N/A  . Years of Education: N/A   Occupational History  . Not on file.   Social History Main Topics  . Smoking status: Never Smoker   . Smokeless tobacco: Never Used  . Alcohol Use: 2.4 oz/week    4 Cans of beer per week  . Drug Use: No  . Sexual Activity: Yes   Other Topics Concern  . Not on file   Social History Narrative    Current Outpatient Prescriptions on File Prior to Visit  Medication Sig Dispense Refill  . acetaminophen (TYLENOL) 500 MG tablet Take 1,000 mg by mouth every 6 (six) hours as needed for moderate pain.    Marland Kitchen. ALPRAZolam (XANAX) 0.25 MG tablet TAKE 1 TABLET BY MOUTH DAILY AS NEEDED 30 tablet 0  . amoxicillin (AMOXIL) 500 MG capsule Take 1 capsule (500 mg total) by mouth 3 (three) times daily. (Patient not taking: Reported on 05/19/2015) 30 capsule 0  . Ascorbic Acid (VITAMIN C) 1000 MG tablet Take 1,000 mg by mouth daily.    Marland Kitchen. aspirin EC 81 MG tablet Take 1 tablet (81 mg total) by mouth daily.    Marland Kitchen. atorvastatin (LIPITOR) 40 MG tablet Take 1 tablet (40 mg total) by mouth daily. 30 tablet 11  . Cholecalciferol (VITAMIN D3) 1000 UNITS CAPS Take  1 capsule by mouth daily.     Marland Kitchen. gabapentin (NEURONTIN) 100 MG capsule Take 100 mg by mouth at bedtime.     Marland Kitchen. HYDROcodone-acetaminophen (NORCO/VICODIN) 5-325 MG tablet Take 1 tablet by mouth every 6 (six) hours as needed for moderate pain. (Patient not taking: Reported on 06/08/2015) 30 tablet 0  . loratadine (CLARITIN) 10 MG tablet Take 10 mg by mouth daily.      Marland Kitchen. losartan (COZAAR) 50 MG tablet Take 1 tablet (50 mg total) by mouth daily. (Patient taking differently: Take 50 mg by mouth every evening. ) 30 tablet 11  . meloxicam (MOBIC) 15 MG tablet Take 15 mg by mouth daily.     . metoprolol tartrate (LOPRESSOR) 25 MG tablet Take 1 tablet (25 mg total) by mouth 2 (two) times daily. 180 tablet 3  . Multiple Vitamin (MULTIVITAMIN) tablet Take 1 tablet by mouth daily.      Marland Kitchen. nystatin (MYCOSTATIN) 100000 UNIT/ML suspension Take 5 mLs (500,000 Units total) by mouth 4 (four) times daily. 60 mL 0  . oxyCODONE (ROXICODONE) 5 MG immediate release tablet Take 1 tablet (5 mg total) by mouth every 6 (six) hours as needed. (Patient not taking: Reported on 05/19/2015) 20 tablet 0  . sertraline (ZOLOFT) 50 MG tablet TAKE 2  TABLETS BY MOUTH EVERY DAY 180 tablet 2  . valACYclovir (VALTREX) 500 MG tablet TAKE 1 TABLET BY MOUTH DAILY CAN IN CREASE TO 2 FOR 5 DAYS IN THE EVENT OF RECURRENCE (Patient taking differently: TAKE 1 TABLET BY MOUTH DAILY CAN IN CREASE TO 2 FOR 5 DAYS IN THE EVENT OF RECURRENCE AS NEEDED) 30 tablet 12   No current facility-administered medications on file prior to visit.    Physical Examination  Filed Vitals:   06/16/15 0909 06/16/15 0911  BP: 158/63 148/65  Pulse: 59    Mouth: mild amount of residual thrush, improved from prior exam  R Neck: Incision is healed, dermabond adherent  Neuro: CN 2-12 are intact, mildly asx smile, Motor strength is 5/5 bilaterally, sensation is grossly intact   Medical Decision Making  Heather [name] is a 70 y.o. female who presents s/p R CEA  complicated with dysphagia, marginal mandibular palsy, nad resolved cerebral hyperperfusion, possible trigeminal neuralgia, thrush   Not certain why pt has had such a complicated recovery given she was a straight forward case.  The hypoglossal nerve was visualized throughout the case.  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  This pt is a right handed patient so her speech centers should be in her Left brain, which is contralateral to the operative site.  She had no evidence of acute CVA on the CTA head and neck obtained previously during a ED visit.  I suspect a MRI would be low yield for the above reasons.  Her sx in the right side of her head are suggestive of trigeminal neuralgia but I have never heard of a carotid endarterectomy causing such.  This patient's dissection was not a high dissection so no injury of the proximal cranial nerves should have happened.  Will get Neurology to evaluate this patient to see if they have any additional ideas what might be etiology for this patient's sx.  Leonides Sake, MD [facility] Office: [phone] Pager: [phone]

## 2015-06-19 ENCOUNTER — Ambulatory Visit: Payer: Medicare Other

## 2015-06-19 DIAGNOSIS — R1310 Dysphagia, unspecified: Secondary | ICD-10-CM

## 2015-06-19 DIAGNOSIS — R471 Dysarthria and anarthria: Secondary | ICD-10-CM

## 2015-06-19 DIAGNOSIS — R1312 Dysphagia, oropharyngeal phase: Secondary | ICD-10-CM | POA: Diagnosis not present

## 2015-06-19 NOTE — Therapy (Signed)
[facility] [address] [place], [place], [address] Phone: [phone]   Fax:  [phone]  Speech Language Pathology Treatment  Patient Details  Name: Heather AlertMary J Latendresse MRN: [id number] Date of Birth: September 06, 1945 Referring Provider: Dr. Leonides SakeBrian Chen  Encounter Date: 06/19/2015      End of Session - 06/19/15 1016    Visit Number 4   Date for SLP Re-Evaluation 08/03/15   SLP Start Time 0933   SLP Stop Time  1015   SLP Time Calculation (min) 42 min   Activity Tolerance Patient tolerated treatment well      Past Medical History  Diagnosis Date  . MVP (mitral valve prolapse)     echo 1986-mild  . Hypertension   . Depression   . Anxiety   . 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 Wears glasses   . HOH (hard of hearing)     left  . Chicken pox   . Shortness of breath dyspnea     WITH EXERTION    . Heart murmur     MVP  . History of blood transfusion     1975 - leg fracture    Past Surgical History  Procedure Laterality Date  . Tubal ligation  1995  . Tibia fracture surgery Right 1975    rt  . Nasal sinus surgery  1975  . Plantar fascia release  1968    both feet  . Tonsillectomy  1951  . Carpal tunnel release Right 03/31/2013    Procedure: RIGHT CARPAL TUNNEL RELEASE;  Surgeon: Nicki ReaperGary R Kuzma, MD;  Location: [facility];  Service: Orthopedics;  Laterality: Right;  . Rotator cuff repair Right 1998  . Rotator cuff repair Right   . Endarterectomy Right 05/03/2015    Procedure: RIGHT CAROTID ENDARTERECTOMY ;  Surgeon: Fransisco HertzBrian L Chen, MD;  [facility];  Service: Vascular;  Laterality: Right;    There were no vitals filed for this visit.      Subjective Assessment - 06/19/15 0938    Subjective Pt's surgeon referring to neurologist due to headaches.               ADULT SLP TREATMENT - 06/19/15 0938     General Information   Behavior/Cognition [name];Cooperative;Pleasant mood   Treatment Provided   Treatment provided Dysphagia   Dysphagia Treatment   Temperature Spikes Noted No   Respiratory Status Room air   Treatment Methods Therapeutic exercise;Compensation strategy training   Patient observed directly with PO's Yes   Type of PO's observed Dysphagia 3 (soft);Thin liquids   Other treatment/comments Pt commented about frustration with oral thrush. SLP provided pt some information about other/dietary ways to alleviate oral thrush. Pt completed Masako without cues and without difficulty (given drops H2O). Open mouth swallow req'd the same thing.   Pain Assessment   Pain Assessment 0-10   Pain Score 1    Pain Location head   Pain Descriptors / Indicators Headache   Pain Intervention(s) Premedicated before session;Monitored during session   Cognitive-Linquistic Treatment   Treatment focused on Dysarthria   Skilled Treatment SLP had pt work with her HEP for focusing on overarticulation - pt req'd SBA. Pt read 5-6 sentence paragraphs with good to excellent success. In conversation pt's speech sounded clearer than last week.   Assessment / Recommendations / Plan   Plan Continue with current plan of care  Dysphagia Recommendations   Diet recommendations Regular;Thin liquid   Compensations Clear throat after each swallow;Effortful swallow   Postural Changes and/or Swallow Maneuvers Chin tuck   Progression Toward Goals   Progression toward goals Progressing toward goals            SLP Short Term Goals - 06/19/15 1017    SLP SHORT TERM GOAL #1   Title Pt will follow HEP for dysphagia/dysarthria with rare min A   Status Achieved   SLP SHORT TERM GOAL #2   Title Pt will follow swallow precautions  with mod I   Time 3   Period Weeks   Status On-going   SLP SHORT TERM GOAL #3   Title Pt will utilize compensations for dysarthria during simple conversation over 12 minutes.    Time 3    Period Weeks   Status On-going          SLP Long Term Goals - 06/19/15 1017    SLP LONG TERM GOAL #1   Title Pt will follow dysphagia precautions with mod I   Time 7   Period Weeks   Status On-going   SLP LONG TERM GOAL #2   Title Pt will perform dysphagia and dysarthria HEP with mod I   Time 7   Period Weeks   Status On-going   SLP LONG TERM GOAL #3   Title Pt will report less than 2 requests for communication repair/repeat her message a day over 3 sessions.    Time 7   Period Weeks   Status On-going          Plan - 06/19/15 1016    Clinical Impression Statement Pt's dysphagia and dysarthria persist to today, aothough dysarthria not as pronounced as last week. Less A was needed on all pt's HEPs. Cont'd recommend skilled ST to maximize intelliblity and safety of swallow is recommended. Pt may decr to once a week in next 1-2 weeks due to progress.   Speech Therapy Frequency 2x / week   Duration --  7 weeks   Treatment/Interventions Aspiration precaution training;Pharyngeal strengthening exercises;Diet toleration management by SLP;Compensatory techniques;Internal/external aids;Oral motor exercises;Functional tasks;Patient/family education   Potential to Achieve Goals Good      Patient will benefit from skilled therapeutic intervention in order to improve the following deficits and impairments:   Dysarthria and anarthria  Dysphagia    Problem List Patient Active Problem List   Diagnosis Date Noted  . Carotid artery stenosis 05/03/2015  . Carotid disease, bilateral (HCC) 03/03/2015  . Chronic back pain 03/03/2014  . Seasonal allergies 03/03/2014  . Genital herpes 03/03/2014  . Hyperlipidemia 03/03/2013  . Prediabetes 03/03/2013  . Anxiety and depression 12/11/2011  . Essential hypertension 11/19/2006  . MITRAL VALVE PROLAPSE 11/19/2006    [facility] ,MS, CCC-SLP  06/19/2015, 10:18 AM  [facility] [address] [place], [place], [address] Phone: [phone]   Fax:  [phone]   Name: KESS MCILWAIN MRN: [id number] Date of Birth: 1945/05/16

## 2015-06-22 ENCOUNTER — Ambulatory Visit: Payer: Medicare Other | Admitting: Speech Pathology

## 2015-06-22 DIAGNOSIS — R1312 Dysphagia, oropharyngeal phase: Secondary | ICD-10-CM | POA: Diagnosis not present

## 2015-06-22 DIAGNOSIS — R1310 Dysphagia, unspecified: Secondary | ICD-10-CM

## 2015-06-22 DIAGNOSIS — R471 Dysarthria and anarthria: Secondary | ICD-10-CM

## 2015-06-22 NOTE — Therapy (Signed)
[facility] [address] [place], [place], [address] Phone: [phone]   Fax:  [phone]  Speech Language Pathology Treatment  Patient Details  Name: Heather [name] MRN: [id number] Date of Birth: 1945/06/13 Referring Provider: Dr. Leonides Sake  Encounter Date: 06/22/2015      End of Session - 06/22/15 1216    Visit Number 5   Number of Visits 17   Date for SLP Re-Evaluation 08/03/15   SLP Start Time 1017   SLP Stop Time  1100   SLP Time Calculation (min) 43 min      Past Medical History  Diagnosis Date  . MVP (mitral valve prolapse)     echo 1986-mild  . Hypertension   . Depression   . Anxiety   . 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 Wears glasses   . HOH (hard of hearing)     left  . Chicken pox   . Shortness of breath dyspnea     WITH EXERTION    . Heart murmur     MVP  . History of blood transfusion     1975 - leg fracture    Past Surgical History  Procedure Laterality Date  . Tubal ligation  1995  . Tibia fracture surgery Right 1975    rt  . Nasal sinus surgery  1975  . Plantar fascia release  1968    both feet  . Tonsillectomy  1951  . Carpal tunnel release Right 03/31/2013    Procedure: RIGHT CARPAL TUNNEL RELEASE;  Surgeon: Nicki Reaper, MD;  Location: [facility];  Service: Orthopedics;  Laterality: Right;  . Rotator cuff repair Right 1998  . Rotator cuff repair Right   . Endarterectomy Right 05/03/2015    Procedure: RIGHT CAROTID ENDARTERECTOMY ;  Surgeon: Fransisco Hertz, MD;  Location: [facility] OR;  Service: Vascular;  Laterality: Right;    There were no vitals filed for this visit.      Subjective Assessment - 06/22/15 1029    Subjective "Pt arrived tearful - I am dealing with my mother's estate and it has had a lot of encounters - I just want get well"               ADULT  SLP TREATMENT - 06/22/15 1030    General Information   Behavior/Cognition Alert;Cooperative;Pleasant mood   Treatment Provided   Treatment provided Dysphagia   Dysphagia Treatment   Temperature Spikes Noted No   Respiratory Status Room air   Patient observed directly with PO's Yes   Type of PO's observed Dysphagia 3 (soft);Thin liquids   Liquids provided via Cup   Other treatment/comments Pt reports "I almost choked this morning - tearful" She reports putting her chin down this morning.  Pt utilizing swallow precautions with mod I with PO trials.    Pain Assessment   Pain Assessment 0-10   Pain Score 5    Pain Location head ache   Pain Descriptors / Indicators Aching   Pain Intervention(s) Monitored during session   Cognitive-Linquistic Treatment   Treatment focused on Dysarthria   Skilled Treatment Pt reports others requesting her to repeat herself "at least 1-3 x" a day. Compensations for dysarthria facilitated with repeating and generating sentences with multisyllabic words, with rare min cues and modeling for overarticulation. Simple conversation with compensations with occasinal min A   Assessment / Recommendations /  Plan   Plan Continue with current plan of care   Dysphagia Recommendations   Diet recommendations Dysphagia 3 (mechanical soft);Thin liquid   Compensations Clear throat after each swallow;Effortful swallow   Postural Changes and/or Swallow Maneuvers Chin tuck   Progression Toward Goals   Progression toward goals Progressing toward goals            SLP Short Term Goals - 06/22/15 1216    SLP SHORT TERM GOAL #1   Title Pt will follow HEP for dysphagia/dysarthria with rare min A   Time 4   Status Achieved   SLP SHORT TERM GOAL #2   Title Pt will follow swallow precautions  with mod I   Time 3   Period Weeks   Status On-going   SLP SHORT TERM GOAL #3   Title Pt will utilize compensations for dysarthria during simple conversation over 12 minutes.    Time 3    Period Weeks   Status On-going          SLP Long Term Goals - 06/22/15 1216    SLP LONG TERM GOAL #1   Title Pt will follow dysphagia precautions with mod I   Time 7   Period Weeks   Status On-going   SLP LONG TERM GOAL #2   Title Pt will perform dysphagia and dysarthria HEP with mod I   Time 7   Period Weeks   Status On-going   SLP LONG TERM GOAL #3   Title Pt will report less than 2 requests for communication repair/repeat her message a day over 3 sessions.    Time 7   Period Weeks   Status On-going          Plan - 06/22/15 1216    Clinical Impression Statement Pt's dysphagia and dysarthria persist to today, aothough dysarthria not as pronounced as last week. Less A was needed on all pt's HEPs. Cont'd recommend skilled ST to maximize intelliblity and safety of swallow is recommended. Pt may decr to once a week in next 1-2 weeks due to progress.      Patient will benefit from skilled therapeutic intervention in order to improve the following deficits and impairments:   Dysarthria and anarthria  Dysphagia    Problem List Patient Active Problem List   Diagnosis Date Noted  . Carotid artery stenosis 05/03/2015  . Carotid disease, bilateral (HCC) 03/03/2015  . Chronic back pain 03/03/2014  . Seasonal allergies 03/03/2014  . Genital herpes 03/03/2014  . Hyperlipidemia 03/03/2013  . Prediabetes 03/03/2013  . Anxiety and depression 12/11/2011  . Essential hypertension 11/19/2006  . MITRAL VALVE PROLAPSE 11/19/2006    Lovvorn, Radene JourneyLaura Ann MS, CCC-SLP 06/22/2015, 12:17 PM  [facility] [address] [place], [place], [address] Phone: [phone]   Fax:  [phone]   Name: Rebeca AlertMary J Santiago MRN: [id number] Date of Birth: 1945/06/08

## 2015-06-23 ENCOUNTER — Encounter: Payer: Self-pay | Admitting: Neurology

## 2015-06-23 ENCOUNTER — Ambulatory Visit (INDEPENDENT_AMBULATORY_CARE_PROVIDER_SITE_OTHER): Payer: Medicare Other | Admitting: Neurology

## 2015-06-23 VITALS — BP 134/72 | HR 60 | Ht 65.0 in | Wt 154.2 lb

## 2015-06-23 DIAGNOSIS — G523 Disorders of hypoglossal nerve: Secondary | ICD-10-CM

## 2015-06-23 DIAGNOSIS — R519 Headache, unspecified: Secondary | ICD-10-CM

## 2015-06-23 DIAGNOSIS — G519 Disorder of facial nerve, unspecified: Secondary | ICD-10-CM

## 2015-06-23 DIAGNOSIS — G510 Bell's palsy: Secondary | ICD-10-CM | POA: Diagnosis not present

## 2015-06-23 DIAGNOSIS — G441 Vascular headache, not elsewhere classified: Secondary | ICD-10-CM

## 2015-06-23 DIAGNOSIS — R51 Headache: Secondary | ICD-10-CM

## 2015-06-23 HISTORY — DX: Headache, unspecified: R51.9

## 2015-06-23 MED ORDER — GABAPENTIN 100 MG PO CAPS: 100.0000 mg | ORAL_CAPSULE | Freq: Three times a day (TID) | ORAL | Status: DC

## 2015-06-23 NOTE — Progress Notes (Signed)
Reason for visit: Dysphagia  Referring physician: Dr. Carlos Leveringhen  Heather [name] is a 70 y.o. female  History of present illness:  Heather [name] is a 70 year old right-handed white female with a history of low back pain. As part of a workup prior to her low back surgery, they found a carotid bruit, and a Doppler study confirmed stenosis of the right internal carotid artery. The patient had a carotid endarterectomy on 05/03/2015. Following surgery, the patient was noted to have some changes in speech and swallowing. She also was noted to have some mild lower facial weakness on the right. The patient underwent a speech therapy evaluation that showed some lingual and pharyngeal weakness resulting in some mild dysphagia. The patient has been told to do a chin tuck for swallowing and this has been helpful. The patient has also developed a headache in the right frontotemporal area that has been persistent, at times quite severe. The headache is a constant boring pain, not throbbing. The patient denies any double vision or loss of vision. She denies any difficulty with language function, but she does have some slurring of speech. She denies numbness or weakness of the arms or legs. The patient has been followed by speech therapy as an outpatient. She comes to this office for an evaluation of the swallowing and speech changes and for evaluation of the headache. A CT scan done of the brain several days after surgery did not show any intracranial abnormalities. The patient did have bilateral maxillary sinus fluid levels, she was treated with antibiotics following surgery. The patient never reported hoarseness of the speech following surgery at any time.  Past Medical History  Diagnosis Date  . MVP (mitral valve prolapse)     echo 1986-mild  . Hypertension   . Depression   . Anxiety   . 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    Orogenital transmission (husband had cold sore; HSV1 culture positive)    . Wears glasses   . HOH (hard of hearing)     left  . Chicken pox   . Shortness of breath dyspnea     WITH EXERTION    . Heart murmur     MVP  . History of blood transfusion     1975 - leg fracture  . Headache 06/23/2015    Past Surgical History  Procedure Laterality Date  . Tubal ligation  1995  . Tibia fracture surgery Right 1975    rt  . Nasal sinus surgery  1975  . Plantar fascia release  1968    both feet  . Tonsillectomy  1951  . Carpal tunnel release Right 03/31/2013    Procedure: RIGHT CARPAL TUNNEL RELEASE;  Surgeon: Nicki ReaperGary R Kuzma, MD;  Location: [facility];  Service: Orthopedics;  Laterality: Right;  . Rotator cuff repair Right 1998  . Rotator cuff repair Right   . Endarterectomy Right 05/03/2015    Procedure: RIGHT CAROTID ENDARTERECTOMY ;  Surgeon: Fransisco HertzBrian L Chen, MD;  [facility];  Service: Vascular;  Laterality: Right;    Family History  Problem Relation Age of Onset  . Adopted: Yes  . Osteoporosis Mother   . Heart disease Mother   . Hypertension Mother   . Hyperlipidemia Mother   . Stroke Mother   . Arthritis Mother   . Diabetes Father   . Heart attack Father   . Colon cancer Neg Hx   . Cancer Sister     breast  Social history:  reports that she has never smoked. She has never used smokeless tobacco. She reports that she drinks about 1.8 oz of alcohol per week. She reports that she does not use illicit drugs.  Medications:  Prior to Admission medications   Medication Sig Start Date End Date Taking? Authorizing Provider  acetaminophen (TYLENOL) 500 MG tablet Take 1,000 mg by mouth every 6 (six) hours as needed for moderate pain.   Yes Historical Provider, MD  ALPRAZolam Prudy Feeler) 0.25 MG tablet TAKE 1 TABLET BY MOUTH DAILY AS NEEDED 06/09/15  Yes Lorre Munroe, NP  amoxicillin (AMOXIL) 500 MG capsule Take 1 capsule (500 mg total) by mouth 3 (three) times daily. 05/06/15  Yes Bethann Berkshire, MD  Ascorbic Acid (VITAMIN C) 1000 MG tablet Take  1,000 mg by mouth daily.   Yes Historical Provider, MD  aspirin EC 81 MG tablet Take 1 tablet (81 mg total) by mouth daily. 03/08/15  Yes Vesta Mixer, MD  atorvastatin (LIPITOR) 40 MG tablet Take 1 tablet (40 mg total) by mouth daily. 03/08/15  Yes Vesta Mixer, MD  Cholecalciferol (VITAMIN D3) 1000 UNITS CAPS Take 1 capsule by mouth daily.    Yes Historical Provider, MD  gabapentin (NEURONTIN) 100 MG capsule Take 100 mg by mouth at bedtime.  01/06/14  Yes Historical Provider, MD  loratadine (CLARITIN) 10 MG tablet Take 10 mg by mouth daily.     Yes Historical Provider, MD  losartan (COZAAR) 50 MG tablet Take 1 tablet (50 mg total) by mouth daily. Patient taking differently: Take 50 mg by mouth every evening.  03/08/15  Yes Vesta Mixer, MD  meloxicam (MOBIC) 15 MG tablet Take 15 mg by mouth daily.  01/06/14  Yes Historical Provider, MD  metoprolol tartrate (LOPRESSOR) 25 MG tablet Take 1 tablet (25 mg total) by mouth 2 (two) times daily. 05/18/15  Yes Vesta Mixer, MD  Multiple Vitamin (MULTIVITAMIN) tablet Take 1 tablet by mouth daily.     Yes Historical Provider, MD  oxyCODONE (ROXICODONE) 5 MG immediate release tablet Take 1 tablet (5 mg total) by mouth every 6 (six) hours as needed. 05/04/15  Yes Samantha J Rhyne, PA-C  sertraline (ZOLOFT) 50 MG tablet TAKE 2 TABLETS BY MOUTH EVERY DAY 06/01/14  Yes Lorre Munroe, NP  valACYclovir (VALTREX) 500 MG tablet TAKE 1 TABLET BY MOUTH DAILY CAN IN CREASE TO 2 FOR 5 DAYS IN THE EVENT OF RECURRENCE Patient taking differently: TAKE 1 TABLET BY MOUTH DAILY CAN IN CREASE TO 2 FOR 5 DAYS IN THE EVENT OF RECURRENCE AS NEEDED 09/23/13  Yes Tereso Newcomer, MD      Allergies  Allergen Reactions  . Sulfamethoxazole Swelling    Unspecified area of swelling    ROS:  Out of a complete 14 system review of symptoms, the patient complains only of the following symptoms, and all other reviewed systems are negative.  Difficulty  swallowing Allergies Headache, slurred speech, difficulty swallowing Depression Restless legs  Blood pressure 134/72, pulse 60, height  (1.651 m), weight 154 lb 4 oz (69.967 kg), last menstrual period 12/29/2011.  Physical Exam  General: The patient is alert and cooperative at the time of the examination.  Eyes: Pupils are equal, round, and reactive to light. Discs are flat bilaterally.  Neck: The neck is supple, no carotid bruits are noted.  Respiratory: The respiratory examination is clear.  Cardiovascular: The cardiovascular examination reveals a regular rate and rhythm, no obvious murmurs or  rubs are noted.  Skin: Extremities are without significant edema.  Neurologic Exam  Mental status: The patient is alert and oriented x 3 at the time of the examination. The patient has apparent normal recent and remote memory, with an apparently normal attention span and concentration ability.  Cranial nerves: Facial symmetry is not present, there is some asymmetry with smile, mild weakness of the peri-oral muscles inferiorly on the right. There is good sensation of the face to pinprick and soft touch bilaterally. The strength of the facial muscles and the muscles to head turning and shoulder shrug are normal bilaterally. Speech is mildly dysarthric, not aphasic. Extraocular movements are full. Visual fields are full. The tongue is midline, and the patient has symmetric elevation of the soft palate. No obvious hearing deficits are noted. Lateral tongue strength appears to be fairly symmetric.  Motor: The motor testing reveals 5 over 5 strength of all 4 extremities. Good symmetric motor tone is noted throughout.  Sensory: Sensory testing is intact to pinprick, soft touch, vibration sensation, and position sense on all 4 extremities, with exception some decrease in pinprick sensation on the medial gastrocnemius area of her right leg. No evidence of extinction is noted.  Coordination:  Cerebellar testing reveals good finger-nose-finger and heel-to-shin bilaterally.  Gait and station: Gait is normal. Tandem gait is normal. Romberg is negative. No drift is seen.  Reflexes: Deep tendon reflexes are symmetric and normal bilaterally. Toes are downgoing bilaterally.   Assessment/Plan:  1. History of right carotid enterectomy  2. 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 Postoperative right frontotemporal headache, possibly sympathetic mediated  The patient likely has sustained a mild stretch injury to the hypoglossal nerve and to the marginal mandibular branch of the facial nerve. I would expect a full recovery over the next several weeks or within the next 2 months. The patient has had a prolonged right frontotemporal headache that may represent a sympathetic mediated headache from the carotid endarterectomy. The patient will be increased on the gabapentin taking 100 mg 3 times daily, we can go up on the dose if the patient requires further treatment and is tolerating the medication. The patient will follow-up in 2-3 months.  Marlan Palau MD 06/23/2015 1:35 PM  [facility] [address] [place], [place] [address]  Phone [phone] Fax [phone]

## 2015-06-27 ENCOUNTER — Telehealth: Payer: Self-pay | Admitting: Neurology

## 2015-06-27 ENCOUNTER — Ambulatory Visit: Payer: Medicare Other

## 2015-06-27 DIAGNOSIS — R1310 Dysphagia, unspecified: Secondary | ICD-10-CM

## 2015-06-27 DIAGNOSIS — R471 Dysarthria and anarthria: Secondary | ICD-10-CM

## 2015-06-27 DIAGNOSIS — R1312 Dysphagia, oropharyngeal phase: Secondary | ICD-10-CM | POA: Diagnosis not present

## 2015-06-27 NOTE — Therapy (Signed)
[facility] [address] [place], [place], [address] Phone: [phone]   Fax:  [phone]  Speech Language Pathology Treatment  Patient Details  Name: Heather AlertMary J [name] MRN: [id number] Date of Birth: Jun 16, 1945 Referring Provider: Dr. Leonides SakeBrian Chen  Encounter Date: 06/27/2015      End of Session - 06/27/15 1143    Visit Number 6   Number of Visits 17   Date for SLP Re-Evaluation 08/03/15   SLP Start Time 1103   SLP Stop Time  1145   SLP Time Calculation (min) 42 min   Activity Tolerance Patient tolerated treatment well      Past Medical History  Diagnosis Date  . MVP (mitral valve prolapse)     echo 1986-mild  . Hypertension   . Depression   . Anxiety   . 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 Wears glasses   . HOH (hard of hearing)     left  . Chicken pox   . Shortness of breath dyspnea     WITH EXERTION    . Heart murmur     MVP  . History of blood transfusion     1975 - leg fracture  . Headache 06/23/2015    Past Surgical History  Procedure Laterality Date  . Tubal ligation  1995  . Tibia fracture surgery Right 1975    rt  . Nasal sinus surgery  1975  . Plantar fascia release  1968    both feet  . Tonsillectomy  1951  . Carpal tunnel release Right 03/31/2013    Procedure: RIGHT CARPAL TUNNEL RELEASE;  Surgeon: Nicki ReaperGary R Kuzma, MD;  Location:  SUR[facility]ERY [facility]ENTER;  Service: Orthopedics;  Laterality: Right;  . Rotator cuff repair Right 1998  . Rotator cuff repair Right   . Endarterectomy Right 05/03/2015    Procedure: RIGHT CAROTID ENDARTERECTOMY ;  Surgeon: Fransisco HertzBrian L Chen, MD;  [facility];  Service: Vascular;  Laterality: Right;    There were no vitals filed for this visit.      Subjective Assessment - 06/27/15 1105    Subjective Pt saw neurologist last week, who thinks pt has stretched nerves that  will resolve in several weeks-2 months.               ADULT SLP TREATMENT - 06/27/15 1109    General Information   Behavior/Cognition Alert;Cooperative;Pleasant mood   Treatment Provided   Treatment provided Dysphagia;Cognitive-Linquistic   Dysphagia Treatment   Temperature Spikes Noted No   Treatment Methods Therapeutic exercise;Compensation strategy training   Patient observed directly with PO's Yes   Type of PO's observed Dysphagia 3 (soft);Thin liquids   Liquids provided via Cup   Other treatment/comments Pt completed HEP with modified independence.She followed precautions with independence.    Pain Assessment   Pain Assessment 0-10   Pain Score 4    Pain Location temple   Pain Descriptors / Indicators Headache   Pain Intervention(s) Monitored during session   Cognitive-Linquistic Treatment   Treatment focused on Dysarthria   Skilled Treatment Pt performed dyarthria HEP (oral-motor exercises) with rare min A. With phrases, pt overarticulated 90% of the time. Pt used compenastions for dysarthria in simple conversation minimally. After SLP mentioned it didn't look like pt was exaggerating movements, she was more successful with using compensations.    Assessment / Recommendations / Plan   Plan Continue with current plan  of care   Dysphagia Recommendations   Diet recommendations Dysphagia 3 (mechanical soft);Thin liquid;Regular   Compensations Clear throat after each swallow;Effortful swallow   Postural Changes and/or Swallow Maneuvers Chin tuck   Progression Toward Goals   Progression toward goals Progressing toward goals            SLP Short Term Goals - 06/27/15 1110    SLP SHORT TERM GOAL #1   Title Pt will follow HEP for dysphagia/dysarthria with rare min A   Status Achieved   SLP SHORT TERM GOAL #2   Title Pt will follow swallow precautions  with mod I   Time --   Period --   Status Achieved   SLP SHORT TERM GOAL #3   Title Pt will utilize compensations  for dysarthria during simple conversation over 12 minutes.    Time 2   Period Weeks   Status On-going          SLP Long Term Goals - 06/27/15 1111    SLP LONG TERM GOAL #1   Title Pt will follow dysphagia precautions with mod I over three sessions   Time 6   Period Weeks   Status Revised   SLP LONG TERM GOAL #2   Title Pt will perform dysphagia and dysarthria HEP with mod I over three sessions   Time 6   Period Weeks   Status Revised   SLP LONG TERM GOAL #3   Title Pt will report less than 2 requests for communication repair/repeat her message a day over 3 sessions.    Time 6   Period Weeks   Status On-going          Plan - 06/27/15 1143    Clinical Impression Statement Pt's dysphagia and dysarthria persist to today. Pt reports people still ask her to repeat at least once/day. Pt's performance on HEPs continues to improve. Cont'd recommend skilled ST to maximize intelliblity and safety of swallow is recommended. Pt may decr to once a week in next 1-2 weeks due to progress.   Speech Therapy Frequency 2x / week   Duration --  6 weeks   Treatment/Interventions Aspiration precaution training;Pharyngeal strengthening exercises;Diet toleration management by SLP;Compensatory techniques;Internal/external aids;Oral motor exercises;Functional tasks;Patient/family education   Potential to Achieve Goals Good      Patient will benefit from skilled therapeutic intervention in order to improve the following deficits and impairments:   Dysarthria and anarthria  Dysphagia    Problem List Patient Active Problem List   Diagnosis Date Noted  . 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 Headache 06/23/2015  . 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 Carotid artery stenosis 05/03/2015  . Carotid disease, bilateral (HCC) 03/03/2015  . Chronic back pain 03/03/2014  . Seasonal allergies 03/03/2014  . Genital herpes 03/03/2014  . Hyperlipidemia 03/03/2013  . Prediabetes 03/03/2013  . Anxiety and  depression 12/11/2011  . Essential hypertension 11/19/2006  . MITRAL VALVE PROLAPSE 11/19/2006    [facility] ,MS, CCC-SLP  06/27/2015, 11:47 AM  [facility] [address] [place], [place], [address] Phone: [phone]   Fax:  [phone]   Name: Heather [name] MRN: [id number] Date of Birth: 1945-06-25

## 2015-06-27 NOTE — Telephone Encounter (Signed)
Called pt and let her know that rx for Gabapentin 100 mg TID was e-scribed to her requested pharmacy on Fri, May 26th. Questions answered about medication including that it is given for neuropathy pain. She verbalized understanding and agreed to contact her pharmacy when refill is needed.

## 2015-06-27 NOTE — Telephone Encounter (Signed)
Patient came into the office stating that her Neurontin was never increased at her pharmacy. She believes it was to be increased to 300 mg. [facility]  Her best call back is [phone]

## 2015-06-27 NOTE — Patient Instructions (Signed)
Remember to overpronounce when you talk.

## 2015-06-28 ENCOUNTER — Ambulatory Visit: Payer: Medicare Other

## 2015-06-28 ENCOUNTER — Telehealth: Payer: Self-pay

## 2015-06-28 DIAGNOSIS — R471 Dysarthria and anarthria: Secondary | ICD-10-CM

## 2015-06-28 DIAGNOSIS — R1312 Dysphagia, oropharyngeal phase: Secondary | ICD-10-CM | POA: Diagnosis not present

## 2015-06-28 DIAGNOSIS — R1310 Dysphagia, unspecified: Secondary | ICD-10-CM

## 2015-06-28 NOTE — Patient Instructions (Signed)
Try and talk like you did before your your surgery, with everyone you can.

## 2015-06-28 NOTE — Telephone Encounter (Signed)
Patient is on the list for [employer] 2017 and may be a good candidate for an AWV in 2017. Due around July.

## 2015-06-28 NOTE — Therapy (Signed)
[facility] [address] [place], [place], [address] Phone: [phone]   Fax:  [phone]  Speech Language Pathology Treatment  Patient Details  Name: Heather [name] MRN: [id number] Date of Birth: 01/03/1946 Referring Provider: Dr. Leonides SakeBrian Chen  Encounter Date: 06/28/2015      End of Session - 06/28/15 1038    Visit Number 7   Number of Visits 17   Date for SLP Re-Evaluation 08/03/15   SLP Start Time 0933   SLP Stop Time  1011   SLP Time Calculation (min) 38 min      Past Medical History  Diagnosis Date  . MVP (mitral valve prolapse)     echo 1986-mild  . Hypertension   . Depression   . Anxiety   . 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 Wears glasses   . HOH (hard of hearing)     left  . Chicken pox   . Shortness of breath dyspnea     WITH EXERTION    . Heart murmur     MVP  . History of blood transfusion     1975 - leg fracture  . Headache 06/23/2015    Past Surgical History  Procedure Laterality Date  . Tubal ligation  1995  . Tibia fracture surgery Right 1975    rt  . Nasal sinus surgery  1975  . Plantar fascia release  1968    both feet  . Tonsillectomy  1951  . Carpal tunnel release Right 03/31/2013    Procedure: RIGHT CARPAL TUNNEL RELEASE;  Surgeon: Nicki ReaperGary R Kuzma, MD;  Location: [facility];  Service: Orthopedics;  Laterality: Right;  . Rotator cuff repair Right 1998  . Rotator cuff repair Right   . Endarterectomy Right 05/03/2015    Procedure: RIGHT CAROTID ENDARTERECTOMY ;  Surgeon: Fransisco HertzBrian L Chen, MD;  [facility];  Service: Vascular;  Laterality: Right;    There were no vitals filed for this visit.      Subjective Assessment - 06/27/15 1105    Subjective Pt saw neurologist last week, who thinks pt has stretched nerves that will resolve in several weeks-2 months.                ADULT SLP TREATMENT - 06/28/15 0943    General Information   Behavior/Cognition Alert;Cooperative;Pleasant mood   Dysphagia Treatment   Temperature Spikes Noted No   Treatment Methods Therapeutic exercise;Compensation strategy training   Other treatment/comments Pt req'd SBA (min cues once) for dysphagia HEP, otherwise used modified independence (looked at sheet)..    Cognitive-Linquistic Treatment   Treatment focused on Dysarthria   Skilled Treatment Pt spoke with SLP outside ST room for 7 minutes with WNL articulation. Pt told SLP that the speech was WNL for her pre-surgery.    Assessment / Recommendations / Plan   Plan Continue with current plan of care   Progression Toward Goals   Progression toward goals Progressing toward goals            SLP Short Term Goals - 06/28/15 1048    SLP SHORT TERM GOAL #1   Title Pt will follow HEP for dysphagia/dysarthria with rare min A   Status Achieved   SLP SHORT TERM GOAL #2   Title Pt will follow swallow precautions  with mod I   Status Achieved   SLP SHORT TERM GOAL #3  Title Pt will utilize compensations for dysarthria during simple conversation over 12 minutes.    Time 2   Period Weeks   Status On-going          SLP Long Term Goals - 06/28/15 1048    SLP LONG TERM GOAL #1   Title Pt will follow dysphagia precautions with mod I over three sessions   Time 6   Period Weeks   Status Revised   SLP LONG TERM GOAL #2   Title Pt will perform dysphagia and dysarthria HEP with mod I over three sessions   Baseline two sessions 06-28-15   Time 6   Period Weeks   Status Revised   SLP LONG TERM GOAL #3   Title Pt will report less than 2 requests for communication repair/repeat her message a day over 3 sessions.    Time 6   Period Weeks   Status On-going          Plan - 06/28/15 1041    Clinical Impression Statement Pt's dysphagia and dysarthria persist to today, although dysarthria is resolving moreso  with compensations. Cont'd recommend skilled ST to maximize intelliblity and safety of swallow is recommended. Pt may decr to once a week in next 1-2 weeks due to progress.   Speech Therapy Frequency 2x / week   Duration --  6 weeks   Treatment/Interventions Aspiration precaution training;Pharyngeal strengthening exercises;Diet toleration management by SLP;Compensatory techniques;Internal/external aids;Oral motor exercises;Functional tasks;Patient/family education   Potential to Achieve Goals Good      Patient will benefit from skilled therapeutic intervention in order to improve the following deficits and impairments:   Dysarthria and anarthria  Dysphagia    Problem List Patient Active Problem List   Diagnosis Date Noted  . 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 Headache 06/23/2015  . 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 Carotid artery stenosis 05/03/2015  . Carotid disease, bilateral (HCC) 03/03/2015  . Chronic back pain 03/03/2014  . Seasonal allergies 03/03/2014  . Genital herpes 03/03/2014  . Hyperlipidemia 03/03/2013  . Prediabetes 03/03/2013  . Anxiety and depression 12/11/2011  . Essential hypertension 11/19/2006  . MITRAL VALVE PROLAPSE 11/19/2006    [facility] ,MS, CCC-SLP  06/28/2015, 10:50 AM  [facility] [address] [place], [place], [address] Phone: [phone]   Fax:  [phone]   Name: Heather [name] MRN: [id number] Date of Birth: 03/04/1945

## 2015-07-04 ENCOUNTER — Ambulatory Visit: Payer: Medicare Other | Attending: Vascular Surgery | Admitting: Speech Pathology

## 2015-07-04 DIAGNOSIS — R1310 Dysphagia, unspecified: Secondary | ICD-10-CM | POA: Insufficient documentation

## 2015-07-04 DIAGNOSIS — R471 Dysarthria and anarthria: Secondary | ICD-10-CM | POA: Insufficient documentation

## 2015-07-04 NOTE — Telephone Encounter (Signed)
Scheduled AWV with PCP.

## 2015-07-04 NOTE — Therapy (Signed)
[facility] [address] [place], [place], [address] Phone: [phone]   Fax:  [phone]  Speech Language Pathology Treatment  Patient Details  Name: Heather [name] MRN: [id number] Date of Birth: 24-Nov-1945 Referring Provider: Dr. Leonides Sake  Encounter Date: 07/04/2015      End of Session - 07/04/15 1124    Visit Number 8   Number of Visits 17   SLP Start Time 0932   SLP Stop Time  1016   SLP Time Calculation (min) 44 min      Past Medical History  Diagnosis Date  . MVP (mitral valve prolapse)     echo 1986-mild  . Hypertension   . Depression   . Anxiety   . 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 Wears glasses   . HOH (hard of hearing)     left  . Chicken pox   . Shortness of breath dyspnea     WITH EXERTION    . Heart murmur     MVP  . History of blood transfusion     1975 - leg fracture  . Headache 06/23/2015    Past Surgical History  Procedure Laterality Date  . Tubal ligation  1995  . Tibia fracture surgery Right 1975    rt  . Nasal sinus surgery  1975  . Plantar fascia release  1968    both feet  . Tonsillectomy  1951  . Carpal tunnel release Right 03/31/2013    Procedure: RIGHT CARPAL TUNNEL RELEASE;  Surgeon: Nicki Reaper, MD;  Location: [facility];  Service: Orthopedics;  Laterality: Right;  . Rotator cuff repair Right 1998  . Rotator cuff repair Right   . Endarterectomy Right 05/03/2015    Procedure: RIGHT CAROTID ENDARTERECTOMY ;  Surgeon: Fransisco Hertz, MD;  Location[facility] OR;  Service: Vascular;  Laterality: Right;    There were no vitals filed for this visit.      Subjective Assessment - 07/04/15 0940    Subjective "I think my feeling is coming back because my scar is real tender"               ADULT SLP TREATMENT - 07/04/15 0940    General Information   Behavior/Cognition Alert;Cooperative;Pleasant mood   Treatment Provided   Treatment provided Dysphagia   Dysphagia Treatment   Temperature Spikes Noted No   Treatment Methods Therapeutic exercise;Compensation strategy training   Patient observed directly with PO's Yes   Type of PO's observed Thin liquids   Liquids provided via Cup   Other treatment/comments SBA for compensations,    Pain Assessment   Pain Assessment 0-10   Pain Score 3    Pain Location right neck scar   Pain Descriptors / Indicators Aching;Constant   Pain Intervention(s) Monitored during session   Cognitive-Linquistic Treatment   Treatment focused on Dysarthria   Skilled Treatment Pt reports that she feels her swallowing is better but she is still biting her lip. Mod I for dysphagia HEP. I added some lip ROM exercises to her dysarthria HEP as pt continues to verbalize displeasure with her lip droop. Pt performed new exercises with supervision cues. Pt 100% intellgible in conversation - she reports  Minimal (less than 2) requests to repeat herself outside of therapy.   Assessment / Recommendations / Plan   Plan Continue with current plan of care   Dysphagia Recommendations  Diet recommendations Dysphagia 3 (mechanical soft);Thin liquid   Progression Toward Goals   Progression toward goals Progressing toward goals          SLP Education - 07/04/15 1012    Education provided Yes   Education Details compensations for swallowing, reduce distractions when eating    Person(s) Educated Patient   Methods Explanation;Demonstration;Verbal cues;Handout          SLP Short Term Goals - 07/04/15 1123    SLP SHORT TERM GOAL #1   Title Pt will follow HEP for dysphagia/dysarthria with rare min A   Status Achieved   SLP SHORT TERM GOAL #2   Title Pt will follow swallow precautions  with mod I   Status Achieved   SLP SHORT TERM GOAL #3   Title Pt will utilize compensations for dysarthria during simple conversation over  12 minutes.    Time 2   Period Weeks   Status Achieved          SLP Long Term Goals - 07/04/15 1123    SLP LONG TERM GOAL #1   Title Pt will follow dysphagia precautions with mod I over three sessions   Time 5   Period Weeks   Status Revised   SLP LONG TERM GOAL #2   Title Pt will perform dysphagia and dysarthria HEP with mod I over three sessions   Baseline two sessions 06-28-15, 07/04/15   Time 5   Period Weeks   Status On-going   SLP LONG TERM GOAL #3   Title Pt will report less than 2 requests for communication repair/repeat her message a day over 3 sessions.    Baseline 07/04/15,   Time 6   Period Weeks   Status On-going          Plan - 07/04/15 1013    Clinical Impression Statement Pt's dysphagia and dysarthria persist to today, although dysarthria is resolving moreso with compensations. Cont'd recommend skilled ST to maximize intelliblity and safety of swallow is recommended. Decr to once a week today, for 1-3 more visits depending on progress.      Patient will benefit from skilled therapeutic intervention in order to improve the following deficits and impairments:   Dysarthria and anarthria  Dysphagia    Problem List Patient Active Problem List   Diagnosis Date Noted  . 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 Headache 06/23/2015  . 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 Carotid artery stenosis 05/03/2015  . Carotid disease, bilateral (HCC) 03/03/2015  . Chronic back pain 03/03/2014  . Seasonal allergies 03/03/2014  . Genital herpes 03/03/2014  . Hyperlipidemia 03/03/2013  . Prediabetes 03/03/2013  . Anxiety and depression 12/11/2011  . Essential hypertension 11/19/2006  . MITRAL VALVE PROLAPSE 11/19/2006    Zetha Kuhar, Radene JourneyLaura Ann MS, CCC-SLP 07/04/2015, 11:24 AM  [facility] [address] [place], [place], [address] Phone: [phone]   Fax:  [phone]   Name: Rebeca AlertMary J Serpe MRN: [id number] Date  of Birth: [birth year]

## 2015-07-04 NOTE — Patient Instructions (Signed)
   Lip Exercises - do in the mirror 20 times each 3x a day - make everything BIG!  Press lips together and pop them real big and loud  Press lips together and smile big with keeping your lips pressed together  Open your mouth real big, make an "OO" with your mouth open  Make a big pucker, move your pucker right to left   Fill your cheeks with air - big cheeks! - Swish the air right to left filling each cheek  Place small amount of spoon handle in weak side of your lip - hold it out straight for 10 seconds, 20 times   Be careful not to cheat and use your jaw, tongue, teeth, but instead focus on the weak lip area.

## 2015-07-06 ENCOUNTER — Ambulatory Visit (INDEPENDENT_AMBULATORY_CARE_PROVIDER_SITE_OTHER): Payer: Medicare Other | Admitting: Cardiovascular Disease

## 2015-07-06 ENCOUNTER — Encounter: Payer: Self-pay | Admitting: Cardiovascular Disease

## 2015-07-06 VITALS — BP 144/80 | HR 58 | Ht 65.0 in | Wt 156.0 lb

## 2015-07-06 DIAGNOSIS — I10 Essential (primary) hypertension: Secondary | ICD-10-CM | POA: Diagnosis not present

## 2015-07-06 DIAGNOSIS — E785 Hyperlipidemia, unspecified: Secondary | ICD-10-CM

## 2015-07-06 DIAGNOSIS — I6529 Occlusion and stenosis of unspecified carotid artery: Secondary | ICD-10-CM

## 2015-07-06 MED ORDER — LOSARTAN POTASSIUM 100 MG PO TABS: 100.0000 mg | ORAL_TABLET | Freq: Every day | ORAL | Status: DC

## 2015-07-06 MED ORDER — FLUCONAZOLE 150 MG PO TABS: 150.0000 mg | ORAL_TABLET | Freq: Every day | ORAL | Status: DC

## 2015-07-06 NOTE — Patient Instructions (Signed)
Medication Instructions:  INCREASE Losartan to 100 mg daily START Diflucan 150 mg daily x 14 days   Labwork: Your physician recommends that you return for lab work in: 3 weeks for basic metabolic panel   Testing/Procedures: None Ordered   Follow-Up: Your physician wants you to follow-up in: 6 months with Dr. Elease HashimotoNahser.  You will receive a reminder letter in the mail two months in advance. If you don't receive a letter, please call our office to schedule the follow-up appointment.   If you need a refill on your cardiac medications before your next appointment, please call your pharmacy.   Thank you for choosing [facility]! Eligha BridegroomMichelle Irianna Gilday, RN [phone]

## 2015-07-06 NOTE — Progress Notes (Signed)
Cardiology Office Note   Date:  07/06/2015   ID:  Heather ReichertMary J [name], DOB [birth year], MRN [id number]  PCP:  Nicki ReaperBAITY, REGINA, NP  Cardiologist:   Kristeen MissPhilip Johnwesley Lederman, MD   Chief Complaint  Patient presents with  . Follow-up    hyperlipidemia     Problem List 1. Mitral valve prolapse 2. Essential HTN 3 Carotid artery disease 4. Hyperlipidemia 2. Status post right carotid endarterectomy  History of Present Illness: Feb. 8, 2017 Heather [name] is a 70 y.o. female who presents for pre op evaluation prior to having carotid artery endarterectomy She has a history of mitral valve prolapse and has previously seen Dr. Aleen Campiysinger. She's not seen a cardiologist for years. She has had some tachypalpitations in the past.  She's had some hyperlipidemia. She also has hypertension. She does not take her BP at home but thinks todays elevated BP is more due to white coat HTN  She has had some exertional chest tightness -  Last for several minutes ( or until she stops walking )  Has been going on for several months   Eats lots of processed foods and fast foods.     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  She has a history of hyperlipidemia. We started atorvastatin 40 mg a day. Her last lipid levels look very good.  Past Medical History  Diagnosis Date  . MVP (mitral valve prolapse)     echo 1986-mild  . Hypertension   . Depression   . Anxiety   . 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 Wears glasses   . HOH (hard of hearing)     left  . Chicken pox   . Shortness of breath dyspnea     WITH EXERTION    . Heart murmur     MVP  . History of blood transfusion     1975 - leg fracture  . Headache 06/23/2015    Past Surgical  History  Procedure Laterality Date  . Tubal ligation  1995  . Tibia fracture surgery Right 1975    rt  . Nasal sinus surgery  1975  . Plantar fascia release  1968    both feet  . Tonsillectomy  1951  . Carpal tunnel release Right 03/31/2013    Procedure: RIGHT CARPAL TUNNEL RELEASE;  Surgeon: Nicki ReaperGary R Kuzma, MD;  Location: [facility];  Service: Orthopedics;  Laterality: Right;  . Rotator cuff repair Right 1998  . Rotator cuff repair Right   . Endarterectomy Right 05/03/2015    Procedure: RIGHT CAROTID ENDARTERECTOMY ;  Surgeon: Fransisco HertzBrian L Chen, MD;  [facility];  Service: Vascular;  Laterality: Right;     Current Outpatient Prescriptions  Medication Sig Dispense Refill  . acetaminophen (TYLENOL) 500 MG tablet Take 1,000 mg by mouth every 6 (six) hours as needed for moderate pain.    Marland Kitchen. ALPRAZolam (XANAX) 0.25 MG tablet TAKE 1 TABLET BY MOUTH DAILY AS NEEDED 30 tablet 0  . amoxicillin (AMOXIL) 500 MG capsule Take 1 capsule (500 mg total) by mouth 3 (three) times daily. 30 capsule 0  . Ascorbic Acid (VITAMIN C) 1000 MG tablet Take 1,000 mg by mouth daily.    .Marland Kitchen  aspirin EC 81 MG tablet Take 1 tablet (81 mg total) by mouth daily.    Marland Kitchen atorvastatin (LIPITOR) 40 MG tablet Take 1 tablet (40 mg total) by mouth daily. 30 tablet 11  . Cholecalciferol (VITAMIN D3) 1000 UNITS CAPS Take 1 capsule by mouth daily.     Marland Kitchen gabapentin (NEURONTIN) 100 MG capsule Take 1 capsule (100 mg total) by mouth 3 (three) times daily. 90 capsule 2  . loratadine (CLARITIN) 10 MG tablet Take 10 mg by mouth daily.      Marland Kitchen losartan (COZAAR) 50 MG tablet Take 1 tablet (50 mg total) by mouth daily. (Patient taking differently: Take 50 mg by mouth every evening. ) 30 tablet 11  . metoprolol tartrate (LOPRESSOR) 25 MG tablet Take 1 tablet (25 mg total) by mouth 2 (two) times daily. 180 tablet 3  . Multiple Vitamin (MULTIVITAMIN) tablet Take 1 tablet by mouth daily.      . sertraline (ZOLOFT) 50 MG tablet TAKE 2  TABLETS BY MOUTH EVERY DAY 180 tablet 2  . valACYclovir (VALTREX) 500 MG tablet TAKE 1 TABLET BY MOUTH DAILY CAN IN CREASE TO 2 FOR 5 DAYS IN THE EVENT OF RECURRENCE (Patient taking differently: TAKE 1 TABLET BY MOUTH DAILY CAN IN CREASE TO 2 FOR 5 DAYS IN THE EVENT OF RECURRENCE AS NEEDED) 30 tablet 12   No current facility-administered medications for this visit.    Allergies:   Sulfamethoxazole    Social History:  The patient  reports that she has never smoked. She has never used smokeless tobacco. She reports that she drinks about 1.8 oz of alcohol per week. She reports that she does not use illicit drugs.   Family History:  The patient's family history includes Arthritis in her mother; Cancer in her sister; Diabetes in her father; Heart attack in her father; Heart disease in her mother; Hyperlipidemia in her mother; Hypertension in her mother; Osteoporosis in her mother; Stroke in her mother. There is no history of Colon cancer. She was adopted.    ROS:  Please see the history of present illness.    Review of Systems: Constitutional:  denies fever, chills, diaphoresis, appetite change and fatigue.  HEENT: denies photophobia, eye pain, redness, hearing loss, ear pain, congestion, sore throat, rhinorrhea, sneezing, neck pain, neck stiffness and tinnitus.  Respiratory: denies SOB, DOE, cough, chest tightness, and wheezing.  Cardiovascular: denies chest pain, palpitations and leg swelling.  Gastrointestinal: denies nausea, vomiting, abdominal pain, diarrhea, constipation, blood in stool.  Genitourinary: denies dysuria, urgency, frequency, hematuria, flank pain and difficulty urinating.  Musculoskeletal: denies  myalgias, back pain, joint swelling, arthralgias and gait problem.   Skin: denies pallor, rash and wound.  Neurological: denies dizziness, seizures, syncope, weakness, light-headedness, numbness and headaches.   Hematological: denies adenopathy, easy bruising, personal or family  bleeding history.  Psychiatric/ Behavioral: denies suicidal ideation, mood changes, confusion, nervousness, sleep disturbance and agitation.       All other systems are reviewed and negative.    PHYSICAL EXAM: VS:  BP 144/80 mmHg  Pulse 58  Ht 5\' 5"  (1.651 m)  Wt 156 lb (70.761 kg)  BMI 25.96 kg/m2  SpO2 98%  LMP 12/29/2011 , BMI Body mass index is 25.96 kg/(m^2). GEN: Well nourished, well developed, in no acute distress HEENT:  + thrush on the back of her tongue Neck: no JVD,    Has a right CEA scar . Cardiac: RRR; no murmurs, rubs, or gallops,no edema  Respiratory:  clear to auscultation  bilaterally, normal work of breathing GI: soft, nontender, nondistended, + BS MS: no deformity or atrophy Skin: warm and dry, no rash Neuro:  Strength and sensation are intact, some weakness of her right side of her mouth  Psych: normal   EKG:  EKG is not ordered today. The ekg ordered general 31st, 2017  demonstrates sinus bradycardia at 57 beats a minute. She has no ST or T wave changes.   Recent Labs: 05/04/2015: Hemoglobin 9.8*; Platelets 195 06/05/2015: ALT 24; BUN 17; Creat 0.68; Potassium 4.3; Sodium 142    Lipid Panel    Component Value Date/Time   CHOL 162 06/05/2015 0825   TRIG 111 06/05/2015 0825   HDL 63 06/05/2015 0825   CHOLHDL 2.6 06/05/2015 0825   VLDL 22 06/05/2015 0825   LDLCALC 77 06/05/2015 0825   LDLDIRECT 132.0 02/21/2015 1331      Wt Readings from Last 3 Encounters:  07/06/15 156 lb (70.761 kg)  06/23/15 154 lb 4 oz (69.967 kg)  06/16/15 155 lb (70.308 kg)      Other studies Reviewed: Additional studies/ records that were reviewed today include: . Review of the above records demonstrates:    ASSESSMENT AND PLAN:  1.  Carotid artery disease - a Lexiscan myoview performed before her CEA surgery revealed no ischemia and normal LV function - EF  58%.   No CP   We'll add aspirin 81 mg a day.  2.Essential hypertension - BP is better.  Increase  Losartan to 100 mg a day    3. Hyperlipidemia -  on atorvastatin 40 mg a day. Total cholesterol is 162 now. Her triglyceride level is 111. HDL is 63. LDL was 77.  Continue current dose of atorvastatin.  4. Thrush:   She has been on lots of antibiotics and has developed Thrush.   Still has it in her upper throat and  Base of tongue. Tried nystatin swish and swallow but the thrush has remained.    Will give a round  of Diflucan  150 mg a day for 14 days .    Current medicines are reviewed at length with the patient today.  The patient does not have concerns regarding medicines.  The following changes have been made:  no change  Labs/ tests ordered today include:  No orders of the defined types were placed in this encounter.     Disposition:   FU with me in 6  months    Kristeen Miss, MD  07/06/2015 12:12 PM    [facility] [address] [place], [place], [place]  [address] Phone: [phone]; Fax: [phone]

## 2015-07-10 ENCOUNTER — Encounter: Payer: Self-pay | Admitting: Vascular Surgery

## 2015-07-11 ENCOUNTER — Other Ambulatory Visit: Payer: Self-pay | Admitting: Internal Medicine

## 2015-07-12 ENCOUNTER — Ambulatory Visit: Payer: Medicare Other

## 2015-07-12 DIAGNOSIS — R471 Dysarthria and anarthria: Secondary | ICD-10-CM | POA: Diagnosis not present

## 2015-07-12 DIAGNOSIS — R1310 Dysphagia, unspecified: Secondary | ICD-10-CM

## 2015-07-12 NOTE — Therapy (Signed)
[facility] [address] [place], [place], [address] Phone: [phone]   Fax:  [phone]  Speech Language Pathology Treatment  Patient Details  Name: Heather [name] MRN: [id number] Date of Birth: Jun 14, 1945 Referring Provider: Dr. Leonides SakeBrian Chen  Encounter Date: 07/12/2015      End of Session - 07/12/15 1016    Visit Number 9   Number of Visits 17   Date for SLP Re-Evaluation 08/03/15   SLP Start Time 0933   SLP Stop Time  1016   SLP Time Calculation (min) 43 min   Activity Tolerance Patient tolerated treatment well      Past Medical History  Diagnosis Date  . MVP (mitral valve prolapse)     echo 1986-mild  . Hypertension   . Depression   . Anxiety   . 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 Wears glasses   . HOH (hard of hearing)     left  . Chicken pox   . Shortness of breath dyspnea     WITH EXERTION    . Heart murmur     MVP  . History of blood transfusion     1975 - leg fracture  . Headache 06/23/2015    Past Surgical History  Procedure Laterality Date  . Tubal ligation  1995  . Tibia fracture surgery Right 1975    rt  . Nasal sinus surgery  1975  . Plantar fascia release  1968    both feet  . Tonsillectomy  1951  . Carpal tunnel release Right 03/31/2013    Procedure: RIGHT CARPAL TUNNEL RELEASE;  Surgeon: Nicki ReaperGary R Kuzma, MD;  Location: [facility];  Service: Orthopedics;  Laterality: Right;  . Rotator cuff repair Right 1998  . Rotator cuff repair Right   . Endarterectomy Right 05/03/2015    Procedure: RIGHT CAROTID ENDARTERECTOMY ;  Surgeon: Fransisco HertzBrian L Chen, MD;  [facility];  Service: Vascular;  Laterality: Right;    There were no vitals filed for this visit.      Subjective Assessment - 07/12/15 0939    Subjective "I think it's improving."    Currently in Pain? Yes   Pain Score 4     Pain Location Neck   Pain Orientation Right   Pain Descriptors / Indicators Aching   Pain Type Surgical pain   Pain Onset More than a month ago   Pain Frequency Constant               ADULT SLP TREATMENT - 07/12/15 1004    General Information   Behavior/Cognition Alert;Cooperative;Pleasant mood   Treatment Provided   Treatment provided Dysphagia   Dysphagia Treatment   Temperature Spikes Noted No   Treatment Methods Therapeutic exercise;Compensation strategy training   Patient observed directly with PO's Yes   Type of PO's observed Thin liquids   Other treatment/comments Independent with precautions. Pt performed her dysphagia HEP with SBA. SLP encouraged a mirror with [place] and GaGa Ga exercises.   Cognitive-Linquistic Treatment   Treatment focused on Dysarthria   Skilled Treatment Pt 100% intelligible; pt cont to bite her lip at least once a meal. She performed her lip exercises with SBA, and dysarthria exercises with SBA.    Assessment / Recommendations / Plan   Plan Continue with current plan of care   Dysphagia Recommendations   Diet recommendations Dysphagia 3 (mechanical soft);Regular;Thin liquid  as tolerated   Compensations Effortful swallow;Clear throat after each swallow   Postural Changes and/or Swallow Maneuvers Chin tuck   Progression Toward Goals   Progression toward goals Progressing toward goals            SLP Short Term Goals - 07/12/15 1019    SLP SHORT TERM GOAL #1   Title Pt will follow HEP for dysphagia/dysarthria with rare min A   Status Achieved   SLP SHORT TERM GOAL #2   Title Pt will follow swallow precautions  with mod I   Status Achieved   SLP SHORT TERM GOAL #3   Title Pt will utilize compensations for dysarthria during simple conversation over 12 minutes.    Time --   Period --   Status Achieved          SLP Long Term Goals - 07/12/15 0956    SLP LONG TERM GOAL #1   Title Pt will follow dysphagia precautions with mod I  over three sessions   Baseline two sessions 07-12-15   Time 4   Period Weeks   Status Revised   SLP LONG TERM GOAL #2   Title Pt will perform dysphagia and dysarthria HEP with mod I over three sessions   Baseline --   Time --   Period --   Status Achieved   SLP LONG TERM GOAL #3   Title Pt will report less than 2 requests for communication repair/repeat her message a day over 3 sessions.    Baseline 07/04/15, 07-12-15   Time 4   Period Weeks   Status On-going          Plan - 07/12/15 1016    Clinical Impression Statement Pt's dysarthria is resolving in converssation, but pt still demonstrates mild rt labial droop and reduced rt labial ROM if not focused on rt labial movement. She demo'd SBA with dysarthria and dysphagia HEPs today. Cont'd recommend skilled ST to maximize intelliblity and safety of swallow is recommended. Decr to once a week is warranted and pt agrees.   Duration --  5 weeks   Treatment/Interventions Aspiration precaution training;Pharyngeal strengthening exercises;Diet toleration management by SLP;Compensatory techniques;Internal/external aids;Oral motor exercises;Functional tasks;Patient/family education   Potential to Achieve Goals Good   Consulted and Agree with Plan of Care Patient      Patient will benefit from skilled therapeutic intervention in order to improve the following deficits and impairments:   Dysarthria and anarthria  Dysphagia    Problem List Patient Active Problem List   Diagnosis Date Noted  . 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 Headache 06/23/2015  . 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 Carotid artery stenosis 05/03/2015  . Carotid disease, bilateral (HCC) 03/03/2015  . Chronic back pain 03/03/2014  . Seasonal allergies 03/03/2014  . Genital herpes 03/03/2014  . Hyperlipidemia 03/03/2013  . Prediabetes 03/03/2013  . Anxiety and depression 12/11/2011  . Essential hypertension 11/19/2006  . MITRAL VALVE PROLAPSE 11/19/2006     [facility] ,MS, CCC-SLP  07/12/2015, 10:22 AM  [facility] [address] [place], [place], [address] Phone: [phone]   Fax:  [phone]   Name: Heather [name] MRN: [id number] Date of Birth: 04/09/1945

## 2015-07-12 NOTE — Telephone Encounter (Signed)
Ok to phone in Xanax 

## 2015-07-12 NOTE — Telephone Encounter (Signed)
Xanax #[id number] last filled 06/09/15. Pt's last ov was a f/u on 02/21/15. Next appt scheduled is a MCR wellness on 08/14/15.

## 2015-07-12 NOTE — Telephone Encounter (Signed)
Rx called in to pharmacy. 

## 2015-07-14 ENCOUNTER — Ambulatory Visit (INDEPENDENT_AMBULATORY_CARE_PROVIDER_SITE_OTHER): Payer: Medicare Other | Admitting: Vascular Surgery

## 2015-07-14 ENCOUNTER — Encounter: Payer: Self-pay | Admitting: Vascular Surgery

## 2015-07-14 VITALS — BP 136/58 | HR 65 | Temp 97.9°F | Resp 16 | Ht 65.0 in | Wt 152.0 lb

## 2015-07-14 DIAGNOSIS — I779 Disorder of arteries and arterioles, unspecified: Secondary | ICD-10-CM

## 2015-07-14 DIAGNOSIS — I739 Peripheral vascular disease, unspecified: Principal | ICD-10-CM

## 2015-07-14 NOTE — Progress Notes (Signed)
Postoperative Visit   History of Present Illness  Heather [name] is a 70 y.o. (Nov 27, 1945) female who presents for postoperative follow-up for: R CEA (Date: 05/03/15). The patient's neck incision is healed. The patient has not had any further CVA or TIA sx.  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  She was sent to Neurology who confirms this diagnosis.  They also felt she might have a sympathetic headache related to the CEA.  This has improved.  She continues with ST: her swallowing is: improved.  Her oral thrush was treated with Nystatin, but this was inadequate, so she was given Dilfucan by cardiology.  This is also improved.  Pt notes some difficulty with word finding.  This has: improved also  For VQI Use Only  PRE-ADM LIVING: Home  AMB STATUS: Ambulatory  Current Outpatient Prescriptions  Medication Sig Dispense Refill  . acetaminophen (TYLENOL) 500 MG tablet Take 1,000 mg by mouth every 6 (six) hours as needed for moderate pain.    Marland Kitchen ALPRAZolam (XANAX) 0.25 MG tablet TAKE 1 TABLET BY MOUTH ONCE DAILY AS NEEDED 30 tablet 0  . amoxicillin (AMOXIL) 500 MG capsule Take 1 capsule (500 mg total) by mouth 3 (three) times daily. 30 capsule 0  . Ascorbic Acid (VITAMIN C) 1000 MG tablet Take 1,000 mg by mouth daily.    Marland Kitchen aspirin EC 81 MG tablet Take 1 tablet (81 mg total) by mouth daily.    Marland Kitchen atorvastatin (LIPITOR) 40 MG tablet Take 1 tablet (40 mg total) by mouth daily. 30 tablet 11  . Cholecalciferol (VITAMIN D3) 1000 UNITS CAPS Take 1 capsule by mouth daily.     . fluconazole (DIFLUCAN) 150 MG tablet Take 1 tablet (150 mg total) by mouth daily. For 14 days 14 tablet 0  . gabapentin (NEURONTIN) 100 MG capsule Take 1 capsule (100 mg total) by mouth 3 (three) times daily. 90 capsule 2  . loratadine (CLARITIN) 10 MG tablet Take 10 mg by mouth daily.      Marland Kitchen losartan (COZAAR) 100 MG tablet Take 1 tablet (100 mg  total) by mouth daily. 30 tablet 11  . metoprolol tartrate (LOPRESSOR) 25 MG tablet Take 1 tablet (25 mg total) by mouth 2 (two) times daily. 180 tablet 3  . Multiple Vitamin (MULTIVITAMIN) tablet Take 1 tablet by mouth daily.      . sertraline (ZOLOFT) 50 MG tablet TAKE 2 TABLETS BY MOUTH EVERY DAY 180 tablet 2  . valACYclovir (VALTREX) 500 MG tablet TAKE 1 TABLET BY MOUTH DAILY CAN IN CREASE TO 2 FOR 5 DAYS IN THE EVENT OF RECURRENCE (Patient taking differently: TAKE 1 TABLET BY MOUTH DAILY CAN IN CREASE TO 2 FOR 5 DAYS IN THE EVENT OF RECURRENCE AS NEEDED) 30 tablet 12   No current facility-administered medications for this visit.    Physical Examination  Filed Vitals:   07/14/15 1211 07/14/15 1214  BP: 134/54 136/58  Pulse: 65 65  Temp: 97.9 F (36.6 C)   Resp: 16   Height:  (1.651 m)   Weight: 152 lb (68.947 kg)   SpO2: 98%     Mouth: sym smile, R angle of mouth slight droop   R Neck: Incision is healed  Neuro: CN 2-12 are intact, Motor strength is 5/5 bilaterally, sensation is grossly intact   Medical Decision Making  Heather [name] is a 70 y.o. 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  Her sympathic headaches are improved.  Her swallowing is improved.   Her thrush is resolving.  Will have her come back in 3 month for check up on her recovery.  Leonides SakeBrian Chen, MD [facility] Office: [phone] Pager: [phone]

## 2015-07-17 ENCOUNTER — Ambulatory Visit: Payer: Medicare Other | Admitting: Speech Pathology

## 2015-07-17 DIAGNOSIS — R471 Dysarthria and anarthria: Secondary | ICD-10-CM

## 2015-07-17 DIAGNOSIS — R1310 Dysphagia, unspecified: Secondary | ICD-10-CM

## 2015-07-17 NOTE — Patient Instructions (Signed)
  Continue speech and swallowing exercises  Continue chin tuck, effortful swallow with all foods/drinks

## 2015-07-17 NOTE — Therapy (Signed)
[facility] [address] [place], [place], [address] Phone: [phone]   Fax:  [phone]  Speech Language Pathology Treatment  Patient Details  Name: Heather [name] MRN: [id number] Date of Birth: 08-04-1945 Referring Provider: Dr. Adele Barthel  Encounter Date: 07/17/2015      End of Session - 07/17/15 1011    SLP Start Time 0934   SLP Stop Time  1017   SLP Time Calculation (min) 43 min      Past Medical History  Diagnosis Date  . MVP (mitral valve prolapse)     echo 1986-mild  . Hypertension   . Depression   . Anxiety   . 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 Wears glasses   . HOH (hard of hearing)     left  . Chicken pox   . Shortness of breath dyspnea     WITH EXERTION    . Heart murmur     MVP  . History of blood transfusion     1975 - leg fracture  . Headache 06/23/2015    Past Surgical History  Procedure Laterality Date  . Tubal ligation  1995  . Tibia fracture surgery Right 1975    rt  . Nasal sinus surgery  1975  . Plantar fascia release  1968    both feet  . Tonsillectomy  1951  . Carpal tunnel release Right 03/31/2013    Procedure: RIGHT CARPAL TUNNEL RELEASE;  Surgeon: Wynonia Sours, MD;  Location: [facility];  Service: Orthopedics;  Laterality: Right;  . Rotator cuff repair Right 1998  . Rotator cuff repair Right   . Endarterectomy Right 05/03/2015    Procedure: RIGHT CAROTID ENDARTERECTOMY ;  Surgeon: Conrad Wimbledon, MD;  Location: [facility];  Service: Vascular;  Laterality: Right;    There were no vitals filed for this visit.      Subjective Assessment - 07/17/15 0942    Subjective "I got the Diflucan that you mentioned"               ADULT SLP TREATMENT - 07/17/15 0942    General Information   Behavior/Cognition Alert;Cooperative;Pleasant mood   Treatment Provided   Treatment provided --   Dysphagia Treatment   Treatment Methods Therapeutic exercise;Compensation strategy training   Patient observed directly with PO's Yes   Type of PO's observed Thin liquids   Liquids provided via Cup   Other treatment/comments Dysphagia HEP with supervision cues to mod I.    Pain Assessment   Pain Assessment No/denies pain   Cognitive-Linquistic Treatment   Treatment focused on Dysarthria   Skilled Treatment Pt intellgible at conversation level with slight slur. Pt performed oral motor exercises with mod I. Pt reports she is reducing amount of talking and not talking in long conversations.   Assessment / Recommendations / Plan   Plan Discharge SLP treatment due to (comment)   Dysphagia Recommendations   Diet recommendations Dysphagia 3 (mechanical soft)   Compensations Effortful swallow;Clear throat after each swallow   Postural Changes and/or Swallow Maneuvers Chin tuck   Progression Toward Goals   Progression toward goals Goals met, education completed, patient discharged from [facility] - 07/17/15 1003    Education provided Yes   Education Details continue HEP's and swallow precautions   Person(s) Educated Patient   Methods Explanation;Demonstration  Comprehension Verbalized understanding;Returned demonstration     SPEECH THERAPY DISCHARGE SUMMARY  Visits from Start of Care: 10  Current functional level related to goals / functional outcomes: See goals below   Remaining deficits: Slight slur of speech, oral pharyngeal dysphagia   Education / Equipment: HEP for dysarthria and dysphagia; compensations for dysarthria, swallowing precautions Plan: Patient agrees to discharge.  Patient goals were not met. Patient is being discharged due to meeting the stated rehab goals.  ?????           SLP Short Term Goals - 2015/08/02 1007    SLP SHORT TERM GOAL #1   Title Pt will follow HEP for dysphagia/dysarthria with rare min A   Status  Achieved   SLP SHORT TERM GOAL #2   Title Pt will follow swallow precautions  with mod I   Status Achieved   SLP SHORT TERM GOAL #3   Title Pt will utilize compensations for dysarthria during simple conversation over 12 minutes.    Status Achieved          SLP Long Term Goals - August 02, 2015 1007    SLP LONG TERM GOAL #1   Title Pt will follow dysphagia precautions with mod I over three sessions   Baseline two sessions 07-12-15, 08/02/2015   Time 4   Period Weeks   Status Achieved   SLP LONG TERM GOAL #2   Title Pt will perform dysphagia and dysarthria HEP with mod I over three sessions   Status Achieved   SLP LONG TERM GOAL #3   Title Pt will report less than 2 requests for communication repair/repeat her message a day over 3 sessions.    Baseline 07/04/15, 07-12-15, 08-02-2015   Time 4   Period Weeks   Status Achieved          Plan - 08-02-15 1004    Clinical Impression Statement Pt mod I to supervision cues for dysphagia and dysarthria HEP. Conversation is 100% intellgibile, with slight slur noted. Recommend pt continue HEP and swallow precautions, however goals met and education completed - recommend pt be discharged from [facility] but cotinue exercises at home   Treatment/Interventions Aspiration precaution training;Pharyngeal strengthening exercises;Diet toleration management by SLP;Compensatory techniques;Internal/external aids;Oral motor exercises;Functional tasks;Patient/family education   Potential to Achieve Goals Good   Consulted and Agree with Plan of Care Patient      Patient will benefit from skilled therapeutic intervention in order to improve the following deficits and impairments:   Dysarthria and anarthria  Dysphagia      G-Codes - 08-02-2015 1008    Functional Assessment Tool Used NOMS   Functional Limitations Swallowing   Swallow Goal Status (T6256) At least 1 percent but less than 20 percent impaired, limited or restricted   Swallow Discharge Status [phone]) At least  1 percent but less than 20 percent impaired, limited or restricted      Problem List Patient Active Problem List   Diagnosis Date Noted  . 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 Headache 06/23/2015  . 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 Carotid artery stenosis 05/03/2015  . Carotid disease, bilateral ([place]) 03/03/2015  . Chronic back pain 03/03/2014  . Seasonal allergies 03/03/2014  . Genital herpes 03/03/2014  . Hyperlipidemia 03/03/2013  . Prediabetes 03/03/2013  . Anxiety and depression 12/11/2011  . Essential hypertension 11/19/2006  . MITRAL VALVE PROLAPSE 11/19/2006    Rhylynn Perdomo, Annye Rusk MS, CCC-SLP 2015-08-02, 10:17 AM  [facility] [address] [place],  [place], [address] Phone: [phone]   Fax:  [phone]   Name: Heather [name] MRN: [id number] Date of Birth: [birth year]

## 2015-07-20 ENCOUNTER — Encounter: Payer: Medicare Other | Admitting: Speech Pathology

## 2015-07-27 ENCOUNTER — Other Ambulatory Visit (INDEPENDENT_AMBULATORY_CARE_PROVIDER_SITE_OTHER): Payer: Medicare Other | Admitting: *Deleted

## 2015-07-27 DIAGNOSIS — I10 Essential (primary) hypertension: Secondary | ICD-10-CM

## 2015-07-27 LAB — BASIC METABOLIC PANEL
BUN: 20 mg/dL (ref 7–25)
CALCIUM: 9.6 mg/dL (ref 8.6–10.4)
CO2: 28 mmol/L (ref 20–31)
CREATININE: 0.84 mg/dL (ref 0.60–0.93)
Chloride: 104 mmol/L (ref 98–110)
GLUCOSE: 104 mg/dL — AB (ref 65–99)
Potassium: 4.8 mmol/L (ref 3.5–5.3)
SODIUM: 140 mmol/L (ref 135–146)

## 2015-07-27 NOTE — Addendum Note (Signed)
Addended by: Tonita PhoenixBOWDEN, Decoda Van K on: 07/27/2015 10:16 AM   Modules accepted: Orders

## 2015-08-07 ENCOUNTER — Other Ambulatory Visit: Payer: Self-pay | Admitting: Internal Medicine

## 2015-08-08 NOTE — Telephone Encounter (Signed)
Last filled 07/12/15--pt has mcr wellness exam scheduled for next week---UDS needed will get at [facility] if you are okay with that---please advise

## 2015-08-08 NOTE — Telephone Encounter (Signed)
Ok to phone in Xanax, will get UDS at [facility] next week

## 2015-08-08 NOTE — Telephone Encounter (Signed)
Rx called in to pharmacy. 

## 2015-08-14 ENCOUNTER — Ambulatory Visit (INDEPENDENT_AMBULATORY_CARE_PROVIDER_SITE_OTHER): Payer: Medicare Other | Admitting: Internal Medicine

## 2015-08-14 ENCOUNTER — Encounter: Payer: Self-pay | Admitting: Internal Medicine

## 2015-08-14 VITALS — BP 136/68 | HR 82 | Temp 98.9°F | Ht 65.0 in | Wt 156.5 lb

## 2015-08-14 DIAGNOSIS — I779 Disorder of arteries and arterioles, unspecified: Secondary | ICD-10-CM

## 2015-08-14 DIAGNOSIS — F418 Other specified anxiety disorders: Secondary | ICD-10-CM

## 2015-08-14 DIAGNOSIS — J302 Other seasonal allergic rhinitis: Secondary | ICD-10-CM | POA: Diagnosis not present

## 2015-08-14 DIAGNOSIS — A6000 Herpesviral infection of urogenital system, unspecified: Secondary | ICD-10-CM

## 2015-08-14 DIAGNOSIS — I10 Essential (primary) hypertension: Secondary | ICD-10-CM

## 2015-08-14 DIAGNOSIS — Z0001 Encounter for general adult medical examination with abnormal findings: Secondary | ICD-10-CM

## 2015-08-14 DIAGNOSIS — Z23 Encounter for immunization: Secondary | ICD-10-CM | POA: Diagnosis not present

## 2015-08-14 DIAGNOSIS — R7303 Prediabetes: Secondary | ICD-10-CM

## 2015-08-14 DIAGNOSIS — M549 Dorsalgia, unspecified: Secondary | ICD-10-CM

## 2015-08-14 DIAGNOSIS — Z1159 Encounter for screening for other viral diseases: Secondary | ICD-10-CM | POA: Diagnosis not present

## 2015-08-14 DIAGNOSIS — G8929 Other chronic pain: Secondary | ICD-10-CM

## 2015-08-14 DIAGNOSIS — E785 Hyperlipidemia, unspecified: Secondary | ICD-10-CM

## 2015-08-14 DIAGNOSIS — F419 Anxiety disorder, unspecified: Secondary | ICD-10-CM

## 2015-08-14 DIAGNOSIS — R6889 Other general symptoms and signs: Secondary | ICD-10-CM | POA: Diagnosis not present

## 2015-08-14 DIAGNOSIS — I739 Peripheral vascular disease, unspecified: Secondary | ICD-10-CM

## 2015-08-14 DIAGNOSIS — G510 Bell's palsy: Secondary | ICD-10-CM

## 2015-08-14 DIAGNOSIS — F329 Major depressive disorder, single episode, unspecified: Secondary | ICD-10-CM

## 2015-08-14 DIAGNOSIS — G519 Disorder of facial nerve, unspecified: Secondary | ICD-10-CM

## 2015-08-14 NOTE — Assessment & Plan Note (Signed)
Controlled on Losartan and Toprol ECG reviewed CBC and CMET reviewed

## 2015-08-14 NOTE — Assessment & Plan Note (Signed)
LDL close to goal on Zocor Encouraged her to consume a low fat diet ASA daily

## 2015-08-14 NOTE — Addendum Note (Signed)
Addended by: Roena MaladyEVONTENNO, Mako Pelfrey Y on: 08/14/2015 11:59 AM   Modules accepted: Orders, SmartSet
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
## 2015-08-14 NOTE — Assessment & Plan Note (Signed)
No recent flare on Valtrex Will continue to monitor

## 2015-08-14 NOTE — Progress Notes (Signed)
Pre visit review using our clinic review tool, if applicable. No additional management support is needed unless otherwise documented below in the visit note. 

## 2015-08-14 NOTE — Assessment & Plan Note (Signed)
Last A1C 5.8%

## 2015-08-14 NOTE — Assessment & Plan Note (Signed)
Ongoing but not worse Continue Meloxicam, Neurontin and Tramadol

## 2015-08-14 NOTE — Patient Instructions (Signed)

## 2015-08-14 NOTE — Assessment & Plan Note (Signed)
Chronic but stable on Zoloft and Xanax UDS today

## 2015-08-14 NOTE — Assessment & Plan Note (Signed)
Continue Claritin daily Let me know if deteriorates

## 2015-08-14 NOTE — Assessment & Plan Note (Signed)
Continue Neurontin. 

## 2015-08-14 NOTE — Assessment & Plan Note (Signed)
S/p CEA on right Complicated by nerve damage Continue Neurontin Will follow carotid ultrasounds

## 2015-08-14 NOTE — Addendum Note (Signed)
Addended by: Roena MaladyEVONTENNO, Anesa Fronek Y on: 08/14/2015 11:54 AM   Modules accepted: Kipp BroodSmartSet

## 2015-08-15 ENCOUNTER — Telehealth: Payer: Self-pay | Admitting: Internal Medicine

## 2015-08-15 LAB — HEPATITIS C ANTIBODY: HCV AB: NEGATIVE

## 2015-08-15 NOTE — Telephone Encounter (Signed)
Agree with advise given.
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
## 2015-08-16 NOTE — Addendum Note (Signed)
Encounter addended by: Clearance CootsBonnie C Jerod Mcquain, CCC-SLP on: 08/16/2015 11:23 AM<BR>     Documentation filed: Clinical Notes, Inpatient Document Flowsheet

## 2015-08-16 NOTE — Progress Notes (Signed)
   05/25/15 1200  SLP G-Codes **NOT FOR INPATIENT CLASS**  Functional Assessment Tool Used (clinical judgement)  Functional Limitations Swallowing  Swallow Current Status (W0981(G8996) CJ  Swallow Goal Status (X9147(G8997) CJ  Swallow Discharge Status (W2956(G8998) CJ  SLP Evaluations  $ SLP Speech Visit 1 Procedure  SLP Evaluations  $Swallowing Treatment 1 Procedure  $MBS Swallow Outpatient 1 Procedure

## 2015-08-20 ENCOUNTER — Other Ambulatory Visit: Payer: Self-pay | Admitting: Internal Medicine

## 2015-08-24 ENCOUNTER — Other Ambulatory Visit: Payer: Self-pay | Admitting: Obstetrics & Gynecology

## 2015-09-05 ENCOUNTER — Other Ambulatory Visit: Payer: Self-pay | Admitting: Internal Medicine

## 2015-09-06 NOTE — Telephone Encounter (Signed)
Pt is aware that you will no longer fill controlled substance for her---pt still states she takes Xanax daily--pt wants to know if there is something daily she can take for anxiety--please advise

## 2015-09-06 NOTE — Telephone Encounter (Signed)
She can try Buspar 7.5 mg BID

## 2015-09-07 MED ORDER — BUSPIRONE HCL 7.5 MG PO TABS: 7.5000 mg | ORAL_TABLET | Freq: Two times a day (BID) | ORAL | 1 refills | Status: DC

## 2015-09-07 NOTE — Telephone Encounter (Signed)
Pt is agreeable to try trying Buspar---Rx sent to pharmacy---pt advised to contact office if she has any problems with starting--otherwise call to update in about 2-3 weeks

## 2015-09-07 NOTE — Addendum Note (Signed)
Addended by: Roena MaladyEVONTENNO, Keatin Benham Y on: 09/07/2015 05:10 PM   Modules accepted: Orders

## 2015-09-25 ENCOUNTER — Encounter: Payer: Self-pay | Admitting: Neurology

## 2015-09-25 ENCOUNTER — Ambulatory Visit (INDEPENDENT_AMBULATORY_CARE_PROVIDER_SITE_OTHER): Payer: Medicare Other | Admitting: Neurology

## 2015-09-25 VITALS — BP 135/55 | HR 56 | Ht 65.0 in | Wt 157.0 lb

## 2015-09-25 DIAGNOSIS — G8929 Other chronic pain: Secondary | ICD-10-CM | POA: Diagnosis not present

## 2015-09-25 DIAGNOSIS — G4489 Other headache syndrome: Secondary | ICD-10-CM | POA: Diagnosis not present

## 2015-09-25 DIAGNOSIS — G523 Disorders of hypoglossal nerve: Secondary | ICD-10-CM | POA: Diagnosis not present

## 2015-09-25 DIAGNOSIS — M549 Dorsalgia, unspecified: Secondary | ICD-10-CM | POA: Diagnosis not present

## 2015-09-25 DIAGNOSIS — G519 Disorder of facial nerve, unspecified: Secondary | ICD-10-CM

## 2015-09-25 DIAGNOSIS — G510 Bell's palsy: Secondary | ICD-10-CM | POA: Diagnosis not present

## 2015-09-25 MED ORDER — GABAPENTIN 100 MG PO CAPS: 200.0000 mg | ORAL_CAPSULE | Freq: Three times a day (TID) | ORAL | 3 refills | Status: DC

## 2015-09-25 NOTE — Patient Instructions (Signed)
   Neurontin (gabapentin) may result in drowsiness, ankle swelling, gait instability, or possibly dizziness. Please contact our office if significant side effects occur with this medication.  

## 2015-09-25 NOTE — Progress Notes (Signed)
Reason for visit: Hypoglossal and facial neuropathies  Heather [name] is an 70 y.o. female  History of present illness:  Ms. 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 The patient has gained some improvement in tongue and facial strength, but she is still not back to her usual baseline. The patient also complained of some problems with a temporal headache, the headache is on both sides, which also has a sensation of a crick in her neck on the right side. The patient denies any pain going down the arms. The patient reports some word finding problems at times, she has difficulty carrying on conversation. If she talks a lot there is a fatigue factor with the face and tongue. The patient may have occasional days without headache, but most days she does have some headache but the headache is not disabling. She has chronic low back pain as well. She has been placed on low-dose gabapentin taking 100 mg 3 times daily. This has helped some with the low back pain and with the headache. She is not too drowsy on the medication.  Past Medical History:  Diagnosis Date  . Anxiety   . Chicken pox   . Depression   . Headache 06/23/2015  . Heart murmur    MVP  . History of blood transfusion    1975 - leg fracture  . HOH (hard of hearing)    left  . Hypertension   . MVP (mitral valve prolapse)    echo 1986-mild  . 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 Shortness of breath dyspnea    WITH EXERTION    . Wears glasses     Past Surgical History:  Procedure Laterality Date  . CARPAL TUNNEL RELEASE Right 03/31/2013   Procedure: RIGHT CARPAL TUNNEL RELEASE;  Surgeon: Nicki Reaper, MD;  Location: [facility];  Service: Orthopedics;  Laterality:  Right;  . ENDARTERECTOMY Right 05/03/2015   Procedure: RIGHT CAROTID ENDARTERECTOMY ;  Surgeon: Fransisco Hertz, MD;  Location[facility];  Service: Vascular;  Laterality: Right;  . NASAL SINUS SURGERY  1975  . PLANTAR FASCIA RELEASE  1968   both feet  . ROTATOR CUFF REPAIR Right 1998  . ROTATOR CUFF REPAIR Right   . TIBIA FRACTURE SURGERY Right 1975   rt  . TONSILLECTOMY  1951  . TUBAL LIGATION  1995    Family History  Problem Relation Age of Onset  . Adopted: Yes  . Osteoporosis Mother   . Heart disease Mother   . Hypertension Mother   . Hyperlipidemia Mother   . Stroke Mother   . Arthritis Mother   . Diabetes Father   . Heart attack Father   . Colon cancer Neg Hx   . Cancer Sister     breast    Social history:  reports that she has never smoked. She has never used smokeless tobacco. She reports that she drinks about 1.8 oz of alcohol per week . She reports that she does not use drugs.    Allergies  Allergen Reactions  . Sulfamethoxazole Swelling    Unspecified area of swelling    Medications:  Prior to Admission medications   Medication Sig Start Date End Date Taking? Authorizing Provider  acetaminophen (TYLENOL) 500 MG tablet Take 1,000  mg by mouth every 6 (six) hours as needed for moderate pain.    Historical Provider, MD  Ascorbic Acid (VITAMIN C) 1000 MG tablet Take 1,000 mg by mouth daily.    Historical Provider, MD  aspirin EC 81 MG tablet Take 1 tablet (81 mg total) by mouth daily. 03/08/15   Vesta MixerPhilip J Nahser, MD  atorvastatin (LIPITOR) 40 MG tablet Take 1 tablet (40 mg total) by mouth daily. 03/08/15   Vesta MixerPhilip J Nahser, MD  busPIRone (BUSPAR) 7.5 MG tablet Take 1 tablet (7.5 mg total) by mouth 2 (two) times daily. 09/07/15   Lorre Munroeegina W Baity, NP  Cholecalciferol (VITAMIN D3) 1000 UNITS CAPS Take 1 capsule by mouth daily.     Historical Provider, MD  gabapentin (NEURONTIN) 100 MG capsule Take 1 capsule (100 mg total) by mouth 3 (three) times daily. 06/23/15   York Spanielharles K Deanglo Hissong,  MD  loratadine (CLARITIN) 10 MG tablet Take 10 mg by mouth daily.      Historical Provider, MD  losartan (COZAAR) 100 MG tablet Take 1 tablet (100 mg total) by mouth daily. 07/06/15   Vesta MixerPhilip J Nahser, MD  meloxicam (MOBIC) 15 MG tablet Take 1 tablet by mouth daily. 08/07/15   Historical Provider, MD  metoprolol tartrate (LOPRESSOR) 25 MG tablet Take 1 tablet (25 mg total) by mouth 2 (two) times daily. 05/18/15   Vesta MixerPhilip J Nahser, MD  Multiple Vitamin (MULTIVITAMIN) tablet Take 1 tablet by mouth daily.      Historical Provider, MD  sertraline (ZOLOFT) 50 MG tablet TAKE 2 TABLETS BY MOUTH EVERY DAY 08/21/15   Lorre Munroeegina W Baity, NP  valACYclovir (VALTREX) 500 MG tablet TAKE 1 TABLET BY MOUTH DAILY CAN IN CREASE TO 2 FOR 5 DAYS IN THE EVENT OF RECURRENCE Patient taking differently: TAKE 1 TABLET BY MOUTH DAILY CAN IN CREASE TO 2 FOR 5 DAYS IN THE EVENT OF RECURRENCE AS NEEDED 09/23/13   Tereso NewcomerUgonna A Anyanwu, MD  valACYclovir (VALTREX) 500 MG tablet TAKE 1 TABLET BY MOUTH DAILY**CAN INCREASE TO 2 TABS FOR 5 DAY IN THE EVENT OF RECURRENCE** 08/25/15   Tereso NewcomerUgonna A Anyanwu, MD    ROS:  Out of a complete 14 system review of symptoms, the patient complains only of the following symptoms, and all other reviewed systems are negative.  Environmental allergies Restless legs Back pain, muscle cramps  Last menstrual period 12/29/2011.  Physical Exam  General: The patient is alert and cooperative at the time of the examination.  Neuromuscular: The patient lacks about 15 of full lateral rotation of the cervical spine bilaterally.  Skin: No significant peripheral edema is noted.   Neurologic Exam  Mental status: The patient is alert and oriented x 3 at the time of the examination. The patient has apparent normal recent and remote memory, with an apparently normal attention span and concentration ability.   Cranial nerves: Facial symmetry is not present. With smiling, there is some decreased muscle contraction on the  right lower face. Speech is normal, no aphasia or dysarthria is noted. Extraocular movements are full. Visual fields are full. The patient appears to have some mild weakness with protrusion of the tongue to the left into the cheek, normal to the right. With tongue protrusion, the tongue is midline. No significant atrophy of the tongue is noted. Strength with jaw opening and closure is normal. Strength with head turning and shoulder shrug is normal bilaterally.  Motor: The patient has good strength in all 4 extremities.  Sensory examination: Soft touch  sensation is symmetric on the face, arms, and legs.  Coordination: The patient has good finger-nose-finger and heel-to-shin bilaterally.  Gait and station: The patient has a normal gait. Tandem gait is normal. Romberg is negative. No drift is seen.  Reflexes: Deep tendon reflexes are symmetric.   Assessment/Plan:  1. Headache  2. Right hypoglossal and marginal mandibular neuropathy  3. Chronic low back pain  The patient still has some mild residual tongue weakness and right lower facial weakness. Hopefully this will continue to improve over time. The patient still having some headaches although the headaches have improved on gabapentin. The dosing the gabapentin will be increased to a 200 mg 3 times daily regimen. The patient will follow-up in 4 months. This medication should help the low back pain as well.  Marlan Palau MD 09/25/2015 10:23 AM  [facility] [address] [place], [place] [address]  Phone [phone] Fax [phone]

## 2015-10-16 ENCOUNTER — Encounter: Payer: Self-pay | Admitting: Vascular Surgery

## 2015-10-19 NOTE — Progress Notes (Signed)
Established Carotid Patient  History of Present Illness  Heather [name] is a 70 y.o. (03-07-1945) female who presents with chief complaint: improved but continued tongue dysmotility.  Pt is s/p R CEA (05/03/15) complicated with hypoglossal and facial nerve palsy. Swallowing is improved enough to avoid choking but still not back to base line.  Pt also notes some intermittent hyperesthesia of the incision which has not required narcotic use  Previous carotid studies demonstrated: RICA 80-99% stenosis, LICA 40-59% stenosis.  Patient has no history of TIA or stroke symptom.  The patient has never had amaurosis fugax or monocular blindness.  The patient has never had facial drooping or hemiplegia.  The patient has never had receptive or expressive aphasia.    The patient's PMH, PSH, SH, and FamHx are unchanged from 07/14/15.  Current Outpatient Prescriptions  Medication Sig Dispense Refill  . acetaminophen (TYLENOL) 500 MG tablet Take 1,000 mg by mouth every 6 (six) hours as needed for moderate pain.    . Ascorbic Acid (VITAMIN C) 1000 MG tablet Take 1,000 mg by mouth daily.    Marland Kitchen aspirin EC 81 MG tablet Take 1 tablet (81 mg total) by mouth daily.    Marland Kitchen atorvastatin (LIPITOR) 40 MG tablet Take 1 tablet (40 mg total) by mouth daily. 30 tablet 11  . busPIRone (BUSPAR) 7.5 MG tablet Take 1 tablet (7.5 mg total) by mouth 2 (two) times daily. 60 tablet 1  . Cholecalciferol (VITAMIN D3) 1000 UNITS CAPS Take 1 capsule by mouth daily.     Marland Kitchen gabapentin (NEURONTIN) 100 MG capsule Take 2 capsules (200 mg total) by mouth 3 (three) times daily. 180 capsule 3  . loratadine (CLARITIN) 10 MG tablet Take 10 mg by mouth daily.      Marland Kitchen losartan (COZAAR) 100 MG tablet Take 1 tablet (100 mg total) by mouth daily. 30 tablet 11  . meloxicam (MOBIC) 15 MG tablet Take 1 tablet by mouth daily.    . metoprolol tartrate (LOPRESSOR) 25 MG tablet Take 1 tablet (25 mg total) by mouth 2 (two) times daily. 180 tablet 3  .  Multiple Vitamin (MULTIVITAMIN) tablet Take 1 tablet by mouth daily.      . sertraline (ZOLOFT) 50 MG tablet TAKE 2 TABLETS BY MOUTH EVERY DAY 180 tablet 1  . valACYclovir (VALTREX) 500 MG tablet TAKE 1 TABLET BY MOUTH DAILY**CAN INCREASE TO 2 TABS FOR 5 DAY IN THE EVENT OF RECURRENCE** 30 tablet 11   No current facility-administered medications for this visit.     Allergies  Allergen Reactions  . Sulfamethoxazole Swelling    Unspecified area of swelling    On ROS today: continued tongue dyscoordination, continued headache, hyperesthetic incision   Physical Examination  Vitals:   10/20/15 1230 10/20/15 1232  BP: (!) 169/70 (!) 152/66  Pulse: (!) 58   Resp: 18   Temp: 97.2 F (36.2 C)   TempSrc: Oral   SpO2: 99%   Weight: 162 lb 6.4 oz (73.7 kg)   Height: 5\' 5"  (1.651 m)    Body mass index is 27.02 kg/m.  General: A&O x 3, WDWN  Eyes: PERRLA, EOMI  Neck: Supple, no nuchal rigidity, no palpable LAD, healed incision with mild keloiding   Pulmonary: Sym exp, good air movt, CTAB, no rales, rhonchi, & wheezing  Cardiac: RRR, Nl S1, S2, no Murmurs, rubs or gallops  Vascular: Vessel Right Left  Radial Palpable Palpable  Brachial Palpable Palpable  Carotid Palpable, without bruit Palpable, without bruit  Aorta Not palpable N/A  Femoral Palpable Palpable  Popliteal Not palpable Not palpable  PT Palpable Palpable  DP Palpable Palpable   Gastrointestinal: soft, NTND, no G/R, no HSM, no masses, no CVAT B  Musculoskeletal: M/S 5/5 throughout , Extremities without ischemic changes ,   Neurologic: CN 2-12 intact , midline tongue, Pain and light touch intact in extremities , Motor exam as listed above   Medical Decision Making  Heather [name] is a 70 y.o. female who presents with: s/p R CEA complicated with slowly resolving hypoglossal and marginal mandibular palsy, asx R ICA stenosis <80%.   Based on the patient's vascular studies and examination, I have offered the  patient: B carotid duplex in 6 months  I discussed in depth with the patient the nature of atherosclerosis, and emphasized the importance of maximal medical management including strict control of blood pressure, blood glucose, and lipid levels, antiplatelet agents, obtaining regular exercise, and cessation of smoking.    The patient is aware that without maximal medical management the underlying atherosclerotic disease process will progress, limiting the benefit of any interventions. The patient is currently on a statin: Lipitor. The patient is currently on an anti-platelet: ASA.  Thank you for allowing us to participate in this patient's care.   Leonides SakeBrian Selassie Spatafore, MD, FACS [facility] Office: [phone] Pager: [phone]

## 2015-10-20 ENCOUNTER — Ambulatory Visit (INDEPENDENT_AMBULATORY_CARE_PROVIDER_SITE_OTHER): Payer: Medicare Other | Admitting: Vascular Surgery

## 2015-10-20 ENCOUNTER — Encounter: Payer: Self-pay | Admitting: Vascular Surgery

## 2015-10-20 VITALS — BP 152/66 | HR 58 | Temp 97.2°F | Resp 18 | Ht 65.0 in | Wt 162.4 lb

## 2015-10-20 DIAGNOSIS — I779 Disorder of arteries and arterioles, unspecified: Secondary | ICD-10-CM | POA: Diagnosis not present

## 2015-10-20 DIAGNOSIS — I739 Peripheral vascular disease, unspecified: Principal | ICD-10-CM

## 2015-11-01 ENCOUNTER — Other Ambulatory Visit: Payer: Self-pay | Admitting: Internal Medicine

## 2015-11-06 ENCOUNTER — Other Ambulatory Visit: Payer: Self-pay

## 2015-11-06 MED ORDER — GABAPENTIN 100 MG PO CAPS: 200.0000 mg | ORAL_CAPSULE | Freq: Three times a day (TID) | ORAL | 3 refills | Status: DC

## 2015-11-21 ENCOUNTER — Telehealth: Payer: Self-pay

## 2015-11-21 MED ORDER — GABAPENTIN 100 MG PO CAPS: 200.0000 mg | ORAL_CAPSULE | Freq: Three times a day (TID) | ORAL | 0 refills | Status: DC

## 2015-11-21 NOTE — Telephone Encounter (Signed)
90 day supply

## 2015-12-14 ENCOUNTER — Encounter (INDEPENDENT_AMBULATORY_CARE_PROVIDER_SITE_OTHER): Payer: Self-pay | Admitting: Specialist

## 2015-12-14 ENCOUNTER — Ambulatory Visit (INDEPENDENT_AMBULATORY_CARE_PROVIDER_SITE_OTHER): Payer: Medicare Other | Admitting: Specialist

## 2015-12-14 ENCOUNTER — Ambulatory Visit (INDEPENDENT_AMBULATORY_CARE_PROVIDER_SITE_OTHER): Payer: Medicare Other

## 2015-12-14 VITALS — BP 128/47 | HR 67 | Ht 65.0 in | Wt 159.0 lb

## 2015-12-14 DIAGNOSIS — M48062 Spinal stenosis, lumbar region with neurogenic claudication: Secondary | ICD-10-CM

## 2015-12-14 DIAGNOSIS — M25551 Pain in right hip: Secondary | ICD-10-CM

## 2015-12-14 NOTE — Progress Notes (Signed)
Office Visit Note   Patient: Heather [name]           Date of Birth: [birth year]           MRN: [id number] Visit Date: 12/14/2015              Requested by: No referring provider defined for this encounter. PCP: Nicki ReaperBAITY, REGINA, NP   Assessment & Plan: Visit Diagnoses:  1. Pain in right hip   2. Spinal stenosis of lumbar region with neurogenic claudication     Plan: We'll schedule patient for right hip diagnostic/therapeutic intra-articular Marcaine/Depo-Medrol injection. Advised patient to pay close attention to how she feels it medially after the injection. She will talk to her primary care physician and vascular surgeon to see about getting clearance for surgery with us. When this is done we will repeat lumbar spine MRI since her previous study was done October 2016. All questions answered.  Follow-Up Instructions: Return in about 3 months (around 03/15/2016).   Orders:  Orders Placed This Encounter  Procedures  . XR Pelvis 1-2 Views   No orders of the defined types were placed in this encounter.     Procedures: No procedures performed   Clinical Data: No additional findings.   Subjective: Chief Complaint  Patient presents with  . Lower Back - Pain, Follow-up    Patient returns to recheck low back pain. States her back is still giving her some trouble. Pain radiates into bilateral legs. States it use to be just her left leg but the right leg is starting to hurt also. Feels like her legs are becoming weaker. Denies any numbness or tingling.   Patient states that her symptoms may be a little worse since she was last seen in our office. She is wanting to proceed with lumbar spine surgery that was last discussed with Dr. Otelia SergeantNitka that she will be needing medical and vascular surgery clearance.  Patient states that she had an urgent right carotid endarterectomy procedure by Dr. Fredderick Severancehinn April 2017. States that she also has stenosis on the left side as well. Patient also  complaining today of right groin pain. States that she's had previous surgery on the right hip. Pain was she is ambulating. After patient's carotid surgery she does report having a complication with right-sided mouth numbness and facial droop. She has been worked up for stroke and imaging studies were unremarkable per her report. She has also been seen by neurology.  Review of Systems  Constitutional: Negative.   HENT: Negative.   Eyes: Negative.   Cardiovascular: Negative.   Gastrointestinal: Negative.   Genitourinary: Negative.   Musculoskeletal: Positive for back pain.  Neurological: Positive for speech difficulty and numbness.  Psychiatric/Behavioral: Negative.      Objective: Vital Signs: BP (!) 128/47   Pulse 67   Ht 5\' 5"  (1.651 m)   Wt 159 lb (72.1 kg)   LMP 12/29/2011   BMI 26.46 kg/m   Physical Exam  Constitutional: She is oriented to person, place, and time. No distress.  HENT:  Head: Normocephalic and atraumatic.  Eyes: EOM are normal. Pupils are equal, round, and reactive to light.  Neck: Normal range of motion.  Pulmonary/Chest: No respiratory distress.  Abdominal: She exhibits no distension.  Neurological: She is alert and oriented to person, place, and time.  Skin: Skin is warm and dry.    Ortho Exam Negative straight leg raise. She has right groin pain with hip flexion and internal/external rotation. Negative on the  left side. Bilateral calves nontender. Specialty Comments:  No specialty comments available.  Imaging: Xr Pelvis 1-2 Views  Result Date: 12/14/2015 X-ray right hip shows moderate DJD and some flattening of the femoral head. She has 3 screws in the proximal one third of the femur from previous surgery. Impression right hip DJD.    PMFS History: Patient Active Problem List   Diagnosis Date Noted  . Other headache syndrome 09/25/2015  . 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 Carotid disease, bilateral (HCC)  03/03/2015  . Chronic back pain 03/03/2014  . Seasonal allergies 03/03/2014  . Genital herpes 03/03/2014  . Hyperlipidemia 03/03/2013  . Prediabetes 03/03/2013  . Anxiety and depression 12/11/2011  . Essential hypertension 11/19/2006  . MITRAL VALVE PROLAPSE 11/19/2006   Past Medical History:  Diagnosis Date  . Anxiety   . Chicken pox   . Depression   . Headache 06/23/2015  . Heart murmur    MVP  . History of blood transfusion    1975 - leg fracture  . HOH (hard of hearing)    left  . Hypertension   . MVP (mitral valve prolapse)    echo 1986-mild  . 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 Shortness of breath dyspnea    WITH EXERTION    . Wears glasses     Family History  Problem Relation Age of Onset  . Adopted: Yes  . Osteoporosis Mother   . Heart disease Mother   . Hypertension Mother   . Hyperlipidemia Mother   . Stroke Mother   . Arthritis Mother   . Diabetes Father   . Heart attack Father   . Cancer Sister     breast  . Colon cancer Neg Hx     Past Surgical History:  Procedure Laterality Date  . CARPAL TUNNEL RELEASE Right 03/31/2013   Procedure: RIGHT CARPAL TUNNEL RELEASE;  Surgeon: Nicki ReaperGary R Kuzma, MD;  Location: [facility];  Service: Orthopedics;  Laterality: Right;  . ENDARTERECTOMY Right 05/03/2015   Procedure: RIGHT CAROTID ENDARTERECTOMY ;  Surgeon: Fransisco HertzBrian L Chen, MD;  [facility];  Service: Vascular;  Laterality: Right;  . NASAL SINUS SURGERY  1975  . PLANTAR FASCIA RELEASE  1968   both feet  . ROTATOR CUFF REPAIR Right 1998  . ROTATOR CUFF REPAIR Right   . TIBIA FRACTURE SURGERY Right 1975   rt  . TONSILLECTOMY  1951  . TUBAL LIGATION  1995   Social History   Occupational History  . Retired    Social History Main Topics  . Smoking status: Never Smoker  . Smokeless tobacco: Never Used  . Alcohol use 1.8 oz/week    3 Cans of beer per week   . Drug use: No  . Sexual activity: Yes

## 2016-01-01 NOTE — Addendum Note (Signed)
Addended by: Burton ApleyPETTY, Deborah Lazcano A on: 01/01/2016 03:46 PM   Modules accepted: Orders

## 2016-01-02 ENCOUNTER — Ambulatory Visit (INDEPENDENT_AMBULATORY_CARE_PROVIDER_SITE_OTHER): Payer: Medicare Other | Admitting: Physical Medicine and Rehabilitation

## 2016-01-02 ENCOUNTER — Encounter (INDEPENDENT_AMBULATORY_CARE_PROVIDER_SITE_OTHER): Payer: Self-pay | Admitting: Physical Medicine and Rehabilitation

## 2016-01-02 VITALS — BP 152/60 | HR 68

## 2016-01-02 DIAGNOSIS — M25551 Pain in right hip: Secondary | ICD-10-CM | POA: Diagnosis not present

## 2016-01-02 MED ORDER — TRIAMCINOLONE ACETONIDE 40 MG/ML IJ SUSP
80.0000 mg | INTRAMUSCULAR | Status: AC | PRN
  Administered 2016-01-02: 80 mg via INTRA_ARTICULAR

## 2016-01-02 MED ORDER — LIDOCAINE HCL 2 % IJ SOLN
4.0000 mL | INTRAMUSCULAR | Status: AC | PRN
  Administered 2016-01-02: 4 mL

## 2016-01-02 NOTE — Patient Instructions (Signed)

## 2016-01-02 NOTE — Progress Notes (Signed)
Heather [name] - 70 y.o. female MRN [id number]  Date of birth: 04/05/1945  Office Visit Note: Visit Date: 01/02/2016 PCP: Heather [name], REGINA, NP Referred by: Heather [name], Heather W, NP  Subjective: Chief Complaint  Patient presents with  . Right Hip - Pain   HPI: [name] is a pleasant 70 year old female with right hip pain into groin and down to knee. Constant pain and doesn't relate it to any certain movement. She's having groin pain down to the anterior knee. She has a history of lumbar issues and we seen her in the past for lumbar injection many years ago.    ROS Otherwise per HPI.  Assessment & Plan: Visit Diagnoses:  1. Pain in right hip     Plan: Findings:  Diagnostic and hopefully therapeutic right anesthetic hip arthrogram.    Meds & Orders: No orders of the defined types were placed in this encounter.   Orders Placed This Encounter  Procedures  . Large Joint Injection/Arthrocentesis    Follow-up: Return for Follow-up with Dr. Otelia [name] as scheduled..   Procedures: Large Joint Inj Date/Time: 01/02/2016 1:41 PM Performed by: Heather [name], Heather [name] Authorized by: Heather [name], Heather [name]   Consent Given by:  Patient Site marked: the procedure site was marked   Timeout: prior to procedure the correct patient, procedure, and site was verified   Indications:  Pain Location:  Hip Site:  R hip joint Prep: patient was prepped and draped in usual sterile fashion   Needle Size:  22 G Needle Length:  3.5 inches Approach:  Anterior Ultrasound Guidance: No   Fluoroscopic Guidance: No   Arthrogram: Yes   Medications:  4 mL lidocaine 2 %; 80 mg triamcinolone acetonide 40 MG/ML Aspiration Attempted: Yes   Patient tolerance:  Patient tolerated the procedure well with no immediate complications  Arthrogram demonstrated excellent flow of contrast throughout the joint surface without extravasation or obvious defect.  The patient had relief of symptoms during the anesthetic phase of the  injection.     No notes on file   Clinical History: No specialty comments available.  She reports that she has never smoked. She has never used smokeless tobacco.   Recent Labs  02/21/15 1331  HGBA1C 5.8    Objective:  VS:  HT:    WT:   BMI:     BP:(!) 152/60  HR:68bpm  TEMP: ( )  RESP:97 % Physical Exam  Ortho Exam Imaging: No results found.  Past Medical/Family/Surgical/Social History: Medications & Allergies reviewed per EMR Patient Active Problem List   Diagnosis Date Noted  . Other headache syndrome 09/25/2015  . 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 Carotid disease, bilateral (HCC) 03/03/2015  . Chronic back pain 03/03/2014  . Seasonal allergies 03/03/2014  . Genital herpes 03/03/2014  . Hyperlipidemia 03/03/2013  . Prediabetes 03/03/2013  . Anxiety and depression 12/11/2011  . Essential hypertension 11/19/2006  . MITRAL VALVE PROLAPSE 11/19/2006   Past Medical History:  Diagnosis Date  . Anxiety   . Chicken pox   . Depression   . Headache 06/23/2015  . Heart murmur    MVP  . History of blood transfusion    1975 - leg fracture  . HOH (hard of hearing)    left  . Hypertension   . MVP (mitral valve prolapse)    echo 1986-mild  . 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 Shortness of breath dyspnea  WITH EXERTION    . Wears glasses    Family History  Problem Relation Age of Onset  . Adopted: Yes  . Osteoporosis Mother   . Heart disease Mother   . Hypertension Mother   . Hyperlipidemia Mother   . Stroke Mother   . Arthritis Mother   . Diabetes Father   . Heart attack Father   . Cancer Sister     breast  . Colon cancer Neg Hx    Past Surgical History:  Procedure Laterality Date  . CARPAL TUNNEL RELEASE Right 03/31/2013   Procedure: RIGHT CARPAL TUNNEL RELEASE;  Surgeon: Heather ReaperGary R Kuzma, MD;  Location: [facility];  Service: Orthopedics;  Laterality: Right;  . ENDARTERECTOMY Right 05/03/2015   Procedure: RIGHT CAROTID ENDARTERECTOMY ;  Surgeon: Heather HertzBrian L Chen, MD;  [facility];  Service: Vascular;  Laterality: Right;  . NASAL SINUS SURGERY  1975  . PLANTAR FASCIA RELEASE  1968   both feet  . ROTATOR CUFF REPAIR Right 1998  . ROTATOR CUFF REPAIR Right   . TIBIA FRACTURE SURGERY Right 1975   rt  . TONSILLECTOMY  1951  . TUBAL LIGATION  1995   Social History   Occupational History  . Retired    Social History Main Topics  . Smoking status: Never Smoker  . Smokeless tobacco: Never Used  . Alcohol use 1.8 oz/week    3 Cans of beer per week  . Drug use: No  . Sexual activity: Yes

## 2016-01-04 ENCOUNTER — Ambulatory Visit (INDEPENDENT_AMBULATORY_CARE_PROVIDER_SITE_OTHER): Payer: Medicare Other | Admitting: Cardiovascular Disease

## 2016-01-04 ENCOUNTER — Encounter: Payer: Self-pay | Admitting: Cardiovascular Disease

## 2016-01-04 VITALS — BP 140/60 | HR 63 | Ht 65.0 in | Wt 166.8 lb

## 2016-01-04 DIAGNOSIS — E782 Mixed hyperlipidemia: Secondary | ICD-10-CM | POA: Diagnosis not present

## 2016-01-04 DIAGNOSIS — I10 Essential (primary) hypertension: Secondary | ICD-10-CM | POA: Diagnosis not present

## 2016-01-04 NOTE — Progress Notes (Signed)
Cardiology Office Note   Date:  01/04/2016   ID:  Heather ReichertMary J [name], DOB 13-Jan-1946, MRN [id number]  PCP:  Nicki ReaperBAITY, REGINA, NP  Cardiologist:   Kristeen MissPhilip Gavriella Hearst, MD   Chief Complaint  Patient presents with  . Follow-up    HTN, hyperlipidmemia      Problem List 1. Mitral valve prolapse 2. Essential HTN 3 Carotid artery disease 4. Hyperlipidemia 2. Status post right carotid endarterectomy    Feb. 8, 2017 Heather [name] is a 70 y.o. female who presents for pre op evaluation prior to having carotid artery endarterectomy She has a history of mitral valve prolapse and has previously seen Dr. Aleen Campiysinger. She's not seen a cardiologist for years. She has had some tachypalpitations in the past.  She's had some hyperlipidemia. She also has hypertension. She does not take her BP at home but thinks todays elevated BP is more due to white coat HTN  She has had some exertional chest tightness -  Last for several minutes ( or until she stops walking )  Has been going on for several months   Eats lots of processed foods and fast foods.     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  She has a history of hyperlipidemia. We started atorvastatin 40 mg a day. Her last lipid levels look very good.  Dec. 7, 2017:  Heather [name] is doing ok. Having back pain .   Was supposed to have back surgery earlier this year - had urgent carotid surgery instead ( and had a CVA as a complication of that )  Has some dyspnea.   Thinks its allergies.   Past Medical History:  Diagnosis Date  . Anxiety   . Chicken pox   . Depression   . Headache 06/23/2015  . Heart murmur    MVP  . History of blood transfusion    1975 - leg fracture  . HOH (hard of hearing)    left  . Hypertension   . MVP (mitral valve prolapse)    echo 1986-mild  .  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 Shortness of breath dyspnea    WITH EXERTION    . Wears glasses     Past Surgical History:  Procedure Laterality Date  . CARPAL TUNNEL RELEASE Right 03/31/2013   Procedure: RIGHT CARPAL TUNNEL RELEASE;  Surgeon: Nicki ReaperGary R Kuzma, MD;  Location: [facility];  Service: Orthopedics;  Laterality: Right;  . ENDARTERECTOMY Right 05/03/2015   Procedure: RIGHT CAROTID ENDARTERECTOMY ;  Surgeon: Fransisco HertzBrian L Chen, MD;  [facility];  Service: Vascular;  Laterality: Right;  . NASAL SINUS SURGERY  1975  . PLANTAR FASCIA RELEASE  1968   both feet  . ROTATOR CUFF REPAIR Right 1998  . ROTATOR CUFF REPAIR Right   . TIBIA FRACTURE SURGERY Right 1975   rt  . TONSILLECTOMY  1951  . TUBAL LIGATION  1995     Current Outpatient Prescriptions  Medication Sig Dispense Refill  . acetaminophen (TYLENOL) 500 MG tablet Take 1,000 mg by mouth every 6 (six) hours as needed for moderate pain.    . Ascorbic Acid (VITAMIN C) 1000 MG tablet Take 1,000 mg by mouth daily.    .Marland Kitchen  aspirin EC 81 MG tablet Take 1 tablet (81 mg total) by mouth daily.    Marland Kitchen. atorvastatin (LIPITOR) 40 MG tablet Take 1 tablet (40 mg total) by mouth daily. 30 tablet 11  . busPIRone (BUSPAR) 7.5 MG tablet TAKE 1 TABLET (7.5 MG TOTAL) BY MOUTH 2 (TWO) TIMES DAILY. 180 tablet 0  . Cholecalciferol (VITAMIN D3) 1000 UNITS CAPS Take 1 capsule by mouth daily.     Marland Kitchen. gabapentin (NEURONTIN) 100 MG capsule Take 2 capsules (200 mg total) by mouth 3 (three) times daily. 540 capsule 0  . loratadine (CLARITIN) 10 MG tablet Take 10 mg by mouth daily.      Marland Kitchen. losartan (COZAAR) 100 MG tablet Take 1 tablet (100 mg total) by mouth daily. 30 tablet 11  . meloxicam (MOBIC) 15 MG tablet Take 1 tablet by mouth daily.    . metoprolol tartrate (LOPRESSOR) 25 MG tablet Take 1 tablet (25 mg total) by mouth 2 (two) times daily. 180 tablet 3  .  Multiple Vitamin (MULTIVITAMIN) tablet Take 1 tablet by mouth daily.      . sertraline (ZOLOFT) 50 MG tablet TAKE 2 TABLETS BY MOUTH EVERY DAY 180 tablet 1  . valACYclovir (VALTREX) 500 MG tablet TAKE 1 TABLET BY MOUTH DAILY**CAN INCREASE TO 2 TABS FOR 5 DAY IN THE EVENT OF RECURRENCE** 30 tablet 11   No current facility-administered medications for this visit.     Allergies:   Sulfamethoxazole    Social History:  The patient  reports that she has never smoked. She has never used smokeless tobacco. She reports that she drinks about 1.8 oz of alcohol per week . She reports that she does not use drugs.   Family History:  The patient's family history includes Arthritis in her mother; Cancer in her sister; Diabetes in her father; Heart attack in her father; Heart disease in her mother; Hyperlipidemia in her mother; Hypertension in her mother; Osteoporosis in her mother; Stroke in her mother. She was adopted.    ROS:  Please see the history of present illness.    Review of Systems: Constitutional:  denies fever, chills, diaphoresis, appetite change and fatigue.  HEENT: denies photophobia, eye pain, redness, hearing loss, ear pain, congestion, sore throat, rhinorrhea, sneezing, neck pain, neck stiffness and tinnitus.  Respiratory: denies SOB, DOE, cough, chest tightness, and wheezing.  Cardiovascular: denies chest pain, palpitations and leg swelling.  Gastrointestinal: denies nausea, vomiting, abdominal pain, diarrhea, constipation, blood in stool.  Genitourinary: denies dysuria, urgency, frequency, hematuria, flank pain and difficulty urinating.  Musculoskeletal: denies  myalgias, back pain, joint swelling, arthralgias and gait problem.   Skin: denies pallor, rash and wound.  Neurological: denies dizziness, seizures, syncope, weakness, light-headedness, numbness and headaches.   Hematological: denies adenopathy, easy bruising, personal or family bleeding history.  Psychiatric/ Behavioral:  denies suicidal ideation, mood changes, confusion, nervousness, sleep disturbance and agitation.       All other systems are reviewed and negative.    PHYSICAL EXAM: VS:  BP 140/60 (BP Location: Left Arm, Patient Position: Sitting, Cuff Size: Normal)   Pulse 63   Ht 5\' 5"  (1.651 m)   Wt 166 lb 12.8 oz (75.7 kg)   LMP 12/29/2011   BMI 27.76 kg/m  , BMI Body mass index is 27.76 kg/m. GEN: Well nourished, well developed, in no acute distress  HEENT:  + thrush on the back of her tongue Neck: no JVD,    Has a right CEA scar . Cardiac: RRR;2/6 systolic murmur ,  rubs, or gallops,no edema  Respiratory:  clear to auscultation bilaterally, normal work of breathing GI: soft, nontender, nondistended, + BS MS: no deformity or atrophy  Skin: warm and dry, no rash Neuro:  Strength and sensation are intact, some weakness of her right side of her mouth  Psych: normal   EKG:  EKG is ordered today. Dec. 7, 2017:  NSR at 63,  Occasional PACs    Recent Labs: 05/04/2015: Hemoglobin 9.8; Platelets 195 06/05/2015: ALT 24 07/27/2015: BUN 20; Creat 0.84; Potassium 4.8; Sodium 140    Lipid Panel    Component Value Date/Time   CHOL 162 06/05/2015 0825   TRIG 111 06/05/2015 0825   HDL 63 06/05/2015 0825   CHOLHDL 2.6 06/05/2015 0825   VLDL 22 06/05/2015 0825   LDLCALC 77 06/05/2015 0825   LDLDIRECT 132.0 02/21/2015 1331      Wt Readings from Last 3 Encounters:  01/04/16 166 lb 12.8 oz (75.7 kg)  12/14/15 159 lb (72.1 kg)  10/20/15 162 lb 6.4 oz (73.7 kg)      Other studies Reviewed: Additional studies/ records that were reviewed today include: . Review of the above records demonstrates:    ASSESSMENT AND PLAN:  1.  Carotid artery disease - a Lexiscan myoview performed before her CEA surgery revealed no ischemia and normal LV function - EF  58%.   No CP   2.Essential hypertension - BP is better.  Increase Losartan to 100 mg a day .  Checks her BP at home . Needs to watch her diet  better . Advised her that weight loss will help with her BP   3. Hyperlipidemia -  on atorvastatin 40 mg a day. Total cholesterol is 162 now. Her triglyceride level is 111. HDL is 63. LDL was 77.  Continue current dose of atorvastatin.   Current medicines are reviewed at length with the patient today.  The patient does not have concerns regarding medicines.  The following changes have been made:  no change  Labs/ tests ordered today include:  No orders of the defined types were placed in this encounter.   Disposition:   FU with me in 6  months    Kristeen Miss, MD  01/04/2016 10:00 AM    [facility] [address] [place], [place], [place]  [address] Phone: [phone]; Fax: [phone]

## 2016-01-04 NOTE — Patient Instructions (Signed)
Medication Instructions:  Your physician recommends that you continue on your current medications as directed. Please refer to the Current Medication list given to you today.   Labwork: None Ordered   Testing/Procedures: None Ordered   Follow-Up: Your physician wants you to follow-up in: 1 year with Dr. Nahser.  You will receive a reminder letter in the mail two months in advance. If you don't receive a letter, please call our office to schedule the follow-up appointment.   If you need a refill on your cardiac medications before your next appointment, please call your pharmacy.   Thank you for choosing [facility]! Kynli Chou, RN [phone]    

## 2016-01-29 ENCOUNTER — Other Ambulatory Visit: Payer: Self-pay | Admitting: Internal Medicine

## 2016-01-30 NOTE — Progress Notes (Deleted)
PATIENT: Heather [name] DOB: 1945/12/31  REASON FOR VISIT: follow up HISTORY FROM: patient  HISTORY OF PRESENT ILLNESS: HISTORY Ms. 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 The patient has gained some improvement in tongue and facial strength, but she is still not back to her usual baseline. The patient also complained of some problems with a temporal headache, the headache is on both sides, which also has a sensation of a crick in her neck on the right side. The patient denies any pain going down the arms. The patient reports some word finding problems at times, she has difficulty carrying on conversation. If she talks a lot there is a fatigue factor with the face and tongue. The patient may have occasional days without headache, but most days she does have some headache but the headache is not disabling. She has chronic low back pain as well. She has been placed on low-dose gabapentin taking 100 mg 3 times daily. This has helped some with the low back pain and with the headache. She is not too drowsy on the medication.   REVIEW OF SYSTEMS: Out of a complete 14 system review of symptoms, the patient complains only of the following symptoms, and all other reviewed systems are negative.  ALLERGIES: Allergies  Allergen Reactions  . Sulfamethoxazole Swelling    Unspecified area of swelling    HOME MEDICATIONS: Outpatient Medications Prior to Visit  Medication Sig Dispense Refill  . acetaminophen (TYLENOL) 500 MG tablet Take 1,000 mg by mouth every 6 (six) hours as needed for moderate pain.    . Ascorbic Acid (VITAMIN C) 1000 MG tablet Take 1,000 mg by mouth daily.    Marland Kitchen aspirin EC 81 MG tablet Take 1 tablet (81 mg total) by mouth daily.    Marland Kitchen atorvastatin (LIPITOR) 40 MG tablet Take 1 tablet (40 mg total) by mouth daily. 30  tablet 11  . busPIRone (BUSPAR) 7.5 MG tablet TAKE 1 TABLET (7.5 MG TOTAL) BY MOUTH 2 (TWO) TIMES DAILY. 180 tablet 1  . Cholecalciferol (VITAMIN D3) 1000 UNITS CAPS Take 1 capsule by mouth daily.     Marland Kitchen gabapentin (NEURONTIN) 100 MG capsule Take 2 capsules (200 mg total) by mouth 3 (three) times daily. 540 capsule 0  . loratadine (CLARITIN) 10 MG tablet Take 10 mg by mouth daily.      Marland Kitchen losartan (COZAAR) 100 MG tablet Take 1 tablet (100 mg total) by mouth daily. 30 tablet 11  . meloxicam (MOBIC) 15 MG tablet Take 1 tablet by mouth daily.    . metoprolol tartrate (LOPRESSOR) 25 MG tablet Take 1 tablet (25 mg total) by mouth 2 (two) times daily. 180 tablet 3  . Multiple Vitamin (MULTIVITAMIN) tablet Take 1 tablet by mouth daily.      . sertraline (ZOLOFT) 50 MG tablet TAKE 2 TABLETS BY MOUTH EVERY DAY 180 tablet 1  . valACYclovir (VALTREX) 500 MG tablet TAKE 1 TABLET BY MOUTH DAILY**CAN INCREASE TO 2 TABS FOR 5 DAY IN THE EVENT OF RECURRENCE** 30 tablet 11   No facility-administered medications prior to visit.     PAST MEDICAL HISTORY: Past Medical History:  Diagnosis Date  . Anxiety   . Chicken pox   . Depression   . Headache 06/23/2015  . Heart murmur    MVP  . History of blood transfusion  1975 - leg fracture  . HOH (hard of hearing)    left  . Hypertension   . MVP (mitral valve prolapse)    echo 1986-mild  . 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 Shortness of breath dyspnea    WITH EXERTION    . Wears glasses     PAST SURGICAL HISTORY: Past Surgical History:  Procedure Laterality Date  . CARPAL TUNNEL RELEASE Right 03/31/2013   Procedure: RIGHT CARPAL TUNNEL RELEASE;  Surgeon: Nicki ReaperGary R Kuzma, MD;  Location: [facility];  Service: Orthopedics;  Laterality: Right;  . ENDARTERECTOMY Right 05/03/2015   Procedure: RIGHT CAROTID ENDARTERECTOMY ;  Surgeon: Fransisco HertzBrian L Chen, MD;   [facility];  Service: Vascular;  Laterality: Right;  . NASAL SINUS SURGERY  1975  . PLANTAR FASCIA RELEASE  1968   both feet  . ROTATOR CUFF REPAIR Right 1998  . ROTATOR CUFF REPAIR Right   . TIBIA FRACTURE SURGERY Right 1975   rt  . TONSILLECTOMY  1951  . TUBAL LIGATION  1995    FAMILY HISTORY: Family History  Problem Relation Age of Onset  . Adopted: Yes  . Osteoporosis Mother   . Heart disease Mother   . Hypertension Mother   . Hyperlipidemia Mother   . Stroke Mother   . Arthritis Mother   . Diabetes Father   . Heart attack Father   . Cancer Sister     breast  . Colon cancer Neg Hx     SOCIAL HISTORY: Social History   Social History  . Marital status: Divorced    Spouse name: N/A  . Number of children: 1  . Years of education: 4612   Occupational History  . Retired    Social History Main Topics  . Smoking status: Never Smoker  . Smokeless tobacco: Never Used  . Alcohol use 1.8 oz/week    3 Cans of beer per week  . Drug use: No  . Sexual activity: Yes   Other Topics Concern  . Not on file   Social History Narrative   Lives at home w/ significant other   Right-handed   Drinks 2 cups caffeine per day      PHYSICAL EXAM  There were no vitals filed for this visit. There is no height or weight on file to calculate BMI.  Generalized: Well developed, in no acute distress   Neurological examination  Mentation: Alert oriented to time, place, history taking. Follows all commands speech and language fluent Cranial nerve II-XII: Pupils were equal round reactive to light. Extraocular movements were full, visual field were full on confrontational test. Facial sensation and strength were normal. Uvula tongue midline. Head turning and shoulder shrug  were normal and symmetric. Motor: The motor testing reveals 5 over 5 strength of all 4 extremities. Good symmetric motor tone is noted throughout.  Sensory: Sensory testing is intact to soft touch on all 4  extremities. No evidence of extinction is noted.  Coordination: Cerebellar testing reveals good finger-nose-finger and heel-to-shin bilaterally.  Gait and station: Gait is normal. Tandem gait is normal. Romberg is negative. No drift is seen.  Reflexes: Deep tendon reflexes are symmetric and normal bilaterally.   DIAGNOSTIC DATA (LABS, IMAGING, TESTING) - I reviewed patient records, labs, notes, testing and imaging myself where available.  Lab Results  Component Value Date   WBC 8.4 05/04/2015   HGB 9.8 (L) 05/04/2015  HCT 28.8 (L) 05/04/2015   MCV 93.5 05/04/2015   PLT 195 05/04/2015      Component Value Date/Time   NA 140 07/27/2015 1016   K 4.8 07/27/2015 1016   CL 104 07/27/2015 1016   CO2 28 07/27/2015 1016   GLUCOSE 104 (H) 07/27/2015 1016   BUN 20 07/27/2015 1016   CREATININE 0.84 07/27/2015 1016   CALCIUM 9.6 07/27/2015 1016   PROT 5.9 (L) 06/05/2015 0825   ALBUMIN 4.0 06/05/2015 0825   AST 16 06/05/2015 0825   ALT 24 06/05/2015 0825   ALKPHOS 93 06/05/2015 0825   BILITOT 0.4 06/05/2015 0825   GFRNONAA >60 05/04/2015 0450   GFRAA >60 05/04/2015 0450   Lab Results  Component Value Date   CHOL 162 06/05/2015   HDL 63 06/05/2015   LDLCALC 77 06/05/2015   LDLDIRECT 132.0 02/21/2015   TRIG 111 06/05/2015   CHOLHDL 2.6 06/05/2015   Lab Results  Component Value Date   HGBA1C 5.8 02/21/2015   No results found for: [id number] Lab Results  Component Value Date   TSH 1.81 03/03/2013      ASSESSMENT AND PLAN 71 y.o. year old female  has a past medical history of Anxiety; Chicken pox; Depression; Headache (06/23/2015); Heart murmur; History of blood transfusion; HOH (hard of hearing); Hypertension; MVP (mitral valve prolapse); Primary genital herpes simplex infection (08/21/2012); Shortness of breath dyspnea; and Wears glasses. here with ***     Butch Penny, MSN, NP-C 01/30/2016, 4:49 PM [facility] [address], [address] [place], [place]  [address] [phone]

## 2016-01-31 ENCOUNTER — Ambulatory Visit: Payer: Medicare Other | Admitting: Adult Health

## 2016-01-31 ENCOUNTER — Telehealth: Payer: Self-pay | Admitting: *Deleted

## 2016-01-31 NOTE — Telephone Encounter (Signed)
no showed f/u 

## 2016-02-01 ENCOUNTER — Encounter: Payer: Self-pay | Admitting: Adult Health

## 2016-02-01 ENCOUNTER — Ambulatory Visit: Payer: Medicare Other | Admitting: Adult Health

## 2016-02-06 ENCOUNTER — Ambulatory Visit: Payer: Medicare Other | Admitting: Adult Health

## 2016-02-07 ENCOUNTER — Ambulatory Visit (INDEPENDENT_AMBULATORY_CARE_PROVIDER_SITE_OTHER): Payer: Medicare Other | Admitting: Specialist

## 2016-02-07 ENCOUNTER — Encounter (INDEPENDENT_AMBULATORY_CARE_PROVIDER_SITE_OTHER): Payer: Self-pay | Admitting: Specialist

## 2016-02-07 VITALS — Ht 65.0 in | Wt 166.0 lb

## 2016-02-07 DIAGNOSIS — M5136 Other intervertebral disc degeneration, lumbar region: Secondary | ICD-10-CM | POA: Diagnosis not present

## 2016-02-07 DIAGNOSIS — M48062 Spinal stenosis, lumbar region with neurogenic claudication: Secondary | ICD-10-CM | POA: Diagnosis not present

## 2016-02-07 MED ORDER — ACETAMINOPHEN-CODEINE #3 300-30 MG PO TABS: 1.0000 | ORAL_TABLET | Freq: Three times a day (TID) | ORAL | 0 refills | Status: DC | PRN

## 2016-02-07 NOTE — Patient Instructions (Signed)
Avoid bending, stooping and avoid lifting weights greater than 10 lbs. Avoid prolong standing and walking. Avoid frequent bending and stooping  No lifting greater than 10 lbs. May use ice or moist heat for pain. Weight loss is of benefit. Handicap license is approved. Stationary bicycle or pool walking exercises.

## 2016-02-07 NOTE — Progress Notes (Signed)
Office Visit Note   Patient: Heather [name]           Date of Birth: August 16, 1945           MRN: [id number] Visit Date: 02/07/2016              Requested by: Lorre Munroe, NP [address] [place], [place] [address] PCP: Nicki Reaper, NP   Assessment & Plan: Visit Diagnoses:  1. Spinal stenosis of lumbar region with neurogenic claudication   2. Degenerative disc disease, lumbar     Plan: Avoid bending, stooping and avoid lifting weights greater than 10 lbs. Avoid prolong standing and walking. Avoid frequent bending and stooping  No lifting greater than 10 lbs. May use ice or moist heat for pain. Weight loss is of benefit. Handicap license is approved. Stationary bicycle or pool walking exercises.   Follow-Up Instructions: Return in about 3 months (around 05/07/2016).   Orders:  No orders of the defined types were placed in this encounter.  No orders of the defined types were placed in this encounter.     Procedures: No procedures performed   Clinical Data: No additional findings.   Subjective: Chief Complaint  Patient presents with  . Right Hip - Pain    Ms. Bovey is here to follow up after right hip injection with Dr. Alvester Morin on 01/02/2016.  She states that it has done pretty well for her. She does state that she has pain every now and then, but over all she is better than she was before the injection.Going from standing to sitting increases the pain. Complains of leg cramping both legs from the knees down at night. Sleeping in fetal position is better. Stretching causes cramping. Underwent right carotid endarterectomy by Dr. Leonides Sake, some dysarthria and difficulty voicing her thoughts. She is to see Dr. Anne Hahn Has been through rehabilitation for left sided facial weakness and dysarthria.     Review of Systems  Constitutional: Negative.   HENT: Negative.   Eyes: Negative.   Respiratory: Negative.   Cardiovascular: Negative.     Gastrointestinal: Negative.   Endocrine: Negative.   Genitourinary: Negative.   Musculoskeletal: Negative.   Skin: Negative.   Allergic/Immunologic: Negative.   Neurological: Negative.   Hematological: Negative.   Psychiatric/Behavioral: Negative.      Objective: Vital Signs: Ht 5\' 5"  (1.651 m)   Wt 166 lb (75.3 kg)   LMP 12/29/2011   BMI 27.62 kg/m   Physical Exam  Constitutional: She is oriented to person, place, and time. She appears well-developed and well-nourished.  HENT:  Head: Normocephalic and atraumatic.  Eyes: EOM are normal. Pupils are equal, round, and reactive to light.  Neck: Normal range of motion. Neck supple.  Pulmonary/Chest: Effort normal and breath sounds normal.  Abdominal: Soft. Bowel sounds are normal.  Neurological: She is [name] and oriented to person, place, and time.  Skin: Skin is warm and dry.  Psychiatric: She has a normal mood and affect. Her behavior is normal. Judgment and thought content normal.    Back Exam   Tenderness  The patient is experiencing tenderness in the lumbar.  Range of Motion  Extension: abnormal  Flexion: abnormal  Lateral Bend Right: abnormal  Lateral Bend Left: abnormal  Rotation Right: abnormal  Rotation Left: abnormal   Muscle Strength  Right Quadriceps:  5/5  Left Quadriceps:  5/5  Right Hamstrings:  5/5  Left Hamstrings:  5/5   Tests  Straight leg raise right:  negative Straight leg raise left: negative  Reflexes  Patellar: normal Achilles: normal Babinski's sign: normal   Other  Toe Walk: normal Heel Walk: normal Sensation: normal Gait: abnormal  Erythema: no back redness Scars: absent      Specialty Comments:  No specialty comments available.  Imaging: No results found.   PMFS History: Patient Active Problem List   Diagnosis Date Noted  . Other headache syndrome 09/25/2015  . 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 Carotid disease, bilateral  (HCC) 03/03/2015  . Chronic back pain 03/03/2014  . Seasonal allergies 03/03/2014  . Genital herpes 03/03/2014  . Hyperlipidemia 03/03/2013  . Prediabetes 03/03/2013  . Anxiety and depression 12/11/2011  . Essential hypertension 11/19/2006  . MITRAL VALVE PROLAPSE 11/19/2006   Past Medical History:  Diagnosis Date  . Anxiety   . Chicken pox   . Depression   . Headache 06/23/2015  . Heart murmur    MVP  . History of blood transfusion    1975 - leg fracture  . HOH (hard of hearing)    left  . Hypertension   . MVP (mitral valve prolapse)    echo 1986-mild  . 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 Shortness of breath dyspnea    WITH EXERTION    . Wears glasses     Family History  Problem Relation Age of Onset  . Adopted: Yes  . Osteoporosis Mother   . Heart disease Mother   . Hypertension Mother   . Hyperlipidemia Mother   . Stroke Mother   . Arthritis Mother   . Diabetes Father   . Heart attack Father   . Cancer Sister     breast  . Colon cancer Neg Hx     Past Surgical History:  Procedure Laterality Date  . CARPAL TUNNEL RELEASE Right 03/31/2013   Procedure: RIGHT CARPAL TUNNEL RELEASE;  Surgeon: Nicki ReaperGary R Kuzma, MD;  Location: [facility];  Service: Orthopedics;  Laterality: Right;  . ENDARTERECTOMY Right 05/03/2015   Procedure: RIGHT CAROTID ENDARTERECTOMY ;  Surgeon: Fransisco HertzBrian L Chen, MD;  [facility];  Service: Vascular;  Laterality: Right;  . NASAL SINUS SURGERY  1975  . PLANTAR FASCIA RELEASE  1968   both feet  . ROTATOR CUFF REPAIR Right 1998  . ROTATOR CUFF REPAIR Right   . TIBIA FRACTURE SURGERY Right 1975   rt  . TONSILLECTOMY  1951  . TUBAL LIGATION  1995   Social History   Occupational History  . Retired    Social History Main Topics  . Smoking status: Never Smoker  . Smokeless tobacco: Never Used  . Alcohol use 1.8 oz/week    3 Cans of beer  per week  . Drug use: No  . Sexual activity: Yes

## 2016-02-18 ENCOUNTER — Other Ambulatory Visit: Payer: Self-pay | Admitting: Internal Medicine

## 2016-02-19 ENCOUNTER — Other Ambulatory Visit: Payer: Self-pay | Admitting: Neurology

## 2016-02-21 ENCOUNTER — Other Ambulatory Visit (INDEPENDENT_AMBULATORY_CARE_PROVIDER_SITE_OTHER): Payer: Self-pay | Admitting: Specialist

## 2016-02-21 MED ORDER — MELOXICAM 15 MG PO TABS: 15.0000 mg | ORAL_TABLET | Freq: Every day | ORAL | 3 refills | Status: DC

## 2016-02-26 ENCOUNTER — Ambulatory Visit (INDEPENDENT_AMBULATORY_CARE_PROVIDER_SITE_OTHER): Payer: Medicare Other | Admitting: Adult Health

## 2016-02-26 ENCOUNTER — Encounter: Payer: Self-pay | Admitting: Adult Health

## 2016-02-26 VITALS — BP 129/58 | HR 64 | Ht 65.0 in | Wt 168.2 lb

## 2016-02-26 DIAGNOSIS — F329 Major depressive disorder, single episode, unspecified: Secondary | ICD-10-CM | POA: Diagnosis not present

## 2016-02-26 DIAGNOSIS — G523 Disorders of hypoglossal nerve: Secondary | ICD-10-CM | POA: Diagnosis not present

## 2016-02-26 DIAGNOSIS — F32A Depression, unspecified: Secondary | ICD-10-CM

## 2016-02-26 DIAGNOSIS — G8929 Other chronic pain: Secondary | ICD-10-CM

## 2016-02-26 DIAGNOSIS — M549 Dorsalgia, unspecified: Secondary | ICD-10-CM | POA: Diagnosis not present

## 2016-02-26 DIAGNOSIS — G519 Disorder of facial nerve, unspecified: Secondary | ICD-10-CM | POA: Diagnosis not present

## 2016-02-26 NOTE — Patient Instructions (Addendum)
Continue Gabapentin  If your symptoms worsen or you develop new symptoms please let us know.  If depression worsens please see your PCP

## 2016-02-26 NOTE — Progress Notes (Signed)
I have read the note, and I agree with the clinical assessment and plan.  Heather [name],Heather [name]   

## 2016-02-26 NOTE — Progress Notes (Signed)
PATIENT: RUBYANN LINGLE DOB: 1945-08-09  REASON FOR VISIT: follow up- carotid endarterectomy HISTORY FROM: patient  HISTORY OF PRESENT ILLNESS: Ms. 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 The patient returns today for follow-up. She states that she has continued to notice some improvement. She states that her smile is no longer crooked She states that her tongue still feels swollen when she is talking. She states that if she tries talking too much she will get tongue tied. She does have ongoing neck pain on the right side after the endarterectomy. She is using gabapentin 200 mg 3 times a day. She states that she did decrease her lunch dose 200 mg and increase her bedtime dose of 300 mg. She reports that she is resting better since she made this change. She continues to have low back pain and pain in her knees. Her orthopedist gave her Tylenol with Codeine. She reports that she was scheduled for back surgery but this was put on hold after the right carotid indirectly. She returns today for an evaluation.  HISTORY 09/25/15: 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 The patient has gained some improvement in tongue and facial strength, but she is still not back to her usual baseline. The patient also complained of some problems with a temporal headache, the headache is on both sides, which also has a sensation of a crick in her neck on the right side. The patient denies any pain going down the arms. The patient reports some word finding problems at times, she has difficulty carrying on conversation. If she talks a lot there is a fatigue factor with the face and tongue. The patient may  have occasional days without headache, but most days she does have some headache but the headache is not disabling. She has chronic low back pain as well. She has been placed on low-dose gabapentin taking 100 mg 3 times daily. This has helped some with the low back pain and with the headache. She is not too drowsy on the medication.   REVIEW OF SYSTEMS: Out of a complete 14 system review of symptoms, the patient complains only of the following symptoms, and all other reviewed systems are negative.  Memory loss, speech difficulty, back pain, neck pain, neck stiffness  ALLERGIES: Allergies  Allergen Reactions  . Sulfamethoxazole Swelling    Unspecified area of swelling    HOME MEDICATIONS: Outpatient Medications Prior to Visit  Medication Sig Dispense Refill  . acetaminophen (TYLENOL) 500 MG tablet Take 1,000 mg by mouth every 6 (six) hours as needed for moderate pain.    Marland Kitchen acetaminophen-codeine (TYLENOL #3) 300-30 MG tablet Take 1 tablet by mouth every 8 (eight) hours as needed for moderate pain. 30 tablet 0  . Ascorbic Acid (VITAMIN C) 1000 MG tablet Take 1,000 mg by mouth daily.    Marland Kitchen aspirin EC 81 MG tablet Take 1 tablet (81 mg total) by mouth daily.    Marland Kitchen atorvastatin (LIPITOR) 40 MG tablet Take 1 tablet (40 mg total) by mouth daily. 30 tablet 11  . busPIRone (BUSPAR) 7.5 MG tablet TAKE 1 TABLET (7.5 MG TOTAL) BY MOUTH 2 (TWO) TIMES DAILY. 180 tablet 1  . Cholecalciferol (VITAMIN D3) 1000 UNITS CAPS Take  1 capsule by mouth daily.     Marland Kitchen. gabapentin (NEURONTIN) 100 MG capsule TAKE 2 CAPSULES (200 MG TOTAL) BY MOUTH 3 (THREE) TIMES DAILY. 540 capsule 0  . loratadine (CLARITIN) 10 MG tablet Take 10 mg by mouth daily.      Marland Kitchen. losartan (COZAAR) 100 MG tablet Take 1 tablet (100 mg total) by mouth daily. 30 tablet 11  . meloxicam (MOBIC) 15 MG tablet Take 1 tablet (15 mg total) by mouth daily. 90 tablet 3  . metoprolol tartrate (LOPRESSOR) 25 MG tablet Take 1 tablet (25 mg total) by mouth 2 (two)  times daily. 180 tablet 3  . Multiple Vitamin (MULTIVITAMIN) tablet Take 1 tablet by mouth daily.      . sertraline (ZOLOFT) 50 MG tablet TAKE 2 TABLETS BY MOUTH EVERY DAY 180 tablet 2  . valACYclovir (VALTREX) 500 MG tablet TAKE 1 TABLET BY MOUTH DAILY**CAN INCREASE TO 2 TABS FOR 5 DAY IN THE EVENT OF RECURRENCE** 30 tablet 11   No facility-administered medications prior to visit.     PAST MEDICAL HISTORY: Past Medical History:  Diagnosis Date  . Anxiety   . Chicken pox   . Depression   . Headache 06/23/2015  . Heart murmur    MVP  . History of blood transfusion    1975 - leg fracture  . HOH (hard of hearing)    left  . Hypertension   . MVP (mitral valve prolapse)    echo 1986-mild  . 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 Shortness of breath dyspnea    WITH EXERTION    . Wears glasses     PAST SURGICAL HISTORY: Past Surgical History:  Procedure Laterality Date  . CARPAL TUNNEL RELEASE Right 03/31/2013   Procedure: RIGHT CARPAL TUNNEL RELEASE;  Surgeon: Nicki ReaperGary R Kuzma, MD;  Location: [facility];  Service: Orthopedics;  Laterality: Right;  . ENDARTERECTOMY Right 05/03/2015   Procedure: RIGHT CAROTID ENDARTERECTOMY ;  Surgeon: Fransisco HertzBrian L Chen, MD;  [facility];  Service: Vascular;  Laterality: Right;  . NASAL SINUS SURGERY  1975  . PLANTAR FASCIA RELEASE  1968   both feet  . ROTATOR CUFF REPAIR Right 1998  . ROTATOR CUFF REPAIR Right   . TIBIA FRACTURE SURGERY Right 1975   rt  . TONSILLECTOMY  1951  . TUBAL LIGATION  1995    FAMILY HISTORY: Family History  Problem Relation Age of Onset  . Adopted: Yes  . Osteoporosis Mother   . Heart disease Mother   . Hypertension Mother   . Hyperlipidemia Mother   . Stroke Mother   . Arthritis Mother   . Diabetes Father   . Heart attack Father   . Cancer Sister     breast  . Colon cancer Neg Hx     SOCIAL HISTORY: Social  History   Social History  . Marital status: Divorced    Spouse name: N/A  . Number of children: 1  . Years of education: 6712   Occupational History  . Retired    Social History Main Topics  . Smoking status: Never Smoker  . Smokeless tobacco: Never Used  . Alcohol use 1.8 oz/week    3 Cans of beer per week  . Drug use: No  . Sexual activity: Yes   Other Topics Concern  . Not on file   Social History Narrative   Lives at home w/ significant  other   Right-handed   Drinks 2 cups caffeine per day      PHYSICAL EXAM  Vitals:   02/26/16 0919  BP: (!) 129/58  Pulse: 64  Weight: 168 lb 3.2 oz (76.3 kg)  Height: 5\' 5"  (1.651 m)   Body mass index is 27.99 kg/m.   MMSE - Mini Mental State Exam 02/26/2016  Orientation to time 5  Orientation to Place 5  Registration 3  Attention/ Calculation 5  Recall 2  Language- name 2 objects 2  Language- repeat 1  Language- follow 3 step command 3  Language- read & follow direction 1  Write a sentence 1  Copy design 1  Total score 29  Some recent data might be hidden     Generalized: Well developed, in no acute distress   Neurological examination  Mentation: Alert oriented to time, place, history taking. Follows all commands speech and language fluent Cranial nerve II-XII: Pupils were equal round reactive to light. Extraocular movements were full, visual field were full on confrontational test. Facial sensation and strength were normal. Uvula tongue midline. Head turning and shoulder shrug  were normal and symmetric. No puff cheeks weakness noted. Motor: The motor testing reveals 5 over 5 strength of all 4 extremities. Good symmetric motor tone is noted throughout.  Sensory: Sensory testing is intact to soft touch on all 4 extremities. No evidence of extinction is noted.  Coordination: Cerebellar testing reveals good finger-nose-finger and heel-to-shin bilaterally.  Gait and station: Gait is normal.  Reflexes: Deep tendon  reflexes are symmetric and normal bilaterally.   DIAGNOSTIC DATA (LABS, IMAGING, TESTING) - I reviewed patient records, labs, notes, testing and imaging myself where available.  Lab Results  Component Value Date   WBC 8.4 05/04/2015   HGB 9.8 (L) 05/04/2015   HCT 28.8 (L) 05/04/2015   MCV 93.5 05/04/2015   PLT 195 05/04/2015      Component Value Date/Time   NA 140 07/27/2015 1016   K 4.8 07/27/2015 1016   CL 104 07/27/2015 1016   CO2 28 07/27/2015 1016   GLUCOSE 104 (H) 07/27/2015 1016   BUN 20 07/27/2015 1016   CREATININE 0.84 07/27/2015 1016   CALCIUM 9.6 07/27/2015 1016   PROT 5.9 (L) 06/05/2015 0825   ALBUMIN 4.0 06/05/2015 0825   AST 16 06/05/2015 0825   ALT 24 06/05/2015 0825   ALKPHOS 93 06/05/2015 0825   BILITOT 0.4 06/05/2015 0825   GFRNONAA >60 05/04/2015 0450   GFRAA >60 05/04/2015 0450   Lab Results  Component Value Date   CHOL 162 06/05/2015   HDL 63 06/05/2015   LDLCALC 77 06/05/2015   LDLDIRECT 132.0 02/21/2015   TRIG 111 06/05/2015   CHOLHDL 2.6 06/05/2015   Lab Results  Component Value Date   HGBA1C 5.8 02/21/2015   No results found for: [id number] Lab Results  Component Value Date   TSH 1.81 03/03/2013      ASSESSMENT AND PLAN 71 y.o. year old female  has a past medical history of Anxiety; Chicken pox; Depression; Headache (06/23/2015); Heart murmur; History of blood transfusion; HOH (hard of hearing); Hypertension; MVP (mitral valve prolapse); Primary genital herpes simplex infection (08/21/2012); Shortness of breath dyspnea; and Wears glasses. here with:  1. Facial neuropathy 2. Hypoglossal neuropathy 3. Chronic back pain 4. Depression  Overall the patient is doing well. She will continue taking gabapentin 200 mg in the morning, 100 mg at noon and 300 mg at bedtime. Patient is encouraged to continue regular  follow ups with her orthopedist in regards to back pain. Also advised that if her depression worsens she should make her PCP aware.  She voiced understanding. She will follow-up in 6 months or sooner if needed.   Butch Penny, MSN, NP-C 02/26/2016, 9:24 AM [facility] [address], [address] [place], [place] [address] [phone]

## 2016-03-05 ENCOUNTER — Other Ambulatory Visit (INDEPENDENT_AMBULATORY_CARE_PROVIDER_SITE_OTHER): Payer: Self-pay | Admitting: Specialist

## 2016-03-05 ENCOUNTER — Telehealth (INDEPENDENT_AMBULATORY_CARE_PROVIDER_SITE_OTHER): Payer: Self-pay | Admitting: Specialist

## 2016-03-05 DIAGNOSIS — M5136 Other intervertebral disc degeneration, lumbar region: Secondary | ICD-10-CM

## 2016-03-05 DIAGNOSIS — M48062 Spinal stenosis, lumbar region with neurogenic claudication: Secondary | ICD-10-CM

## 2016-03-05 MED ORDER — ACETAMINOPHEN-CODEINE #3 300-30 MG PO TABS: 1.0000 | ORAL_TABLET | Freq: Three times a day (TID) | ORAL | 0 refills | Status: DC | PRN

## 2016-03-05 NOTE — Telephone Encounter (Signed)
Patient called needing Rx refilled Acetaminophen-cod #3 tabs

## 2016-03-05 NOTE — Telephone Encounter (Signed)
Rx for tyl #3 prescribed and printed, will sign, can be called into her pharmacy

## 2016-03-05 NOTE — Telephone Encounter (Signed)
Advised rx was called in to her pharmacy

## 2016-03-05 NOTE — Telephone Encounter (Signed)
Patient called needing Rx refilled Acetaminophen-cod #3 tabs. The number to contact patient is [phone]

## 2016-03-06 ENCOUNTER — Other Ambulatory Visit: Payer: Self-pay | Admitting: Cardiovascular Disease

## 2016-04-08 ENCOUNTER — Telehealth (INDEPENDENT_AMBULATORY_CARE_PROVIDER_SITE_OTHER): Payer: Self-pay | Admitting: Specialist

## 2016-04-08 NOTE — Telephone Encounter (Signed)
Pt requests refill on Acetaminophen Codeine

## 2016-04-08 NOTE — Telephone Encounter (Signed)
Pt requests refill on Acetaminophen Codeine   [phone]

## 2016-04-10 ENCOUNTER — Other Ambulatory Visit (INDEPENDENT_AMBULATORY_CARE_PROVIDER_SITE_OTHER): Payer: Self-pay | Admitting: Specialist

## 2016-04-10 DIAGNOSIS — M5136 Other intervertebral disc degeneration, lumbar region: Secondary | ICD-10-CM

## 2016-04-10 DIAGNOSIS — M48062 Spinal stenosis, lumbar region with neurogenic claudication: Secondary | ICD-10-CM

## 2016-04-10 MED ORDER — ACETAMINOPHEN-CODEINE #3 300-30 MG PO TABS: 1.0000 | ORAL_TABLET | Freq: Three times a day (TID) | ORAL | 0 refills | Status: DC | PRN

## 2016-04-10 NOTE — Telephone Encounter (Signed)
Tylenol #3 prescribed, she is not a good operative candidate due to medical comorbities. jen

## 2016-04-11 ENCOUNTER — Other Ambulatory Visit (INDEPENDENT_AMBULATORY_CARE_PROVIDER_SITE_OTHER): Payer: Self-pay | Admitting: Specialist

## 2016-04-11 NOTE — Telephone Encounter (Signed)
[facility]

## 2016-04-11 NOTE — Telephone Encounter (Signed)
Tried to call but VM has not been set up--need to know which pharm to send rx to.

## 2016-04-11 NOTE — Telephone Encounter (Signed)
Called Tylenol #3 into pharm

## 2016-04-20 ENCOUNTER — Other Ambulatory Visit: Payer: Self-pay | Admitting: Cardiovascular Disease

## 2016-05-08 ENCOUNTER — Encounter (INDEPENDENT_AMBULATORY_CARE_PROVIDER_SITE_OTHER): Payer: Self-pay | Admitting: Specialist

## 2016-05-08 ENCOUNTER — Ambulatory Visit (INDEPENDENT_AMBULATORY_CARE_PROVIDER_SITE_OTHER): Payer: Medicare Other | Admitting: Specialist

## 2016-05-08 VITALS — BP 121/54 | HR 59 | Ht 65.0 in | Wt 166.0 lb

## 2016-05-08 DIAGNOSIS — M1611 Unilateral primary osteoarthritis, right hip: Secondary | ICD-10-CM | POA: Diagnosis not present

## 2016-05-08 DIAGNOSIS — M25562 Pain in left knee: Secondary | ICD-10-CM

## 2016-05-08 DIAGNOSIS — M1712 Unilateral primary osteoarthritis, left knee: Secondary | ICD-10-CM

## 2016-05-08 DIAGNOSIS — M48062 Spinal stenosis, lumbar region with neurogenic claudication: Secondary | ICD-10-CM | POA: Diagnosis not present

## 2016-05-08 DIAGNOSIS — G8929 Other chronic pain: Secondary | ICD-10-CM | POA: Diagnosis not present

## 2016-05-08 DIAGNOSIS — M25561 Pain in right knee: Secondary | ICD-10-CM | POA: Diagnosis not present

## 2016-05-08 DIAGNOSIS — M5136 Other intervertebral disc degeneration, lumbar region: Secondary | ICD-10-CM

## 2016-05-08 MED ORDER — BUPIVACAINE HCL 0.25 % IJ SOLN
4.0000 mL | INTRAMUSCULAR | Status: AC | PRN
  Administered 2016-05-08: 4 mL via INTRA_ARTICULAR

## 2016-05-08 MED ORDER — METHYLPREDNISOLONE ACETATE 40 MG/ML IJ SUSP
40.0000 mg | INTRAMUSCULAR | Status: AC | PRN
  Administered 2016-05-08: 40 mg via INTRA_ARTICULAR

## 2016-05-08 MED ORDER — ACETAMINOPHEN-CODEINE #3 300-30 MG PO TABS: 1.0000 | ORAL_TABLET | Freq: Three times a day (TID) | ORAL | 0 refills | Status: DC | PRN

## 2016-05-08 NOTE — Patient Instructions (Addendum)
  Knee is suffering from osteoarthritis, only real proven treatments are Weight loss, NSIADs like meloxicam and exercise. Well padded shoes help. Ice the knee 2-3 times a day 15-20 mins at a time. Avoid bending, stooping and avoid lifting weights greater than 10 lbs. Avoid prolong standing and walking. Walking in a swimming pool or stationary bike would be good way to exercise.  Avoid frequent bending and stooping  No lifting greater than 10 lbs. May use ice or moist heat for pain. Weight loss is of benefit. Handicap license is approved. Taper off the gabapentin by decreasing to  at night and  in AM and at noon, then after 5 days decrease to  at night and in the AM then after 5 days take only at night  Then after 5 days discontinue use at night.

## 2016-05-08 NOTE — Progress Notes (Signed)
Office Visit Note   Patient: Heather [name]           Date of Birth: 06-06-1945           MRN: [id number] Visit Date: 05/08/2016              Requested by: Lorre Munroe, NP [address] [place], [place] [address] PCP: Nicki Reaper, NP   Assessment & Plan: Visit Diagnoses:  1. Chronic pain of both knees   2. Unilateral primary osteoarthritis, left knee   3. Unilateral primary osteoarthritis, right hip   4. Spinal stenosis of lumbar region with neurogenic claudication     Plan: Knee is suffering from osteoarthritis, only real proven treatments are Weight loss, NSIADs like meloxicam and exercise. Well padded shoes help. Ice the knee 2-3 times a day 15-20 mins at a time. Avoid bending, stooping and avoid lifting weights greater than 10 lbs. Avoid prolong standing and walking. Avoid frequent bending and stooping  No lifting greater than 10 lbs. May use ice or moist heat for pain. Weight loss is of benefit. Handicap license is approved. Taper off the gabapentin by decreasing to  at night and  in AM and at noon, then after 5 days decrease to  at night and in the AM then after 5 days take only at night  Then after 5 days discontinue use at night.    Follow-Up Instructions: Return in about 8 weeks (around 07/03/2016).   Orders:  No orders of the defined types were placed in this encounter.  No orders of the defined types were placed in this encounter.     Procedures: Large Joint Inj Date/Time: 05/08/2016 11:16 AM Performed by: Kerrin Champagne Authorized by: Kerrin Champagne   Consent Given by:  Patient Site marked: the procedure site was marked   Timeout: prior to procedure the correct patient, procedure, and site was verified   Indications:  Pain and joint swelling Location:  Knee Site:  L knee Prep: patient was prepped and draped in usual sterile fashion   Needle Size:  25 G Needle Length:  1.5 inches Approach:  Anterolateral Ultrasound  Guidance: No   Fluoroscopic Guidance: No   Arthrogram: No   Medications:  40 mg methylPREDNISolone acetate 40 MG/ML; 4 mL bupivacaine 0.25 % Aspiration Attempted: No   Patient tolerance:  Patient tolerated the procedure well with no immediate complications  Bandaid applied.      Clinical Data: No additional findings.   Subjective: Chief Complaint  Patient presents with  . Lower Back - Follow-up    71 year old female with history of severe lumbar spinal stenosis, has had right carotid endarterectomy 04/2015 for atherosclerotic peripheral vascular disease. Was considering lumbar laminectomy for stenosis in the past but this has been put off due to carotid disease and post CAE left body paresis, still notes some dysarthria and difficulty with speech Dysphasia. Some SOB with prolong walking, needs to see her cardiologist sooner than later. No bowel or bladder difficulty. Not able to walk a mile, can grocery shop leans on the Grocery cart. Sitting relieves the pain and leg fatigue. Knee pain left with squatting and kneeling, prolong standing and walking. Stair climbing is difficult with the left leg. Pain greatest in the left knee with swelling and stiffness, AM stiffness, pain with weight bearing.     Review of Systems  HENT: Positive for sinus pain and sneezing.   Eyes: Negative.   Respiratory: Positive for  cough, shortness of breath and wheezing.   Cardiovascular: Positive for leg swelling.  Gastrointestinal: Negative.   Endocrine: Negative.   Genitourinary: Negative.   Musculoskeletal: Positive for back pain and gait problem.  Skin: Negative.   Allergic/Immunologic: Negative.   Neurological: Positive for facial asymmetry, weakness and numbness.  Hematological: Negative.   Psychiatric/Behavioral: Negative.      Objective: Vital Signs: BP (!) 121/54 (BP Location: Left Arm, Patient Position: Sitting)   Pulse (!) 59   Ht  (1.651 m)   Wt 166 lb (75.3 kg)   LMP  01/08/2012   BMI 27.62 kg/m   Physical Exam  Constitutional: She is oriented to person, place, and time. She appears well-developed and well-nourished.  HENT:  Head: Normocephalic and atraumatic.  Eyes: EOM are normal. Pupils are equal, round, and reactive to light.  Neck: Normal range of motion. Neck supple.  Pulmonary/Chest: Effort normal and breath sounds normal.  Abdominal: Soft. Bowel sounds are normal.  Musculoskeletal:       Left knee: She exhibits effusion.  Neurological: She is [name] and oriented to person, place, and time.  Skin: Skin is warm and dry.  Psychiatric: She has a normal mood and affect. Her behavior is normal. Judgment and thought content normal.    Right Knee Exam  Right knee exam is normal.  Muscle Strength   The patient has normal right knee strength.  Other  Swelling: mild   Left Knee Exam   Tenderness  The patient is experiencing tenderness in the patella, lateral joint line and medial joint line.  Range of Motion  Extension:  -5 abnormal  Flexion:  120 normal   Muscle Strength   The patient has normal left knee strength.  Tests  McMurray:  Medial - negative Lateral - negative Lachman:  Anterior - negative    Posterior - negative Drawer:       Anterior - negative     Posterior - negative Varus: negative Valgus: negative Pivot Shift: negative Patellar Apprehension: negative  Other  Erythema: absent Scars: absent Sensation: normal Pulse: absent Swelling: mild Effusion: effusion present   Back Exam   Tenderness  The patient is experiencing tenderness in the lumbar.  Range of Motion  Extension: abnormal  Flexion: abnormal  Lateral Bend Right: abnormal  Lateral Bend Left: abnormal  Rotation Right: abnormal  Rotation Left: abnormal   Muscle Strength  Right Quadriceps:  5/5  Left Quadriceps:  5/5  Right Hamstrings:  5/5  Left Hamstrings:  5/5   Tests  Straight leg raise right: negative Straight leg raise left:  negative  Reflexes  Patellar: normal Achilles: normal Babinski's sign: normal   Other  Toe Walk: normal Heel Walk: normal Sensation: decreased Gait: antalgic  Erythema: no back redness Scars: absent      Specialty Comments:  No specialty comments available.  Imaging: No results found.   PMFS History: Patient Active Problem List   Diagnosis Date Noted  . Other headache syndrome 09/25/2015  . 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 Carotid disease, bilateral (HCC) 03/03/2015  . Chronic back pain 03/03/2014  . Seasonal allergies 03/03/2014  . Genital herpes 03/03/2014  . Hyperlipidemia 03/03/2013  . Prediabetes 03/03/2013  . Anxiety and depression 12/11/2011  . Essential hypertension 11/19/2006  . MITRAL VALVE PROLAPSE 11/19/2006   Past Medical History:  Diagnosis Date  . Anxiety   . Chicken pox   . Depression   . Headache 06/23/2015  . Heart murmur  MVP  . History of blood transfusion    1975 - leg fracture  . HOH (hard of hearing)    left  . Hypertension   . MVP (mitral valve prolapse)    echo 1986-mild  . 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 Shortness of breath dyspnea    WITH EXERTION    . Wears glasses     Family History  Problem Relation Age of Onset  . Adopted: Yes  . Osteoporosis Mother   . Heart disease Mother   . Hypertension Mother   . Hyperlipidemia Mother   . Stroke Mother   . Arthritis Mother   . Diabetes Father   . Heart attack Father   . Cancer Sister     breast  . Colon cancer Neg Hx     Past Surgical History:  Procedure Laterality Date  . CARPAL TUNNEL RELEASE Right 03/31/2013   Procedure: RIGHT CARPAL TUNNEL RELEASE;  Surgeon: Nicki Reaper, MD;  Location: [facility];  Service: Orthopedics;  Laterality: Right;  . ENDARTERECTOMY Right 05/03/2015   Procedure: RIGHT CAROTID ENDARTERECTOMY ;   Surgeon: Fransisco Hertz, MD;  Location: [facility] OR;  Service: Vascular;  Laterality: Right;  . NASAL SINUS SURGERY  1975  . PLANTAR FASCIA RELEASE  1968   both feet  . ROTATOR CUFF REPAIR Right 1998  . ROTATOR CUFF REPAIR Right   . TIBIA FRACTURE SURGERY Right 1975   rt  . TONSILLECTOMY  1951  . TUBAL LIGATION  1995   Social History   Occupational History  . Retired    Social History Main Topics  . Smoking status: Never Smoker  . Smokeless tobacco: Never Used  . Alcohol use 1.8 oz/week    3 Cans of beer per week  . Drug use: No  . Sexual activity: Yes

## 2016-05-19 ENCOUNTER — Other Ambulatory Visit: Payer: Self-pay | Admitting: Neurology

## 2016-05-28 ENCOUNTER — Ambulatory Visit (INDEPENDENT_AMBULATORY_CARE_PROVIDER_SITE_OTHER): Payer: Medicare Other | Admitting: Internal Medicine

## 2016-05-28 ENCOUNTER — Encounter: Payer: Self-pay | Admitting: Internal Medicine

## 2016-05-28 VITALS — BP 124/78 | HR 65 | Temp 98.0°F | Wt 166.5 lb

## 2016-05-28 DIAGNOSIS — H0011 Chalazion right upper eyelid: Secondary | ICD-10-CM

## 2016-05-28 NOTE — Patient Instructions (Signed)
Chalazion A chalazion is a swelling or lump on the eyelid. It can affect the upper or lower eyelid. What are the causes? This condition may be caused by:  Long-lasting (chronic) inflammation of the eyelid glands.  A blocked oil gland in the eyelid. What are the signs or symptoms? Symptoms of this condition include:  A swelling on the eyelid. The swelling may spread to areas around the eye.  A hard lump on the eyelid. This lump may make it hard to see out of the eye. How is this diagnosed? This condition is diagnosed with an examination of the eye. How is this treated? This condition is treated by applying a warm compress to the eyelid. If the condition does not improve after two days, it may be treated with:  Surgery.  Medicine that is injected into the chalazion by a health care provider.  Medicine that is applied to the eye. Follow these instructions at home:  Do not touch the chalazion.  Do not try to remove the pus, such as by squeezing the chalazion or sticking it with a pin or needle.  Do not rub your eyes.  Wash your hands often. Dry your hands with a clean towel.  Keep your face, scalp, and eyebrows clean.  Avoid wearing eye makeup.  Apply a warm, moist compress to the eyelid 4-6 times a day for 10-15 minutes at a time. This will help to open any blocked glands and help to reduce redness and swelling.  Apply over-the-counter and prescription medicines only as told by your health care provider.  If the chalazion does not break open (rupture) on its own in a month, return to your health care provider.  Keep all follow-up appointments as told by your health care provider. This is important. Contact a health care provider if:  Your eyelid has not improved in 4 weeks.  Your eyelid is getting worse.  You have a fever.  The chalazion does not rupture on its own with home treatment in a month. Get help right away if:  You have pain in your eye.  Your vision  changes.  The chalazion becomes painful or red  The chalazion gets bigger. This information is not intended to replace advice given to you by your health care provider. Make sure you discuss any questions you have with your health care provider. Document Released: 01/12/2000 Document Revised: 06/22/2015 Document Reviewed: 05/09/2014 Elsevier Interactive Patient Education  2017 Elsevier Inc.  

## 2016-05-28 NOTE — Progress Notes (Signed)
Subjective:    Patient ID: Heather [name], female    DOB: [birth year], 71 y.o.   MRN: [id number]  HPI  Pt presents to the clinic today with c/o with c/o a bump on her right upper eyelid. She noticed this 1 week. The bump has been itching, and is also painful. She has not noticed any drainage from the area. She denies any visual changes. She has tried saline rinse without much relief.   Review of Systems      Past Medical History:  Diagnosis Date  . Anxiety   . Chicken pox   . Depression   . Headache 06/23/2015  . Heart murmur    MVP  . History of blood transfusion    1975 - leg fracture  . HOH (hard of hearing)    left  . Hypertension   . MVP (mitral valve prolapse)    echo 1986-mild  . 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 Shortness of breath dyspnea    WITH EXERTION    . Wears glasses     Current Outpatient Prescriptions  Medication Sig Dispense Refill  . acetaminophen (TYLENOL) 500 MG tablet Take 1,000 mg by mouth every 6 (six) hours as needed for moderate pain.    Marland Kitchen acetaminophen-codeine (TYLENOL #3) 300-30 MG tablet Take 1 tablet by mouth every 8 (eight) hours as needed for moderate pain. 40 tablet 0  . Ascorbic Acid (VITAMIN C) 1000 MG tablet Take 1,000 mg by mouth daily.    Marland Kitchen aspirin EC 81 MG tablet Take 1 tablet (81 mg total) by mouth daily.    Marland Kitchen atorvastatin (LIPITOR) 40 MG tablet TAKE 1 TABLET (40 MG TOTAL) BY MOUTH DAILY. 30 tablet 11  . busPIRone (BUSPAR) 7.5 MG tablet TAKE 1 TABLET (7.5 MG TOTAL) BY MOUTH 2 (TWO) TIMES DAILY. 180 tablet 1  . Cholecalciferol (VITAMIN D3) 1000 UNITS CAPS Take 1 capsule by mouth daily.     Marland Kitchen loratadine (CLARITIN) 10 MG tablet Take 10 mg by mouth daily.      Marland Kitchen losartan (COZAAR) 100 MG tablet TAKE 1 TABLET (100 MG TOTAL) BY MOUTH DAILY. 30 tablet 8  . meloxicam (MOBIC) 15 MG tablet Take 1 tablet (15 mg total) by mouth daily. 90 tablet 3  .  metoprolol tartrate (LOPRESSOR) 25 MG tablet Take 1 tablet (25 mg total) by mouth 2 (two) times daily. 180 tablet 3  . Multiple Vitamin (MULTIVITAMIN) tablet Take 1 tablet by mouth daily.      . sertraline (ZOLOFT) 50 MG tablet TAKE 2 TABLETS BY MOUTH EVERY DAY 180 tablet 2  . valACYclovir (VALTREX) 500 MG tablet TAKE 1 TABLET BY MOUTH DAILY**CAN INCREASE TO 2 TABS FOR 5 DAY IN THE EVENT OF RECURRENCE** 30 tablet 11   No current facility-administered medications for this visit.     Allergies  Allergen Reactions  . Sulfamethoxazole Swelling    Unspecified area of swelling    Family History  Problem Relation Age of Onset  . Adopted: Yes  . Osteoporosis Mother   . Heart disease Mother   . Hypertension Mother   . Hyperlipidemia Mother   . Stroke Mother   . Arthritis Mother   . Diabetes Father   . Heart attack Father   . Cancer Sister     breast  . Colon cancer Neg Hx     Social History   Social History  . Marital status:  Divorced    Spouse name: N/A  . Number of children: 1  . Years of education: 20   Occupational History  . Retired    Social History Main Topics  . Smoking status: Never Smoker  . Smokeless tobacco: Never Used  . Alcohol use 1.8 oz/week    3 Cans of beer per week  . Drug use: No  . Sexual activity: Yes   Other Topics Concern  . Not on file   Social History Narrative   Lives at home w/ significant other   Right-handed   Drinks 2 cups caffeine per day     Constitutional: Denies fever, malaise, fatigue, headache or abrupt weight changes.  HEENT: Denies eye pain, eye redness, ear pain, ringing in the ears, wax buildup, runny nose, nasal congestion, bloody nose, or sore throat. Skin: Pt reports lesion of right upper eyelid. Denies ulcercations.    No other specific complaints in a complete review of systems (except as listed in HPI above).  Objective:   Physical Exam  BP 124/78   Pulse 65   Temp 98 F (36.7 C) (Oral)   Wt 166 lb 8 oz  (75.5 kg)   LMP 01/08/2012   SpO2 98%   BMI 27.71 kg/m  Wt Readings from Last 3 Encounters:  05/28/16 166 lb 8 oz (75.5 kg)  05/08/16 166 lb (75.3 kg)  02/26/16 168 lb 3.2 oz (76.3 kg)    General: Appears her stated age, in NAD. Skin: 1 cm nodular lesion noted on right upper eyelid. HEENT: Eyes: sclera white, no icterus, conjunctiva pink;   BMET    Component Value Date/Time   NA 140 07/27/2015 1016   K 4.8 07/27/2015 1016   CL 104 07/27/2015 1016   CO2 28 07/27/2015 1016   GLUCOSE 104 (H) 07/27/2015 1016   BUN 20 07/27/2015 1016   CREATININE 0.84 07/27/2015 1016   CALCIUM 9.6 07/27/2015 1016   GFRNONAA >60 05/04/2015 0450   GFRAA >60 05/04/2015 0450    Lipid Panel     Component Value Date/Time   CHOL 162 06/05/2015 0825   TRIG 111 06/05/2015 0825   HDL 63 06/05/2015 0825   CHOLHDL 2.6 06/05/2015 0825   VLDL 22 06/05/2015 0825   LDLCALC 77 06/05/2015 0825    CBC    Component Value Date/Time   WBC 8.4 05/04/2015 0450   RBC 3.08 (L) 05/04/2015 0450   HGB 9.8 (L) 05/04/2015 0450   HCT 28.8 (L) 05/04/2015 0450   PLT 195 05/04/2015 0450   MCV 93.5 05/04/2015 0450   MCH 31.8 05/04/2015 0450   MCHC 34.0 05/04/2015 0450   RDW 13.1 05/04/2015 0450   LYMPHSABS 2.0 08/16/2014 1430   MONOABS 0.7 08/16/2014 1430   EOSABS 0.5 08/16/2014 1430   BASOSABS 0.0 08/16/2014 1430    Hgb A1C Lab Results  Component Value Date   HGBA1C 5.8 02/21/2015            Assessment & Plan:   Chalazion, Right Upper Eyelid:  Warm Compresses TID Referral to optho for possible I&D  RTC as needed or if symptoms persist or worsen BAITY, REGINA, NP

## 2016-05-29 DIAGNOSIS — H0011 Chalazion right upper eyelid: Secondary | ICD-10-CM | POA: Diagnosis not present

## 2016-06-09 ENCOUNTER — Other Ambulatory Visit: Payer: Self-pay | Admitting: Cardiovascular Disease

## 2016-06-10 ENCOUNTER — Telehealth (INDEPENDENT_AMBULATORY_CARE_PROVIDER_SITE_OTHER): Payer: Self-pay | Admitting: Radiology

## 2016-06-10 ENCOUNTER — Other Ambulatory Visit (INDEPENDENT_AMBULATORY_CARE_PROVIDER_SITE_OTHER): Payer: Self-pay | Admitting: Specialist

## 2016-06-10 DIAGNOSIS — M1611 Unilateral primary osteoarthritis, right hip: Secondary | ICD-10-CM

## 2016-06-10 DIAGNOSIS — M5136 Other intervertebral disc degeneration, lumbar region: Secondary | ICD-10-CM

## 2016-06-10 DIAGNOSIS — M48062 Spinal stenosis, lumbar region with neurogenic claudication: Secondary | ICD-10-CM

## 2016-06-10 MED ORDER — ACETAMINOPHEN-CODEINE #3 300-30 MG PO TABS: 1.0000 | ORAL_TABLET | Freq: Three times a day (TID) | ORAL | 0 refills | Status: DC | PRN

## 2016-06-10 NOTE — Telephone Encounter (Signed)
Dr. Otelia SergeantNitka printed refill prescription for patient's Tylenol #3 Take one tablet by mouth every 8 hours as needed for moderate pain #40 with no refills.  [facility]

## 2016-06-10 NOTE — Telephone Encounter (Signed)
Patient called needing Rx refilled Tylenol #3. The number to contact patient is [phone]

## 2016-06-11 NOTE — Telephone Encounter (Signed)
Heather [name] called in 

## 2016-06-12 DIAGNOSIS — H2513 Age-related nuclear cataract, bilateral: Secondary | ICD-10-CM | POA: Diagnosis not present

## 2016-06-28 ENCOUNTER — Ambulatory Visit: Payer: Medicare Other | Admitting: Vascular Surgery

## 2016-06-28 ENCOUNTER — Encounter (HOSPITAL_COMMUNITY): Payer: Medicare Other

## 2016-07-03 ENCOUNTER — Ambulatory Visit (INDEPENDENT_AMBULATORY_CARE_PROVIDER_SITE_OTHER): Payer: Medicare Other | Admitting: Specialist

## 2016-07-03 ENCOUNTER — Encounter (INDEPENDENT_AMBULATORY_CARE_PROVIDER_SITE_OTHER): Payer: Self-pay | Admitting: Specialist

## 2016-07-03 VITALS — BP 139/62 | HR 59 | Ht 65.0 in | Wt 166.0 lb

## 2016-07-03 DIAGNOSIS — M1611 Unilateral primary osteoarthritis, right hip: Secondary | ICD-10-CM | POA: Diagnosis not present

## 2016-07-03 DIAGNOSIS — M5136 Other intervertebral disc degeneration, lumbar region: Secondary | ICD-10-CM

## 2016-07-03 DIAGNOSIS — M48062 Spinal stenosis, lumbar region with neurogenic claudication: Secondary | ICD-10-CM | POA: Diagnosis not present

## 2016-07-03 MED ORDER — ACETAMINOPHEN 500 MG PO TABS: 1000.0000 mg | ORAL_TABLET | Freq: Four times a day (QID) | ORAL | 6 refills | Status: DC | PRN

## 2016-07-03 MED ORDER — ACETAMINOPHEN-CODEINE #3 300-30 MG PO TABS: 1.0000 | ORAL_TABLET | Freq: Three times a day (TID) | ORAL | 0 refills | Status: DC | PRN

## 2016-07-03 MED ORDER — LIDOCAINE HCL 2 % EX GEL: 1.0000 "application " | CUTANEOUS | 6 refills | Status: DC | PRN

## 2016-07-03 NOTE — Patient Instructions (Addendum)
Avoid bending, stooping and avoid lifting weights greater than 10 lbs. Avoid prolong standing and walking. Avoid frequent bending and stooping  No lifting greater than 10 lbs. May use ice or moist heat for pain. Weight loss is of benefit. Handicap license is approved.   Right hip is suffering from osteoarthritis, only real proven treatments are Weight loss, NSIADs are to be avoided due to recent carotid surgery and CVA,  but tylenol arthritis strength up to three times or four times a day is a good medication and exercise. Well padded shoes help. Ice the hip 2-3 times a day 15-20 mins at a time.

## 2016-07-03 NOTE — Progress Notes (Signed)
Office Visit Note   Patient: Heather [name]           Date of Birth: 1946/01/15           MRN: [id number] Visit Date: 07/03/2016              Requested by: Lorre Munroe, NP [address] [place], [place] [address] PCP: Lorre Munroe, NP   Assessment & Plan: Visit Diagnoses:  1. Spinal stenosis of lumbar region with neurogenic claudication   2. Unilateral primary osteoarthritis, right hip     Plan:Avoid bending, stooping and avoid lifting weights greater than 10 lbs. Avoid prolong standing and walking. Avoid frequent bending and stooping  No lifting greater than 10 lbs. May use ice or moist heat for pain. Weight loss is of benefit. Handicap license is approved.   Right hip is suffering from osteoarthritis, only real proven treatments are Weight loss, NSIADs are to be avoided due to recent carotid surgery and CVA,  but tylenol arthritis strength up to three times or four times a day is a good medication and exercise. Well padded shoes help. Ice the hip 2-3 times a day 15-20 mins at a time  Follow-Up Instructions: No Follow-up on file.   Orders:  No orders of the defined types were placed in this encounter.  No orders of the defined types were placed in this encounter.     Procedures: No procedures performed   Clinical Data: No additional findings.   Subjective: Chief Complaint  Patient presents with  . Lower Back - Follow-up, Pain  . Right Knee - Follow-up, Pain  . Left Knee - Follow-up, Pain    HPI  Review of Systems  HENT: Positive for postnasal drip, rhinorrhea, sinus pain and sinus pressure.   Eyes: Positive for redness and itching.  Respiratory: Positive for shortness of breath and wheezing.   Cardiovascular: Negative.  Negative for leg swelling.  Gastrointestinal: Negative.   Endocrine: Negative.   Genitourinary: Negative.   Musculoskeletal: Positive for arthralgias, back pain and gait problem.  Skin: Negative.     Allergic/Immunologic: Negative.   Neurological: Positive for weakness and numbness.  Hematological: Negative.   Psychiatric/Behavioral: Negative.      Objective: Vital Signs: BP 139/62 (BP Location: Left Arm, Patient Position: Sitting)   Pulse (!) 59   Ht 5\' 5"  (1.651 m)   Wt 166 lb (75.3 kg)   LMP 01/08/2012   BMI 27.62 kg/m   Physical Exam  Constitutional: She is oriented to person, place, and time. She appears well-developed and well-nourished.  HENT:  Head: Normocephalic and atraumatic.  Eyes: EOM are normal. Pupils are equal, round, and reactive to light.  Neck: Normal range of motion. Neck supple.  Pulmonary/Chest: Effort normal and breath sounds normal.  Abdominal: Soft. Bowel sounds are normal.  Neurological: She is [name] and oriented to person, place, and time.  Skin: Skin is warm and dry.  Psychiatric: She has a normal mood and affect. Her behavior is normal. Judgment and thought content normal.    Right Hip Exam  Right hip exam is normal.   Tenderness  The patient is experiencing tenderness in the lateral and anterior.  Range of Motion  Internal Rotation:  15 normal  External Rotation: 20  Adduction: normal   Muscle Strength  The patient has normal right hip strength.  Tests  FABER: positive Ober: positive  Other  Erythema: absent Scars: absent Sensation: normal Pulse: present   Left Hip  Exam  Left hip exam is normal.  Tenderness  The patient is experiencing tenderness in the greater trochanter and anterior.  Range of Motion  The patient has normal left hip ROM.  Muscle Strength  The patient has normal left hip strength.    Back Exam   Tenderness  The patient is experiencing tenderness in the lumbar.  Range of Motion  Extension: normal  Flexion: abnormal  Lateral Bend Right: normal  Lateral Bend Left: normal  Rotation Right: normal  Rotation Left: normal   Muscle Strength  Right Quadriceps:  4/5  Left Quadriceps:  4/5   Right Hamstrings:  4/5  Left Hamstrings:  4/5   Tests  Straight leg raise right: negative Straight leg raise left: negative  Reflexes  Patellar: Hyporeflexic Achilles: Hyporeflexic Babinski's sign: normal   Other  Toe Walk: normal Heel Walk: normal Sensation: normal Gait: abductor lurch  Erythema: no back redness Scars: absent      Specialty Comments:  No specialty comments available.  Imaging: No results found.   PMFS History: Patient Active Problem List   Diagnosis Date Noted  . Unilateral primary osteoarthritis, right hip 07/03/2016  . Spinal stenosis of lumbar region with neurogenic claudication 07/03/2016  . Other headache syndrome 09/25/2015  . 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 Carotid disease, bilateral (HCC) 03/03/2015  . Chronic back pain 03/03/2014  . Seasonal allergies 03/03/2014  . Genital herpes 03/03/2014  . Hyperlipidemia 03/03/2013  . Prediabetes 03/03/2013  . Anxiety and depression 12/11/2011  . Essential hypertension 11/19/2006  . MITRAL VALVE PROLAPSE 11/19/2006   Past Medical History:  Diagnosis Date  . Anxiety   . Chicken pox   . Depression   . Headache 06/23/2015  . Heart murmur    MVP  . History of blood transfusion    1975 - leg fracture  . HOH (hard of hearing)    left  . Hypertension   . MVP (mitral valve prolapse)    echo 1986-mild  . 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 Shortness of breath dyspnea    WITH EXERTION    . Wears glasses     Family History  Problem Relation Age of Onset  . Adopted: Yes  . Osteoporosis Mother   . Heart disease Mother   . Hypertension Mother   . Hyperlipidemia Mother   . Stroke Mother   . Arthritis Mother   . Diabetes Father   . Heart attack Father   . Cancer Sister        breast  . Colon cancer Neg Hx     Past Surgical History:  Procedure Laterality  Date  . CARPAL TUNNEL RELEASE Right 03/31/2013   Procedure: RIGHT CARPAL TUNNEL RELEASE;  Surgeon: Nicki ReaperGary R Kuzma, MD;  Location: [facility];  Service: Orthopedics;  Laterality: Right;  . ENDARTERECTOMY Right 05/03/2015   Procedure: RIGHT CAROTID ENDARTERECTOMY ;  Surgeon: Fransisco HertzBrian L Chen, MD;  [facility];  Service: Vascular;  Laterality: Right;  . NASAL SINUS SURGERY  1975  . PLANTAR FASCIA RELEASE  1968   both feet  . ROTATOR CUFF REPAIR Right 1998  . ROTATOR CUFF REPAIR Right   . TIBIA FRACTURE SURGERY Right 1975   rt  . TONSILLECTOMY  1951  . TUBAL LIGATION  1995   Social History   Occupational History  . Retired    Social History Main Topics  .  Smoking status: Never Smoker  . Smokeless tobacco: Never Used  . Alcohol use 1.8 oz/week    3 Cans of beer per week  . Drug use: No  . Sexual activity: Yes

## 2016-07-03 NOTE — Addendum Note (Signed)
Addended by: Vira BrownsNITKA, JAMES on: 07/03/2016 10:09 AM   Modules accepted: Orders

## 2016-08-04 ENCOUNTER — Other Ambulatory Visit: Payer: Self-pay | Admitting: Internal Medicine

## 2016-08-07 ENCOUNTER — Encounter: Payer: Self-pay | Admitting: Vascular Surgery

## 2016-08-12 ENCOUNTER — Other Ambulatory Visit (INDEPENDENT_AMBULATORY_CARE_PROVIDER_SITE_OTHER): Payer: Self-pay | Admitting: Specialist

## 2016-08-12 DIAGNOSIS — M1611 Unilateral primary osteoarthritis, right hip: Secondary | ICD-10-CM

## 2016-08-12 DIAGNOSIS — M48062 Spinal stenosis, lumbar region with neurogenic claudication: Secondary | ICD-10-CM

## 2016-08-12 DIAGNOSIS — M5136 Other intervertebral disc degeneration, lumbar region: Secondary | ICD-10-CM

## 2016-08-12 NOTE — Progress Notes (Signed)
Established Carotid Patient   History of Present Illness   Heather [name] is a 71 y.o. ([birth year]) female who presents with chief complaint: routine follow up.  Prior procedure was a R CEA (05/03/15) complicated by resolving hypoglossal and facial nerve palsy.  Swallow is mostly at baseline at this point.  Hyperesthesia of incision is resolved.  Facial asx is resolved.  Previous carotid studies demonstrated: RICA >80% stenosis, LICA 50% stenosis.  Patient has no history of TIA or stroke symptom.  The patient has never had amaurosis fugax or monocular blindness.  The patient has never had facial drooping or hemiplegia.  The patient has never had receptive or expressive aphasia.    The patient's PMH, PSH, SH, and FamHx are unchanged from 10/20/15.  Current Outpatient Prescriptions  Medication Sig Dispense Refill  . acetaminophen (TYLENOL) 500 MG tablet Take 2 tablets (1,000 mg total) by mouth every 6 (six) hours as needed for moderate pain. 50 tablet 6  . acetaminophen-codeine (TYLENOL #3) 300-30 MG tablet Take 1 tablet by mouth every 8 (eight) hours as needed for moderate pain. 40 tablet 0  . Ascorbic Acid (VITAMIN C) 1000 MG tablet Take 1,000 mg by mouth daily.    Marland Kitchen aspirin EC 81 MG tablet Take 1 tablet (81 mg total) by mouth daily.    Marland Kitchen atorvastatin (LIPITOR) 40 MG tablet TAKE 1 TABLET (40 MG TOTAL) BY MOUTH DAILY. 30 tablet 11  . busPIRone (BUSPAR) 7.5 MG tablet Take 1 tablet (7.5 mg total) by mouth 2 (two) times daily. MUST SCHEDULE ANNUAL PHYSICAL 180 tablet 0  . Cholecalciferol (VITAMIN D3) 1000 UNITS CAPS Take 1 capsule by mouth daily.     Marland Kitchen lidocaine (XYLOCAINE) 2 % jelly Apply 1 application topically as needed. Apply to the skin over the knees and hips up to BID 30 mL 6  . loratadine (CLARITIN) 10 MG tablet Take 10 mg by mouth daily.      Marland Kitchen losartan (COZAAR) 100 MG tablet TAKE 1 TABLET (100 MG TOTAL) BY MOUTH DAILY. 30 tablet 8  . meloxicam (MOBIC) 15 MG tablet Take 1 tablet (15  mg total) by mouth daily. 90 tablet 3  . metoprolol tartrate (LOPRESSOR) 25 MG tablet TAKE 1 TABLET (25 MG TOTAL) BY MOUTH 2 (TWO) TIMES DAILY. 180 tablet 1  . Multiple Vitamin (MULTIVITAMIN) tablet Take 1 tablet by mouth daily.      . sertraline (ZOLOFT) 50 MG tablet TAKE 2 TABLETS BY MOUTH EVERY DAY 180 tablet 2  . valACYclovir (VALTREX) 500 MG tablet TAKE 1 TABLET BY MOUTH DAILY**CAN INCREASE TO 2 TABS FOR 5 DAY IN THE EVENT OF RECURRENCE** 30 tablet 11   No current facility-administered medications for this visit.     On ROS today: dysphagia resolved, facial asx resolved   Physical Examination   Vitals:   08/16/16 1134 08/16/16 1137 08/16/16 1138  BP: (!) 149/72 (!) 152/64 136/64  Pulse: (!) 53    Resp: 18    Temp: 98 F (36.7 C)    TempSrc: Oral    SpO2: 98%    Weight: 165 lb 4.8 oz (75 kg)    Height: 5\' 5"  (1.651 m)     Body mass index is 27.51 kg/m.  General Alert, O x 3, WD, NAD  Neck Supple, mid-line trachea, incision well healed  Pulmonary Sym exp, good B air movt, CTA B  Cardiac RRR, Nl S1, S2, no Murmurs, No rubs, No S3,S4  Vascular Vessel Right Left  Radial Palpable Palpable  Brachial Palpable Palpable  Carotid Palpable, No Bruit Palpable, No Bruit  Aorta Not palpable N/A  Femoral Palpable Palpable  Popliteal Not palpable Not palpable  PT Palpable Palpable  DP Palpable Palpable    Gastro- intestinal soft, non-distended, non-tender to palpation, No guarding or rebound, no HSM, no masses, no CVAT B, No palpable prominent aortic pulse,    Musculo- skeletal M/S 5/5 throughout  , Extremities without ischemic changes    Neurologic Cranial nerves 2-12 intact , Pain and light touch intact in extremities , Motor exam as listed above    Non-Invasive Vascular Imaging   B Carotid Duplex (08/16/2016):   R ICA stenosis:  likely 1-39%, focal increase in PSV into 60-79% range, EDV consistent with lower range  R VA: patent and antegrade  L ICA stenosis:   40-59%i  L VA: patent and antegrade   Medical Decision Making   Heather [name] is a 71 y.o. female who presents with: s/p R CEA for asx ICA stenosis >80%, asx L ICA stenosis 50%   Fortunately the prior marginal mandibular and hypoglossa palsies have essentially resolved.  Based on the patient's vascular studies and examination, I have offered the patient: 6 month B carotid duplex to re-evaluate R ICA.  I suspect the focal stenosis to be operator dependent as the EDV and rest of the study don't support a restenosis.  I discussed in depth with the patient the nature of atherosclerosis, and emphasized the importance of maximal medical management including strict control of blood pressure, blood glucose, and lipid levels, antiplatelet agents, obtaining regular exercise, and cessation of smoking.    The patient is aware that without maximal medical management the underlying atherosclerotic disease process will progress, limiting the benefit of any interventions. The patient is currently on a statin: Lipitor.  The patient is currently on an anti-platelet: ASA.  Thank you for allowing us to participate in this patient's care.   Leonides SakeBrian Khaliel Morey, MD, FACS [facility] Office: [phone] Pager: [phone]

## 2016-08-12 NOTE — Telephone Encounter (Signed)
Tylenol 3

## 2016-08-13 NOTE — Telephone Encounter (Signed)
[facility]

## 2016-08-16 ENCOUNTER — Encounter: Payer: Self-pay | Admitting: Vascular Surgery

## 2016-08-16 ENCOUNTER — Ambulatory Visit (INDEPENDENT_AMBULATORY_CARE_PROVIDER_SITE_OTHER): Payer: Medicare Other | Admitting: Vascular Surgery

## 2016-08-16 ENCOUNTER — Ambulatory Visit (HOSPITAL_COMMUNITY)
Admission: RE | Admit: 2016-08-16 | Discharge: 2016-08-16 | Disposition: A | Discharge status: Home / Self Care | Payer: Medicare Other | Source: Ambulatory Visit | Attending: Vascular Surgery | Admitting: Vascular Surgery

## 2016-08-16 VITALS — BP 136/64 | HR 53 | Temp 98.0°F | Resp 18 | Ht 65.0 in | Wt 165.3 lb

## 2016-08-16 DIAGNOSIS — I779 Disorder of arteries and arterioles, unspecified: Secondary | ICD-10-CM

## 2016-08-16 DIAGNOSIS — I739 Peripheral vascular disease, unspecified: Principal | ICD-10-CM

## 2016-08-16 DIAGNOSIS — I6523 Occlusion and stenosis of bilateral carotid arteries: Secondary | ICD-10-CM | POA: Diagnosis not present

## 2016-08-16 LAB — VAS US CAROTID
LCCADDIAS: 15 cm/s
LCCAPDIAS: 15 cm/s
LCCAPSYS: 76 cm/s
LEFT ECA DIAS: -12 cm/s
LICADSYS: -124 cm/s
Left CCA dist sys: 83 cm/s
Left ICA dist dias: -34 cm/s
Left ICA prox dias: -42 cm/s
Left ICA prox sys: -164 cm/s
RCCADSYS: -138 cm/s
RCCAPSYS: 72 cm/s
RIGHT CCA MID DIAS: 24 cm/s
RIGHT ECA DIAS: -20 cm/s
Right CCA prox dias: 16 cm/s

## 2016-08-29 NOTE — Addendum Note (Signed)
Addended by: Burton ApleyPETTY, Elianie Hubers A on: 08/29/2016 01:41 PM   Modules accepted: Orders

## 2016-09-02 ENCOUNTER — Other Ambulatory Visit: Payer: Self-pay | Admitting: Neurology

## 2016-09-02 ENCOUNTER — Ambulatory Visit (INDEPENDENT_AMBULATORY_CARE_PROVIDER_SITE_OTHER): Payer: Medicare Other | Admitting: Adult Health

## 2016-09-02 ENCOUNTER — Encounter: Payer: Self-pay | Admitting: Adult Health

## 2016-09-02 VITALS — BP 129/60 | HR 58 | Wt 166.0 lb

## 2016-09-02 DIAGNOSIS — G519 Disorder of facial nerve, unspecified: Secondary | ICD-10-CM

## 2016-09-02 DIAGNOSIS — G523 Disorders of hypoglossal nerve: Secondary | ICD-10-CM

## 2016-09-02 MED ORDER — GABAPENTIN 100 MG PO CAPS: ORAL_CAPSULE | ORAL | 0 refills | Status: DC

## 2016-09-02 NOTE — Progress Notes (Signed)
PATIENT: Heather [name] DOB: [birth year]  REASON FOR VISIT: follow up HISTORY FROM: patient  HISTORY OF PRESENT ILLNESS: [name] 09/02/16 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 She returns today for follow-up. She reports that everything has remained stable. She states that her discomfort is controlled with gabapentin taking 200 mg in the morning, 100 mg at noon and 300 mg at bedtime. She does report some neck pain on the right side. She also reports muscle stiffness on the right side of the neck. She denies any numbness in the face. Denies any sharp shooting pains. Denies any trouble eating or swallowing. She states that she has followed up with her orthopedic for ongoing back pain however she is not a surgical candidate at this time. She returns today for an evaluation.  HISTORY 02/26/16: 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 The patient returns today for follow-up. She states that she has continued to notice some improvement. She states that her smile is no longer crooked She states that her tongue still feels swollen when she is talking. She states that if she tries talking too much she will get tongue tied. She does have ongoing neck pain on the right side after the endarterectomy. She is using gabapentin 200 mg 3 times a day. She states that she did decrease her lunch dose 200 mg and increase her bedtime dose of 300 mg. She reports that she is resting better since she made this change. She continues to have low back pain and pain in her knees. Her orthopedist gave her Tylenol with Codeine. She reports that she was scheduled for back surgery but this was put on hold after the right carotid  indirectly. She returns today for an evaluation.   REVIEW OF SYSTEMS: Out of a complete 14 system review of symptoms, the patient complains only of the following symptoms, and all other reviewed systems are negative.  Wheezing, drooling, joint pain, joint swelling, back pain, aching muscles, walking difficulty, neck pain, neck stiffness, depression, nervous/anxious, daytime sleepiness, headache, memory loss, swollen lymph nodes, environmental allergies  ALLERGIES: Allergies  Allergen Reactions  . Sulfamethoxazole Swelling    Unspecified area of swelling    HOME MEDICATIONS: Outpatient Medications Prior to Visit  Medication Sig Dispense Refill  . acetaminophen (TYLENOL) 500 MG tablet Take 2 tablets (1,000 mg total) by mouth every 6 (six) hours as needed for moderate pain. 50 tablet 6  . acetaminophen-codeine (TYLENOL #3) 300-30 MG tablet TAKE 1 TABLET BY MOUTH EVERY 8 HOURS AS NEEDED FOR MODERATE PAIN 40 tablet 0  . Ascorbic Acid (VITAMIN C) 1000 MG tablet Take 1,000 mg by mouth daily.    Marland Kitchen aspirin EC 81 MG tablet Take 1 tablet (81 mg total) by mouth daily.    Marland Kitchen atorvastatin (LIPITOR) 40 MG tablet TAKE 1 TABLET (40 MG TOTAL) BY MOUTH DAILY. 30 tablet 11  . busPIRone (BUSPAR) 7.5 MG tablet Take 1 tablet (7.5 mg total) by mouth 2 (two) times daily. MUST SCHEDULE ANNUAL PHYSICAL 180 tablet 0  . Cholecalciferol (VITAMIN D3) 1000 UNITS CAPS Take 1 capsule by mouth daily.     Marland Kitchen lidocaine (XYLOCAINE) 2 % jelly Apply 1 application topically as needed. Apply to the skin over the  knees and hips up to BID 30 mL 6  . loratadine (CLARITIN) 10 MG tablet Take 10 mg by mouth daily.      Marland Kitchen losartan (COZAAR) 100 MG tablet TAKE 1 TABLET (100 MG TOTAL) BY MOUTH DAILY. 30 tablet 8  . meloxicam (MOBIC) 15 MG tablet Take 1 tablet (15 mg total) by mouth daily. 90 tablet 3  . metoprolol tartrate (LOPRESSOR) 25 MG tablet TAKE 1 TABLET (25 MG TOTAL) BY MOUTH 2 (TWO) TIMES DAILY. 180 tablet 1  . Multiple Vitamin  (MULTIVITAMIN) tablet Take 1 tablet by mouth daily.      . sertraline (ZOLOFT) 50 MG tablet TAKE 2 TABLETS BY MOUTH EVERY DAY 180 tablet 2  . valACYclovir (VALTREX) 500 MG tablet TAKE 1 TABLET BY MOUTH DAILY**CAN INCREASE TO 2 TABS FOR 5 DAY IN THE EVENT OF RECURRENCE** 30 tablet 11   No facility-administered medications prior to visit.     PAST MEDICAL HISTORY: Past Medical History:  Diagnosis Date  . Anxiety   . Chicken pox   . Depression   . Headache 06/23/2015  . Heart murmur    MVP  . History of blood transfusion    1975 - leg fracture  . HOH (hard of hearing)    left  . Hypertension   . MVP (mitral valve prolapse)    echo 1986-mild  . 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 Shortness of breath dyspnea    WITH EXERTION    . Wears glasses     PAST SURGICAL HISTORY: Past Surgical History:  Procedure Laterality Date  . CARPAL TUNNEL RELEASE Right 03/31/2013   Procedure: RIGHT CARPAL TUNNEL RELEASE;  Surgeon: Nicki Reaper, MD;  Location: [facility];  Service: Orthopedics;  Laterality: Right;  . ENDARTERECTOMY Right 05/03/2015   Procedure: RIGHT CAROTID ENDARTERECTOMY ;  Surgeon: Fransisco Hertz, MD;  Location: [facility];  Service: Vascular;  Laterality: Right;  . NASAL SINUS SURGERY  1975  . PLANTAR FASCIA RELEASE  1968   both feet  . ROTATOR CUFF REPAIR Right 1998  . ROTATOR CUFF REPAIR Right   . TIBIA FRACTURE SURGERY Right 1975   rt  . TONSILLECTOMY  1951  . TUBAL LIGATION  1995    FAMILY HISTORY: Family History  Problem Relation Age of Onset  . Adopted: Yes  . Osteoporosis Mother   . Heart disease Mother   . Hypertension Mother   . Hyperlipidemia Mother   . Stroke Mother   . Arthritis Mother   . Diabetes Father   . Heart attack Father   . Cancer Sister        breast  . Colon cancer Neg Hx     SOCIAL HISTORY: Social History   Social History  . Marital  status: Divorced    Spouse name: N/A  . Number of children: 1  . Years of education: 48   Occupational History  . Retired    Social History Main Topics  . Smoking status: Never Smoker  . Smokeless tobacco: Never Used  . Alcohol use 1.8 oz/week    3 Cans of beer per week  . Drug use: No  . Sexual activity: Yes   Other Topics Concern  . Not on file   Social History Narrative   Lives at home w/ significant other   Right-handed   Drinks 2 cups caffeine per day      PHYSICAL  EXAM  Vitals:   09/02/16 0900  BP: 129/60  Pulse: (!) 58  Weight: 166 lb (75.3 kg)   Body mass index is 27.62 kg/m.  Generalized: Well developed, in no acute distress   Neurological examination  Mentation: Alert oriented to time, place, history taking. Follows all commands speech and language fluent Cranial nerve II-XII: Pupils were equal round reactive to light. Extraocular movements were full, visual field were full on confrontational test. Facial sensation and strength were normal. Uvula tongue midline. Head turning and shoulder shrug  were normal and symmetric. Motor: The motor testing reveals 5 over 5 strength of all 4 extremities. Good symmetric motor tone is noted throughout.  Sensory: Sensory testing is intact to soft touch on all 4 extremities. No evidence of extinction is noted.  Coordination: Cerebellar testing reveals good finger-nose-finger and heel-to-shin bilaterally.  Gait and station: Gait is normal. Tandem gait is normal. Romberg is negative. No drift is seen.  Reflexes: Deep tendon reflexes are symmetric and normal bilaterally.   DIAGNOSTIC DATA (LABS, IMAGING, TESTING) - I reviewed patient records, labs, notes, testing and imaging myself where available.  Lab Results  Component Value Date   WBC 8.4 05/04/2015   HGB 9.8 (L) 05/04/2015   HCT 28.8 (L) 05/04/2015   MCV 93.5 05/04/2015   PLT 195 05/04/2015      Component Value Date/Time   NA 140 07/27/2015 1016   K 4.8  07/27/2015 1016   CL 104 07/27/2015 1016   CO2 28 07/27/2015 1016   GLUCOSE 104 (H) 07/27/2015 1016   BUN 20 07/27/2015 1016   CREATININE 0.84 07/27/2015 1016   CALCIUM 9.6 07/27/2015 1016   PROT 5.9 (L) 06/05/2015 0825   ALBUMIN 4.0 06/05/2015 0825   AST 16 06/05/2015 0825   ALT 24 06/05/2015 0825   ALKPHOS 93 06/05/2015 0825   BILITOT 0.4 06/05/2015 0825   GFRNONAA >60 05/04/2015 0450   GFRAA >60 05/04/2015 0450   Lab Results  Component Value Date   CHOL 162 06/05/2015   HDL 63 06/05/2015   LDLCALC 77 06/05/2015   LDLDIRECT 132.0 02/21/2015   TRIG 111 06/05/2015   CHOLHDL 2.6 06/05/2015   Lab Results  Component Value Date   HGBA1C 5.8 02/21/2015        ASSESSMENT AND PLAN 71 y.o. year old female  has a past medical history of Anxiety; Chicken pox; Depression; Headache (06/23/2015); Heart murmur; History of blood transfusion; HOH (hard of hearing); Hypertension; MVP (mitral valve prolapse); Primary genital herpes simplex infection (08/21/2012); Shortness of breath dyspnea; and Wears glasses. here with:  1. Facial neuropathy 2. Hypoglossal neuropathy  Overall the patient has remained stable. She will continue on gabapentin taking 200 mg in the morning, 100 mg at noon and 300 mg at bedtime. I have advised that if her symptoms worsen or she develops new symptoms she should let us know. She will follow-up in 6 months with Dr. Anne HahnWillis.    Butch PennyMegan Jasimine Simms, MSN, NP-C 09/02/2016, 9:02 AM [facility] [address], [address] [place], [place] [address] [phone]

## 2016-09-02 NOTE — Patient Instructions (Signed)
Continue gabapentin 200 mg in the morning, 100 mg at noon and 300 mg at bedtime If your symptoms worsen or you develop new symptoms please let us know.

## 2016-09-02 NOTE — Progress Notes (Signed)
I have read the note, and I agree with the clinical assessment and plan.  Mikala Podoll KEITH   

## 2016-09-17 ENCOUNTER — Other Ambulatory Visit (INDEPENDENT_AMBULATORY_CARE_PROVIDER_SITE_OTHER): Payer: Self-pay | Admitting: Specialist

## 2016-09-17 ENCOUNTER — Other Ambulatory Visit: Payer: Self-pay | Admitting: Obstetrics & Gynecology

## 2016-09-17 DIAGNOSIS — M1611 Unilateral primary osteoarthritis, right hip: Secondary | ICD-10-CM

## 2016-09-17 DIAGNOSIS — M48062 Spinal stenosis, lumbar region with neurogenic claudication: Secondary | ICD-10-CM

## 2016-09-17 DIAGNOSIS — M5136 Other intervertebral disc degeneration, lumbar region: Secondary | ICD-10-CM

## 2016-09-17 MED ORDER — ACETAMINOPHEN-CODEINE #3 300-30 MG PO TABS: ORAL_TABLET | ORAL | 0 refills | Status: DC

## 2016-09-17 NOTE — Telephone Encounter (Signed)
[facility] [place]  Per pt request.  Pt is aware that her has been called in

## 2016-09-17 NOTE — Addendum Note (Signed)
Addended by: Penne Lash, Neysa Bonito N on: 09/17/2016 12:48 PM   Modules accepted: Orders

## 2016-09-17 NOTE — Telephone Encounter (Signed)
Patient called asking for a refill on tylenol with codeine. If you could give her a call when its called in. Thank you.   [phone]

## 2016-10-16 ENCOUNTER — Other Ambulatory Visit (INDEPENDENT_AMBULATORY_CARE_PROVIDER_SITE_OTHER): Payer: Self-pay | Admitting: Specialist

## 2016-10-16 DIAGNOSIS — M48062 Spinal stenosis, lumbar region with neurogenic claudication: Secondary | ICD-10-CM

## 2016-10-16 DIAGNOSIS — M5136 Other intervertebral disc degeneration, lumbar region: Secondary | ICD-10-CM

## 2016-10-16 DIAGNOSIS — M1611 Unilateral primary osteoarthritis, right hip: Secondary | ICD-10-CM

## 2016-10-16 NOTE — Addendum Note (Signed)
Addended by: Penne Lash, Neysa Bonito N on: 10/16/2016 10:51 AM   Modules accepted: Orders

## 2016-10-16 NOTE — Telephone Encounter (Signed)
Patient called asking for a refill on tylenol with codeine. CB # [phone]

## 2016-10-24 MED ORDER — ACETAMINOPHEN-CODEINE #3 300-30 MG PO TABS: ORAL_TABLET | ORAL | 0 refills | Status: DC

## 2016-10-24 NOTE — Telephone Encounter (Signed)
[facility] and patient is aware.

## 2016-11-04 ENCOUNTER — Other Ambulatory Visit: Payer: Self-pay | Admitting: Internal Medicine

## 2016-11-04 NOTE — Telephone Encounter (Signed)
Received refill electronically Last refill 08/05/16 #180- patient notified to schedule physical for further refills No upcoming appointment scheduled Last office visit 05/28/16

## 2016-11-05 NOTE — Telephone Encounter (Signed)
PT sch AWV 10/23 at 10:45am and is requesting refill. She will be out of Buspar meds by Thursday 10/11.

## 2016-11-12 ENCOUNTER — Ambulatory Visit: Payer: Medicare Other | Admitting: Internal Medicine

## 2016-11-14 ENCOUNTER — Telehealth: Payer: Self-pay | Admitting: Internal Medicine

## 2016-11-19 ENCOUNTER — Ambulatory Visit (INDEPENDENT_AMBULATORY_CARE_PROVIDER_SITE_OTHER): Payer: Medicare Other | Admitting: Internal Medicine

## 2016-11-19 ENCOUNTER — Encounter: Payer: Self-pay | Admitting: Internal Medicine

## 2016-11-19 VITALS — BP 130/82 | HR 71 | Temp 98.0°F | Ht 64.5 in | Wt 160.0 lb

## 2016-11-19 DIAGNOSIS — Z0000 Encounter for general adult medical examination without abnormal findings: Secondary | ICD-10-CM | POA: Diagnosis not present

## 2016-11-19 DIAGNOSIS — E7800 Pure hypercholesterolemia, unspecified: Secondary | ICD-10-CM | POA: Diagnosis not present

## 2016-11-19 DIAGNOSIS — J302 Other seasonal allergic rhinitis: Secondary | ICD-10-CM

## 2016-11-19 DIAGNOSIS — F419 Anxiety disorder, unspecified: Secondary | ICD-10-CM

## 2016-11-19 DIAGNOSIS — I10 Essential (primary) hypertension: Secondary | ICD-10-CM | POA: Diagnosis not present

## 2016-11-19 DIAGNOSIS — A6004 Herpesviral vulvovaginitis: Secondary | ICD-10-CM

## 2016-11-19 DIAGNOSIS — F32A Depression, unspecified: Secondary | ICD-10-CM

## 2016-11-19 DIAGNOSIS — F329 Major depressive disorder, single episode, unspecified: Secondary | ICD-10-CM

## 2016-11-19 DIAGNOSIS — I499 Cardiac arrhythmia, unspecified: Secondary | ICD-10-CM

## 2016-11-19 DIAGNOSIS — M549 Dorsalgia, unspecified: Secondary | ICD-10-CM

## 2016-11-19 DIAGNOSIS — G8929 Other chronic pain: Secondary | ICD-10-CM

## 2016-11-19 DIAGNOSIS — J069 Acute upper respiratory infection, unspecified: Secondary | ICD-10-CM

## 2016-11-19 DIAGNOSIS — B9789 Other viral agents as the cause of diseases classified elsewhere: Secondary | ICD-10-CM | POA: Diagnosis not present

## 2016-11-19 LAB — CBC
HEMATOCRIT: 40.6 % (ref 36.0–46.0)
HEMOGLOBIN: 13.5 g/dL (ref 12.0–15.0)
MCHC: 33.3 g/dL (ref 30.0–36.0)
MCV: 97 fl (ref 78.0–100.0)
PLATELETS: 272 10*3/uL (ref 150.0–400.0)
RBC: 4.19 Mil/uL (ref 3.87–5.11)
RDW: 12.9 % (ref 11.5–15.5)
WBC: 9.2 10*3/uL (ref 4.0–10.5)

## 2016-11-19 LAB — COMPREHENSIVE METABOLIC PANEL
ALT: 23 U/L (ref 0–35)
AST: 19 U/L (ref 0–37)
Albumin: 4.4 g/dL (ref 3.5–5.2)
Alkaline Phosphatase: 84 U/L (ref 39–117)
BILIRUBIN TOTAL: 0.4 mg/dL (ref 0.2–1.2)
BUN: 27 mg/dL — ABNORMAL HIGH (ref 6–23)
CALCIUM: 10.3 mg/dL (ref 8.4–10.5)
CHLORIDE: 105 meq/L (ref 96–112)
CO2: 29 meq/L (ref 19–32)
Creatinine, Ser: 0.89 mg/dL (ref 0.40–1.20)
GFR: 66.29 mL/min (ref >60.00)
Glucose, Bld: 111 mg/dL — ABNORMAL HIGH (ref 70–99)
POTASSIUM: 5.3 meq/L — AB (ref 3.5–5.1)
Sodium: 140 mEq/L (ref 135–145)
Total Protein: 6.9 g/dL (ref 6.0–8.3)

## 2016-11-19 LAB — VITAMIN D 25 HYDROXY (VIT D DEFICIENCY, FRACTURES): VITD: 44.14 ng/mL (ref 30.00–100.00)

## 2016-11-19 LAB — LIPID PANEL
CHOL/HDL RATIO: 3
Cholesterol: 172 mg/dL (ref 0–200)
HDL: 59.3 mg/dL (ref >39.00)
LDL CALC: 88 mg/dL (ref 0–99)
NonHDL: 112.55
TRIGLYCERIDES: 121 mg/dL (ref 0.0–149.0)
VLDL: 24.2 mg/dL (ref 0.0–40.0)

## 2016-11-19 MED ORDER — ALBUTEROL SULFATE HFA 108 (90 BASE) MCG/ACT IN AERS: 2.0000 | INHALATION_SPRAY | Freq: Four times a day (QID) | RESPIRATORY_TRACT | 0 refills | Status: DC | PRN

## 2016-11-19 MED ORDER — PROMETHAZINE-DM 6.25-15 MG/5ML PO SYRP: 5.0000 mL | ORAL_SOLUTION | Freq: Four times a day (QID) | ORAL | 0 refills | Status: DC | PRN

## 2016-11-19 NOTE — Progress Notes (Signed)
HPI:  Pt presents to the clinic today for her Medicare Wellness Exam. She is also due to follow up from chronic conditions.  Seasonal Allergies: Worse in the spring and gall. She takes Claritin with good relief.  Anxiety and Depression: More anxiety than depression. She feels like her symptoms are well controlled on Zoloft and Buspar. She denies SI/HI.  HTN: Her BP today is 130/82. She is taking Losartan and Lopressor daily as prescribed. ECG from 12/2015 reviewed.  HLD with CAD: Her last LDL was 77. She denies myalgias on Atorvastatin. She does eat some fried foods.   Chronic Back Pain: She reports she is in pain every day. She follows with Dr. Otelia Sergeant. She is taking Meloxicam and  And Tylenol as prescribed.  Genital Herpes: She denies recent outbreaks. She is on suppressive therapy with Valtrex.  She also c/o a cough, chest congestion and wheezing. She reports this started 2-3 weeks ago. The cough is non productive. She denies runny nose, nasal congestion or sore throat. She denies fever, chills or body aches but has felt fatigued. She has not taken anything OTC for her symptoms. She has not had sick contacts that she is aware of .  Past Medical History:  Diagnosis Date  . Anxiety   . Chicken pox   . Depression   . Headache 06/23/2015  . Heart murmur    MVP  . History of blood transfusion    1975 - leg fracture  . HOH (hard of hearing)    left  . Hypertension   . MVP (mitral valve prolapse)    echo 1986-mild  . 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 Shortness of breath dyspnea    WITH EXERTION    . Wears glasses     Current Outpatient Prescriptions  Medication Sig Dispense Refill  . acetaminophen (TYLENOL) 500 MG tablet Take 2 tablets (1,000 mg total) by mouth every 6 (six) hours as needed for moderate pain. 50 tablet 6  . acetaminophen-codeine (TYLENOL #3) 300-30 MG tablet TAKE 1 TABLET  BY MOUTH EVERY 8 HOURS AS NEEDED FOR MODERATE PAIN 40 tablet 0  . Ascorbic Acid (VITAMIN C) 1000 MG tablet Take 1,000 mg by mouth daily.    Marland Kitchen aspirin EC 81 MG tablet Take 1 tablet (81 mg total) by mouth daily.    Marland Kitchen atorvastatin (LIPITOR) 40 MG tablet TAKE 1 TABLET (40 MG TOTAL) BY MOUTH DAILY. 30 tablet 11  . busPIRone (BUSPAR) 7.5 MG tablet TAKE 1 TABLET (7.5 MG TOTAL) BY MOUTH 2 (TWO) TIMES DAILY. MUST SCHEDULE ANNUAL PHYSICAL 180 tablet 0  . Cholecalciferol (VITAMIN D3) 1000 UNITS CAPS Take 1 capsule by mouth daily.     Marland Kitchen gabapentin (NEURONTIN) 100 MG capsule Take 200 mg PO in the AM, 100 mg at noon and 300 mg at bedtime 540 capsule 0  . gabapentin (NEURONTIN) 100 MG capsule TAKE 2 CAPSULES (200 MG TOTAL) BY MOUTH 3 (THREE) TIMES DAILY. 540 capsule 0  . lidocaine (XYLOCAINE) 2 % jelly Apply 1 application topically as needed. Apply to the skin over the knees and hips up to BID 30 mL 6  . loratadine (CLARITIN) 10 MG tablet Take 10 mg by mouth daily.      Marland Kitchen losartan (COZAAR) 100 MG tablet TAKE 1 TABLET (100 MG TOTAL) BY MOUTH DAILY. 30 tablet 8  . meloxicam (MOBIC) 15 MG tablet Take 1 tablet (15 mg total) by mouth  daily. 90 tablet 3  . metoprolol tartrate (LOPRESSOR) 25 MG tablet TAKE 1 TABLET (25 MG TOTAL) BY MOUTH 2 (TWO) TIMES DAILY. 180 tablet 1  . Multiple Vitamin (MULTIVITAMIN) tablet Take 1 tablet by mouth daily.      . sertraline (ZOLOFT) 50 MG tablet TAKE 2 TABLETS BY MOUTH EVERY DAY 180 tablet 0  . valACYclovir (VALTREX) 500 MG tablet TAKE 1 TABLET BY MOUTH DAILY**CAN INCREASE TO 2 TABS FOR 5 DAY IN THE EVENT OF RECURRENCE** 30 tablet 10   No current facility-administered medications for this visit.     Allergies  Allergen Reactions  . Sulfamethoxazole Swelling    Unspecified area of swelling    Family History  Problem Relation Age of Onset  . Adopted: Yes  . Osteoporosis Mother   . Heart disease Mother   . Hypertension Mother   . Hyperlipidemia Mother   . Stroke Mother    . Arthritis Mother   . Diabetes Father   . Heart attack Father   . Cancer Sister        breast  . Colon cancer Neg Hx     Social History   Social History  . Marital status: Divorced    Spouse name: N/A  . Number of children: 1  . Years of education: 17   Occupational History  . Retired    Social History Main Topics  . Smoking status: Never Smoker  . Smokeless tobacco: Never Used  . Alcohol use 1.8 oz/week    3 Cans of beer per week  . Drug use: No  . Sexual activity: Yes   Other Topics Concern  . Not on file   Social History Narrative   Lives at home w/ significant other   Right-handed   Drinks 2 cups caffeine per day    Hospitiliaztions: None  Health Maintenance:    Flu: never  Tetanus: 03/2010  Pneumovax: 04/2015  Prevnar: 07/2015  Zostavax: never  Mammogram: 12/2011  Pap Smear: 12/2011  Bone Density: never  Colon Screening: never  Eye Doctor: as needed  Dental Exam: as needed   Providers:   PCP: Nicki Reaper, NP-C  Vascular: Dr. Imogene Burn  Orthopedics: Dr. Otelia Sergeant  Cardiology: Dr. Melburn Popper  Neurologist: Dr. Abram Sander   I have personally reviewed and have noted:  1. The patient's medical and social history 2. Their use of alcohol, tobacco or illicit drugs 3. Their current medications and supplements 4. The patient's functional ability including ADL's, fall risks, home safety risks and hearing or visual impairment. 5. Diet and physical activities 6. Evidence for depression or mood disorder  Subjective:   Review of Systems:   Constitutional: Pt reports fatigue. Denies fever, malaise, headache or abrupt weight changes.  HEENT: Denies eye pain, eye redness, ear pain, ringing in the ears, wax buildup, runny nose, nasal congestion, bloody nose, or sore throat. Respiratory: Pt reports cough. Denies difficulty breathing, shortness of breath, or sputum production.   Cardiovascular: Denies chest pain, chest tightness, palpitations or swelling in the hands or  feet.  Gastrointestinal: Denies abdominal pain, bloating, constipation, diarrhea or blood in the stool.  GU: Denies urgency, frequency, pain with urination, burning sensation, blood in urine, odor or discharge. Musculoskeletal: Pt reports chronic back pain. Denies decrease in range of motion, difficulty with gait, muscle pain or joint swelling.  Skin: Denies redness, rashes, lesions or ulcercations.  Neurological: Denies dizziness, difficulty with memory, difficulty with speech or problems with balance and coordination.  Psych: Pt has a history  of anxiety and depression. Denies SI/HI.  No other specific complaints in a complete review of systems (except as listed in HPI above).  Objective:  PE:  BP 130/82   Pulse 71   Temp 98 F (36.7 C) (Oral)   Ht 5' 4.5" (1.638 m)   Wt 160 lb (72.6 kg)   LMP 01/08/2012   SpO2 97%   BMI 27.04 kg/m   Wt Readings from Last 3 Encounters:  09/02/16 166 lb (75.3 kg)  08/16/16 165 lb 4.8 oz (75 kg)  07/03/16 166 lb (75.3 kg)    General: Appears her stated age, well developed, well nourished in NAD. Skin: Warm, dry and intact. Scar noted over right carotid. HEENT: Head: normal shape and size; Eyes: sclera white, no icterus, conjunctiva pink, PERRLA and EOMs intact; Ears: Tm's gray and intact, normal light reflex; Throat/Mouth: Teeth present, mucosa pink and moist, no exudate, lesions or ulcerations noted.  Neck: Neck supple, trachea midline. No masses, lumps or thyromegaly present.  Cardiovascular: Normal rate and rhythm. S1,S2 noted. Occasional irregular beat noted.  Slight murmur noted.  No JVD or BLE edema.  Pulmonary/Chest: Normal effort and positive vesicular breath sounds. Intermittent expiratory wheeze noted. No respiratory distress. No wheezes, rales or ronchi noted.  Abdomen: Soft and nontender. Normal bowel sounds. No distention or masses noted. Liver, spleen and kidneys non palpable. Musculoskeletal:  Strength 5/5 BUE/BLE. No difficulty  with gait. Neurological: Alert and oriented. Cranial nerves II-XII grossly intact. Coordination normal.  Psychiatric: Mood and affect normal. Behavior is normal. Judgment and thought content normal.     BMET    Component Value Date/Time   NA 140 07/27/2015 1016   K 4.8 07/27/2015 1016   CL 104 07/27/2015 1016   CO2 28 07/27/2015 1016   GLUCOSE 104 (H) 07/27/2015 1016   BUN 20 07/27/2015 1016   CREATININE 0.84 07/27/2015 1016   CALCIUM 9.6 07/27/2015 1016   GFRNONAA >60 05/04/2015 0450   GFRAA >60 05/04/2015 0450    Lipid Panel     Component Value Date/Time   CHOL 162 06/05/2015 0825   TRIG 111 06/05/2015 0825   HDL 63 06/05/2015 0825   CHOLHDL 2.6 06/05/2015 0825   VLDL 22 06/05/2015 0825   LDLCALC 77 06/05/2015 0825    CBC    Component Value Date/Time   WBC 8.4 05/04/2015 0450   RBC 3.08 (L) 05/04/2015 0450   HGB 9.8 (L) 05/04/2015 0450   HCT 28.8 (L) 05/04/2015 0450   PLT 195 05/04/2015 0450   MCV 93.5 05/04/2015 0450   MCH 31.8 05/04/2015 0450   MCHC 34.0 05/04/2015 0450   RDW 13.1 05/04/2015 0450   LYMPHSABS 2.0 08/16/2014 1430   MONOABS 0.7 08/16/2014 1430   EOSABS 0.5 08/16/2014 1430   BASOSABS 0.0 08/16/2014 1430    Hgb A1C Lab Results  Component Value Date   HGBA1C 5.8 02/21/2015      Assessment and Plan:   Medicare Annual Wellness Visit:  Diet: She does eat meat. She consumes fruits and veggies daily. She does eat fried foods. She drinks mostly water, some Powerade. Physical activity: Sedentary Depression/mood screen: Positive, chronic on meds. Hearing: Intact to whispered voice Visual acuity: Grossly normal ADLs: Capable Fall risk: None Home safety: Good Cognitive evaluation: Intact to orientation, naming, recall and repetition EOL planning: No adv directives full code/ I agree  Preventative Medicine: She declines flu shot or shingles vaccine. Pneumovax, prevnar, and tetanus UTD. She declines pap smear, mammogram, bone density or  colon screening. Encouraged her to consume a balanced diet and exercise regimen. Advised her to see an eye doctor and dentist annually. Will check CBC, CMET, Lipid and Vit D today.  Viral URI with Cough:  eRx for Albuterol inhaler eRx for Dextrom-Prometh cough syrup  Next appointment: 1 year, Medicare Wellness Exam   Nicki Reaper, NP

## 2016-11-19 NOTE — Patient Instructions (Signed)
Health Maintenance for Postmenopausal Women Menopause is a normal process in which your reproductive ability comes to an end. This process happens gradually over a span of months to years, usually between the ages of 22 and 9. Menopause is complete when you have missed 12 consecutive menstrual periods. It is important to talk with your health care provider about some of the most common conditions that affect postmenopausal women, such as heart disease, cancer, and bone loss (osteoporosis). Adopting a healthy lifestyle and getting preventive care can help to promote your health and wellness. Those actions can also lower your chances of developing some of these common conditions. What should I know about menopause? During menopause, you may experience a number of symptoms, such as:  Moderate-to-severe hot flashes.  Night sweats.  Decrease in sex drive.  Mood swings.  Headaches.  Tiredness.  Irritability.  Memory problems.  Insomnia.  Choosing to treat or not to treat menopausal changes is an individual decision that you make with your health care provider. What should I know about hormone replacement therapy and supplements? Hormone therapy products are effective for treating symptoms that are associated with menopause, such as hot flashes and night sweats. Hormone replacement carries certain risks, especially as you become older. If you are thinking about using estrogen or estrogen with progestin treatments, discuss the benefits and risks with your health care provider. What should I know about heart disease and stroke? Heart disease, heart attack, and stroke become more likely as you age. This may be due, in part, to the hormonal changes that your body experiences during menopause. These can affect how your body processes dietary fats, triglycerides, and cholesterol. Heart attack and stroke are both medical emergencies. There are many things that you can do to help prevent heart disease  and stroke:  Have your blood pressure checked at least every 1-2 years. High blood pressure causes heart disease and increases the risk of stroke.  If you are 53-22 years old, ask your health care provider if you should take aspirin to prevent a heart attack or a stroke.  Do not use any tobacco products, including cigarettes, chewing tobacco, or electronic cigarettes. If you need help quitting, ask your health care provider.  It is important to eat a healthy diet and maintain a healthy weight. ? Be sure to include plenty of vegetables, fruits, low-fat dairy products, and lean protein. ? Avoid eating foods that are high in solid fats, added sugars, or salt (sodium).  Get regular exercise. This is one of the most important things that you can do for your health. ? Try to exercise for at least 150 minutes each week. The type of exercise that you do should increase your heart rate and make you sweat. This is known as moderate-intensity exercise. ? Try to do strengthening exercises at least twice each week. Do these in addition to the moderate-intensity exercise.  Know your numbers.Ask your health care provider to check your cholesterol and your blood glucose. Continue to have your blood tested as directed by your health care provider.  What should I know about cancer screening? There are several types of cancer. Take the following steps to reduce your risk and to catch any cancer development as early as possible. Breast Cancer  Practice breast self-awareness. ? This means understanding how your breasts normally appear and feel. ? It also means doing regular breast self-exams. Let your health care provider know about any changes, no matter how small.  If you are 40  or older, have a clinician do a breast exam (clinical breast exam or CBE) every year. Depending on your age, family history, and medical history, it may be recommended that you also have a yearly breast X-ray (mammogram).  If you  have a family history of breast cancer, talk with your health care provider about genetic screening.  If you are at high risk for breast cancer, talk with your health care provider about having an MRI and a mammogram every year.  Breast cancer (BRCA) gene test is recommended for women who have family members with BRCA-related cancers. Results of the assessment will determine the need for genetic counseling and BRCA1 and for BRCA2 testing. BRCA-related cancers include these types: ? Breast. This occurs in males or females. ? Ovarian. ? Tubal. This may also be called fallopian tube cancer. ? Cancer of the abdominal or pelvic lining (peritoneal cancer). ? Prostate. ? Pancreatic.  Cervical, Uterine, and Ovarian Cancer Your health care provider may recommend that you be screened regularly for cancer of the pelvic organs. These include your ovaries, uterus, and vagina. This screening involves a pelvic exam, which includes checking for microscopic changes to the surface of your cervix (Pap test).  For women ages 21-65, health care providers may recommend a pelvic exam and a Pap test every three years. For women ages 79-65, they may recommend the Pap test and pelvic exam, combined with testing for human papilloma virus (HPV), every five years. Some types of HPV increase your risk of cervical cancer. Testing for HPV may also be done on women of any age who have unclear Pap test results.  Other health care providers may not recommend any screening for nonpregnant women who are considered low risk for pelvic cancer and have no symptoms. Ask your health care provider if a screening pelvic exam is right for you.  If you have had past treatment for cervical cancer or a condition that could lead to cancer, you need Pap tests and screening for cancer for at least 20 years after your treatment. If Pap tests have been discontinued for you, your risk factors (such as having a new sexual partner) need to be  reassessed to determine if you should start having screenings again. Some women have medical problems that increase the chance of getting cervical cancer. In these cases, your health care provider may recommend that you have screening and Pap tests more often.  If you have a family history of uterine cancer or ovarian cancer, talk with your health care provider about genetic screening.  If you have vaginal bleeding after reaching menopause, tell your health care provider.  There are currently no reliable tests available to screen for ovarian cancer.  Lung Cancer Lung cancer screening is recommended for adults 69-62 years old who are at high risk for lung cancer because of a history of smoking. A yearly low-dose CT scan of the lungs is recommended if you:  Currently smoke.  Have a history of at least 30 pack-years of smoking and you currently smoke or have quit within the past 15 years. A pack-year is smoking an average of one pack of cigarettes per day for one year.  Yearly screening should:  Continue until it has been 15 years since you quit.  Stop if you develop a health problem that would prevent you from having lung cancer treatment.  Colorectal Cancer  This type of cancer can be detected and can often be prevented.  Routine colorectal cancer screening usually begins at  age 42 and continues through age 45.  If you have risk factors for colon cancer, your health care provider may recommend that you be screened at an earlier age.  If you have a family history of colorectal cancer, talk with your health care provider about genetic screening.  Your health care provider may also recommend using home test kits to check for hidden blood in your stool.  A small camera at the end of a tube can be used to examine your colon directly (sigmoidoscopy or colonoscopy). This is done to check for the earliest forms of colorectal cancer.  Direct examination of the colon should be repeated every  5-10 years until age 71. However, if early forms of precancerous polyps or small growths are found or if you have a family history or genetic risk for colorectal cancer, you may need to be screened more often.  Skin Cancer  Check your skin from head to toe regularly.  Monitor any moles. Be sure to tell your health care provider: ? About any new moles or changes in moles, especially if there is a change in a mole's shape or color. ? If you have a mole that is larger than the size of a pencil eraser.  If any of your family members has a history of skin cancer, especially at a young age, talk with your health care provider about genetic screening.  Always use sunscreen. Apply sunscreen liberally and repeatedly throughout the day.  Whenever you are outside, protect yourself by wearing long sleeves, pants, a wide-brimmed hat, and sunglasses.  What should I know about osteoporosis? Osteoporosis is a condition in which bone destruction happens more quickly than new bone creation. After menopause, you may be at an increased risk for osteoporosis. To help prevent osteoporosis or the bone fractures that can happen because of osteoporosis, the following is recommended:  If you are 46-71 years old, get at least 1,000 mg of calcium and at least 600 mg of vitamin D per day.  If you are older than age 55 but younger than age 65, get at least 1,200 mg of calcium and at least 600 mg of vitamin D per day.  If you are older than age 54, get at least 1,200 mg of calcium and at least 800 mg of vitamin D per day.  Smoking and excessive alcohol intake increase the risk of osteoporosis. Eat foods that are rich in calcium and vitamin D, and do weight-bearing exercises several times each week as directed by your health care provider. What should I know about how menopause affects my mental health? Depression may occur at any age, but it is more common as you become older. Common symptoms of depression  include:  Low or sad mood.  Changes in sleep patterns.  Changes in appetite or eating patterns.  Feeling an overall lack of motivation or enjoyment of activities that you previously enjoyed.  Frequent crying spells.  Talk with your health care provider if you think that you are experiencing depression. What should I know about immunizations? It is important that you get and maintain your immunizations. These include:  Tetanus, diphtheria, and pertussis (Tdap) booster vaccine.  Influenza every year before the flu season begins.  Pneumonia vaccine.  Shingles vaccine.  Your health care provider may also recommend other immunizations. This information is not intended to replace advice given to you by your health care provider. Make sure you discuss any questions you have with your health care provider. Document Released: 03/08/2005  Document Revised: 08/04/2015 Document Reviewed: 10/18/2014 Elsevier Interactive Patient Education  2018 [employer]  

## 2016-11-20 NOTE — Assessment & Plan Note (Signed)
Chronic but stable on Zoloft and Buspar Support offered today Will monitor

## 2016-11-20 NOTE — Assessment & Plan Note (Signed)
CMET and lipid profile today Encouraged her to consume a low fat diet Continue Lipitor for now, will adjust if needed based on labs

## 2016-11-20 NOTE — Assessment & Plan Note (Signed)
Controlled on suppressive Valtrex Will monitor

## 2016-11-20 NOTE — Assessment & Plan Note (Signed)
BP controlled on Losartan and Metoprolol ECG today: intermittent PAC's CMET today Will monitor

## 2016-11-20 NOTE — Assessment & Plan Note (Signed)
Encouraged her to stay active, exercise for weight loss Continue Meloxicam and Tylenol for now She will continue to follow with Dr. Otelia SergeantNitka.

## 2016-11-20 NOTE — Assessment & Plan Note (Signed)
Continue Claritin prn 

## 2016-11-21 ENCOUNTER — Other Ambulatory Visit (INDEPENDENT_AMBULATORY_CARE_PROVIDER_SITE_OTHER): Payer: Self-pay | Admitting: Specialist

## 2016-11-21 ENCOUNTER — Telehealth: Payer: Self-pay | Admitting: Internal Medicine

## 2016-11-21 DIAGNOSIS — M1611 Unilateral primary osteoarthritis, right hip: Secondary | ICD-10-CM

## 2016-11-21 DIAGNOSIS — M5136 Other intervertebral disc degeneration, lumbar region: Secondary | ICD-10-CM

## 2016-11-21 DIAGNOSIS — M48062 Spinal stenosis, lumbar region with neurogenic claudication: Secondary | ICD-10-CM

## 2016-11-21 DIAGNOSIS — M51369 Other intervertebral disc degeneration, lumbar region without mention of lumbar back pain or lower extremity pain: Secondary | ICD-10-CM

## 2016-11-21 MED ORDER — ACETAMINOPHEN 500 MG PO TABS: 1000.0000 mg | ORAL_TABLET | Freq: Four times a day (QID) | ORAL | 6 refills | Status: DC | PRN

## 2016-11-21 NOTE — Telephone Encounter (Signed)
Copied from CRM [id number]. Topic: Quick Communication - See Telephone Encounter >> Nov 21, 2016  1:59 PM Terisa Starraylor, Brittany L wrote: CRM for notification. See Telephone encounter for:  11/21/16. Patient wants to know if its okay to take her cough medicine she was prescribed that has a little bit of sulfamethoxazole. She has an allergy to this. She just is wanting to know if she can take it and be ok or does Nicki ReaperRegina Baity need to call something else in.

## 2016-11-21 NOTE — Telephone Encounter (Signed)
I don't see any medication with sulfamethoxazole in it on her list. There could be a cross reaction with the meloxicam but usually people do okay What medication is she concerned with?

## 2016-11-21 NOTE — Telephone Encounter (Signed)
Copied from CRM [phone]. Topic: Quick Communication - See Telephone Encounter >> Nov 21, 2016  2:03 PM Heather [name], Heather [name] wrote: CRM for notification. See Telephone encounter for:  11/21/16. Patient is wanting to know if its ok to take the medicine regina baity prescribed her that has a little bit of SULFAMETHOXAZLE. She said she doesn't know if she needs something else called in or not. Please call and give her advice

## 2016-11-21 NOTE — Telephone Encounter (Signed)
Heather [name], Heather [name] [birth year]    Acetaminophen-codeine (Tylenol #3)300-30mg  tablet

## 2016-11-21 NOTE — Telephone Encounter (Signed)
Pt is concerned about potential of allergic reaction if she takes Med prescribed to her

## 2016-11-21 NOTE — Telephone Encounter (Signed)
This patient does have an allergy to sulfamethoxazole.

## 2016-11-21 NOTE — Telephone Encounter (Signed)
Tylenol refill request

## 2016-11-21 NOTE — Telephone Encounter (Signed)
Spoke to pt. Advised her that she should be ok taking the cough syrup. Dr Alphonsus SiasLetvak said there would not be enough in the syrup, if there was some in it, to cause any issues.

## 2016-11-21 NOTE — Telephone Encounter (Signed)
Pt needs advice on medication.

## 2016-11-22 NOTE — Telephone Encounter (Signed)
Called rx to [name]

## 2016-12-02 ENCOUNTER — Other Ambulatory Visit: Payer: Self-pay | Admitting: Adult Health

## 2016-12-05 ENCOUNTER — Other Ambulatory Visit: Payer: Self-pay | Admitting: Cardiovascular Disease

## 2016-12-10 ENCOUNTER — Ambulatory Visit (INDEPENDENT_AMBULATORY_CARE_PROVIDER_SITE_OTHER): Payer: Medicare Other | Admitting: Internal Medicine

## 2016-12-10 ENCOUNTER — Encounter: Payer: Self-pay | Admitting: Internal Medicine

## 2016-12-10 VITALS — BP 134/66 | HR 61 | Temp 98.2°F | Wt 164.5 lb

## 2016-12-10 DIAGNOSIS — E875 Hyperkalemia: Secondary | ICD-10-CM | POA: Diagnosis not present

## 2016-12-10 DIAGNOSIS — R062 Wheezing: Secondary | ICD-10-CM

## 2016-12-10 LAB — POTASSIUM: POTASSIUM: 4.8 meq/L (ref 3.5–5.1)

## 2016-12-10 MED ORDER — PREDNISONE 10 MG PO TABS: ORAL_TABLET | ORAL | 0 refills | Status: DC

## 2016-12-10 NOTE — Patient Instructions (Signed)
Bronchospasm, Adult Bronchospasm is a tightening of the airways going into the lungs. During an episode, it may be harder to breathe. You may cough, and you may make a whistling sound when you breathe (wheeze). This condition often affects people with asthma. What are the causes? This condition is caused by swelling and irritation in the airways. It can be triggered by:  An infection (common).  Seasonal allergies.  An allergic reaction.  Exercise.  Irritants. These include pollution, cigarette smoke, strong odors, aerosol sprays, and paint fumes.  Weather changes. Winds increase molds and pollens in the air. Cold air may cause swelling.  Stress and emotional upset.  What are the signs or symptoms? Symptoms of this condition include:  Wheezing. If the episode was triggered by an allergy, wheezing may start right away or hours later.  Nighttime coughing.  Frequent or severe coughing with a simple cold.  Chest tightness.  Shortness of breath.  Decreased ability to exercise.  How is this diagnosed? This condition is usually diagnosed with a review of your medical history and a physical exam. Tests, such as lung function tests, are sometimes done to look for other conditions. The need for a chest X-ray depends on where the wheezing occurs and whether it is the first time you have wheezed. How is this treated? This condition may be treated with:  Inhaled medicines. These open up the airways and help you breathe. They can be taken with an inhaler or a nebulizer device.  Corticosteroid medicines. These may be given for severe bronchospasm, usually when it is associated with asthma.  Avoiding triggers, such as irritants, infection, or allergies.  Follow these instructions at home: Medicines  Take over-the-counter and prescription medicines only as told by your health care provider.  If you need to use an inhaler or nebulizer to take your medicine, ask your health care  provider to explain how to use it correctly. If you were given a spacer, always use it with your inhaler. Lifestyle  Reduce the number of triggers in your home. To do this: ? Change your heating and air conditioning filter at least once a month. ? Limit your use of fireplaces and wood stoves. ? Do not smoke. Do not allow smoking in your home. ? Avoid using perfumes and fragrances. ? Get rid of pests, such as roaches and mice, and their droppings. ? Remove any mold from your home. ? Keep your house clean and dust free. Use unscented cleaning products. ? Replace carpet with wood, tile, or vinyl flooring. Carpet can trap dander and dust. ? Use allergy-proof pillows, mattress covers, and box spring covers. ? Wash bed sheets and blankets every week in hot water. Dry them in a dryer. ? Use blankets that are made of polyester or cotton. ? Wash your hands often. ? Do not allow pets in your bedroom.  Avoid breathing in cold air when you exercise. General instructions  Have a plan for seeking medical care. Know when to call your health care provider and local emergency services, and where to get emergency care.  Stay up to date on your immunizations.  When you have an episode of bronchospasm, stay calm. Try to relax and breathe more slowly.  If you have asthma, make sure you have an asthma action plan.  Keep all follow-up visits as told by your health care provider. This is important. Contact a health care provider if:  You have muscle aches.  You have chest pain.  The mucus that you   cough up (sputum) changes from clear or white to yellow, green, gray, or bloody.  You have a fever.  Your sputum gets thicker. Get help right away if:  Your wheezing and coughing get worse, even after you take your prescribed medicines.  It gets even harder to breathe.  You develop severe chest pain. Summary  Bronchospasm is a tightening of the airways going into the lungs.  During an episode of  bronchospasm, you may have a harder time breathing. You may cough and make a whistling sound when you breathe (wheeze).  Avoid exposure to triggers such as smoke, dust, mold, animal dander, and fragrances.  When you have an episode of bronchospasm, stay calm. Try to relax and breathe more slowly. This information is not intended to replace advice given to you by your health care provider. Make sure you discuss any questions you have with your health care provider. Document Released: 01/17/2003 Document Revised: 01/11/2016 Document Reviewed: 01/11/2016 Elsevier Interactive Patient Education  2017 Elsevier Inc.  

## 2016-12-10 NOTE — Progress Notes (Signed)
Subjective:    Patient ID: Heather [name], female    DOB: Aug 03, 1945, 71 y.o.   MRN: [id number]  HPI  Pt presents to the clinic today with c/o a runny nose, nasal congestion and cough. She reports this started 3 weeks ago. She is not blowing any mucous out of her nose. The cough is mostly nonproductive. She feels slightly short of breath. She denies fever, chills or body aches. She has been using Albuterol and Promethazine DM with minimal relief. She has not had sick contacts that she is aware of.   She is also due to have her potassium repeated. It was 5.3 on 10/23.  Review of Systems      Past Medical History:  Diagnosis Date  . Anxiety   . Chicken pox   . Depression   . Headache 06/23/2015  . Heart murmur    MVP  . History of blood transfusion    1975 - leg fracture  . HOH (hard of hearing)    left  . Hypertension   . MVP (mitral valve prolapse)    echo 1986-mild  . 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 Shortness of breath dyspnea    WITH EXERTION    . Wears glasses     Current Outpatient Medications  Medication Sig Dispense Refill  . acetaminophen (TYLENOL) 500 MG tablet Take 2 tablets (1,000 mg total) by mouth every 6 (six) hours as needed for moderate pain. 50 tablet 6  . acetaminophen-codeine (TYLENOL #3) 300-30 MG tablet TAKE 1 TABLET BY MOUTH EVERY 8 HOURS AS NEEDED FOR PAIN 40 tablet 0  . albuterol (PROVENTIL HFA;VENTOLIN HFA) 108 (90 Base) MCG/ACT inhaler Inhale 2 puffs into the lungs every 6 (six) hours as needed for wheezing or shortness of breath. 1 Inhaler 0  . Ascorbic Acid (VITAMIN C) 1000 MG tablet Take 1,000 mg by mouth daily.    Marland Kitchen aspirin EC 81 MG tablet Take 1 tablet (81 mg total) by mouth daily.    Marland Kitchen atorvastatin (LIPITOR) 40 MG tablet TAKE 1 TABLET (40 MG TOTAL) BY MOUTH DAILY. 30 tablet 11  . busPIRone (BUSPAR) 7.5 MG tablet TAKE 1 TABLET (7.5 MG TOTAL) BY  MOUTH 2 (TWO) TIMES DAILY. MUST SCHEDULE ANNUAL PHYSICAL 180 tablet 0  . Cholecalciferol (VITAMIN D3) 1000 UNITS CAPS Take 1 capsule by mouth daily.     Marland Kitchen gabapentin (NEURONTIN) 100 MG capsule TAKE 2 CAPSULES BY MOUTH 3 (THREE) TIMES DAILY. 540 capsule 0  . lidocaine (XYLOCAINE) 2 % jelly Apply 1 application topically as needed. Apply to the skin over the knees and hips up to BID 30 mL 6  . loratadine (CLARITIN) 10 MG tablet Take 10 mg by mouth daily.      Marland Kitchen losartan (COZAAR) 100 MG tablet TAKE 1 TABLET (100 MG TOTAL) BY MOUTH DAILY. 30 tablet 8  . meloxicam (MOBIC) 15 MG tablet Take 1 tablet (15 mg total) by mouth daily. 90 tablet 3  . metoprolol tartrate (LOPRESSOR) 25 MG tablet TAKE 1 TABLET BY MOUTH TWICE A DAY 180 tablet 0  . Multiple Vitamin (MULTIVITAMIN) tablet Take 1 tablet by mouth daily.      . promethazine-dextromethorphan (PROMETHAZINE-DM) 6.25-15 MG/5ML syrup Take 5 mLs by mouth 4 (four) times daily as needed for cough. 118 mL 0  . sertraline (ZOLOFT) 50 MG tablet TAKE 2 TABLETS BY MOUTH EVERY DAY 180 tablet 0  . valACYclovir (VALTREX)  500 MG tablet TAKE 1 TABLET BY MOUTH DAILY**CAN INCREASE TO 2 TABS FOR 5 DAY IN THE EVENT OF RECURRENCE** 30 tablet 10   No current facility-administered medications for this visit.     Allergies  Allergen Reactions  . Sulfamethoxazole Swelling    Unspecified area of swelling    Family History  Adopted: Yes  Problem Relation Age of Onset  . Osteoporosis Mother   . Heart disease Mother   . Hypertension Mother   . Hyperlipidemia Mother   . Stroke Mother   . Arthritis Mother   . Diabetes Father   . Heart attack Father   . Cancer Sister        breast  . Colon cancer Neg Hx     Social History   Socioeconomic History  . Marital status: Divorced    Spouse name: Not on file  . Number of children: 1  . Years of education: 2312  . Highest education level: Not on file  Social Needs  . Financial resource strain: Not on file  . Food  insecurity - worry: Not on file  . Food insecurity - inability: Not on file  . Transportation needs - medical: Not on file  . Transportation needs - non-medical: Not on file  Occupational History  . Occupation: Retired  Tobacco Use  . Smoking status: Never Smoker  . Smokeless tobacco: Never Used  Substance and Sexual Activity  . Alcohol use: Yes    Alcohol/week: 1.8 oz    Types: 3 Cans of beer per week  . Drug use: No  . Sexual activity: Yes  Other Topics Concern  . Not on file  Social History Narrative   Lives at home w/ significant other   Right-handed   Drinks 2 cups caffeine per day     Constitutional: Denies fever, malaise, fatigue, headache or abrupt weight changes.  HEENT: Pt reports runny nose, nasal congestion. Denies eye pain, eye redness, ear pain, ringing in the ears, wax buildup, bloody nose, or sore throat. Respiratory: Pt reports cough and mild shortness of breath. Denies difficulty breathing,  or sputum production.   Cardiovascular: Denies chest pain, chest tightness, palpitations or swelling in the hands or feet.   No other specific complaints in a complete review of systems (except as listed in HPI above).  Objective:   Physical Exam   BP 134/66   Pulse 61   Temp 98.2 F (36.8 C) (Oral)   Wt 164 lb 8 oz (74.6 kg)   LMP 01/08/2012   SpO2 94%   BMI 27.80 kg/m  Wt Readings from Last 3 Encounters:  12/10/16 164 lb 8 oz (74.6 kg)  11/19/16 160 lb (72.6 kg)  09/02/16 166 lb (75.3 kg)    General: Appears her stated age, in NAD. HEENT: Head: normal shape and size, no sinus tenderness noted; Ears: Tm's gray and intact, normal light reflex; Nose: mucosa pink and moist, septum midline; Throat/Mouth: Teeth present, mucosa pink and moist, + PND, no exudate, lesions or ulcerations noted.  Neck:  No adenopathy noted Cardiovascular: Normal rate and rhythm. S1,S2 noted.  No murmur, rubs or gallops noted.  Pulmonary/Chest: Normal effort with bilateral expiratory  wheeze noted. No respiratory distress. No rales or ronchi noted.    BMET    Component Value Date/Time   NA 140 11/19/2016 1141   K 5.3 (H) 11/19/2016 1141   CL 105 11/19/2016 1141   CO2 29 11/19/2016 1141   GLUCOSE 111 (H) 11/19/2016 1141  BUN 27 (H) 11/19/2016 1141   CREATININE 0.89 11/19/2016 1141   CREATININE 0.84 07/27/2015 1016   CALCIUM 10.3 11/19/2016 1141   GFRNONAA >60 05/04/2015 0450   GFRAA >60 05/04/2015 0450    Lipid Panel     Component Value Date/Time   CHOL 172 11/19/2016 1141   TRIG 121.0 11/19/2016 1141   HDL 59.30 11/19/2016 1141   CHOLHDL 3 11/19/2016 1141   VLDL 24.2 11/19/2016 1141   LDLCALC 88 11/19/2016 1141    CBC    Component Value Date/Time   WBC 9.2 11/19/2016 1141   RBC 4.19 11/19/2016 1141   HGB 13.5 11/19/2016 1141   HCT 40.6 11/19/2016 1141   PLT 272.0 11/19/2016 1141   MCV 97.0 11/19/2016 1141   MCH 31.8 05/04/2015 0450   MCHC 33.3 11/19/2016 1141   RDW 12.9 11/19/2016 1141   LYMPHSABS 2.0 08/16/2014 1430   MONOABS 0.7 08/16/2014 1430   EOSABS 0.5 08/16/2014 1430   BASOSABS 0.0 08/16/2014 1430    Hgb A1C Lab Results  Component Value Date   HGBA1C 5.8 02/21/2015           Assessment & Plan:   Wheezing:  Likely viral illness eRx for Pred Taper x 6 days Continue Albuterol prn No indication for abx at this time  Hyperkalemia:  Repeat potassium today If high, consider changing Losartan  Return precautions discussed Nicki ReaperBAITY, Amarya Kuehl, NP

## 2016-12-11 MED ORDER — ALBUTEROL SULFATE HFA 108 (90 BASE) MCG/ACT IN AERS: 2.0000 | INHALATION_SPRAY | Freq: Four times a day (QID) | RESPIRATORY_TRACT | 0 refills | Status: DC | PRN

## 2016-12-11 NOTE — Addendum Note (Signed)
Addended by: Roena MaladyEVONTENNO, [place] Inthavong Y on: 12/11/2016 11:58 AM   Modules accepted: Orders

## 2016-12-16 ENCOUNTER — Emergency Department (HOSPITAL_COMMUNITY): Payer: Medicare Other

## 2016-12-16 ENCOUNTER — Encounter (HOSPITAL_COMMUNITY): Payer: Self-pay | Admitting: Emergency Medicine

## 2016-12-16 ENCOUNTER — Telehealth: Payer: Self-pay | Admitting: Internal Medicine

## 2016-12-16 DIAGNOSIS — R0602 Shortness of breath: Secondary | ICD-10-CM | POA: Diagnosis not present

## 2016-12-16 DIAGNOSIS — I10 Essential (primary) hypertension: Secondary | ICD-10-CM | POA: Diagnosis not present

## 2016-12-16 DIAGNOSIS — R55 Syncope and collapse: Principal | ICD-10-CM | POA: Insufficient documentation

## 2016-12-16 DIAGNOSIS — R079 Chest pain, unspecified: Secondary | ICD-10-CM | POA: Diagnosis not present

## 2016-12-16 DIAGNOSIS — Z79899 Other long term (current) drug therapy: Secondary | ICD-10-CM | POA: Diagnosis not present

## 2016-12-16 DIAGNOSIS — Z7982 Long term (current) use of aspirin: Secondary | ICD-10-CM | POA: Diagnosis not present

## 2016-12-16 LAB — CBC
HEMATOCRIT: 38.1 % (ref 36.0–46.0)
HEMOGLOBIN: 12.7 g/dL (ref 12.0–15.0)
MCH: 31.4 pg (ref 26.0–34.0)
MCHC: 33.3 g/dL (ref 30.0–36.0)
MCV: 94.1 fL (ref 78.0–100.0)
Platelets: 305 10*3/uL (ref 150–400)
RBC: 4.05 MIL/uL (ref 3.87–5.11)
RDW: 12.7 % (ref 11.5–15.5)
WBC: 13.9 10*3/uL — ABNORMAL HIGH (ref 4.0–10.5)

## 2016-12-16 LAB — I-STAT TROPONIN, ED: Troponin i, poc: 0 ng/mL (ref 0.00–0.08)

## 2016-12-16 NOTE — ED Triage Notes (Signed)
BIB EMS from home for near syncopal episode, pt also developed central CP with radiation to back during episode. Rates 5/10, pressure like. Given 324ASA, 1 NTG with no relief.

## 2016-12-16 NOTE — Telephone Encounter (Signed)
Copied from CRM [phone]. Topic: Quick Communication - See Telephone Encounter >> Dec 16, 2016  4:21 PM Cipriano BunkerLambe, Annette S wrote: CRM for notification. See Telephone encounter for:  Corrie DandyMary was calling to let Nicki ReaperRegina Baity know she is feeling better. Still has a cough,but it has improved. And she does not think she needs the Z pack at this time.   12/16/16.

## 2016-12-17 ENCOUNTER — Observation Stay (HOSPITAL_COMMUNITY)
Admission: EM | Admit: 2016-12-17 | Discharge: 2016-12-18 | Disposition: A | Discharge status: Home / Self Care | Payer: Medicare Other | Attending: Internal Medicine | Admitting: Internal Medicine

## 2016-12-17 ENCOUNTER — Other Ambulatory Visit: Payer: Self-pay

## 2016-12-17 ENCOUNTER — Encounter (HOSPITAL_COMMUNITY): Payer: Self-pay | Admitting: General Practice

## 2016-12-17 DIAGNOSIS — E860 Dehydration: Secondary | ICD-10-CM | POA: Diagnosis present

## 2016-12-17 DIAGNOSIS — R531 Weakness: Secondary | ICD-10-CM | POA: Diagnosis not present

## 2016-12-17 DIAGNOSIS — F32A Depression, unspecified: Secondary | ICD-10-CM | POA: Diagnosis present

## 2016-12-17 DIAGNOSIS — R55 Syncope and collapse: Secondary | ICD-10-CM

## 2016-12-17 DIAGNOSIS — F329 Major depressive disorder, single episode, unspecified: Secondary | ICD-10-CM | POA: Diagnosis present

## 2016-12-17 DIAGNOSIS — R0789 Other chest pain: Secondary | ICD-10-CM

## 2016-12-17 DIAGNOSIS — I10 Essential (primary) hypertension: Secondary | ICD-10-CM | POA: Diagnosis present

## 2016-12-17 DIAGNOSIS — R079 Chest pain, unspecified: Secondary | ICD-10-CM | POA: Diagnosis present

## 2016-12-17 DIAGNOSIS — E785 Hyperlipidemia, unspecified: Secondary | ICD-10-CM

## 2016-12-17 HISTORY — DX: Pure hypercholesterolemia, unspecified: E78.00

## 2016-12-17 HISTORY — DX: Low back pain, unspecified: M54.50

## 2016-12-17 HISTORY — DX: Low back pain: M54.5

## 2016-12-17 HISTORY — DX: Cerebral infarction, unspecified: I63.9

## 2016-12-17 HISTORY — DX: Other intervertebral disc degeneration, lumbar region without mention of lumbar back pain or lower extremity pain: M51.369

## 2016-12-17 HISTORY — DX: Other intervertebral disc degeneration, lumbar region: M51.36

## 2016-12-17 HISTORY — DX: Other chronic pain: G89.29

## 2016-12-17 HISTORY — DX: Unspecified osteoarthritis, unspecified site: M19.90

## 2016-12-17 LAB — BASIC METABOLIC PANEL
ANION GAP: 7 (ref 5–15)
BUN: 26 mg/dL — ABNORMAL HIGH (ref 6–20)
CALCIUM: 9.6 mg/dL (ref 8.9–10.3)
CHLORIDE: 102 mmol/L (ref 101–111)
CO2: 27 mmol/L (ref 22–32)
Creatinine, Ser: 1.05 mg/dL — ABNORMAL HIGH (ref 0.44–1.00)
GFR calc non Af Amer: 52 mL/min — ABNORMAL LOW (ref >60)
GLUCOSE: 148 mg/dL — AB (ref 65–99)
POTASSIUM: 4.6 mmol/L (ref 3.5–5.1)
Sodium: 136 mmol/L (ref 135–145)

## 2016-12-17 LAB — I-STAT TROPONIN, ED: Troponin i, poc: 0 ng/mL (ref 0.00–0.08)

## 2016-12-17 LAB — GLUCOSE, CAPILLARY: Glucose-Capillary: 119 mg/dL — ABNORMAL HIGH (ref 65–99)

## 2016-12-17 LAB — D-DIMER, QUANTITATIVE (NOT AT ARMC)

## 2016-12-17 LAB — TROPONIN I: Troponin I: <0.03 ng/mL (ref <0.03)

## 2016-12-17 MED ORDER — ACETAMINOPHEN 325 MG PO TABS
650.0000 mg | ORAL_TABLET | ORAL | Status: DC | PRN
  Administered 2016-12-17: 650 mg via ORAL
  Filled 2016-12-17: qty 2

## 2016-12-17 MED ORDER — BUSPIRONE HCL 15 MG PO TABS
7.5000 mg | ORAL_TABLET | Freq: Two times a day (BID) | ORAL | Status: DC
  Administered 2016-12-17 – 2016-12-18 (×2): 7.5 mg via ORAL
  Filled 2016-12-17: qty 1
  Filled 2016-12-17 (×2): qty 2
  Filled 2016-12-17: qty 1
  Filled 2016-12-17: qty 2
  Filled 2016-12-17: qty 1

## 2016-12-17 MED ORDER — LOSARTAN POTASSIUM 50 MG PO TABS
100.0000 mg | ORAL_TABLET | Freq: Every day | ORAL | Status: DC
  Administered 2016-12-17 – 2016-12-18 (×2): 100 mg via ORAL
  Filled 2016-12-17 (×2): qty 2

## 2016-12-17 MED ORDER — ALBUTEROL SULFATE (2.5 MG/3ML) 0.083% IN NEBU: 2.5000 mg | INHALATION_SOLUTION | Freq: Four times a day (QID) | RESPIRATORY_TRACT | Status: DC | PRN

## 2016-12-17 MED ORDER — MORPHINE SULFATE (PF) 4 MG/ML IV SOLN
4.0000 mg | Freq: Once | INTRAVENOUS | Status: AC
  Administered 2016-12-17: 4 mg via INTRAVENOUS
  Filled 2016-12-17: qty 1

## 2016-12-17 MED ORDER — SODIUM CHLORIDE 0.9 % IV SOLN
INTRAVENOUS | Status: DC
  Administered 2016-12-17 – 2016-12-18 (×3): via INTRAVENOUS

## 2016-12-17 MED ORDER — SERTRALINE HCL 100 MG PO TABS
100.0000 mg | ORAL_TABLET | Freq: Every day | ORAL | Status: DC
  Administered 2016-12-17 – 2016-12-18 (×2): 100 mg via ORAL
  Filled 2016-12-17 (×2): qty 1

## 2016-12-17 MED ORDER — LORATADINE 10 MG PO TABS
10.0000 mg | ORAL_TABLET | Freq: Every day | ORAL | Status: DC
  Administered 2016-12-17 – 2016-12-18 (×2): 10 mg via ORAL
  Filled 2016-12-17 (×2): qty 1

## 2016-12-17 MED ORDER — METOPROLOL TARTRATE 25 MG PO TABS
25.0000 mg | ORAL_TABLET | Freq: Two times a day (BID) | ORAL | Status: DC
  Administered 2016-12-17 – 2016-12-18 (×2): 25 mg via ORAL
  Filled 2016-12-17 (×2): qty 1

## 2016-12-17 MED ORDER — ADULT MULTIVITAMIN W/MINERALS CH
1.0000 | ORAL_TABLET | Freq: Every day | ORAL | Status: DC
  Administered 2016-12-17 – 2016-12-18 (×2): 1 via ORAL
  Filled 2016-12-17 (×4): qty 1

## 2016-12-17 MED ORDER — KETOROLAC TROMETHAMINE 15 MG/ML IJ SOLN
15.0000 mg | Freq: Once | INTRAMUSCULAR | Status: AC
  Administered 2016-12-17: 15 mg via INTRAVENOUS
  Filled 2016-12-17: qty 1

## 2016-12-17 MED ORDER — ASPIRIN EC 81 MG PO TBEC
81.0000 mg | DELAYED_RELEASE_TABLET | Freq: Every day | ORAL | Status: DC
  Administered 2016-12-17 – 2016-12-18 (×2): 81 mg via ORAL
  Filled 2016-12-17 (×2): qty 1

## 2016-12-17 MED ORDER — ONDANSETRON HCL 4 MG/2ML IJ SOLN: 4.0000 mg | Freq: Four times a day (QID) | INTRAMUSCULAR | Status: DC | PRN

## 2016-12-17 MED ORDER — ENOXAPARIN SODIUM 40 MG/0.4ML ~~LOC~~ SOLN
40.0000 mg | SUBCUTANEOUS | Status: DC
  Administered 2016-12-17: 40 mg via SUBCUTANEOUS
  Filled 2016-12-17: qty 0.4

## 2016-12-17 MED ORDER — ATORVASTATIN CALCIUM 40 MG PO TABS
40.0000 mg | ORAL_TABLET | Freq: Every day | ORAL | Status: DC
  Administered 2016-12-17 – 2016-12-18 (×2): 40 mg via ORAL
  Filled 2016-12-17 (×2): qty 1

## 2016-12-17 NOTE — ED Provider Notes (Signed)
[facility] EMERGENCY DEPARTMENT Provider Note   CSN: [id number] Arrival date & time: 12/16/16  2312     History   Chief Complaint Chief Complaint  Patient presents with  . Near Syncope  . Chest Pain    HPI SHERITTA DEEG is a 71 y.o. female.  HPI  Pt with hx of HTN, MVP presenting with c/o near syncope as well as chest pain.  Pt states she has been treated for cough and cold symptoms, finished prednisone 2 days ago.  Was starting to feel better, less cough.  Last night she felt faint and laid down on the ground, no syncope but nearly.  She then developed aching central chest pain.  Pain radiated to her back.  She was brought in by EMS and given ASA and nitroglycerin which did not help the pain.  Pain has continued, she states now it is a 4/10.  No nausea or diaphoresis.  She did feel short of breath with the pain.  No leg swelling.  There are no other associated systemic symptoms, there are no other alleviating or modifying factors.   Past Medical History:  Diagnosis Date  . Anxiety   . Chicken pox   . Depression   . Headache 06/23/2015  . Heart murmur    MVP  . History of blood transfusion    1975 - leg fracture  . HOH (hard of hearing)    left  . Hypertension   . MVP (mitral valve prolapse)    echo 1986-mild  . 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 Shortness of breath dyspnea    WITH EXERTION    . Wears glasses     Patient Active Problem List   Diagnosis Date Noted  . Chest pain 12/17/2016  . Hypertension 12/17/2016  . Generalized weakness 12/17/2016  . Depression 12/17/2016  . Dehydration 12/17/2016  . Carotid disease, bilateral (HCC) 03/03/2015  . Chronic back pain 03/03/2014  . Seasonal allergies 03/03/2014  . Genital herpes 03/03/2014  . Hyperlipidemia 03/03/2013  . Anxiety and depression 12/11/2011  . Essential hypertension 11/19/2006    Past  Surgical History:  Procedure Laterality Date  . CARPAL TUNNEL RELEASE Right 03/31/2013   Procedure: RIGHT CARPAL TUNNEL RELEASE;  Surgeon: Nicki Reaper, MD;  Location: [facility];  Service: Orthopedics;  Laterality: Right;  . ENDARTERECTOMY Right 05/03/2015   Procedure: RIGHT CAROTID ENDARTERECTOMY ;  Surgeon: Fransisco Hertz, MD;  Location: [facility] OR;  Service: Vascular;  Laterality: Right;  . NASAL SINUS SURGERY  1975  . PLANTAR FASCIA RELEASE  1968   both feet  . ROTATOR CUFF REPAIR Right 1998  . ROTATOR CUFF REPAIR Right   . TIBIA FRACTURE SURGERY Right 1975   rt  . TONSILLECTOMY  1951  . TUBAL LIGATION  1995    OB History    No data available       Home Medications    Prior to Admission medications   Medication Sig Start Date End Date Taking? Authorizing Provider  acetaminophen (TYLENOL) 500 MG tablet Take 2 tablets (1,000 mg total) by mouth every 6 (six) hours as needed for moderate pain. 11/21/16  Yes Kerrin Champagne, MD  acetaminophen-codeine (TYLENOL #3) 300-30 MG tablet TAKE 1 TABLET BY MOUTH EVERY 8 HOURS AS NEEDED FOR PAIN 11/22/16  Yes Kerrin Champagne, MD  albuterol (PROVENTIL HFA;VENTOLIN HFA) 108 (90 Base) MCG/ACT inhaler Inhale  2 puffs every 6 (six) hours as needed into the lungs for wheezing or shortness of breath. 12/11/16  Yes [name], Regina W, NP  Ascorbic Acid (VITAMIN C) 1000 MG tablet Take 1,000 mg by mouth daily.   Yes [provider]  aspirin EC 81 MG tablet Take 1 tablet (81 mg total) by mouth daily. 03/08/15  Yes Nahser, Deloris PingPhilip J, MD  atorvastatin (LIPITOR) 40 MG tablet TAKE 1 TABLET (40 MG TOTAL) BY MOUTH DAILY. 03/07/16  Yes Nahser, Deloris PingPhilip J, MD  busPIRone (BUSPAR) 7.5 MG tablet TAKE 1 TABLET (7.5 MG TOTAL) BY MOUTH 2 (TWO) TIMES DAILY. MUST SCHEDULE ANNUAL PHYSICAL 11/05/16  Yes [name], Regina W, NP  Cholecalciferol (VITAMIN D3) 1000 UNITS CAPS Take 1 capsule by mouth daily.    Yes [provider]  gabapentin (NEURONTIN) 100 MG capsule  TAKE 2 CAPSULES BY MOUTH 3 (THREE) TIMES DAILY. 12/02/16  Yes York SpanielWillis, Charles K, MD  lidocaine (XYLOCAINE) 2 % jelly Apply 1 application topically as needed. Apply to the skin over the knees and hips up to BID 07/03/16  Yes Kerrin ChampagneNitka, James E, MD  loratadine (CLARITIN) 10 MG tablet Take 10 mg by mouth daily.     Yes [provider]  losartan (COZAAR) 100 MG tablet TAKE 1 TABLET (100 MG TOTAL) BY MOUTH DAILY. 04/22/16  Yes Nahser, Deloris PingPhilip J, MD  meloxicam (MOBIC) 15 MG tablet Take 1 tablet (15 mg total) by mouth daily. 02/21/16  Yes Kerrin ChampagneNitka, James E, MD  metoprolol tartrate (LOPRESSOR) 25 MG tablet TAKE 1 TABLET BY MOUTH TWICE A DAY 12/05/16  Yes Nahser, Deloris PingPhilip J, MD  Multiple Vitamin (MULTIVITAMIN) tablet Take 1 tablet by mouth daily.     Yes [provider]  sertraline (ZOLOFT) 50 MG tablet TAKE 2 TABLETS BY MOUTH EVERY DAY 11/14/16  Yes Baity, Salvadore Oxfordegina W, NP  valACYclovir (VALTREX) 500 MG tablet TAKE 1 TABLET BY MOUTH DAILY**CAN INCREASE TO 2 TABS FOR 5 DAY IN THE EVENT OF RECURRENCE** 09/17/16  Yes Constant, Peggy, MD  promethazine-dextromethorphan (PROMETHAZINE-DM) 6.25-15 MG/5ML syrup Take 5 mLs by mouth 4 (four) times daily as needed for cough. Patient not taking: Reported on 12/17/2016 11/19/16   [name], Regina W, NP    Family History Family History  Adopted: Yes  Problem Relation Age of Onset  . Osteoporosis Mother   . Heart disease Mother   . Hypertension Mother   . Hyperlipidemia Mother   . Stroke Mother   . Arthritis Mother   . Diabetes Father   . Heart attack Father   . Cancer Sister        breast  . Colon cancer Neg Hx     Social History Social History   Tobacco Use  . Smoking status: Never Smoker  . Smokeless tobacco: Never Used  Substance Use Topics  . Alcohol use: Yes    Alcohol/week: 1.8 oz    Types: 3 Cans of beer per week  . Drug use: No     Allergies   Sulfamethoxazole   Review of Systems Review of Systems  ROS reviewed and all otherwise negative  except for mentioned in HPI   Physical Exam Updated Vital Signs BP (!) 138/50   Pulse 74   Temp 97.7 F (36.5 C) (Oral)   Resp 18   Ht 5\' 4"  (1.626 m)   Wt 74.8 kg (165 lb)   LMP 01/08/2012   SpO2 97%   BMI 28.32 kg/m  Vitals reviewed Physical Exam  Physical Examination: General appearance -  alert, well appearing, and in no distress Mental status - alert, oriented to person, place, and time Eyes - no conjunctival injection, no scleral icterus Mouth - mucous membranes moist, pharynx normal without lesions Neck - supple, no significant adenopathy Chest - clear to auscultation, no wheezes, rales or rhonchi, symmetric air entry Heart - normal rate, regular rhythm, normal S1, S2, no murmurs, rubs, clicks or gallops Abdomen - soft, nontender, nondistended, no masses or organomegaly Neurological - alert, oriented, normal speech Extremities - peripheral pulses normal, no pedal edema, no clubbing or cyanosis Skin - normal coloration and turgor, no rashes   ED Treatments / Results  Labs (all labs ordered are listed, but only abnormal results are displayed) Labs Reviewed  BASIC METABOLIC PANEL - Abnormal; Notable for the following components:      Result Value   Glucose, Bld 148 (*)    BUN 26 (*)    Creatinine, Ser 1.05 (*)    GFR calc non Af Amer 52 (*)    All other components within normal limits  CBC - Abnormal; Notable for the following components:   WBC 13.9 (*)    All other components within normal limits  D-DIMER, QUANTITATIVE (NOT AT [facility])  TROPONIN I  TROPONIN I  TROPONIN I  I-STAT TROPONIN, ED  I-STAT TROPONIN, ED    EKG  EKG Interpretation  Date/Time:  Monday December 16 2016 23:21:06 EST Ventricular Rate:  72 PR Interval:  140 QRS Duration: 82 QT Interval:  406 QTC Calculation: 444 R Axis:   69 Text Interpretation:  Normal sinus rhythm Nonspecific ST abnormality Abnormal ECG No significant change since last tracing Confirmed by Jerelyn ScottLinker, Delmo Matty [phone]([id number]) on  12/17/2016 10:15:22 AM       Radiology Dg Chest 2 View  Result Date: 12/17/2016 CLINICAL DATA:  71 year old female with chest pain and shortness of breath. EXAM: CHEST  2 VIEW COMPARISON:  None. FINDINGS: The lungs are clear. There is no pleural effusion or pneumothorax. The cardiac silhouette is within normal limits. There is atherosclerotic calcification of the aortic arch. Osteopenia with degenerative changes of the spine and shoulders. There are atherosclerotic calcification abdominal aorta. No acute osseous pathology. IMPRESSION: No active cardiopulmonary disease. Electronically Signed   By: Elgie CollardArash  Radparvar M.D.   On: 12/17/2016 00:00    Procedures Procedures (including critical care time)  Medications Ordered in ED Medications  0.9 %  sodium chloride infusion ( Intravenous New Bag/Given 12/17/16 1502)  albuterol (PROVENTIL) (2.5 MG/3ML) 0.083% nebulizer solution 2.5 mg (not administered)  aspirin EC tablet 81 mg (81 mg Oral Given 12/17/16 1529)  atorvastatin (LIPITOR) tablet 40 mg (not administered)  busPIRone (BUSPAR) tablet 7.5 mg (not administered)  loratadine (CLARITIN) tablet 10 mg (not administered)  losartan (COZAAR) tablet 100 mg (not administered)  metoprolol tartrate (LOPRESSOR) tablet 25 mg (not administered)  multivitamin tablet 1 tablet (not administered)  sertraline (ZOLOFT) tablet 100 mg (not administered)  acetaminophen (TYLENOL) tablet 650 mg (not administered)  ondansetron (ZOFRAN) injection 4 mg (not administered)  enoxaparin (LOVENOX) injection 40 mg (not administered)  morphine 4 MG/ML injection 4 mg (4 mg Intravenous Given 12/17/16 1141)  ketorolac (TORADOL) 15 MG/ML injection 15 mg (15 mg Intravenous Given 12/17/16 1455)     Initial Impression / Assessment and Plan / ED Course  I have reviewed the triage vital signs and the nursing notes.  Pertinent labs & imaging results that were available during my care of the patient were reviewed by me and  considered in my  medical decision making (see chart for details).     Patient presenting after near syncopal event last night afterwards she developed chest pain.  Her workup is reassuring including labs and EKG.  She has a heart score of 4.  For this reason she was admitted to the hospitalist service for further rule out.  D-dimer is negative PE is unlikely.  I doubt aortic dissection there is no mediastinal widening on chest x-ray.  Further workup and management by hospitalist service.  Final Clinical Impressions(s) / ED Diagnoses   Final diagnoses:  Near syncope  Chest pain, unspecified type    ED Discharge Orders    None       Phillis Haggis, MD 12/17/16 1623

## 2016-12-17 NOTE — H&P (Signed)
History and Physical    Heather [name] ZOX:[id number] DOB: [birth year] DOA: 12/17/2016   PCP: [name], Regina W, NP   Attending physician: Konrad DoloresMerrell  Patient coming from/Resides with: Private residence  Chief Complaint: Generalized weakness with dizziness and chest discomfort  HPI: Heather [name] is a 71 y.o. female with medical history significant for hypertension, mitral valve prolapse, depression with anxiety who presented to the ER with reports of chest discomfort after episode of generalized weakness.  In review of the medical records, patient went to visit her PCP on 11/13 after experiencing upper respiratory symptoms with wheezing.  She was started on a prednisone taper and albuterol.  PCP suspected viral illness.  Follow-up call on 11/19 revealed patient had improvement in her symptoms.  She has subsequently completed her prednisone taper and last used her albuterol inhaler yesterday.  She had fallen asleep on the couch last night and around 9 PM woke up to go to bed.  Upon standing she felt very dizzy, had a headache, felt like the room was spinning.  Because of this she laid down and upon laying down on the floor noticed back, hip, leg and chest discomfort.  She described the chest discomfort as pressure-like.  She became concerned and called EMS.  She was given aspirin and 1 nitroglycerin without any improvement in symptoms at that time.  Her initial EKG and troponins have been unremarkable.  Of note he presented to the triage area at 11:26 PM and has remained either in triage or in the treatment area.  Her chest pain has resolved and is only reproducible with palpation.  She said to normal point-of-care troponins.  She is also been given 1 dose of IV morphine without any improvement in her pain as well.  She is primarily complaining of a headache that was worsened after ingestion of the nitroglycerin.  ED Course:  Vital Signs: BP (!) 160/50   Pulse 76   Temp 97.7 F (36.5 C) (Oral)    Resp 18   Ht 5\' 4"  (1.626 m)   Wt 74.8 kg (165 lb)   LMP 01/08/2012   SpO2 96%   BMI 28.32 kg/m  2 view chest x-ray: Neg Lab data: Sodium 136, potassium 4.6, chloride 102, CO2 27, glucose 148, BUN 26, creatinine 1.05, poc: Negative x2 at 0.00, white count 13,900 differential not obtained, hemoglobin 12.7, platelets 305,000, d-dimer <0.27 Medications and treatments: Morphine 4 mg IV x1  Review of Systems:  In addition to the HPI above,  No Fever-chills, myalgias or other constitutional symptoms No changes with Vision or hearing, new weakness, tingling, numbness in any extremity,  dysarthria or word finding difficulty, gait disturbance or imbalance, tremors or seizure activity No problems swallowing food or Liquids, indigestion/reflux, choking or coughing while eating, abdominal pain with or after eating No Cough or Shortness of Breath, palpitations, orthopnea or DOE No Abdominal pain, N/V, melena,hematochezia, dark tarry stools, constipation No dysuria, malodorous urine, hematuria or flank pain No new skin rashes, lesions, masses or bruises, No new joint pains, aches, swelling or redness No recent unintentional weight gain or loss No polyuria, polydypsia or polyphagia   Past Medical History:  Diagnosis Date  . Anxiety   . Chicken pox   . Depression   . Headache 06/23/2015  . Heart murmur    MVP  . History of blood transfusion    1975 - leg fracture  . HOH (hard of hearing)    left  . Hypertension   .  MVP (mitral valve prolapse)    echo 1986-mild  . 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 Shortness of breath dyspnea    WITH EXERTION    . Wears glasses     Past Surgical History:  Procedure Laterality Date  . CARPAL TUNNEL RELEASE Right 03/31/2013   Procedure: RIGHT CARPAL TUNNEL RELEASE;  Surgeon: Nicki Reaper, MD;  Location: [facility];  Service: Orthopedics;   Laterality: Right;  . ENDARTERECTOMY Right 05/03/2015   Procedure: RIGHT CAROTID ENDARTERECTOMY ;  Surgeon: Fransisco Hertz, MD;  Location: [facility] OR;  Service: Vascular;  Laterality: Right;  . NASAL SINUS SURGERY  1975  . PLANTAR FASCIA RELEASE  1968   both feet  . ROTATOR CUFF REPAIR Right 1998  . ROTATOR CUFF REPAIR Right   . TIBIA FRACTURE SURGERY Right 1975   rt  . TONSILLECTOMY  1951  . TUBAL LIGATION  1995    Social History   Socioeconomic History  . Marital status: Divorced    Spouse name: Not on file  . Number of children: 1  . Years of education: 44  . Highest education level: Not on file  Social Needs  . Financial resource strain: Not on file  . Food insecurity - worry: Not on file  . Food insecurity - inability: Not on file  . Transportation needs - medical: Not on file  . Transportation needs - non-medical: Not on file  Occupational History  . Occupation: Retired  Tobacco Use  . Smoking status: Never Smoker  . Smokeless tobacco: Never Used  Substance and Sexual Activity  . Alcohol use: Yes    Alcohol/week: 1.8 oz    Types: 3 Cans of beer per week  . Drug use: No  . Sexual activity: Yes  Other Topics Concern  . Not on file  Social History Narrative   Lives at home w/ significant other   Right-handed   Drinks 2 cups caffeine per day    Mobility: Recommended to utilize rolling walker but does not Work history: Not obtained   Allergies  Allergen Reactions  . Sulfamethoxazole Swelling    Unspecified area of swelling    Family History  Adopted: Yes  Problem Relation Age of Onset  . Osteoporosis Mother   . Heart disease Mother   . Hypertension Mother   . Hyperlipidemia Mother   . Stroke Mother   . Arthritis Mother   . Diabetes Father   . Heart attack Father   . Cancer Sister        breast  . Colon cancer Neg Hx      Prior to Admission medications   Medication Sig Start Date End Date Taking? Authorizing Provider  acetaminophen (TYLENOL) 500 MG  tablet Take 2 tablets (1,000 mg total) by mouth every 6 (six) hours as needed for moderate pain. 11/21/16  Yes Kerrin Champagne, MD  acetaminophen-codeine (TYLENOL #3) 300-30 MG tablet TAKE 1 TABLET BY MOUTH EVERY 8 HOURS AS NEEDED FOR PAIN 11/22/16  Yes Kerrin Champagne, MD  albuterol (PROVENTIL HFA;VENTOLIN HFA) 108 (90 Base) MCG/ACT inhaler Inhale 2 puffs every 6 (six) hours as needed into the lungs for wheezing or shortness of breath. 12/11/16  Yes Lorre Munroe, NP  Ascorbic Acid (VITAMIN C) 1000 MG tablet Take 1,000 mg by mouth daily.   Yes [provider]  aspirin EC 81 MG tablet Take 1 tablet (81 mg total)  by mouth daily. 03/08/15  Yes Nahser, Deloris Ping, MD  atorvastatin (LIPITOR) 40 MG tablet TAKE 1 TABLET (40 MG TOTAL) BY MOUTH DAILY. 03/07/16  Yes Nahser, Deloris Ping, MD  busPIRone (BUSPAR) 7.5 MG tablet TAKE 1 TABLET (7.5 MG TOTAL) BY MOUTH 2 (TWO) TIMES DAILY. MUST SCHEDULE ANNUAL PHYSICAL 11/05/16  Yes Lorre Munroe, NP  Cholecalciferol (VITAMIN D3) 1000 UNITS CAPS Take 1 capsule by mouth daily.    Yes [provider]  gabapentin (NEURONTIN) 100 MG capsule TAKE 2 CAPSULES BY MOUTH 3 (THREE) TIMES DAILY. 12/02/16  Yes York Spaniel, MD  lidocaine (XYLOCAINE) 2 % jelly Apply 1 application topically as needed. Apply to the skin over the knees and hips up to BID 07/03/16  Yes Kerrin Champagne, MD  loratadine (CLARITIN) 10 MG tablet Take 10 mg by mouth daily.     Yes [provider]  losartan (COZAAR) 100 MG tablet TAKE 1 TABLET (100 MG TOTAL) BY MOUTH DAILY. 04/22/16  Yes Nahser, Deloris Ping, MD  meloxicam (MOBIC) 15 MG tablet Take 1 tablet (15 mg total) by mouth daily. 02/21/16  Yes Kerrin Champagne, MD  metoprolol tartrate (LOPRESSOR) 25 MG tablet TAKE 1 TABLET BY MOUTH TWICE A DAY 12/05/16  Yes Nahser, Deloris Ping, MD  Multiple Vitamin (MULTIVITAMIN) tablet Take 1 tablet by mouth daily.     Yes [provider]  sertraline (ZOLOFT) 50 MG tablet TAKE 2 TABLETS BY MOUTH EVERY  DAY 11/14/16  Yes Baity, Salvadore Oxford, NP  valACYclovir (VALTREX) 500 MG tablet TAKE 1 TABLET BY MOUTH DAILY**CAN INCREASE TO 2 TABS FOR 5 DAY IN THE EVENT OF RECURRENCE** 09/17/16  Yes Constant, Peggy, MD  promethazine-dextromethorphan (PROMETHAZINE-DM) 6.25-15 MG/5ML syrup Take 5 mLs by mouth 4 (four) times daily as needed for cough. Patient not taking: Reported on 12/17/2016 11/19/16   Lorre Munroe, NP    Physical Exam: Vitals:   12/17/16 1333 12/17/16 1334 12/17/16 1335 12/17/16 1345  BP: (!) 147/68  (!) 161/54 (!) 160/50  Pulse: 88 86 86 76  Resp:      Temp:      TempSrc:      SpO2: 91% 100% 100% 96%  Weight:      Height:          Constitutional: NAD, somewhat anxious, comfortable Eyes: PERRL, lids and conjunctivae normal ENMT: Mucous membranes are moist. Posterior pharynx clear of any exudate or lesions.poor dentition.  Neck: normal, supple, no masses, no thyromegaly Respiratory: clear to auscultation bilaterally, no wheezing, no crackles. Normal respiratory effort. No accessory muscle use.  Chest wall pain reproducible with palpation over anterior chest. Cardiovascular: Slightly irregular rate and rhythm, no murmurs / rubs / gallops. No extremity edema. 2+ pedal pulses. No carotid bruits.  Abdomen: no tenderness, no masses palpated. No hepatosplenomegaly. Bowel sounds positive.  Musculoskeletal: no clubbing / cyanosis. No joint deformity upper and lower extremities. Good ROM, no contractures. Normal muscle tone.  Skin: no rashes, lesions, ulcers. No induration Neurologic: CN 2-12 grossly intact. Sensation intact, DTR normal. Strength 5/5 x all 4 extremities.  Psychiatric: Normal judgment and insight. Alert and oriented x 3.  Mildly anxious mood.    Labs on Admission: I have personally reviewed following labs and imaging studies  CBC: Recent Labs  Lab 12/16/16 2329  WBC 13.9*  HGB 12.7  HCT 38.1  MCV 94.1  PLT 305   Basic Metabolic Panel: Recent Labs  Lab  12/16/16 2329  NA 136  K 4.6  CL 102  CO2 27  GLUCOSE 148*  BUN 26*  CREATININE 1.05*  CALCIUM 9.6   GFR: Estimated Creatinine Clearance: 48.6 mL/min (A) (by C-G formula based on SCr of 1.05 mg/dL (H)). Liver Function Tests: No results for input(s): AST, ALT, ALKPHOS, BILITOT, PROT, ALBUMIN in the last 168 hours. No results for input(s): LIPASE, AMYLASE in the last 168 hours. No results for input(s): AMMONIA in the last 168 hours. Coagulation Profile: No results for input(s): INR, PROTIME in the last 168 hours. Cardiac Enzymes: No results for input(s): CKTOTAL, CKMB, CKMBINDEX, TROPONINI in the last 168 hours. BNP (last 3 results) No results for input(s): PROBNP in the last 8760 hours. HbA1C: No results for input(s): HGBA1C in the last 72 hours. CBG: No results for input(s): GLUCAP in the last 168 hours. Lipid Profile: No results for input(s): CHOL, HDL, LDLCALC, TRIG, CHOLHDL, LDLDIRECT in the last 72 hours. Thyroid Function Tests: No results for input(s): TSH, T4TOTAL, FREET4, T3FREE, THYROIDAB in the last 72 hours. Anemia Panel: No results for input(s): VITAMINB12, FOLATE, FERRITIN, TIBC, IRON, RETICCTPCT in the last 72 hours. Urine analysis:    Component Value Date/Time   BILIRUBINUR NEG 08/17/2012 1403   PROTEINUR TRACE 08/17/2012 1403   UROBILINOGEN 0.2 08/17/2012 1403   NITRITE NEG 08/17/2012 1403   LEUKOCYTESUR moderate (2+) 08/17/2012 1403   Sepsis Labs: @LABRCNTIP (procalcitonin:4,lacticidven:4) )No results found for this or any previous visit (from the past 240 hour(s)).   Radiological Exams on Admission: Dg Chest 2 View  Result Date: 12/17/2016 CLINICAL DATA:  71 year old female with chest pain and shortness of breath. EXAM: CHEST  2 VIEW COMPARISON:  None. FINDINGS: The lungs are clear. There is no pleural effusion or pneumothorax. The cardiac silhouette is within normal limits. There is atherosclerotic calcification of the aortic arch. Osteopenia with  degenerative changes of the spine and shoulders. There are atherosclerotic calcification abdominal aorta. No acute osseous pathology. IMPRESSION: No active cardiopulmonary disease. Electronically Signed   By: Elgie CollardArash  Radparvar M.D.   On: 12/17/2016 00:00    EKG: (Independently reviewed) sinus rhythm with ventricular rate 72 bpm, QTC 444 ms, early repolarization in the inferior lateral leads, normal R wave rotation, no acute ischemic changes and essentially unchanged from previous EKG from 11/19/16  Assessment/Plan Principal Problem:   Chest pain -Patient presents with atypical chest pain -after episode of generalized weakness/near syncopal episode-also recent upper respiratory infection with cough now resolved -Chest pain reproduced with palpation-Toradol 15 mg IV x1 dose given mild renal insufficiency -Underwent gated Myoview perfusion study on 03/15/15: Low risk Myoview, no ischemia, normal EF and no RWMA -For completeness of evaluation will cycle troponin and obtain echocardiogram -Has +FH of CAD noting both parents with MI as well as maternal grandfather and paternal grandmother -Continue telemetry monitoring -Continue preadmission baby aspirin, statin and beta-blocker -HEART Score for Major Cardiac Event History: Slightly suspicious (0 points) ECG: Normal (0 points) Age: 3865 years or older (2 points) Risk factors: 1 or 2 fisk factors (1 point) Troponin: Normal limit or below (0 points) Total: 3 points. 1.6% risk of MACE.  Active Problems:   Generalized weakness/Dehydration -Patient presents with dizziness and generalized weakness after recent upper respiratory infection; has associated mild renal insufficiency -Normal saline IV at 100 cc/hr -OVS neg -Mobilize with assistance -Repeat electrolytes in a.m.    Hypertension -Continue preadmission Cozaar and Lopressor    HLD -Continue preadmission Lipitor    Depression -Continue preadmission BuSpar and Zoloft      DVT  prophylaxis: Lovenox Code Status: Full Family Communication: Friend at bedside with patient's permission Disposition Plan: Home Consults called: None    ELLIS,ALLISON L. ANP-BC [facility] Pager [phone]   If 7PM-7AM, please contact night-coverage [email or web address] Password [email or web address]  12/17/2016, 2:13 PM

## 2016-12-17 NOTE — Telephone Encounter (Signed)
noted 

## 2016-12-18 ENCOUNTER — Observation Stay (HOSPITAL_BASED_OUTPATIENT_CLINIC_OR_DEPARTMENT_OTHER): Payer: Medicare Other

## 2016-12-18 DIAGNOSIS — I361 Nonrheumatic tricuspid (valve) insufficiency: Secondary | ICD-10-CM | POA: Diagnosis not present

## 2016-12-18 DIAGNOSIS — R072 Precordial pain: Secondary | ICD-10-CM

## 2016-12-18 DIAGNOSIS — R55 Syncope and collapse: Secondary | ICD-10-CM

## 2016-12-18 DIAGNOSIS — R079 Chest pain, unspecified: Secondary | ICD-10-CM

## 2016-12-18 DIAGNOSIS — R531 Weakness: Secondary | ICD-10-CM | POA: Diagnosis not present

## 2016-12-18 LAB — ECHOCARDIOGRAM COMPLETE
Height: 64 in
WEIGHTICAEL: 2627.88 [oz_av]

## 2016-12-18 LAB — TROPONIN I: Troponin I: <0.03 ng/mL (ref <0.03)

## 2016-12-18 MED ORDER — PANTOPRAZOLE SODIUM 40 MG PO TBEC: 40.0000 mg | DELAYED_RELEASE_TABLET | Freq: Every day | ORAL | 0 refills | Status: DC

## 2016-12-18 MED ORDER — GUAIFENESIN ER 600 MG PO TB12
600.0000 mg | ORAL_TABLET | Freq: Two times a day (BID) | ORAL | Status: DC
Start: 2016-12-18 — End: 2016-12-18
  Administered 2016-12-18: 600 mg via ORAL
  Filled 2016-12-18: qty 1

## 2016-12-18 MED ORDER — GUAIFENESIN-DM 100-10 MG/5ML PO SYRP: 5.0000 mL | ORAL_SOLUTION | ORAL | Status: DC | PRN

## 2016-12-18 MED ORDER — GUAIFENESIN ER 600 MG PO TB12: 600.0000 mg | ORAL_TABLET | Freq: Two times a day (BID) | ORAL | 0 refills | Status: DC

## 2016-12-18 MED ORDER — PANTOPRAZOLE SODIUM 40 MG PO TBEC
40.0000 mg | DELAYED_RELEASE_TABLET | Freq: Every day | ORAL | Status: DC
  Administered 2016-12-18: 40 mg via ORAL
  Filled 2016-12-18: qty 1

## 2016-12-18 NOTE — Discharge Summary (Signed)
Physician Discharge Summary  Heather [name] ZOX:[id number] DOB: 1945-12-11 DOA: 12/17/2016  PCP: [name], Heather W, NP  Admit date: 12/17/2016 Discharge date: 12/18/2016  Admitted From: Home  Disposition:  Home   Recommendations for Outpatient Follow-up:  1. Follow up with PCP in 1-2 weeks 2. Please obtain BMP/CBC in one week 3. Follow up with cardiology for stress test out patient.   Home Health: no  Discharge Condition: stable.  CODE STATUS: full code.  Diet recommendation: Heart Healthy   Brief/Interim Summary:  Heather AlertMary J [name] is a 71 y.o. female with medical history significant for hypertension, mitral valve prolapse, depression with anxiety who presented to the ER with reports of chest discomfort after episode of generalized weakness.  In review of the medical records, patient went to visit her PCP on 11/13 after experiencing upper respiratory symptoms with wheezing.  She was started on a prednisone taper and albuterol.  PCP suspected viral illness.  Follow-up call on 11/19 revealed patient had improvement in her symptoms.  She has subsequently completed her prednisone taper and last used her albuterol inhaler yesterday.  She had fallen asleep on the couch last night and around 9 PM woke up to go to bed.  Upon standing she felt very dizzy, had a headache, felt like the room was spinning.  Because of this she laid down and upon laying down on the floor noticed back, hip, leg and chest discomfort.  She described the chest discomfort as pressure-like.  She became concerned and called EMS.  She was given aspirin and 1 nitroglycerin without any improvement in symptoms at that time.  Her initial EKG and troponins have been unremarkable.  Of note he presented to the triage area at 11:26 PM and has remained either in triage or in the treatment area.  Her chest pain has resolved and is only reproducible with palpation.  She said to normal point-of-care troponins.  She is also been given 1 dose of IV  morphine without any improvement in her pain as well.  She is primarily complaining of a headache that was worsened after ingestion of the nitroglycerin.  ED Course:  Vital Signs: BP (!) 160/50   Pulse 76   Temp 97.7 F (36.5 C) (Oral)   Resp 18   Ht 5\' 4"  (1.626 m)   Wt 74.8 kg (165 lb)   LMP 01/08/2012   SpO2 96%   BMI 28.32 kg/m  2 view chest x-ray: Neg Lab data: Sodium 136, potassium 4.6, chloride 102, CO2 27, glucose 148, BUN 26, creatinine 1.05, poc: Negative x2 at 0.00, white count 13,900 differential not obtained, hemoglobin 12.7, platelets 305,000, d-dimer <0.27 Medications and treatments: Morphine 4 mg IV x1   1-Chest pain; resolved.  ECHO normal EF. Troponin negative.  Plan for stress test out patient.   2-weakness; generalized; related to dehydration. Resolved.   3-URI; complaints of cough, wheezing.  Started guaifenesin. Continue with nebulizer.  Chest x ray negative for PNA.   Hypertension -Continue preadmission Cozaar and Lopressor    HLD -Continue preadmission Lipitor    Depression -Continue preadmission BuSpar and Zoloft       Discharge Diagnoses:  Principal Problem:   Chest pain Active Problems:   Hypertension   Generalized weakness   Depression   Dehydration    Discharge Instructions  Discharge Instructions    Diet - low sodium heart healthy   Complete by:  As directed    Increase activity slowly   Complete by:  As directed  Allergies as of 12/18/2016      Reactions   Sulfamethoxazole Swelling   Unspecified area of swelling      Medication List    STOP taking these medications   promethazine-dextromethorphan 6.25-15 MG/5ML syrup Commonly known as:  PROMETHAZINE-DM     TAKE these medications   acetaminophen 500 MG tablet Commonly known as:  TYLENOL Take 2 tablets (1,000 mg total) by mouth every 6 (six) hours as needed for moderate pain.   acetaminophen-codeine 300-30 MG tablet Commonly known as:  TYLENOL  #3 TAKE 1 TABLET BY MOUTH EVERY 8 HOURS AS NEEDED FOR PAIN   albuterol 108 (90 Base) MCG/ACT inhaler Commonly known as:  PROVENTIL HFA;VENTOLIN HFA Inhale 2 puffs every 6 (six) hours as needed into the lungs for wheezing or shortness of breath.   aspirin EC 81 MG tablet Take 1 tablet (81 mg total) by mouth daily.   atorvastatin 40 MG tablet Commonly known as:  LIPITOR TAKE 1 TABLET (40 MG TOTAL) BY MOUTH DAILY.   busPIRone 7.5 MG tablet Commonly known as:  BUSPAR TAKE 1 TABLET (7.5 MG TOTAL) BY MOUTH 2 (TWO) TIMES DAILY. MUST SCHEDULE ANNUAL PHYSICAL   gabapentin 100 MG capsule Commonly known as:  NEURONTIN TAKE 2 CAPSULES BY MOUTH 3 (THREE) TIMES DAILY.   guaiFENesin 600 MG 12 hr tablet Commonly known as:  MUCINEX Take 1 tablet (600 mg total) by mouth 2 (two) times daily.   lidocaine 2 % jelly Commonly known as:  XYLOCAINE Apply 1 application topically as needed. Apply to the skin over the knees and hips up to BID   loratadine 10 MG tablet Commonly known as:  CLARITIN Take 10 mg by mouth daily.   losartan 100 MG tablet Commonly known as:  COZAAR TAKE 1 TABLET (100 MG TOTAL) BY MOUTH DAILY.   meloxicam 15 MG tablet Commonly known as:  MOBIC Take 1 tablet (15 mg total) by mouth daily.   metoprolol tartrate 25 MG tablet Commonly known as:  LOPRESSOR TAKE 1 TABLET BY MOUTH TWICE A DAY   multivitamin tablet Take 1 tablet by mouth daily.   pantoprazole 40 MG tablet Commonly known as:  PROTONIX Take 1 tablet (40 mg total) by mouth daily at 6 (six) AM. Start taking on:  12/19/2016   sertraline 50 MG tablet Commonly known as:  ZOLOFT TAKE 2 TABLETS BY MOUTH EVERY DAY   valACYclovir 500 MG tablet Commonly known as:  VALTREX TAKE 1 TABLET BY MOUTH DAILY**CAN INCREASE TO 2 TABS FOR 5 DAY IN THE EVENT OF RECURRENCE**   vitamin C 1000 MG tablet Take 1,000 mg by mouth daily.   Vitamin D3 1000 units Caps Take 1 capsule by mouth daily.       Allergies  Allergen  Reactions  . Sulfamethoxazole Swelling    Unspecified area of swelling    Consultations:  Cardiology    Procedures/Studies: Dg Chest 2 View  Result Date: 12/17/2016 CLINICAL DATA:  71 year old female with chest pain and shortness of breath. EXAM: CHEST  2 VIEW COMPARISON:  None. FINDINGS: The lungs are clear. There is no pleural effusion or pneumothorax. The cardiac silhouette is within normal limits. There is atherosclerotic calcification of the aortic arch. Osteopenia with degenerative changes of the spine and shoulders. There are atherosclerotic calcification abdominal aorta. No acute osseous pathology. IMPRESSION: No active cardiopulmonary disease. Electronically Signed   By: Elgie Collard M.D.   On: 12/17/2016 00:00       Subjective: She is chest pain  free. Cough persist,. Report mild headaches, sinus.   Discharge Exam: Vitals:   12/18/16 1143 12/18/16 1413  BP: 112/64 (!) 136/46  Pulse: 60 67  Resp: 17 18  Temp: 98.5 F (36.9 C) 98.3 F (36.8 C)  SpO2: 98% 98%   Vitals:   12/18/16 0540 12/18/16 0800 12/18/16 1143 12/18/16 1413  BP: (!) 110/43 (!) 135/48 112/64 (!) 136/46  Pulse: 72 67 60 67  Resp: 16 18 17 18   Temp: 97.8 F (36.6 C) 98.1 F (36.7 C) 98.5 F (36.9 C) 98.3 F (36.8 C)  TempSrc: Oral Oral Oral Oral  SpO2: 98% 100% 98% 98%  Weight: 74.5 kg (164 lb 3.9 oz)     Height:        General: Pt is alert, awake, not in acute distress Cardiovascular: RRR, S1/S2 +, no rubs, no gallops Respiratory: CTA bilaterally, no wheezing, no rhonchi Abdominal: Soft, NT, ND, bowel sounds + Extremities: no edema, no cyanosis    The results of significant diagnostics from this hospitalization (including imaging, microbiology, ancillary and laboratory) are listed below for reference.     Microbiology: No results found for this or any previous visit (from the past 240 hour(s)).   Labs: BNP (last 3 results) No results for input(s): BNP in the last 8760  hours. Basic Metabolic Panel: Recent Labs  Lab 12/16/16 2329  NA 136  K 4.6  CL 102  CO2 27  GLUCOSE 148*  BUN 26*  CREATININE 1.05*  CALCIUM 9.6   Liver Function Tests: No results for input(s): AST, ALT, ALKPHOS, BILITOT, PROT, ALBUMIN in the last 168 hours. No results for input(s): LIPASE, AMYLASE in the last 168 hours. No results for input(s): AMMONIA in the last 168 hours. CBC: Recent Labs  Lab 12/16/16 2329  WBC 13.9*  HGB 12.7  HCT 38.1  MCV 94.1  PLT 305   Cardiac Enzymes: Recent Labs  Lab 12/17/16 1530 12/17/16 1849 12/18/16 0217  TROPONINI <0.03 <0.03 <0.03   BNP: Invalid input(s): POCBNP CBG: Recent Labs  Lab 12/17/16 2208  GLUCAP 119*   D-Dimer Recent Labs    12/17/16 1057  DDIMER <0.27   Hgb A1c No results for input(s): HGBA1C in the last 72 hours. Lipid Profile No results for input(s): CHOL, HDL, LDLCALC, TRIG, CHOLHDL, LDLDIRECT in the last 72 hours. Thyroid function studies No results for input(s): TSH, T4TOTAL, T3FREE, THYROIDAB in the last 72 hours.  Invalid input(s): FREET3 Anemia work up No results for input(s): VITAMINB12, FOLATE, FERRITIN, TIBC, IRON, RETICCTPCT in the last 72 hours. Urinalysis    Component Value Date/Time   BILIRUBINUR NEG 08/17/2012 1403   PROTEINUR TRACE 08/17/2012 1403   UROBILINOGEN 0.2 08/17/2012 1403   NITRITE NEG 08/17/2012 1403   LEUKOCYTESUR moderate (2+) 08/17/2012 1403   Sepsis Labs Invalid input(s): PROCALCITONIN,  WBC,  LACTICIDVEN Microbiology No results found for this or any previous visit (from the past 240 hour(s)).   Time coordinating discharge: Over 30 minutes  SIGNED:   Alba CoryBelkys A Regalado, MD  [facility] 12/18/2016, 4:33 PM Pager   If 7PM-7AM, please contact night-coverage [email or web address] Password [email or web address]

## 2016-12-18 NOTE — Discharge Instructions (Signed)
The Cardiologist Office will contact you to schedule a follow-up cardiac workup.

## 2016-12-18 NOTE — Consult Note (Signed)
Cardiology Consultation:   Patient ID: Heather AlertMary J Manetta; [id number]; [birth year]   Admit date: 12/17/2016 Date of Consult: 12/18/2016  Primary Care Provider: [name], Regina W, NP Primary Cardiologist: Dr Elease HashimotoNahser   Patient Profile:   Heather [name] is a 71 y.o. female with a hx of PVD who is being seen today for the evaluation of chest pain at the request of Regalado.  History of Present Illness:   Heather [name] is a pleasant 71 y/o female who was followed by Dr Aleen Campiysinger in the past with a history of MVP. She has never had an MI or cath. She saw Dr Elease HashimotoNahser for pre op clearance in Feb 2017 prior to Rt CEA. Myoview then was low risk and she had her carotid surgery without complication. Other medical problems include HLD, HTN, anxiety, and chronic back pain.   The pt apparently developed a URI 1-2 weeks ago. She has been wheezing, new for her. She saw her PCP and was given a steroid dose pack which she finished Sunday. Yesterday she had been sitting in her recliner. When she got up she had near syncope. She laid on the floor and had nausea but not vomiting or diaphoresis. She then developed mid sternal epigastric pain that radiated to her back. EMS was called. She received a SL NTG with no improvement. In the ED she was felt to be dehydrated (BUN 26/SCr 1.05) and was hydrated. She had this discomfort for several hours. Her EKG and Troponin are normal.  She has not had chest pain like this before.   Past Medical History:  Diagnosis Date  . Anxiety   . Arthritis    "knees, right hip, lumbar back" (12/17/2016)  . Chicken pox   . Chronic lower back pain   . DDD (degenerative disc disease), lumbar   . Depression   . Headache 06/23/2015   "2d after carotid OR & again today" (12/17/2016)  . Heart murmur    MVP  . High cholesterol   . History of blood transfusion 1975   "related to femur  fracture" (12/17/2016)  . HOH (hard of hearing)    left  . Hypertension   . MVP (mitral valve prolapse)    echo 1986-mild  . 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 Shortness of breath dyspnea    WITH EXERTION    . Stroke [facility](HCC) 04/2015   "light stroke; sometimes my thinking is slow since" (12/17/2016)  . Wears glasses     Past Surgical History:  Procedure Laterality Date  . CARPAL TUNNEL RELEASE Right 03/31/2013   Procedure: RIGHT CARPAL TUNNEL RELEASE;  Surgeon: Nicki ReaperGary R Kuzma, MD;  Location: [facility];  Service: Orthopedics;  Laterality: Right;  . ENDARTERECTOMY Right 05/03/2015   Procedure: RIGHT CAROTID ENDARTERECTOMY ;  Surgeon: Fransisco HertzBrian L Chen, MD;  [facility];  Service: Vascular;  Laterality: Right;  . FEMUR FRACTURE SURGERY Right 1975   "hip"  . FRACTURE SURGERY    . NASAL SINUS SURGERY  1975  . PLANTAR FASCIA RELEASE Bilateral 1968  . SHOULDER OPEN ROTATOR CUFF REPAIR Right 1998  . TONSILLECTOMY  1951  . TUBAL LIGATION  1995     Home Medications:  Prior to Admission medications   Medication Sig Start Date End Date Taking? Authorizing Provider  acetaminophen (TYLENOL) 500 MG tablet Take 2 tablets (1,000 mg total) by mouth every 6 (six) hours as needed for moderate pain. 11/21/16  Yes Kerrin ChampagneNitka, James E, MD  acetaminophen-codeine (TYLENOL #3) 300-30 MG tablet TAKE 1 TABLET BY MOUTH EVERY 8 HOURS AS NEEDED FOR PAIN 11/22/16  Yes Kerrin ChampagneNitka, James E, MD  albuterol (PROVENTIL HFA;VENTOLIN HFA) 108 (90 Base) MCG/ACT inhaler Inhale 2 puffs every 6 (six) hours as needed into the lungs for wheezing or shortness of breath. 12/11/16  Yes [name], Regina W, NP  Ascorbic Acid (VITAMIN C) 1000 MG tablet Take 1,000 mg by mouth daily.   Yes [provider]  aspirin EC 81 MG tablet Take 1 tablet (81 mg total) by mouth daily. 03/08/15  Yes Nahser, Deloris PingPhilip J, MD  atorvastatin (LIPITOR) 40 MG tablet TAKE 1 TABLET (40 MG TOTAL) BY MOUTH DAILY. 03/07/16  Yes Nahser, Deloris PingPhilip J, MD  busPIRone (BUSPAR) 7.5 MG  tablet TAKE 1 TABLET (7.5 MG TOTAL) BY MOUTH 2 (TWO) TIMES DAILY. MUST SCHEDULE ANNUAL PHYSICAL 11/05/16  Yes [name], Regina W, NP  Cholecalciferol (VITAMIN D3) 1000 UNITS CAPS Take 1 capsule by mouth daily.    Yes [provider]  gabapentin (NEURONTIN) 100 MG capsule TAKE 2 CAPSULES BY MOUTH 3 (THREE) TIMES DAILY. 12/02/16  Yes York SpanielWillis, Charles K, MD  lidocaine (XYLOCAINE) 2 % jelly Apply 1 application topically as needed. Apply to the skin over the knees and hips up to BID 07/03/16  Yes Kerrin ChampagneNitka, James E, MD  loratadine (CLARITIN) 10 MG tablet Take 10 mg by mouth daily.     Yes [provider]  losartan (COZAAR) 100 MG tablet TAKE 1 TABLET (100 MG TOTAL) BY MOUTH DAILY. 04/22/16  Yes Nahser, Deloris PingPhilip J, MD  meloxicam (MOBIC) 15 MG tablet Take 1 tablet (15 mg total) by mouth daily. 02/21/16  Yes Kerrin ChampagneNitka, James E, MD  metoprolol tartrate (LOPRESSOR) 25 MG tablet TAKE 1 TABLET BY MOUTH TWICE A DAY 12/05/16  Yes Nahser, Deloris PingPhilip J, MD  Multiple Vitamin (MULTIVITAMIN) tablet Take 1 tablet by mouth daily.     Yes [provider]  sertraline (ZOLOFT) 50 MG tablet TAKE 2 TABLETS BY MOUTH EVERY DAY 11/14/16  Yes Baity, Salvadore Oxfordegina W, NP  valACYclovir (VALTREX) 500 MG tablet TAKE 1 TABLET BY MOUTH DAILY**CAN INCREASE TO 2 TABS FOR 5 DAY IN THE EVENT OF RECURRENCE** 09/17/16  Yes Constant, Peggy, MD  promethazine-dextromethorphan (PROMETHAZINE-DM) 6.25-15 MG/5ML syrup Take 5 mLs by mouth 4 (four) times daily as needed for cough. Patient not taking: Reported on 12/17/2016 11/19/16   [name], Regina W, NP    Inpatient Medications: Scheduled Meds: . aspirin EC  81 mg Oral Daily  . atorvastatin  40 mg Oral q1800  . busPIRone  7.5 mg Oral BID  . enoxaparin (LOVENOX) injection  40 mg Subcutaneous Q24H  . loratadine  10 mg Oral Daily  . losartan  100 mg Oral Daily  . metoprolol tartrate  25 mg Oral BID  . multivitamin with minerals  1 tablet Oral Daily  . sertraline  100 mg Oral Daily   Continuous  Infusions: . sodium chloride 100 mL/hr at 12/18/16 0543   PRN Meds: acetaminophen, albuterol, ondansetron (ZOFRAN) IV  Allergies:    Allergies  Allergen Reactions  . Sulfamethoxazole Swelling    Unspecified area of swelling    Social History:   Social History   Socioeconomic History  . Marital status: Divorced    Spouse name: Not on file  . Number of children: 1  . Years of education: 5412  . Highest education level: Not on file  Social Needs  . Financial resource strain:  Not on file  . Food insecurity - worry: Not on file  . Food insecurity - inability: Not on file  . Transportation needs - medical: Not on file  . Transportation needs - non-medical: Not on file  Occupational History  . Occupation: Retired  Tobacco Use  . Smoking status: Never Smoker  . Smokeless tobacco: Never Used  Substance and Sexual Activity  . Alcohol use: Yes    Alcohol/week: 1.8 oz    Types: 3 Cans of beer per week  . Drug use: No  . Sexual activity: Yes  Other Topics Concern  . Not on file  Social History Narrative   Lives at home w/ significant other   Right-handed   Drinks 2 cups caffeine per day    Family History:    Family History  Adopted: Yes  Problem Relation Age of Onset  . Osteoporosis Mother   . Heart disease Mother   . Hypertension Mother   . Hyperlipidemia Mother   . Stroke Mother   . Arthritis Mother   . Diabetes Father   . Heart attack Father   . Cancer Sister        breast  . Colon cancer Neg Hx      ROS:  Please see the history of present illness.  ROS  No history of asthma All other ROS reviewed and negative.     Physical Exam/Data:   Vitals:   12/17/16 1959 12/17/16 2344 12/18/16 0540 12/18/16 0800  BP: (!) 136/45 (!) 151/60 (!) 110/43 (!) 135/48  Pulse: 63 68 72 67  Resp: 14 18 16 18   Temp: 98 F (36.7 C) 97.7 F (36.5 C) 97.8 F (36.6 C) 98.1 F (36.7 C)  TempSrc: Oral Oral Oral Oral  SpO2: 98% 98% 98% 100%  Weight:   164 lb 3.9 oz (74.5  kg)   Height:        Intake/Output Summary (Last 24 hours) at 12/18/2016 0839 Last data filed at 12/18/2016 0400 Gross per 24 hour  Intake 850 ml  Output -  Net 850 ml   Filed Weights   12/16/16 2327 12/17/16 1709 12/18/16 0540  Weight: 165 lb (74.8 kg) 163 lb 2.3 oz (74 kg) 164 lb 3.9 oz (74.5 kg)   Body mass index is 28.19 kg/m.  General:  Well nourished, well developed, in no acute distress HEENT: normal Lymph: no adenopathy Neck: no JVD Endocrine:  No thryomegaly Vascular: RCE scar, bilateral CA bruits Cardiac:  normal S1, S2; RRR; no murmur no rub Lungs:  Expiratory wheezing bilaterally  Abd: soft, nontender, no hepatomegaly  Ext: no edema, distal pulses 2+ Musculoskeletal:  No deformities, BUE and BLE strength normal and equal Skin: warm and dry  Neuro:  CNs 2-12 intact, no focal abnormalities noted Psych:  Normal affect   EKG:  The EKG was personally reviewed and demonstrates:  NSR Telemetry:  Telemetry was personally reviewed and demonstrates:  NSR  Relevant CV Studies: Myoview 03/15/15-  Nuclear stress EF: 58%.  There was no ST segment deviation noted during stress.  The study is normal.  This is a low risk study.  The left ventricular ejection fraction is normal (55-65%).   Normal pharmacologic nuclear study with no evidence for infarct or ischemia.    Laboratory Data:  Chemistry Recent Labs  Lab 12/16/16 2329  NA 136  K 4.6  CL 102  CO2 27  GLUCOSE 148*  BUN 26*  CREATININE 1.05*  CALCIUM 9.6  GFRNONAA 52*  GFRAA >  60  ANIONGAP 7    No results for input(s): PROT, ALBUMIN, AST, ALT, ALKPHOS, BILITOT in the last 168 hours. Hematology Recent Labs  Lab 12/16/16 2329  WBC 13.9*  RBC 4.05  HGB 12.7  HCT 38.1  MCV 94.1  MCH 31.4  MCHC 33.3  RDW 12.7  PLT 305   Cardiac Enzymes Recent Labs  Lab 12/17/16 1530 12/17/16 1849 12/18/16 0217  TROPONINI <0.03 <0.03 <0.03    Recent Labs  Lab 12/16/16 2342 12/17/16 1103    TROPIPOC 0.00 0.00    BNPNo results for input(s): BNP, PROBNP in the last 168 hours.  DDimer  Recent Labs  Lab 12/17/16 1057  DDIMER <0.27    Radiology/Studies:  Dg Chest 2 View  Result Date: 12/17/2016 CLINICAL DATA:  71 year old female with chest pain and shortness of breath. EXAM: CHEST  2 VIEW COMPARISON:  None. FINDINGS: The lungs are clear. There is no pleural effusion or pneumothorax. The cardiac silhouette is within normal limits. There is atherosclerotic calcification of the aortic arch. Osteopenia with degenerative changes of the spine and shoulders. There are atherosclerotic calcification abdominal aorta. No acute osseous pathology. IMPRESSION: No active cardiopulmonary disease. Electronically Signed   By: Elgie Collard M.D.   On: 12/17/2016 00:00    Assessment and Plan:   Chest pain- Pt has known PVD and has AO Ca++ on CXR so she most likely has CAD. It's not clear her symptoms ar anginal though.   PVD- Bilateral CA dise, s/p RCE April 2017, AO  Ca++ on CXR  URI- Pt just finished a steroid dose pack with some improvement though she still has wheezing- new for her  HTN- Controlled- on Losartan and Lopressor prior to admission  HLD- On statin Rx- LDL 88  Anxiety- On medication for this  Chronic back pain-  Plan- MD to see. - This will need to be done after her respiratory process clears and this can probably be done as an OP. Would add PPI.    For questions or updates, please contact [facility] Please consult [email or web address] for contact info under Cardiology/STEMI.   Signed, Corine Shelter, PA-C  12/18/2016 8:39 AM   Pt seen and  Examined  I agree with findngs as noted above by L Kilroy Pt with hx of CV dz   Admitted with near syncope and CP   Has r/o for MI despite prolonged pain  DId have near syncopal spell with standing    On exam, pt in NAD  Lungs with mild wheezing  Cardiac exam RRR  No S3  No murmurs  Ext without edema  Echo is normal  No MVP  noted  I think the pt is OK to go home   Will arrange for outpt f/u next week  Needs to recover from pulmonary process.  Further evaluation will be made at that time   Consider myovue but it will be based on clnical evaluation at that time .    Dietrich Pates

## 2016-12-18 NOTE — Care Management Obs Status (Signed)
MEDICARE OBSERVATION STATUS NOTIFICATION   Patient Details  Name: Heather AlertMary J Osmond MRN: [id number] Date of Birth: [birth year]   Medicare Observation Status Notification Given:  Yes    Verdene LennertGoldean, Alnita Aybar K, RN 12/18/2016, 2:55 PM

## 2016-12-23 ENCOUNTER — Other Ambulatory Visit (INDEPENDENT_AMBULATORY_CARE_PROVIDER_SITE_OTHER): Payer: Self-pay | Admitting: Specialist

## 2016-12-23 ENCOUNTER — Telehealth: Payer: Self-pay | Admitting: Internal Medicine

## 2016-12-23 ENCOUNTER — Telehealth: Payer: Self-pay | Admitting: Cardiovascular Disease

## 2016-12-23 DIAGNOSIS — M48062 Spinal stenosis, lumbar region with neurogenic claudication: Secondary | ICD-10-CM

## 2016-12-23 DIAGNOSIS — M1611 Unilateral primary osteoarthritis, right hip: Secondary | ICD-10-CM

## 2016-12-23 DIAGNOSIS — M5136 Other intervertebral disc degeneration, lumbar region: Secondary | ICD-10-CM

## 2016-12-23 MED ORDER — ACETAMINOPHEN-CODEINE #3 300-30 MG PO TABS: 1.0000 | ORAL_TABLET | Freq: Three times a day (TID) | ORAL | 0 refills | Status: DC | PRN

## 2016-12-23 NOTE — Telephone Encounter (Signed)
Copied from CRM [id number]. Topic: Quick Communication - See Telephone Encounter >> Dec 23, 2016  1:51 PM Landry MellowFoltz, Melissa J wrote: CRM for notification. See Telephone encounter for:   12/23/16.pt was given Protonix for acid reflux while at hospital ([facility]). Pt stayed in hospital for observation,was released.  Pt doesn't think that she needs it, and heart doctor told her to call primary.  Pt declined to make follow up appt, she is worn out from appts.  Cb number is [phone]

## 2016-12-23 NOTE — Telephone Encounter (Signed)
Patient states she has never had a reflux problem and she doesn't want  the Protonix. She doesn't feel she needs it. Patient has an appointment with cardiology 12/10. Encouraged patient to follow up at the office after she has her cardiology appointment to keep her provider informed about any changes.

## 2016-12-23 NOTE — Telephone Encounter (Signed)
RBaity advised of pts comments

## 2016-12-23 NOTE — Telephone Encounter (Signed)
New Message     Pt c/o medication issue:  1. Name of Medication: protonix   2. How are you currently taking this medication (dosage and times per day)? 1 x a day   3. Are you having a reaction (difficulty breathing--STAT)?  no  4. What is your medication issue?  She has some questions about the medication before she starts taking it ,

## 2016-12-23 NOTE — Telephone Encounter (Signed)
Spoke to pt who states she is not experiencing acid reflux at this present time and is not wanting to schedule. She has spoken to cardiology who states it was her decision based on her s/s. Pt states she will schedule f/u prn

## 2016-12-23 NOTE — Telephone Encounter (Signed)
I have not seen her post hospitalization so I am hesitant to speak as to wether she needs to continue it or not. If she would like to schedule a hospital follow up, we can discuss

## 2016-12-23 NOTE — Telephone Encounter (Signed)
[name] is calling to ask about a medication(Protonix 40 mg)  that she was given while she was in the hospital . She is asking for more information on it . Please call

## 2016-12-23 NOTE — Telephone Encounter (Signed)
Spoke with patient who called to ask about protonix which was prescribed during hospitalization 11/20-11/21. She states she has not started the medication and there is no mention in her chart of acid reflux. She states she went to the hospital for almost passing out which was followed by substernal chest pain that radiated to her back. She states all cardiac tests in the hospital were negative. States an outpatient stress test was discussed but not ordered and she does not feel like she needs it. She states she continues to have back pain from waiting in the ED all night. States hx of back pain from mid back all the way down, hx DDD. She denies chest pain, SOB, or syncope. I asked if she is comfortable with keeping appointment with Dr. Elease HashimotoNahser on 12/10 for follow-up and she agrees. She states she called to get someone to explain the reason for protonix. I advised her to call her PCP to discuss if she should start that medication. She verbalized understanding and agreement and thanked me for taking the time to talk to her.

## 2016-12-23 NOTE — Telephone Encounter (Signed)
Patient called to get a refill on Tylenol w/codene. Please call patient back with info on Rx

## 2016-12-24 NOTE — Telephone Encounter (Signed)
Called to [facility] and lmom that rx was called in

## 2016-12-30 ENCOUNTER — Other Ambulatory Visit: Payer: Self-pay | Admitting: Cardiovascular Disease

## 2017-01-02 ENCOUNTER — Ambulatory Visit (INDEPENDENT_AMBULATORY_CARE_PROVIDER_SITE_OTHER): Payer: Medicare Other | Admitting: Specialist

## 2017-01-02 ENCOUNTER — Encounter (INDEPENDENT_AMBULATORY_CARE_PROVIDER_SITE_OTHER): Payer: Self-pay | Admitting: Specialist

## 2017-01-02 ENCOUNTER — Ambulatory Visit (INDEPENDENT_AMBULATORY_CARE_PROVIDER_SITE_OTHER): Payer: Medicare Other

## 2017-01-02 VITALS — BP 131/52 | HR 59 | Ht 65.0 in | Wt 166.0 lb

## 2017-01-02 DIAGNOSIS — M48062 Spinal stenosis, lumbar region with neurogenic claudication: Secondary | ICD-10-CM

## 2017-01-02 DIAGNOSIS — R2689 Other abnormalities of gait and mobility: Secondary | ICD-10-CM

## 2017-01-02 NOTE — Patient Instructions (Addendum)
   Plan: Avoid bending, stooping and avoid lifting weights greater than 10 lbs. Avoid prolong standing and walking. Avoid frequent bending and stooping  No lifting greater than 10 lbs. May use ice or moist heat for pain. Weight loss is of benefit. Handicap license is approved. Keep appointments with cardiology and neurology and primary care. If another episode of syncopy and vertigo again procede to ER via ambulance.  PT for TENS trial and exercises for balance and coordination.

## 2017-01-02 NOTE — Progress Notes (Signed)
Office Visit Note   Patient: Heather [name]           Date of Birth: 10-07-1945           MRN: [id number] Visit Date: 01/02/2017              Requested by: [name], Regina W, NP [address] [name], [place] [id number] PCP: [name], Regina W, NP   Assessment & Plan: Visit Diagnoses:  1. Spinal stenosis of lumbar region with neurogenic claudication   2. Impairment of balance     Plan: Avoid bending, stooping and avoid lifting weights greater than 10 lbs. Avoid prolong standing and walking. Avoid frequent bending and stooping  No lifting greater than 10 lbs. May use ice or moist heat for pain. Weight loss is of benefit. Handicap license is approved. Keep appointments with cardiology and neurology and primary care. If another episode of syncopy and vertigo again procede to ER via ambulance.  PT for TENS trial and exercises for balance and coordination. Follow-Up Instructions: Return in about 4 weeks (around 01/30/2017).   Orders:  Orders Placed This Encounter  Procedures  . XR Lumbar Spine 2-3 Views  . Ambulatory referral to Physical Therapy   No orders of the defined types were placed in this encounter.     Procedures: No procedures performed   Clinical Data: No additional findings.   Subjective: Chief Complaint  Patient presents with  . Right Hip - Follow-up  . Lower Back - Follow-up    71 year old female with lumbar spinal stenosis, had episode of syncopy with vertigo before Thanksgiving and [facility], there she sat for 12 hours prior to being admitted. She was admitted and given a steroid therapy and breathing treatments. Eventually discharged but had increased back pai with bending stooping and leaning increases the pain. Sitting for 12 hours the back pain began. She is currently taking tylenol 3 # and tramadol. Pain is left upper medial buttock, upper sacral worse with standing and walking. She is experiencing left leg numbness with pain left upper  buttock to the left anterior knee.       Review of Systems  Constitutional: Negative.   HENT: Negative.   Eyes: Negative.   Respiratory: Negative.   Cardiovascular: Negative.   Gastrointestinal: Negative.   Endocrine: Negative.   Genitourinary: Negative.   Musculoskeletal: Negative.   Skin: Negative.   Allergic/Immunologic: Negative.   Neurological: Negative.   Hematological: Negative.   Psychiatric/Behavioral: Negative.      Objective: Vital Signs: BP (!) 131/52 (BP Location: Left Arm, Patient Position: Sitting)   Pulse (!) 59   Ht 5\' 5"  (1.651 m)   Wt 166 lb (75.3 kg)   LMP 01/08/2012   BMI 27.62 kg/m   Physical Exam  Constitutional: She is oriented to person, place, and time. She appears well-developed and well-nourished.  HENT:  Head: Normocephalic and atraumatic.  Eyes: EOM are normal. Pupils are equal, round, and reactive to light.  Neck: Normal range of motion. Neck supple.  Pulmonary/Chest: Effort normal and breath sounds normal.  Abdominal: Soft. Bowel sounds are normal.  Neurological: She is alert and oriented to person, place, and time.  Skin: Skin is warm and dry.  Psychiatric: She has a normal mood and affect. Her behavior is normal. Judgment and thought content normal.    Back Exam   Tenderness  The patient is experiencing tenderness in the lumbar.  Range of Motion  Extension: abnormal  Flexion: abnormal  Lateral bend left: abnormal  Rotation left: abnormal   Muscle Strength  Right Quadriceps:  5/5  Left Quadriceps:  5/5  Right Hamstrings:  5/5  Left Hamstrings:  5/5   Tests  Straight leg raise right: negative Straight leg raise left: negative  Reflexes  Patellar: abnormal Achilles:  Hyporeflexic abnormal Biceps: Hyporeflexic Babinski's sign: normal   Other  Toe walk: normal Heel walk: normal Sensation: normal      Specialty Comments:  No specialty comments available.  Imaging: No results found.   PMFS  History: Patient Active Problem List   Diagnosis Date Noted  . Near syncope   . Chest pain 12/17/2016  . Hypertension 12/17/2016  . Generalized weakness 12/17/2016  . Depression 12/17/2016  . Dehydration 12/17/2016  . Carotid disease, bilateral (HCC) 03/03/2015  . Chronic back pain 03/03/2014  . Seasonal allergies 03/03/2014  . Genital herpes 03/03/2014  . Hyperlipidemia 03/03/2013  . Anxiety and depression 12/11/2011  . Essential hypertension 11/19/2006   Past Medical History:  Diagnosis Date  . Anxiety   . Arthritis    "knees, right hip, lumbar back" (12/17/2016)  . Chicken pox   . Chronic lower back pain   . DDD (degenerative disc disease), lumbar   . Depression   . Headache 06/23/2015   "2d after carotid OR & again today" (12/17/2016)  . Heart murmur    MVP  . High cholesterol   . History of blood transfusion 1975   "related to femur  fracture" (12/17/2016)  . HOH (hard of hearing)    left  . Hypertension   . MVP (mitral valve prolapse)    echo 1986-mild  . 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 Shortness of breath dyspnea    WITH EXERTION    . Stroke [facility](HCC) 04/2015   "light stroke; sometimes my thinking is slow since" (12/17/2016)  . Wears glasses     Family History  Adopted: Yes  Problem Relation Age of Onset  . Osteoporosis Mother   . Heart disease Mother   . Hypertension Mother   . Hyperlipidemia Mother   . Stroke Mother   . Arthritis Mother   . Diabetes Father   . Heart attack Father   . Cancer Sister        breast  . Colon cancer Neg Hx     Past Surgical History:  Procedure Laterality Date  . CARPAL TUNNEL RELEASE Right 03/31/2013   Procedure: RIGHT CARPAL TUNNEL RELEASE;  Surgeon: Nicki ReaperGary R Kuzma, MD;  Location: [facility];  Service: Orthopedics;  Laterality: Right;  . ENDARTERECTOMY Right 05/03/2015   Procedure: RIGHT CAROTID ENDARTERECTOMY ;   Surgeon: Fransisco HertzBrian L Chen, MD;  [facility];  Service: Vascular;  Laterality: Right;  . FEMUR FRACTURE SURGERY Right 1975   "hip"  . FRACTURE SURGERY    . NASAL SINUS SURGERY  1975  . PLANTAR FASCIA RELEASE Bilateral 1968  . SHOULDER OPEN ROTATOR CUFF REPAIR Right 1998  . TONSILLECTOMY  1951  . TUBAL LIGATION  1995   Social History   Occupational History  . Occupation: Retired  Tobacco Use  . Smoking status: Never Smoker  . Smokeless tobacco: Never Used  Substance and Sexual Activity  . Alcohol use: Yes    Alcohol/week: 1.8 oz    Types: 3 Cans of beer per week  . Drug use: No  . Sexual activity: Yes

## 2017-01-06 ENCOUNTER — Ambulatory Visit: Payer: Medicare Other | Admitting: Cardiovascular Disease

## 2017-01-09 ENCOUNTER — Other Ambulatory Visit (INDEPENDENT_AMBULATORY_CARE_PROVIDER_SITE_OTHER): Payer: Self-pay | Admitting: Specialist

## 2017-01-09 DIAGNOSIS — M48062 Spinal stenosis, lumbar region with neurogenic claudication: Secondary | ICD-10-CM

## 2017-01-09 DIAGNOSIS — M1611 Unilateral primary osteoarthritis, right hip: Secondary | ICD-10-CM

## 2017-01-09 DIAGNOSIS — M5136 Other intervertebral disc degeneration, lumbar region: Secondary | ICD-10-CM

## 2017-01-09 NOTE — Telephone Encounter (Signed)
Sent to Dr. Nitka ° ° °

## 2017-01-09 NOTE — Telephone Encounter (Signed)
Med refill  Acetaminophen-codeine ( Tylenol #3)

## 2017-01-15 MED ORDER — ACETAMINOPHEN-CODEINE #3 300-30 MG PO TABS: 1.0000 | ORAL_TABLET | Freq: Three times a day (TID) | ORAL | 0 refills | Status: DC | PRN

## 2017-01-15 NOTE — Telephone Encounter (Signed)
[facility] pharmacy and [facility] with Rx.  

## 2017-01-16 ENCOUNTER — Other Ambulatory Visit: Payer: Self-pay | Admitting: Cardiovascular Disease

## 2017-01-24 NOTE — Progress Notes (Deleted)
Cardiology Office Note    Date:  01/24/2017   ID:  Heather [name], Heather [name] 09/22/1945, MRN [id number]  PCP:  Lorre Munroe, NP  Cardiologist:  ***  Electrophysiologist: ***  Chief Complaint: Hospital follow up s/p    /  Months follow up  History of Present Illness:   Heather [name] is a 71 y.o. female ***   Past Medical History:  Diagnosis Date  . Anxiety   . Arthritis    "knees, right hip, lumbar back" (12/17/2016)  . Chicken pox   . Chronic lower back pain   . DDD (degenerative disc disease), lumbar   . Depression   . Headache 06/23/2015   "2d after carotid OR & again today" (12/17/2016)  . Heart murmur    MVP  . High cholesterol   . History of blood transfusion 1975   "related to femur  fracture" (12/17/2016)  . HOH (hard of hearing)    left  . Hypertension   . MVP (mitral valve prolapse)    echo 1986-mild  . 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 Shortness of breath dyspnea    WITH EXERTION    . Stroke [facility]) 04/2015   "light stroke; sometimes my thinking is slow since" (12/17/2016)  . Wears glasses     Past Surgical History:  Procedure Laterality Date  . CARPAL TUNNEL RELEASE Right 03/31/2013   Procedure: RIGHT CARPAL TUNNEL RELEASE;  Surgeon: Nicki Reaper, MD;  Location: [facility];  Service: Orthopedics;  Laterality: Right;  . ENDARTERECTOMY Right 05/03/2015   Procedure: RIGHT CAROTID ENDARTERECTOMY ;  Surgeon: Fransisco Hertz, MD;  Location: [facility] OR;  Service: Vascular;  Laterality: Right;  . FEMUR FRACTURE SURGERY Right 1975   "hip"  . FRACTURE SURGERY    . NASAL SINUS SURGERY  1975  . PLANTAR FASCIA RELEASE Bilateral 1968  . SHOULDER OPEN ROTATOR CUFF REPAIR Right 1998  . TONSILLECTOMY  1951  . TUBAL LIGATION  1995    Current Medications: Prior to Admission medications   Medication Sig Start Date End Date Taking? Authorizing Provider  acetaminophen  (TYLENOL) 500 MG tablet Take 2 tablets (1,000 mg total) by mouth every 6 (six) hours as needed for moderate pain. 11/21/16   Kerrin Champagne, MD  acetaminophen-codeine (TYLENOL #3) 300-30 MG tablet Take 1 tablet by mouth every 8 (eight) hours as needed. for pain 01/15/17   Kerrin Champagne, MD  albuterol (PROVENTIL HFA;VENTOLIN HFA) 108 (90 Base) MCG/ACT inhaler Inhale 2 puffs every 6 (six) hours as needed into the lungs for wheezing or shortness of breath. 12/11/16   Lorre Munroe, NP  Ascorbic Acid (VITAMIN C) 1000 MG tablet Take 1,000 mg by mouth daily.    [provider]  aspirin EC 81 MG tablet Take 1 tablet (81 mg total) by mouth daily. 03/08/15   Nahser, Deloris Ping, MD  atorvastatin (LIPITOR) 40 MG tablet Take 1 tablet (40 mg total) by mouth daily. Please keep upcoming appt for future refills. Thanks 12/31/16   Nahser, Deloris Ping, MD  busPIRone (BUSPAR) 7.5 MG tablet TAKE 1 TABLET (7.5 MG TOTAL) BY MOUTH 2 (TWO) TIMES DAILY. MUST SCHEDULE ANNUAL PHYSICAL 11/05/16   Lorre Munroe, NP  Cholecalciferol (VITAMIN D3) 1000 UNITS CAPS Take 1 capsule by mouth daily.     [provider]  gabapentin (NEURONTIN) 100 MG capsule TAKE 2 CAPSULES BY MOUTH 3 (  THREE) TIMES DAILY. 12/02/16   York SpanielWillis, Charles K, MD  guaiFENesin (MUCINEX) 600 MG 12 hr tablet Take 1 tablet (600 mg total) by mouth 2 (two) times daily. 12/18/16   Regalado, Belkys A, MD  lidocaine (XYLOCAINE) 2 % jelly Apply 1 application topically as needed. Apply to the skin over the knees and hips up to BID 07/03/16   Kerrin ChampagneNitka, James E, MD  loratadine (CLARITIN) 10 MG tablet Take 10 mg by mouth daily.      [provider]  losartan (COZAAR) 100 MG tablet TAKE 1 TABLET (100 MG TOTAL) BY MOUTH DAILY. 01/16/17   Nahser, Deloris PingPhilip J, MD  meloxicam (MOBIC) 15 MG tablet Take 1 tablet (15 mg total) by mouth daily. 02/21/16   Kerrin ChampagneNitka, James E, MD  metoprolol tartrate (LOPRESSOR) 25 MG tablet TAKE 1 TABLET BY MOUTH TWICE A DAY 12/05/16   Nahser, Deloris PingPhilip  J, MD  Multiple Vitamin (MULTIVITAMIN) tablet Take 1 tablet by mouth daily.      [provider]  pantoprazole (PROTONIX) 40 MG tablet Take 1 tablet (40 mg total) by mouth daily at 6 (six) AM. 12/19/16   Regalado, Belkys A, MD  sertraline (ZOLOFT) 50 MG tablet TAKE 2 TABLETS BY MOUTH EVERY DAY 11/14/16   [name], Regina W, NP  valACYclovir (VALTREX) 500 MG tablet TAKE 1 TABLET BY MOUTH DAILY**CAN INCREASE TO 2 TABS FOR 5 DAY IN THE EVENT OF RECURRENCE** 09/17/16   Constant, Peggy, MD    Allergies:   Sulfamethoxazole   Social History   Socioeconomic History  . Marital status: Divorced    Spouse name: Not on file  . Number of children: 1  . Years of education: 3012  . Highest education level: Not on file  Social Needs  . Financial resource strain: Not on file  . Food insecurity - worry: Not on file  . Food insecurity - inability: Not on file  . Transportation needs - medical: Not on file  . Transportation needs - non-medical: Not on file  Occupational History  . Occupation: Retired  Tobacco Use  . Smoking status: Never Smoker  . Smokeless tobacco: Never Used  Substance and Sexual Activity  . Alcohol use: Yes    Alcohol/week: 1.8 oz    Types: 3 Cans of beer per week  . Drug use: No  . Sexual activity: Yes  Other Topics Concern  . Not on file  Social History Narrative   Lives at home w/ significant other   Right-handed   Drinks 2 cups caffeine per day     Family History:  The patient's family history includes Arthritis in her mother; Cancer in her sister; Diabetes in her father; Heart attack in her father; Heart disease in her mother; Hyperlipidemia in her mother; Hypertension in her mother; Osteoporosis in her mother; Stroke in her mother. She was adopted. ***  ROS:   Please see the history of present illness.    ROS All other systems reviewed and are negative.   PHYSICAL EXAM:   VS:  LMP 01/08/2012    GEN: Well nourished, well developed, in no acute  distress HEENT: normal Neck: no JVD, carotid bruits, or masses Cardiac: ***RRR; no murmurs, rubs, or gallops,no edema  Respiratory:  clear to auscultation bilaterally, normal work of breathing GI: soft, nontender, nondistended, + BS MS: no deformity or atrophy Skin: warm and dry, no rash Neuro:  Alert and Oriented x 3, Strength and sensation are intact Psych: euthymic mood, full affect  Wt Readings from  Last 3 Encounters:  01/02/17 166 lb (75.3 kg)  12/18/16 164 lb 3.9 oz (74.5 kg)  12/10/16 164 lb 8 oz (74.6 kg)      Studies/Labs Reviewed:   EKG:  EKG is ordered today.  The ekg ordered today demonstrates ***  Recent Labs: 11/19/2016: ALT 23 12/16/2016: BUN 26; Creatinine, Ser 1.05; Hemoglobin 12.7; Platelets 305; Potassium 4.6; Sodium 136   Lipid Panel    Component Value Date/Time   CHOL 172 11/19/2016 1141   TRIG 121.0 11/19/2016 1141   HDL 59.30 11/19/2016 1141   CHOLHDL 3 11/19/2016 1141   VLDL 24.2 11/19/2016 1141   LDLCALC 88 11/19/2016 1141   LDLDIRECT 132.0 02/21/2015 1331    Additional studies/ records that were reviewed today include:   Echocardiogram:  Cardiac Catheterization:     ASSESSMENT & PLAN:    1. ***    Medication Adjustments/Labs and Tests Ordered: Current medicines are reviewed at length with the patient today.  Concerns regarding medicines are outlined above.  Medication changes, Labs and Tests ordered today are listed in the Patient Instructions below. There are no Patient Instructions on file for this visit.   Lorelei PontSigned, Chaniya Genter, [place]PA  01/24/2017 3:15 PM    [facility] [address], [place], [place]  [address] Phone: [phone]; Fax: [phone]) [phone]

## 2017-01-29 ENCOUNTER — Ambulatory Visit: Payer: Medicare Other | Admitting: Physician Assistant

## 2017-01-30 ENCOUNTER — Ambulatory Visit (INDEPENDENT_AMBULATORY_CARE_PROVIDER_SITE_OTHER): Payer: Self-pay | Admitting: Specialist

## 2017-02-03 ENCOUNTER — Other Ambulatory Visit (INDEPENDENT_AMBULATORY_CARE_PROVIDER_SITE_OTHER): Payer: Self-pay

## 2017-02-03 ENCOUNTER — Other Ambulatory Visit: Payer: Self-pay | Admitting: Internal Medicine

## 2017-02-03 DIAGNOSIS — M48062 Spinal stenosis, lumbar region with neurogenic claudication: Secondary | ICD-10-CM

## 2017-02-03 DIAGNOSIS — M5136 Other intervertebral disc degeneration, lumbar region: Secondary | ICD-10-CM

## 2017-02-03 DIAGNOSIS — M1611 Unilateral primary osteoarthritis, right hip: Secondary | ICD-10-CM

## 2017-02-03 MED ORDER — ACETAMINOPHEN-CODEINE #3 300-30 MG PO TABS: 1.0000 | ORAL_TABLET | Freq: Three times a day (TID) | ORAL | 0 refills | Status: DC | PRN

## 2017-02-03 NOTE — Telephone Encounter (Signed)
Patient would like a Rx refill on Tylenol #3.  Cb# is [phone].  Please advise.  Thank you. 

## 2017-02-03 NOTE — Addendum Note (Signed)
Addended by: Penne LashSHUE WILLS, Neysa BonitoHRISTY N on: 02/03/2017 12:39 PM   Modules accepted: Orders

## 2017-02-04 ENCOUNTER — Encounter: Payer: Self-pay | Admitting: Cardiovascular Disease

## 2017-02-04 ENCOUNTER — Ambulatory Visit (INDEPENDENT_AMBULATORY_CARE_PROVIDER_SITE_OTHER): Payer: Medicare Other | Admitting: Cardiovascular Disease

## 2017-02-04 ENCOUNTER — Encounter (INDEPENDENT_AMBULATORY_CARE_PROVIDER_SITE_OTHER): Payer: Self-pay

## 2017-02-04 VITALS — BP 135/70 | HR 63 | Ht 64.0 in | Wt 162.4 lb

## 2017-02-04 DIAGNOSIS — I739 Peripheral vascular disease, unspecified: Secondary | ICD-10-CM

## 2017-02-04 DIAGNOSIS — I779 Disorder of arteries and arterioles, unspecified: Secondary | ICD-10-CM | POA: Diagnosis not present

## 2017-02-04 DIAGNOSIS — I10 Essential (primary) hypertension: Secondary | ICD-10-CM | POA: Diagnosis not present

## 2017-02-04 NOTE — Patient Instructions (Signed)
Medication Instructions:  Your physician recommends that you continue on your current medications as directed. Please refer to the Current Medication list given to you today.   Labwork: None Ordered   Testing/Procedures: None Ordered   Follow-Up: Your physician wants you to follow-up in: 1 year with Dr. Nahser.  You will receive a reminder letter in the mail two months in advance. If you don't receive a letter, please call our office to schedule the follow-up appointment.   If you need a refill on your cardiac medications before your next appointment, please call your pharmacy.   Thank you for choosing [facility]! Deaken Jurgens, RN [phone]    

## 2017-02-04 NOTE — Telephone Encounter (Signed)
I called rx to [facility], I called and advised the patient that this has been done

## 2017-02-04 NOTE — Progress Notes (Signed)
Cardiology Office Note   Date:  02/04/2017   ID:  Heather, [name] 05-Jan-1946, MRN [id number]  PCP:  Lorre Munroe, NP  Cardiologist:   Kristeen Miss, MD   Chief Complaint  Patient presents with  . Shortness of Breath     Problem List  1. Essential HTN 2 Carotid artery disease 3. Hyperlipidemia     Feb. 8, 2017 Heather LARNER is a 72 y.o. female who presents for pre op evaluation prior to having carotid artery endarterectomy She has a history of mitral valve prolapse and has previously seen Dr. Aleen Campi. She's not seen a cardiologist for years. She has had some tachypalpitations in the past.  She's had some hyperlipidemia. She also has hypertension. She does not take her BP at home but thinks todays elevated BP is more due to white coat HTN  She has had some exertional chest tightness -  Last for several minutes ( or until she stops walking )  Has been going on for several months   Eats lots of processed foods and fast foods.     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  She has a history of hyperlipidemia. We started atorvastatin 40 mg a day. Her last lipid levels look very good.  Dec. 7, 2017:  Heather [name] is doing ok. Having back pain .   Was supposed to have back surgery earlier this year - had urgent carotid surgery instead ( and had a CVA as a complication of that )  Has some dyspnea.   Thinks its allergies.   February 05, 2016: Heather [name] is seen today for follow-up of a recent emergency room visit.  I saw Arthella in 2017 for preoperative carotid surgery.  She presented to the emergency room in November, 2018 with increasing shortness of breath and some chest discomfort. Is better from a respiratory / cardiology standpoint .   Needs to have back surgery.   Waiting on clearance from Dr. Imogene Burn    She has a history of bilateral carotid artery disease.  She is status post right carotid endarterectomy in April, 2017.  Echo in Nov. 2018 showed normal LV function    Past Medical History:  Diagnosis Date  . Anxiety   . Arthritis    "knees, right hip, lumbar back" (12/17/2016)  . Chicken pox   . Chronic lower back pain   . DDD (degenerative disc disease), lumbar   . Depression   . Headache 06/23/2015   "2d after carotid OR & again today" (12/17/2016)  . Heart murmur    MVP  . High cholesterol   . History of blood transfusion 1975   "related to femur  fracture" (12/17/2016)  . HOH (hard of hearing)    left  . Hypertension   . MVP (mitral valve prolapse)    echo 1986-mild  . 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 Shortness of breath dyspnea    WITH EXERTION    . Stroke [facility]) 04/2015   "light stroke; sometimes my thinking is slow since" (12/17/2016)  . Wears glasses     Past Surgical History:  Procedure Laterality Date  . CARPAL TUNNEL RELEASE Right 03/31/2013   Procedure: RIGHT CARPAL  TUNNEL RELEASE;  Surgeon: Nicki ReaperGary R Kuzma, MD;  Location: [facility];  Service: Orthopedics;  Laterality: Right;  . ENDARTERECTOMY Right 05/03/2015   Procedure: RIGHT CAROTID ENDARTERECTOMY ;  Surgeon: Fransisco HertzBrian L Chen, MD;  [facility];  Service: Vascular;  Laterality: Right;  . FEMUR FRACTURE SURGERY Right 1975   "hip"  . FRACTURE SURGERY    . NASAL SINUS SURGERY  1975  . PLANTAR FASCIA RELEASE Bilateral 1968  . SHOULDER OPEN ROTATOR CUFF REPAIR Right 1998  . TONSILLECTOMY  1951  . TUBAL LIGATION  1995     Current Outpatient Medications  Medication Sig Dispense Refill  . acetaminophen (TYLENOL) 500 MG tablet Take 2 tablets (1,000 mg total) by mouth every 6 (six) hours as needed for moderate pain. 50 tablet 6  . acetaminophen-codeine (TYLENOL #3) 300-30 MG tablet Take 1 tablet by  mouth every 8 (eight) hours as needed. for pain 40 tablet 0  . albuterol (PROVENTIL HFA;VENTOLIN HFA) 108 (90 Base) MCG/ACT inhaler Inhale 2 puffs every 6 (six) hours as needed into the lungs for wheezing or shortness of breath. 18 g 0  . Ascorbic Acid (VITAMIN C) 1000 MG tablet Take 1,000 mg by mouth daily.    Marland Kitchen. aspirin EC 81 MG tablet Take 1 tablet (81 mg total) by mouth daily.    Marland Kitchen. atorvastatin (LIPITOR) 40 MG tablet Take 1 tablet (40 mg total) by mouth daily. Please keep upcoming appt for future refills. Thanks 30 tablet 3  . busPIRone (BUSPAR) 7.5 MG tablet Take 1 tablet (7.5 mg total) by mouth 2 (two) times daily. 180 tablet 1  . Cholecalciferol (VITAMIN D3) 1000 UNITS CAPS Take 1 capsule by mouth daily.     Marland Kitchen. gabapentin (NEURONTIN) 100 MG capsule TAKE 2 CAPSULES BY MOUTH 3 (THREE) TIMES DAILY. 540 capsule 0  . guaiFENesin (MUCINEX) 600 MG 12 hr tablet Take 1 tablet (600 mg total) by mouth 2 (two) times daily. 30 tablet 0  . lidocaine (XYLOCAINE) 2 % jelly Apply 1 application topically as needed. Apply to the skin over the knees and hips up to BID 30 mL 6  . loratadine (CLARITIN) 10 MG tablet Take 10 mg by mouth daily.      Marland Kitchen. losartan (COZAAR) 100 MG tablet TAKE 1 TABLET (100 MG TOTAL) BY MOUTH DAILY. 30 tablet 0  . meloxicam (MOBIC) 15 MG tablet Take 1 tablet (15 mg total) by mouth daily. 90 tablet 3  . metoprolol tartrate (LOPRESSOR) 25 MG tablet TAKE 1 TABLET BY MOUTH TWICE A DAY 180 tablet 0  . Multiple Vitamin (MULTIVITAMIN) tablet Take 1 tablet by mouth daily.      . pantoprazole (PROTONIX) 40 MG tablet Take 1 tablet (40 mg total) by mouth daily at 6 (six) AM. 30 tablet 0  . sertraline (ZOLOFT) 50 MG tablet TAKE 2 TABLETS BY MOUTH EVERY DAY 180 tablet 0  . valACYclovir (VALTREX) 500 MG tablet TAKE 1 TABLET BY MOUTH DAILY**CAN INCREASE TO 2 TABS FOR 5 DAY IN THE EVENT OF RECURRENCE** 30 tablet 10   No current facility-administered medications for this visit.     Allergies:    Sulfamethoxazole    Social History:  The patient  reports that  has never smoked. she has never used smokeless tobacco. She reports that she drinks about 1.8 oz of alcohol per week. She reports that she does not use drugs.   Family History:  The patient's family history includes Arthritis in her mother; Cancer in her  sister; Diabetes in her father; Heart attack in her father; Heart disease in her mother; Hyperlipidemia in her mother; Hypertension in her mother; Osteoporosis in her mother; Stroke in her mother. She was adopted.    ROS: Noted in current history.  Otherwise all systems are negative  Physical Exam: Blood pressure 135/70, pulse 63, height 5\' 4"  (1.626 m), weight 162 lb 6.4 oz (73.7 kg), last menstrual period 01/08/2012, SpO2 98 %.  GEN:  Well nourished, well developed in no acute distress HEENT: Normal NECK: No JVD; No carotid bruits, right carotid endarterectomy scar LYMPHATICS: No lymphadenopathy CARDIAC: RR,  S1, S2.   RESPIRATORY:  Clear to auscultation without rales, wheezing or rhonchi  ABDOMEN: Soft, non-tender, non-distended MUSCULOSKELETAL:  No edema; No deformity  SKIN: Warm and dry NEUROLOGIC:  Alert and oriented x 3  EKG:     Recent Labs: 11/19/2016: ALT 23 12/16/2016: BUN 26; Creatinine, Ser 1.05; Hemoglobin 12.7; Platelets 305; Potassium 4.6; Sodium 136    Lipid Panel    Component Value Date/Time   CHOL 172 11/19/2016 1141   TRIG 121.0 11/19/2016 1141   HDL 59.30 11/19/2016 1141   CHOLHDL 3 11/19/2016 1141   VLDL 24.2 11/19/2016 1141   LDLCALC 88 11/19/2016 1141   LDLDIRECT 132.0 02/21/2015 1331      Wt Readings from Last 3 Encounters:  02/04/17 162 lb 6.4 oz (73.7 kg)  01/02/17 166 lb (75.3 kg)  12/18/16 164 lb 3.9 oz (74.5 kg)      Other studies Reviewed: Additional studies/ records that were reviewed today include: . Review of the above records demonstrates:    ASSESSMENT AND PLAN:  1.  Carotid artery disease -she is status post  right carotid endarterectomy.  She had a stress test prior to her carotid surgery. She had an episode of shortness of breath and some chest discomfort back in November, 2018 which seems to be related to bronchitis.  She is feeling better and has not had any any recurrent problems.  2.Essential hypertension -    I advised her to avoid eating any extra salt.  Her blood pressure was fairly normal after we waited 10-15 minutes.  3. Hyperlipidemia -    Managed by her primary MD. Continue current dose of atorvastatin.   Current medicines are reviewed at length with the patient today.  The patient does not have concerns regarding medicines.  The following changes have been made:  no change  Labs/ tests ordered today include:  No orders of the defined types were placed in this encounter.  Disposition:    Follow up in 1 year .   Kristeen Miss, MD  02/04/2017 1:25 PM    [facility] [address] [place], [place], [place]  [address] Phone: [phone]; Fax: [phone]

## 2017-02-06 ENCOUNTER — Other Ambulatory Visit: Payer: Self-pay

## 2017-02-06 ENCOUNTER — Ambulatory Visit: Payer: Medicare Other | Attending: Specialist | Admitting: Physical Therapy

## 2017-02-06 ENCOUNTER — Encounter: Payer: Self-pay | Admitting: Physical Therapy

## 2017-02-06 DIAGNOSIS — M5442 Lumbago with sciatica, left side: Secondary | ICD-10-CM | POA: Diagnosis not present

## 2017-02-06 DIAGNOSIS — M6281 Muscle weakness (generalized): Secondary | ICD-10-CM | POA: Insufficient documentation

## 2017-02-06 DIAGNOSIS — G8929 Other chronic pain: Secondary | ICD-10-CM | POA: Insufficient documentation

## 2017-02-06 DIAGNOSIS — R262 Difficulty in walking, not elsewhere classified: Secondary | ICD-10-CM

## 2017-02-06 NOTE — Therapy (Addendum)
[facility] [address] [place], [place], [address] Phone: [phone]   Fax:  [phone]  Physical Therapy Evaluation  Patient Details  Name: Heather [name] MRN: [id number] Date of Birth: [birth year] Referring Provider: Kerrin Champagne, MD   Encounter Date: 02/06/2017  PT End of Session - 02/06/17 2204    Visit Number  1    Number of Visits  17    Date for PT Re-Evaluation  04/07/17    Authorization Type  [employer] Medicare/Medicaid [place]     PT Start Time  1021    PT Stop Time  1110    PT Time Calculation (min)  49 min    Activity Tolerance  Patient limited by pain    Behavior During Therapy  Anxious       Past Medical History:  Diagnosis Date  . Anxiety   . Arthritis    "knees, right hip, lumbar back" (12/17/2016)  . Chicken pox   . Chronic lower back pain   . DDD (degenerative disc disease), lumbar   . Depression   . Headache 06/23/2015   "2d after carotid OR & again today" (12/17/2016)  . Heart murmur    MVP  . High cholesterol   . History of blood transfusion 1975   "related to femur  fracture" (12/17/2016)  . HOH (hard of hearing)    left  . Hypertension   . MVP (mitral valve prolapse)    echo 1986-mild  . 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 Shortness of breath dyspnea    WITH EXERTION    . Stroke [facility]) 04/2015   "light stroke; sometimes my thinking is slow since" (12/17/2016)  . Wears glasses     Past Surgical History:  Procedure Laterality Date  . CARPAL TUNNEL RELEASE Right 03/31/2013   Procedure: RIGHT CARPAL TUNNEL RELEASE;  Surgeon: Nicki Reaper, MD;  Location: [facility];  Service: Orthopedics;  Laterality: Right;  . ENDARTERECTOMY Right 05/03/2015   Procedure: RIGHT CAROTID ENDARTERECTOMY ;  Surgeon: Fransisco Hertz, MD;  Location: [facility] OR;  Service: Vascular;  Laterality: Right;  . FEMUR  FRACTURE SURGERY Right 1975   "hip"  . FRACTURE SURGERY    . NASAL SINUS SURGERY  1975  . PLANTAR FASCIA RELEASE Bilateral 1968  . SHOULDER OPEN ROTATOR CUFF REPAIR Right 1998  . TONSILLECTOMY  1951  . TUBAL LIGATION  1995    There were no vitals filed for this visit.   Subjective Assessment - 02/06/17 1027    Subjective  Pt reports constant pain in low back, over the past month her back pain has intensified and wakes her up at night with decreased ability to roll over.  This began after a trip to the hospital where she had to sit in a transport w/c x 12 hours.  Pt reports she can't stay in one position for too long or she becomes stiff.  The pain can travel down LLE (lateral) to ankle and at times it can grab/burn.  Has experienced urinary incontinence and has to wear a pad.  Has seen a surgeon who recommends surgery but due to CVA risk pt was referred to PT.  Denies night sweats and denies sudden changes in weight.      Pertinent History  Carotid artery surgery with speech deficits; previous R hip injury after accident in her 20's, took care of her mother  full time who was dependent for transfers causing back pain 3 years ago.      Patient Stated Goals  To be more comfortable in her back     Currently in Pain?  Yes    Pain Score  8     Pain Location  Back    Pain Orientation  Lower;Left    Pain Descriptors / Indicators  Burning;Guarding;Grimacing;Constant    Pain Radiating Towards  lateral LLE    Pain Onset  More than a month ago    Pain Frequency  Constant         [facility] PT Assessment - 02/06/17 1043      Assessment   Medical Diagnosis  Lumbar stenosis with neurogenic claudication    Referring Provider  Kerrin Champagne, MD    Onset Date/Surgical Date  01/02/17      Precautions   Precautions  Other (comment)      Balance Screen   Has the patient fallen in the past 6 months  No    Has the patient had a decrease in activity level because of a fear of falling?   Yes    Is the  patient reluctant to leave their home because of a fear of falling?   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      Strength   Overall Strength  Deficits    Overall Strength Comments  RLE: 4+/5; LLE: 4-/5 due to pain      Palpation   SI assessment   SI neutrally rotated, Leg length WFL    Palpation comment  Tenderness over L quadratus lumborum      Transfers   Five time sit to stand comments   25.91, from arm chair without use of UE             Objective measurements completed on examination: See above findings.              PT Education - 02/07/17 1000    Education provided  Yes    Education Details  clinical findings,  PT POC and goals    Person(s) Educated  Patient    Methods  Explanation    Comprehension  Verbalized understanding       PT Short Term  Goals - 02/07/17 1007      PT SHORT TERM GOAL #1   Title  Pt will participate in further assessment of LE ROM: hip flexor Thomas test and symptoms with exertion: 6 min walk    Baseline  Not assessed to date    Time  4    Period  Weeks    Status  New    Target Date  03/09/17      PT SHORT TERM GOAL #2   Title  Pt will demonstrate independence with initial core, LE strengthening HEP and walking program    Baseline  dependent for exercises    Time  4    Period  Weeks    Status  New    Target Date  03/09/17      PT SHORT TERM GOAL #3   Title  Pt will decrease five time sit to stand to </= 20 seconds due to decreased pain and increased LE strength    Baseline  25.91    Time  4    Period  Weeks    Status  New    Target Date  03/09/17      PT SHORT TERM GOAL #4   Title  Pt will report 20-25% reduction in daily pain and radicular symptoms    Baseline  8-10/10 pain daily    Time  4    Period  Weeks    Status  New    Target Date  03/09/17      PT Short Term Goals - 02/07/17 1007      PT SHORT TERM GOAL #1   Title  Pt will participate in further assessment of LE ROM: hip flexor Thomas test and symptoms with exertion: 6 min walk    Baseline  Not assessed to date    Time  4    Period  Weeks    Status  New    Target Date  03/09/17      PT SHORT TERM GOAL #2   Title  Pt will demonstrate independence with initial core, LE strengthening HEP and walking program    Baseline  dependent for exercises    Time  4    Period  Weeks    Status  New    Target Date  03/09/17      PT SHORT TERM GOAL #3   Title  Pt will decrease five time sit to stand to </= 20 seconds due to decreased pain and increased LE strength    Baseline  25.91    Time  4    Period  Weeks    Status  New    Target Date  03/09/17      PT SHORT TERM GOAL #4   Title  Pt will report 20-25% reduction in daily pain and radicular symptoms    Baseline  8-10/10 pain daily    Time  4    Period  Weeks    Status  New     Target Date  03/09/17         PT Long Term Goals - 02/07/17 1011      PT LONG TERM GOAL #1   Title  Pt will demonstrate independence with final HEP and walking program    Time  8  Period  Weeks    Status  New    Target Date  04/07/17      PT LONG TERM GOAL #2   Title  Pt will report 50% reduction in pain and radicular symptoms by D/C    Baseline  8-10/10 daily    Time  8    Period  Weeks    Status  New    Target Date  04/07/17      PT LONG TERM GOAL #3   Title  Pt will decrease five time sit to stand to </= 15 seconds due to decreased pain and increased strength    Baseline  25.91 at eval    Time  8    Period  Weeks    Status  New    Target Date  04/07/17      PT LONG TERM GOAL #4   Title  Pt will demonstrate improved posture with decreased thoracic kyphosis and more equal WB through bilat pelvis in sitting, neutral rotation in standing    Baseline  increased kyphosis, sits with bias to R, in standing rotates pelvis/body to R    Time  8    Period  Weeks    Status  New    Target Date  04/07/17      PT LONG TERM GOAL #5   Title  Pt will improve 6 minute walk by 100' reporting pain as </= 5/10    Baseline  TBD    Time  8    Period  Weeks    Status  New    Target Date  04/08/17      Additional Long Term Goals   Additional Long Term Goals  Yes      PT LONG TERM GOAL #6   Title  LE ROM goal if needed    Baseline  TBD             Plan - 02/07/17 1001    Clinical Impression Statement  Pt is a 72 year old female referred to Neuro OPPT for evaluation of back pain with neurogenic claudication due to spinal stenosis; pt is currently not a candidate for surgery due to risk for CVA.  Pt's PMH is significant for the following: anxiety, OA, chronic LBP, DDD, depression, headache, heart murmur, high cholesterol, HOH, HTN, mitral valve prolapse, SOB, R carpal tunnel release, R femur fracture, plantar fascia release, R rotator cuff repair, and mild CVA. The following  deficits were noted during pt's exam: lumbar pain that radiates into LE intermittently, LE weakness L >R, increased muscle tension with abnormal movement patterns, abnormal posture, impaired balance and gait.  Pt is at increased falls risk as indicated by five time sit to stand of >15 seconds (25.91). Pt would benefit from skilled PT to address these impairments and functional limitations to maximize functional mobility independence and reduce falls risk.    History and Personal Factors relevant to plan of care:  : anxiety, OA, chronic LBP, DDD, depression, headache, heart murmur, high cholesterol, HOH, HTN, mitral valve prolapse, SOB, R carpal tunnel release, R femur fracture, plantar fascia release, R rotator cuff repair, and mild CVA    Clinical Presentation  Evolving    Clinical Presentation due to:  : anxiety, OA, chronic LBP, DDD, depression, headache, heart murmur, high cholesterol, HOH, HTN, mitral valve prolapse, SOB, R carpal tunnel release, R femur fracture, plantar fascia release, R rotator cuff repair, and mild CVA    Clinical Decision Making  Moderate  Rehab Potential  Fair    Clinical Impairments Affecting Rehab Potential  chronicity of back pain, anxiety/depression, physician has recommended surgery if cleared by cardiology/neurology    PT Frequency  Other (comment) 1-2x/week based on pt schedule    PT Duration  8 weeks    PT Treatment/Interventions  ADLs/Self Care Home Management;Cryotherapy;Electrical Stimulation;Moist Heat;DME Instruction;Gait training;Stair training;Functional mobility training;Therapeutic activities;Therapeutic exercise;Balance training;Neuromuscular re-education;Patient/family education;Manual techniques;Passive range of motion;Dry needling;Taping    PT Next Visit Plan  assess hip flexor tightness, assess symptoms with prolonged exertion (neurogenic claudication?) with 6 min walk.  Discuss use and purpose of DN.  Initiate walking program, supine postural and core  stabilization (TA) exercises with breathing.     PT Home Exercise Plan  core strengthening/postural exercises/breathing/walking program    Recommended Other Services  dry needling lumbar paraspinals, L quadratus lumborum    Consulted and Agree with Plan of Care  Patient       Patient will benefit from skilled therapeutic intervention in order to improve the following deficits and impairments:  Decreased activity tolerance, Decreased balance, Decreased mobility, Decreased range of motion, Decreased strength, Difficulty walking, Postural dysfunction, Pain  Visit Diagnosis: Chronic bilateral low back pain with left-sided sciatica  Muscle weakness (generalized)  Difficulty in walking, not elsewhere classified     Problem List Patient Active Problem List   Diagnosis Date Noted  . Near syncope   . Chest pain 12/17/2016  . Hypertension 12/17/2016  . Generalized weakness 12/17/2016  . Depression 12/17/2016  . Dehydration 12/17/2016  . Carotid disease, bilateral (HCC) 03/03/2015  . Chronic back pain 03/03/2014  . Seasonal allergies 03/03/2014  . Genital herpes 03/03/2014  . Hyperlipidemia 03/03/2013  . Anxiety and depression 12/11/2011  . Essential hypertension 11/19/2006    Dierdre Highman, PT, DPT 02/07/17    4:25 PM    [facility] [address] [place], [place], [address] Phone: [phone]   Fax:  [phone]  Name: Heather [name] MRN: [id number] Date of Birth: 1945/08/09

## 2017-02-09 ENCOUNTER — Other Ambulatory Visit: Payer: Self-pay | Admitting: Internal Medicine

## 2017-02-10 ENCOUNTER — Encounter: Payer: Self-pay | Admitting: Physical Therapy

## 2017-02-10 ENCOUNTER — Ambulatory Visit: Payer: Medicare Other | Attending: Specialist | Admitting: Physical Therapy

## 2017-02-10 DIAGNOSIS — M5442 Lumbago with sciatica, left side: Secondary | ICD-10-CM | POA: Insufficient documentation

## 2017-02-10 DIAGNOSIS — M6281 Muscle weakness (generalized): Secondary | ICD-10-CM | POA: Insufficient documentation

## 2017-02-10 DIAGNOSIS — G8929 Other chronic pain: Secondary | ICD-10-CM | POA: Diagnosis present

## 2017-02-10 DIAGNOSIS — R262 Difficulty in walking, not elsewhere classified: Secondary | ICD-10-CM | POA: Diagnosis present

## 2017-02-10 NOTE — Patient Instructions (Addendum)
Knee to Chest (Flexion)    Pull knee toward chest. Feel stretch in lower back or buttock area. Breathing deeply, Hold __30__ seconds. Repeat with other knee. Repeat __2-3__ times. Do __2-3__ sessions per day.   Lower Trunk Rotation Stretch    Keeping back flat and feet together, rotate knees to left side. Hold __30__ seconds.  Repeat to other side. Repeat _2-3___ times per set. Do _1__ sets per session. Do __2-3__ sessions per day.   Hamstring Step 2    Left foot relaxed, knee straight, other leg bent, foot flat. Raise straight leg further upward to maximal range. Hold _30__ seconds. Relax leg completely down.  Repeat with other leg. Repeat _2-3__ times.  Perform 2-3 sessions per day.    Piriformis Stretch    Lying on back, pull right knee toward opposite shoulder. Hold __30__ seconds.  Repeat with other leg. You can bend your opposite leg and rest the outside of your ankle on your knee if that is more comfortable.  Repeat __2-3__ times. Do __2-3__ sessions per day.   Trigger Point Dry Needling  . What is Trigger Point Dry Needling (DN)? o DN is a physical therapy technique used to treat muscle pain and dysfunction. Specifically, DN helps deactivate muscle trigger points (muscle knots).  o A thin filiform needle is used to penetrate the skin and stimulate the underlying trigger point. The goal is for a local twitch response (LTR) to occur and for the trigger point to relax. No medication of any kind is injected during the procedure.   . What Does Trigger Point Dry Needling Feel Like?  o The procedure feels different for each individual patient. Some patients report that they do not actually feel the needle enter the skin and overall the process is not painful. Very mild bleeding may occur. However, many patients feel a deep cramping in the muscle in which the needle was inserted. This is the local twitch response.   Marland Kitchen. How Will I feel after the treatment? o Soreness is  normal, and the onset of soreness may not occur for a few hours. Typically this soreness does not last longer than two days.  o Bruising is uncommon, however; ice can be used to decrease any possible bruising.  o In rare cases feeling tired or nauseous after the treatment is normal. In addition, your symptoms may get worse before they get better, this period will typically not last longer than 24 hours.   . What Can I do After My Treatment? o Increase your hydration by drinking more water for the next 24 hours. o You may place ice or heat on the areas treated that have become sore, however, do not use heat on inflamed or bruised areas. Heat often brings more relief post needling. o You can continue your regular activities, but vigorous activity is not recommended initially after the treatment for 24 hours. o DN is best combined with other physical therapy such as strengthening, stretching, and other therapies.

## 2017-02-10 NOTE — Therapy (Signed)
[facility] [address] [place], [place], [address] Phone: [phone]   Fax:  [phone]  Physical Therapy Treatment  Patient Details  Name: Heather [name] MRN: [id number] Date of Birth: [birth year] Referring Provider: Jessy Oto, MD   Encounter Date: 02/10/2017  PT End of Session - 02/10/17 1014    Visit Number  2    Number of Visits  17    Date for PT Re-Evaluation  04/07/17    Authorization Type  [employer] Medicare/Medicaid [place]     PT Start Time  0930    PT Stop Time  1012    PT Time Calculation (min)  42 min    Activity Tolerance  Patient tolerated treatment well    Behavior During Therapy  [facility] for tasks assessed/performed       Past Medical History:  Diagnosis Date  . Anxiety   . Arthritis    "knees, right hip, lumbar back" (12/17/2016)  . Chicken pox   . Chronic lower back pain   . DDD (degenerative disc disease), lumbar   . Depression   . Headache 06/23/2015   "2d after carotid OR & again today" (12/17/2016)  . Heart murmur    MVP  . High cholesterol   . History of blood transfusion 1975   "related to femur  fracture" (12/17/2016)  . HOH (hard of hearing)    left  . Hypertension   . MVP (mitral valve prolapse)    echo 1986-mild  . 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 Shortness of breath dyspnea    WITH EXERTION    . Stroke [facility]) 04/2015   "light stroke; sometimes my thinking is slow since" (12/17/2016)  . Wears glasses     Past Surgical History:  Procedure Laterality Date  . CARPAL TUNNEL RELEASE Right 03/31/2013   Procedure: RIGHT CARPAL TUNNEL RELEASE;  Surgeon: Wynonia Sours, MD;  Location: [place];  Service: Orthopedics;  Laterality: Right;  . ENDARTERECTOMY Right 05/03/2015   Procedure: RIGHT CAROTID ENDARTERECTOMY ;  Surgeon: Conrad [place], MD;  Location: [place];  Service: Vascular;   Laterality: Right;  . FEMUR FRACTURE SURGERY Right 1975   "hip"  . FRACTURE SURGERY    . NASAL SINUS SURGERY  1975  . PLANTAR FASCIA RELEASE Bilateral 1968  . SHOULDER OPEN ROTATOR CUFF REPAIR Right 1998  . TONSILLECTOMY  1951  . TUBAL LIGATION  1995    There were no vitals filed for this visit.  Subjective Assessment - 02/10/17 0931    Subjective  reports back painis about the same.     Patient Stated Goals  To be more comfortable in her back     Currently in Pain?  Yes    Pain Score  5     Pain Location  Back    Pain Orientation  Left;Lower    Pain Descriptors / Indicators  Constant;Tightness    Pain Type  Chronic pain    Pain Radiating Towards  lateral LLE    Pain Onset  More than a month ago    Pain Frequency  Constant    Aggravating Factors   sitting too long, walking too much    Pain Relieving Factors  repositioning         [facility] PT Assessment - 02/10/17 0933      6 Minute Walk- Baseline   6 Minute Walk- Baseline  yes    BP (mmHg)  110/66    HR (bpm)  51    02 Sat (%RA)  97 %    Modified Borg Scale for Dyspnea  0- Nothing at all    Perceived Rate of Exertion (Borg)  6-      6 Minute walk- Post Test   6 Minute Walk Post Test  yes    BP (mmHg)  130/70    HR (bpm)  66    02 Sat (%RA)  97 %    Modified Borg Scale for Dyspnea  3- Moderate shortness of breath or breathing difficulty    Perceived Rate of Exertion (Borg)  13- Somewhat hard      6 minute walk test results    Aerobic Endurance Distance Walked  1129    Endurance additional comments  no LE symptoms with 6MWT                  [facility] Adult PT Treatment/Exercise - 02/10/17 1005      Exercises   Exercises  Lumbar      Lumbar Exercises: Stretches   Passive Hamstring Stretch  Right;Left;2 reps;30 seconds    Single Knee to Chest Stretch  Right;Left;2 reps;30 seconds    Lower Trunk Rotation  2 reps;30 seconds    Piriformis Stretch  Right;Left;2 reps;30 seconds             PT  Education - 02/10/17 1014    Education provided  Yes    Education Details  HEP, DN    Person(s) Educated  Patient    Methods  Explanation;Handout;Demonstration    Comprehension  Verbalized understanding;Need further instruction;Returned demonstration       PT Short Term Goals - 02/07/17 1007      PT SHORT TERM GOAL #1   Title  Pt will participate in further assessment of LE ROM: hip flexor Thomas test and symptoms with exertion: 6 min walk    Time  4    Period  Weeks    Status  New    Target Date  03/09/17      PT SHORT TERM GOAL #2   Title  Pt will demonstrate independence with initial core, LE strengthening HEP and walking program    Time  4    Period  Weeks    Status  New    Target Date  03/09/17      PT SHORT TERM GOAL #3   Title  Pt will decrease five time sit to stand to </= 20 seconds due to decreased pain and increased LE strength    Baseline  25.91    Time  4    Period  Weeks    Status  New    Target Date  03/09/17      PT SHORT TERM GOAL #4   Title  Pt will report 20-25% reduction in daily pain and radicular symptoms    Baseline  8-10/10 pain daily    Time  4    Period  Weeks    Status  New    Target Date  03/09/17        PT Long Term Goals - 02/07/17 1011      PT LONG TERM GOAL #1   Title  Pt will demonstrate independence with final HEP and walking program    Time  8    Period  Weeks    Status  New    Target Date  04/07/17  PT LONG TERM GOAL #2   Title  Pt will report 50% reduction in pain and radicular symptoms by D/C    Baseline  8-10/10 daily    Time  8    Period  Weeks    Status  New    Target Date  04/07/17      PT LONG TERM GOAL #3   Title  Pt will decrease five time sit to stand to </= 15 seconds due to decreased pain and increased strength    Baseline  25.91 at eval    Time  8    Period  Weeks    Status  New    Target Date  04/07/17      PT LONG TERM GOAL #4   Title  Pt will demonstrate improved posture with decreased  thoracic kyphosis and more equal WB through bilat pelvis in sitting, neutral rotation in standing    Baseline  increased kyphosis, sits with bias to R, in standing rotates pelvis/body to R    Time  8    Period  Weeks    Status  New    Target Date  04/07/17      PT LONG TERM GOAL #5   Title  Pt will improve 6 minute walk by 100' reporting pain as </= 5/10    Baseline  TBD    Time  8    Period  Weeks    Status  New    Target Date  04/08/17      Additional Long Term Goals   Additional Long Term Goals  Yes      PT LONG TERM GOAL #6   Title  LE ROM goal if needed    Baseline  TBD            Plan - 02/10/17 1014    Clinical Impression Statement  Performed 6MWT today without claudication symptoms noted today, and initiated HEP for flexibility.  Will need core and hip stabilization exercises added to HEP.  No goals met as only 2nd visit.    Clinical Impairments Affecting Rehab Potential  chronicity of back pain, anxiety/depression, physician has recommended surgery if cleared by cardiology/neurology    PT Treatment/Interventions  ADLs/Self Care Home Management;Cryotherapy;Electrical Stimulation;Moist Heat;DME Instruction;Gait training;Stair training;Functional mobility training;Therapeutic activities;Therapeutic exercise;Balance training;Neuromuscular re-education;Patient/family education;Manual techniques;Passive range of motion;Dry needling;Taping    PT Next Visit Plan  assess hip flexor tightness; Initiate walking program, supine postural and core stabilization (TA) exercises with breathing.  review HEP    Consulted and Agree with Plan of Care  Patient       Patient will benefit from skilled therapeutic intervention in order to improve the following deficits and impairments:  Decreased activity tolerance, Decreased balance, Decreased mobility, Decreased range of motion, Decreased strength, Difficulty walking, Postural dysfunction, Pain  Visit Diagnosis: Chronic bilateral low back  pain with left-sided sciatica  Muscle weakness (generalized)  Difficulty in walking, not elsewhere classified     Problem List Patient Active Problem List   Diagnosis Date Noted  . Near syncope   . Chest pain 12/17/2016  . Hypertension 12/17/2016  . Generalized weakness 12/17/2016  . Depression 12/17/2016  . Dehydration 12/17/2016  . Carotid disease, bilateral ([place]) 03/03/2015  . Chronic back pain 03/03/2014  . Seasonal allergies 03/03/2014  . Genital herpes 03/03/2014  . Hyperlipidemia 03/03/2013  . Anxiety and depression 12/11/2011  . Essential hypertension 11/19/2006     Laureen Abrahams, PT, DPT 02/10/17 10:16 AM  [facility] [address] [place], [place], [address] Phone: [phone]   Fax:  [phone]  Name: Heather [name] MRN: [id number] Date of Birth: Aug 26, 1945

## 2017-02-11 ENCOUNTER — Ambulatory Visit (INDEPENDENT_AMBULATORY_CARE_PROVIDER_SITE_OTHER): Payer: Medicare Other

## 2017-02-11 ENCOUNTER — Ambulatory Visit (INDEPENDENT_AMBULATORY_CARE_PROVIDER_SITE_OTHER): Payer: Medicare Other | Admitting: Specialist

## 2017-02-11 ENCOUNTER — Encounter (INDEPENDENT_AMBULATORY_CARE_PROVIDER_SITE_OTHER): Payer: Self-pay | Admitting: Specialist

## 2017-02-11 VITALS — BP 121/57 | HR 55 | Ht 65.0 in | Wt 166.0 lb

## 2017-02-11 DIAGNOSIS — M533 Sacrococcygeal disorders, not elsewhere classified: Secondary | ICD-10-CM | POA: Diagnosis not present

## 2017-02-11 DIAGNOSIS — M25561 Pain in right knee: Secondary | ICD-10-CM

## 2017-02-11 DIAGNOSIS — M48062 Spinal stenosis, lumbar region with neurogenic claudication: Secondary | ICD-10-CM | POA: Diagnosis not present

## 2017-02-11 DIAGNOSIS — G8929 Other chronic pain: Secondary | ICD-10-CM

## 2017-02-11 NOTE — Progress Notes (Signed)
Office Visit Note   Patient: Heather [name]           Date of Birth: 09-28-1945           MRN: [id number] Visit Date: 02/11/2017              Requested by: [name], Regina W, NP [address] [name], [place] [id number] PCP: [name], Regina W, NP   Assessment & Plan: Visit Diagnoses:  1. Chronic pain of right knee   2. Chronic left SI joint pain   3. Spinal stenosis of lumbar region with neurogenic claudication     Plan: in hopes of giving patient some relief I elected to try right knee injection.  After patient consent knee was prepped and intra-articular marc/depo injection was performed.  If pain returns she will contact me and will schedule MRI before next appointment.  Will also send patient to have left SI joint injection with Dr Alvester MorinNewton.  She will pay close attention to how she feels after injection. Follow up with Dr Otelia SergeantNitka in 3 weeks for recheck.  We may need to get lumbar MRI due to her ongoing back issue with claudication.  Last scan was done 2016.    Follow-Up Instructions: Return in about 3 weeks (around 03/04/2017) for Dr Otelia SergeantNitka to check response from injections. .   Orders:  Orders Placed This Encounter  Procedures  . XR KNEE 3 VIEW RIGHT  . Ambulatory referral to Physical Medicine Rehab   No orders of the defined types were placed in this encounter.     Procedures: No procedures performed   Clinical Data: No additional findings.   Subjective: Chief Complaint  Patient presents with  . Lower Back - Follow-up    HPI Patient comes in today with complaints of chronic low back pain and right knee pain. Patient has known history of multilevel lumbar spondylosis with neurogenic claudication symptoms. Most recent lumbar MRI was done 2016. She complains of pain across her low back but probably more so around the left SI joint. Patient also has chronic right knee pain. Has had injections several years ago. Most her knee pain is lateral aspect. Some feeling of  mechanical symptoms. Has had swelling and stiffness. Pain with squatting and twisting and stairs. Review of Systems No current cardiac pulmonary GI GU issues  Objective: Vital Signs: BP (!) 121/57 (BP Location: Left Arm, Patient Position: Sitting)   Pulse (!) 55   Ht 5\' 5"  (1.651 m)   Wt 166 lb (75.3 kg)   LMP 01/08/2012   BMI 27.62 kg/m   Physical Exam  Constitutional: She is oriented to person, place, and time. She appears well-developed. No distress.  HENT:  Head: Normocephalic and atraumatic.  Eyes: EOM are normal. Pupils are equal, round, and reactive to light.  Neck: Normal range of motion.  Abdominal: She exhibits no distension.  Musculoskeletal:  Gait is antalgic. She has lumbar paraspinal tenderness but she is mostly tender over the left SI joint. Mild to moderately tender over the left hip greater trochanter bursa. Pain around the left SI joint with attempted FABER test.  Right knee she does have some swelling. Small effusion. Lateral greater than medial joint line tenderness. Pain with McMurray's testing. Cruciate collateral ligaments are stable.  Neurological: She is alert and oriented to person, place, and time.  Skin: Skin is warm and dry.  Psychiatric: She has a normal mood and affect.    Ortho Exam  Specialty Comments:  No  specialty comments available.  Imaging: No results found.   PMFS History: Patient Active Problem List   Diagnosis Date Noted  . Near syncope   . Chest pain 12/17/2016  . Hypertension 12/17/2016  . Generalized weakness 12/17/2016  . Depression 12/17/2016  . Dehydration 12/17/2016  . Carotid disease, bilateral ([facility]) 03/03/2015  . Chronic back pain 03/03/2014  . Seasonal allergies 03/03/2014  . Genital herpes 03/03/2014  . Hyperlipidemia 03/03/2013  . Anxiety and depression 12/11/2011  . Essential hypertension 11/19/2006   Past Medical History:  Diagnosis Date  . Anxiety   . Arthritis    "knees, right hip, lumbar back"  (12/17/2016)  . Chicken pox   . Chronic lower back pain   . DDD (degenerative disc disease), lumbar   . Depression   . Headache 06/23/2015   "2d after carotid OR & again today" (12/17/2016)  . Heart murmur    MVP  . High cholesterol   . History of blood transfusion 1975   "related to femur  fracture" (12/17/2016)  . HOH (hard of hearing)    left  . Hypertension   . MVP (mitral valve prolapse)    echo 1986-mild  . 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 Shortness of breath dyspnea    WITH EXERTION    . Stroke [facility]) 04/2015   "light stroke; sometimes my thinking is slow since" (12/17/2016)  . Wears glasses     Family History  Adopted: Yes  Problem Relation Age of Onset  . Osteoporosis Mother   . Heart disease Mother   . Hypertension Mother   . Hyperlipidemia Mother   . Stroke Mother   . Arthritis Mother   . Diabetes Father   . Heart attack Father   . Cancer Sister        breast  . Colon cancer Neg Hx     Past Surgical History:  Procedure Laterality Date  . CARPAL TUNNEL RELEASE Right 03/31/2013   Procedure: RIGHT CARPAL TUNNEL RELEASE;  Surgeon: Nicki Reaper, MD;  Location: [facility];  Service: Orthopedics;  Laterality: Right;  . ENDARTERECTOMY Right 05/03/2015   Procedure: RIGHT CAROTID ENDARTERECTOMY ;  Surgeon: Fransisco Hertz, MD;  Location: [facility];  Service: Vascular;  Laterality: Right;  . FEMUR FRACTURE SURGERY Right 1975   "hip"  . FRACTURE SURGERY    . NASAL SINUS SURGERY  1975  . PLANTAR FASCIA RELEASE Bilateral 1968  . SHOULDER OPEN ROTATOR CUFF REPAIR Right 1998  . TONSILLECTOMY  1951  . TUBAL LIGATION  1995   Social History   Occupational History  . Occupation: Retired  Tobacco Use  . Smoking status: Never Smoker  . Smokeless tobacco: Never Used  Substance and Sexual Activity  . Alcohol use: Yes    Alcohol/week: 1.8 oz    Types: 3 Cans of beer per  week  . Drug use: No  . Sexual activity: Yes

## 2017-02-13 ENCOUNTER — Ambulatory Visit (INDEPENDENT_AMBULATORY_CARE_PROVIDER_SITE_OTHER): Payer: Self-pay | Admitting: Specialist

## 2017-02-14 ENCOUNTER — Encounter: Payer: Self-pay | Admitting: Physical Therapy

## 2017-02-14 ENCOUNTER — Ambulatory Visit: Payer: Medicare Other | Admitting: Physical Therapy

## 2017-02-14 DIAGNOSIS — R262 Difficulty in walking, not elsewhere classified: Secondary | ICD-10-CM

## 2017-02-14 DIAGNOSIS — M5442 Lumbago with sciatica, left side: Principal | ICD-10-CM

## 2017-02-14 DIAGNOSIS — G8929 Other chronic pain: Secondary | ICD-10-CM

## 2017-02-14 DIAGNOSIS — M6281 Muscle weakness (generalized): Secondary | ICD-10-CM

## 2017-02-14 NOTE — Patient Instructions (Signed)
Hip Flexor Stretch    Lying on back near edge of bed, bend one leg, foot flat. Hang other leg over edge, relaxed, thigh resting entirely on bed for __1__ minutes. Repeat _2___ times (1 per leg).    Stabilization: Diaphragmatic Breathing    Lie with knees bent, feet flat. Place one hand on stomach, other on chest. Breathe out through pursed lips emptying the lungs of all their air (contract stomach); relax and then breathe in deeply through nose, lifting belly hand without any motion of hand on chest. Repeat __10__ times per set. Do __2__ sets per day  Copyright  VHI. All rights reserved.

## 2017-02-14 NOTE — Therapy (Signed)
[facility] [address] [place], [place], [address] Phone: [phone]   Fax:  [phone]  Physical Therapy Treatment  Patient Details  Name: Heather [name] MRN: [id number] Date of Birth: August 01, 1945 Referring Provider: Kerrin Champagne, MD   Encounter Date: 02/14/2017  PT End of Session - 02/14/17 0941    Visit Number  3    Number of Visits  17    Date for PT Re-Evaluation  04/07/17    Authorization Type  UHC Medicare/Medicaid [place]     PT Start Time  [phone]    PT Stop Time  0933    PT Time Calculation (min)  47 min    Activity Tolerance  Patient tolerated treatment well    Behavior During Therapy  [facility] for tasks assessed/performed       Past Medical History:  Diagnosis Date  . Anxiety   . Arthritis    "knees, right hip, lumbar back" (12/17/2016)  . Chicken pox   . Chronic lower back pain   . DDD (degenerative disc disease), lumbar   . Depression   . Headache 06/23/2015   "2d after carotid OR & again today" (12/17/2016)  . Heart murmur    MVP  . High cholesterol   . History of blood transfusion 1975   "related to femur  fracture" (12/17/2016)  . HOH (hard of hearing)    left  . Hypertension   . MVP (mitral valve prolapse)    echo 1986-mild  . 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 Shortness of breath dyspnea    WITH EXERTION    . Stroke [facility]) 04/2015   "light stroke; sometimes my thinking is slow since" (12/17/2016)  . Wears glasses     Past Surgical History:  Procedure Laterality Date  . CARPAL TUNNEL RELEASE Right 03/31/2013   Procedure: RIGHT CARPAL TUNNEL RELEASE;  Surgeon: Nicki Reaper, MD;  Location: [facility];  Service: Orthopedics;  Laterality: Right;  . ENDARTERECTOMY Right 05/03/2015   Procedure: RIGHT CAROTID ENDARTERECTOMY ;  Surgeon: Fransisco Hertz, MD;  Location: [facility] OR;  Service: Vascular;   Laterality: Right;  . FEMUR FRACTURE SURGERY Right 1975   "hip"  . FRACTURE SURGERY    . NASAL SINUS SURGERY  1975  . PLANTAR FASCIA RELEASE Bilateral 1968  . SHOULDER OPEN ROTATOR CUFF REPAIR Right 1998  . TONSILLECTOMY  1951  . TUBAL LIGATION  1995    There were no vitals filed for this visit.  Subjective Assessment - 02/14/17 0848    Subjective  No issues after 6 minute walk- felt some fatigue.  Had an appointment with Dr. Otelia Sergeant, PA performed injected R knee for pain.  Will be going back for back injection and discussion regarding surgeon.  Stretches are going pretty well but "I'm not going to hurt myself".      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      Patient Stated Goals  To be more comfortable in her back     Currently in Pain?  Yes    Pain Location  Back    Pain Orientation  Lower;Left    Pain Descriptors / Indicators  Sharp;Sore    Pain Type  Chronic pain  [facility] PT Assessment - 02/14/17 0905      Strength   Overall Strength  Deficits    Overall Strength Comments  Double Straight Leg Lowering test for lower abdominal strength: Fair 3/5 (lowered LE 60 deg from mat when pelvis anteriorly tilted)      Flexibility   Soft Tissue Assessment /Muscle Length  yes    Quadriceps  hip flexor: R lacking 30 deg to neutral extension (likely due to old injury); LLE: lacking 10 deg to neutral extension                  [facility] Adult PT Treatment/Exercise - 02/14/17 0856      Self-Care   Self-Care  Other Self-Care Comments    Other Self-Care Comments   Side sleeping with pillow between knees to keep femur, pelvis and low back in better alignment when sleeping      Lumbar Exercises: Stretches   Hip Flexor Stretch  Right;Left;1 rep;30 seconds LE off side of mat      Lumbar Exercises: Supine    Other Supine Lumbar Exercises  Diaphragmatic breathing in hooklying focusing on expanding and contracting the core x 5 reps       Double Straight Leg Lowering  Test  Positioning: The patient is supine, with their hips flexed to 90 degrees and a blood pressure cuff placed under the lumbar spine in the region of L4-5. The cuff is then inflated to . The clinician then raises the patients legs until a noticible posterior rotation of the pelvic occurs and the patient is asked to perform an abdominal bracing procedure to precent more pelvic motion.  Test: The patient attempts to slowly lower their legs to the table maintaining the abdominal contraction. At the point where their is a fluxation in pressure as measured by the cuff or a noticible anterior pelvic rotation, the test is concluded. The amount of hip motion is measured by the clinician.  Normal (5/5): Able to reach 0-15 degrees from the table before tilting of the pelvis. Good (4/5): Able to reach 16-45 degrees from the table for tilting of the pelvis. Fair (3/5): Able to reach 46-75 degrees from the table before tilting of the pelvis. Poor (2/5): Able to reach 75-90 degrees from the table before tilting of the pelvis.  Shawnie Pons Evaluation of specific stabilizing exercises in the treatment of chronic low back pain with radiologic diagnosis of spondylosis or spondylolisthesis. Spine 1997; [id number]. Pixie Casino, Jull G. Evaluation of the relatioship between laboratory and clinical tests of transversus abdominis function. Physiother Res Int 1996;1:30-40.  Abdominal Breathing Lying Down    Place one hand on stomach. Breathe in slowly through nose, letting stomach rise. Breathe out gently through pursed lips, letting stomach fall. Breathe out for at least twice as long as you breathe in. Repeat _10___ times, __2__ times daily.        PT Education - 02/14/17 0940    Education provided  Yes      Education Details  breathing and core activation, hip flexor stretch    Person(s) Educated  Patient    Methods  Explanation;Handout    Comprehension  Verbalized understanding;Need further instruction       PT Short Term Goals - 02/14/17 0945      PT SHORT TERM GOAL #1   Title  Pt will participate in further assessment of LE ROM: hip flexor Thomas test and symptoms with exertion: 6 min walk  Baseline  Hip flexion: lacking 30 deg RLE, 10 deg LLE.  Lower abdominal strength 3/5; 6 minute walk 1129' no radicular pain reported    Time  4    Period  Weeks    Status  Achieved      PT SHORT TERM GOAL #2   Title  Pt will demonstrate independence with initial core, LE strengthening HEP and walking program    Baseline  dependent for exercises    Time  4    Period  Weeks    Status  New    Target Date  03/09/17      PT SHORT TERM GOAL #3   Title  Pt will decrease five time sit to stand to </= 20 seconds due to decreased pain and increased LE strength    Baseline  25.91    Time  4    Period  Weeks    Status  New    Target Date  03/09/17      PT SHORT TERM GOAL #4   Title  Pt will report 20-25% reduction in daily pain and radicular symptoms    Baseline  8-10/10 pain daily    Time  4    Period  Weeks    Status  New    Target Date  03/09/17      PT SHORT TERM GOAL #5   Title  Pt will improve hip flexor ROM by 5 deg bilaterally; will improve lower abdominal strength demonstrating ability to lower bilat LE to within 50 deg of mat    Baseline  lacking 30 deg to neutral RLE; lacking 10 deg to neutral LLE; lower abdominal strength 3/5 (lowered to 60 deg above mat; double leg lowering test)    Time  4    Period  Weeks    Status  New    Target Date  03/09/17        PT Long Term Goals - 02/14/17 0952      PT LONG TERM GOAL #1   Title  Pt will demonstrate independence with final HEP and walking program    Time  8    Period  Weeks    Status  New    Target Date  04/07/17      PT  LONG TERM GOAL #2   Title  Pt will report 50% reduction in pain and radicular symptoms by D/C    Baseline  8-10/10 daily    Time  8    Period  Weeks    Status  New    Target Date  04/07/17      PT LONG TERM GOAL #3   Title  Pt will decrease five time sit to stand to </= 15 seconds due to decreased pain and increased strength    Baseline  25.91 at eval    Time  8    Period  Weeks    Status  New    Target Date  04/07/17      PT LONG TERM GOAL #4   Title  Pt will demonstrate improved posture with decreased thoracic kyphosis and more equal WB through bilat pelvis in sitting, neutral rotation in standing    Baseline  increased kyphosis, sits with bias to R, in standing rotates pelvis/body to R    Time  8    Period  Weeks    Status  New      PT LONG TERM GOAL #5   Title  Pt will improve 6  minute walk by 100' reporting pain as </= 5/10    Baseline  1129; no radicular symptoms    Time  8    Period  Weeks    Status  New    Target Date  04/07/17      PT LONG TERM GOAL #6   Title  Will improve bilat LE hip flexion ROM by 10 for increased LE extension; will improve lower abdominal strength to 4/5    Baseline  Thomas test: lacking 30 deg on RLE, 10 deg on LLE.  3/5 lower abdominal strength    Time  8    Period  Weeks    Status  New    Target Date  04/07/17            Plan - 02/14/17 0943    Clinical Impression Statement  Continued assessment of mm length and joint ROM as well as lower abdominal strength.  Pt demonstrates significant hip flexor tightness R > L and fair lower abdominal strength.  Initiated hip flexor stretching, diaphragmatic breathing and core activation training.  Pt tolerated well with no increase in pain.  Will continue to progress towards LTG.      Clinical Impairments Affecting Rehab Potential  chronicity of back pain, anxiety/depression, physician has recommended surgery if cleared by cardiology/neurology    PT Treatment/Interventions  ADLs/Self Care Home  Management;Cryotherapy;Electrical Stimulation;Moist Heat;DME Instruction;Gait training;Stair training;Functional mobility training;Therapeutic activities;Therapeutic exercise;Balance training;Neuromuscular re-education;Patient/family education;Manual techniques;Passive range of motion;Dry needling;Taping    PT Next Visit Plan  Initiate walking program, progress supine postural and core stabilization (TA) exercises with breathing.  review HEP    PT Home Exercise Plan  core strengthening/postural exercises/breathing/walking program    Consulted and Agree with Plan of Care  Patient       Patient will benefit from skilled therapeutic intervention in order to improve the following deficits and impairments:  Decreased activity tolerance, Decreased balance, Decreased mobility, Decreased range of motion, Decreased strength, Difficulty walking, Postural dysfunction, Pain  Visit Diagnosis: Chronic bilateral low back pain with left-sided sciatica  Muscle weakness (generalized)  Difficulty in walking, not elsewhere classified     Problem List Patient Active Problem List   Diagnosis Date Noted  . Near syncope   . Chest pain 12/17/2016  . Hypertension 12/17/2016  . Generalized weakness 12/17/2016  . Depression 12/17/2016  . Dehydration 12/17/2016  . Carotid disease, bilateral (HCC) 03/03/2015  . Chronic back pain 03/03/2014  . Seasonal allergies 03/03/2014  . Genital herpes 03/03/2014  . Hyperlipidemia 03/03/2013  . Anxiety and depression 12/11/2011  . Essential hypertension 11/19/2006    Dierdre Highman, PT, DPT 02/14/17    9:54 AM    [facility] [address] [place], [place], [address] Phone: [phone]   Fax:  [phone]  Name: Heather [name] MRN: [id number] Date of Birth: November 10, 1945

## 2017-02-17 ENCOUNTER — Ambulatory Visit: Payer: Medicare Other | Admitting: Physical Therapy

## 2017-02-17 ENCOUNTER — Encounter: Payer: Self-pay | Admitting: Physical Therapy

## 2017-02-17 DIAGNOSIS — M5442 Lumbago with sciatica, left side: Principal | ICD-10-CM

## 2017-02-17 DIAGNOSIS — M6281 Muscle weakness (generalized): Secondary | ICD-10-CM

## 2017-02-17 DIAGNOSIS — G8929 Other chronic pain: Secondary | ICD-10-CM

## 2017-02-17 DIAGNOSIS — R262 Difficulty in walking, not elsewhere classified: Secondary | ICD-10-CM

## 2017-02-17 NOTE — Therapy (Signed)
[facility] [address] [place], [place], [address] Phone: [phone]   Fax:  [phone]  Physical Therapy Treatment  Patient Details  Name: Heather [name] MRN: [id number] Date of Birth: 01-26-1946 Referring Provider: Kerrin Champagne, MD   Encounter Date: 02/17/2017  PT End of Session - 02/17/17 1007    Visit Number  4    Number of Visits  17    Date for PT Re-Evaluation  04/07/17    Authorization Type  UHC Medicare/Medicaid [place]     PT Start Time  [phone]    PT Stop Time  1010    PT Time Calculation (min)  39 min    Activity Tolerance  Patient tolerated treatment well    Behavior During Therapy  [facility] for tasks assessed/performed       Past Medical History:  Diagnosis Date  . Anxiety   . Arthritis    "knees, right hip, lumbar back" (12/17/2016)  . Chicken pox   . Chronic lower back pain   . DDD (degenerative disc disease), lumbar   . Depression   . Headache 06/23/2015   "2d after carotid OR & again today" (12/17/2016)  . Heart murmur    MVP  . High cholesterol   . History of blood transfusion 1975   "related to femur  fracture" (12/17/2016)  . HOH (hard of hearing)    left  . Hypertension   . MVP (mitral valve prolapse)    echo 1986-mild  . 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 Shortness of breath dyspnea    WITH EXERTION    . Stroke [facility]) 04/2015   "light stroke; sometimes my thinking is slow since" (12/17/2016)  . Wears glasses     Past Surgical History:  Procedure Laterality Date  . CARPAL TUNNEL RELEASE Right 03/31/2013   Procedure: RIGHT CARPAL TUNNEL RELEASE;  Surgeon: Nicki Reaper, MD;  Location: [facility];  Service: Orthopedics;  Laterality: Right;  . ENDARTERECTOMY Right 05/03/2015   Procedure: RIGHT CAROTID ENDARTERECTOMY ;  Surgeon: Fransisco Hertz, MD;  Location: [facility] OR;  Service: Vascular;   Laterality: Right;  . FEMUR FRACTURE SURGERY Right 1975   "hip"  . FRACTURE SURGERY    . NASAL SINUS SURGERY  1975  . PLANTAR FASCIA RELEASE Bilateral 1968  . SHOULDER OPEN ROTATOR CUFF REPAIR Right 1998  . TONSILLECTOMY  1951  . TUBAL LIGATION  1995    There were no vitals filed for this visit.  Subjective Assessment - 02/17/17 0930    Subjective  did exercises "once" over the weekend.  feels better today; still gets tired easily.  states she's having cramps in her feet. sees Dr. Alvester Morin on 1/31 for lumbar injection.    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      Patient Stated Goals  To be more comfortable in her back                       [facility] Adult PT Treatment/Exercise - 02/17/17 0939      Ambulation/Gait   Gait Comments  amb for endurance: x 6 min with 4 min rest break      Self-Care   Self-Care  Other Self-Care Comments    Other Self-Care Comments   educated on walking program and how to initiate; provided community fitness options including Silver Sneakers      Lumbar Exercises: Stretches   Hip Flexor Stretch  Right;Left;2 reps;30 seconds      Lumbar Exercises: Supine   Ab Set  10 reps;5 seconds    Other Supine Lumbar Exercises  Diaphragmatic breathing in hooklying focusing on expanding and contracting the core x 5 reps             PT Education - 02/17/17 1007    Education provided  Yes    Education Details  silver sneakers; walking program    Person(s) Educated  Patient    Methods  Explanation;Handout    Comprehension  Verbalized understanding;Returned demonstration       PT Short Term Goals - 02/14/17 0945      PT SHORT TERM GOAL #1   Title  Pt will participate in further assessment of LE ROM: hip flexor Thomas test and symptoms with exertion: 6 min walk    Baseline   Hip flexion: lacking 30 deg RLE, 10 deg LLE.  Lower abdominal strength 3/5; 6 minute walk 1129' no radicular pain reported    Time  4    Period  Weeks    Status  Achieved      PT SHORT TERM GOAL #2   Title  Pt will demonstrate independence with initial core, LE strengthening HEP and walking program    Baseline  dependent for exercises    Time  4    Period  Weeks    Status  New    Target Date  03/09/17      PT SHORT TERM GOAL #3   Title  Pt will decrease five time sit to stand to </= 20 seconds due to decreased pain and increased LE strength    Baseline  25.91    Time  4    Period  Weeks    Status  New    Target Date  03/09/17      PT SHORT TERM GOAL #4   Title  Pt will report 20-25% reduction in daily pain and radicular symptoms    Baseline  8-10/10 pain daily    Time  4    Period  Weeks    Status  New    Target Date  03/09/17      PT SHORT TERM GOAL #5   Title  Pt will improve hip flexor ROM by 5 deg bilaterally; will improve lower abdominal strength demonstrating ability to lower bilat LE to within 50 deg of mat    Baseline  lacking 30 deg to neutral RLE; lacking 10 deg to neutral LLE; lower abdominal strength 3/5 (lowered to 60 deg above mat; double leg lowering test)    Time  4    Period  Weeks    Status  New    Target Date  03/09/17        PT Long Term Goals - 02/14/17 0952      PT LONG TERM GOAL #1   Title  Pt will demonstrate independence with final HEP and walking program    Time  8    Period  Weeks    Status  New    Target Date  04/07/17      PT LONG TERM GOAL #2   Title  Pt will report 50% reduction in pain and radicular symptoms by D/C  Baseline  8-10/10 daily    Time  8    Period  Weeks    Status  New    Target Date  04/07/17      PT LONG TERM GOAL #3   Title  Pt will decrease five time sit to stand to </= 15 seconds due to decreased pain and increased strength    Baseline  25.91 at eval    Time  8    Period  Weeks    Status  New    Target  Date  04/07/17      PT LONG TERM GOAL #4   Title  Pt will demonstrate improved posture with decreased thoracic kyphosis and more equal WB through bilat pelvis in sitting, neutral rotation in standing    Baseline  increased kyphosis, sits with bias to R, in standing rotates pelvis/body to R    Time  8    Period  Weeks    Status  New      PT LONG TERM GOAL #5   Title  Pt will improve 6 minute walk by 100' reporting pain as </= 5/10    Baseline  1129; no radicular symptoms    Time  8    Period  Weeks    Status  New    Target Date  04/07/17      PT LONG TERM GOAL #6   Title  Will improve bilat LE hip flexion ROM by 10 for increased LE extension; will improve lower abdominal strength to 4/5    Baseline  Thomas test: lacking 30 deg on RLE, 10 deg on LLE.  3/5 lower abdominal strength    Time  8    Period  Weeks    Status  New    Target Date  04/07/17            Plan - 02/17/17 1007    Clinical Impression Statement  Pt tolerated exercises well today and initiated walking program and core stabilization exercises today.  No new exercises provided as pt has demonstrated minimal compliance at this time and want to ensure compliance with current HEP before adding more exercises.  Slowly progressing towards goals.     PT Treatment/Interventions  ADLs/Self Care Home Management;Cryotherapy;Electrical Stimulation;Moist Heat;DME Instruction;Gait training;Stair training;Functional mobility training;Therapeutic activities;Therapeutic exercise;Balance training;Neuromuscular re-education;Patient/family education;Manual techniques;Passive range of motion;Dry needling;Taping    PT Next Visit Plan  progress supine postural and core stabilization (TA) exercises with breathing    Consulted and Agree with Plan of Care  Patient       Patient will benefit from skilled therapeutic intervention in order to improve the following deficits and impairments:  Decreased activity tolerance, Decreased balance,  Decreased mobility, Decreased range of motion, Decreased strength, Difficulty walking, Postural dysfunction, Pain  Visit Diagnosis: Chronic bilateral low back pain with left-sided sciatica  Muscle weakness (generalized)  Difficulty in walking, not elsewhere classified     Problem List Patient Active Problem List   Diagnosis Date Noted  . Near syncope   . Chest pain 12/17/2016  . Hypertension 12/17/2016  . Generalized weakness 12/17/2016  . Depression 12/17/2016  . Dehydration 12/17/2016  . Carotid disease, bilateral (HCC) 03/03/2015  . Chronic back pain 03/03/2014  . Seasonal allergies 03/03/2014  . Genital herpes 03/03/2014  . Hyperlipidemia 03/03/2013  . Anxiety and depression 12/11/2011  . Essential hypertension 11/19/2006      Clarita Crane, PT, DPT 02/17/17 10:10 AM    [facility]  [address] [place], [place], [address] Phone: [phone]   Fax:  [phone]  Name: Heather [name] MRN: [id number] Date of Birth: 06-26-1945

## 2017-02-17 NOTE — Patient Instructions (Signed)
WALKING  Walking is a great form of exercise to increase your strength, endurance and overall fitness.  A walking program can help you start slowly and gradually build endurance as you go.  Everyone's ability is different, so each person's starting point will be different.  You do not have to follow them exactly.  The are just samples. You should simply find out what's right for you and stick to that program.   In the beginning, you'll start off walking 2-3 times a day for short distances.  As you get stronger, you'll be walking further at just 1-2 times per day.  A. You Can Walk For A Certain Length Of Time Each Day    Walk 5 minutes 3 times per day.  Increase 2 minutes every 2 days (3 times per day).  Work up to 25-30 minutes (1-2 times per day).   Example:   Day 1-2 5-6 minutes 3 times per day   Day 7-8 12 minutes 2-3 times per day   Day 13-14 25 minutes 1-2 times per day

## 2017-02-18 NOTE — Progress Notes (Signed)
Established Carotid Patient   History of Present Illness   Heather [name] is a 72 y.o. ([birth year]) female who presents with chief complaint: back pain.  Prior procedure was a R CEA (05/03/15) complicated by resolved hypoglossal and facial nerve palsy.  Swallow is at baseline at this point.  Hyperesthesia of incision is resolved.  Facial asx is resolved.  Current sx are: back pain.  Pt is getting evaluated for possible spinal procedure.  Also patient has requiring R knee injection recently for arthritic sx..  Previous carotid studies demonstrated: RICA >80% stenosis, LICA 50% stenosis.  Patient has no history of TIA or stroke symptom.  The patient has never had amaurosis fugax or monocular blindness.  The patient has never had facial drooping or hemiplegia.  The patient has never had receptive or expressive aphasia.   The patient's PMH, PSH, SH, and FamHx are unchanged from 08/16/16.   Current Outpatient Medications  Medication Sig Dispense Refill  . acetaminophen (TYLENOL) 500 MG tablet Take 2 tablets (1,000 mg total) by mouth every 6 (six) hours as needed for moderate pain. 50 tablet 6  . acetaminophen-codeine (TYLENOL #3) 300-30 MG tablet Take 1 tablet by mouth every 8 (eight) hours as needed. for pain 40 tablet 0  . albuterol (PROVENTIL HFA;VENTOLIN HFA) 108 (90 Base) MCG/ACT inhaler Inhale 2 puffs every 6 (six) hours as needed into the lungs for wheezing or shortness of breath. 18 g 0  . Ascorbic Acid (VITAMIN C) 1000 MG tablet Take 1,000 mg by mouth daily.    Marland Kitchen aspirin EC 81 MG tablet Take 1 tablet (81 mg total) by mouth daily.    Marland Kitchen atorvastatin (LIPITOR) 40 MG tablet Take 1 tablet (40 mg total) by mouth daily. Please keep upcoming appt for future refills. Thanks 30 tablet 3  . busPIRone (BUSPAR) 7.5 MG tablet Take 1 tablet (7.5 mg total) by mouth 2 (two) times daily. 180 tablet 1  . Cholecalciferol (VITAMIN D3) 1000 UNITS CAPS Take 1 capsule by mouth daily.     Marland Kitchen gabapentin  (NEURONTIN) 100 MG capsule TAKE 2 CAPSULES BY MOUTH 3 (THREE) TIMES DAILY. 540 capsule 0  . guaiFENesin (MUCINEX) 600 MG 12 hr tablet Take 1 tablet (600 mg total) by mouth 2 (two) times daily. 30 tablet 0  . lidocaine (XYLOCAINE) 2 % jelly Apply 1 application topically as needed. Apply to the skin over the knees and hips up to BID 30 mL 6  . loratadine (CLARITIN) 10 MG tablet Take 10 mg by mouth daily.      Marland Kitchen losartan (COZAAR) 100 MG tablet TAKE 1 TABLET (100 MG TOTAL) BY MOUTH DAILY. 30 tablet 0  . meloxicam (MOBIC) 15 MG tablet Take 1 tablet (15 mg total) by mouth daily. 90 tablet 3  . metoprolol tartrate (LOPRESSOR) 25 MG tablet TAKE 1 TABLET BY MOUTH TWICE A DAY 180 tablet 0  . Multiple Vitamin (MULTIVITAMIN) tablet Take 1 tablet by mouth daily.      . pantoprazole (PROTONIX) 40 MG tablet Take 1 tablet (40 mg total) by mouth daily at 6 (six) AM. 30 tablet 0  . sertraline (ZOLOFT) 50 MG tablet TAKE 2 TABLETS BY MOUTH EVERY DAY 180 tablet 1  . valACYclovir (VALTREX) 500 MG tablet TAKE 1 TABLET BY MOUTH DAILY**CAN INCREASE TO 2 TABS FOR 5 DAY IN THE EVENT OF RECURRENCE** 30 tablet 10   No current facility-administered medications for this visit.     On ROS today: no further neuro sx,  back pain   Physical Examination   Vitals:   02/21/17 1000 02/21/17 1004  BP: (!) 138/59 135/62  Pulse: (!) 53   Resp: 16   Temp: (!) 97.1 F (36.2 C)   TempSrc: Oral   SpO2: 97%   Weight: 162 lb (73.5 kg)   Height: 5\' 5"  (1.651 m)    Body mass index is 26.96 kg/m.  General Alert, O x 3, WD, NAD  Neck Supple, mid-line trachea,    Pulmonary Sym exp, good B air movt, CTA B  Cardiac RRR, Nl S1, S2, no Murmurs, No rubs, No S3,S4  Vascular Vessel Right Left  Radial Palpable Palpable  Brachial Palpable Palpable  Carotid Palpable, No Bruit Palpable, No Bruit  Aorta Not palpable N/A  Femoral Palpable Palpable  Popliteal Not palpable Not palpable  PT Palpable Palpable  DP Palpable Palpable      Gastro- intestinal soft, non-distended, non-tender to palpation, No guarding or rebound, no HSM, no masses, no CVAT B, No palpable prominent aortic pulse,    Musculo- skeletal M/S 5/5 throughout  , Extremities without ischemic changes    Neurologic Cranial nerves 2-12 intact , Pain and light touch intact in extremities , Motor exam as listed above    Non-Invasive Vascular Imaging   B Carotid Duplex (02/21/2017):   R ICA stenosis:  1-39%, patent CEA  R VA: patent and antegrade  L ICA stenosis:  1-39%  L VA: patent and antegrade   Medical Decision Making   Heather [name] is a 72 y.o. female who presents with: s/p R CEA for asx ICA stenosis >80%, asx L ICA stenosis 50%   Based on the patient's vascular studies and examination, I have offered the patient: annual B carotid duplex.  I discussed in depth with the patient the nature of atherosclerosis, and emphasized the importance of maximal medical management including strict control of blood pressure, blood glucose, and lipid levels, antiplatelet agents, obtaining regular exercise, and cessation of smoking.    The patient is aware that without maximal medical management the underlying atherosclerotic disease process will progress, limiting the benefit of any interventions. The patient is currently on a statin: Lipitor.  The patient is currently on an anti-platelet: ASA.  Thank you for allowing us to participate in this patient's care.   Leonides SakeBrian Nikholas Geffre, MD, FACS [facility] Office: [phone] Pager: [phone]

## 2017-02-19 ENCOUNTER — Encounter: Payer: Self-pay | Admitting: Physical Therapy

## 2017-02-19 ENCOUNTER — Ambulatory Visit: Payer: Medicare Other | Admitting: Physical Therapy

## 2017-02-19 DIAGNOSIS — M5442 Lumbago with sciatica, left side: Principal | ICD-10-CM

## 2017-02-19 DIAGNOSIS — M6281 Muscle weakness (generalized): Secondary | ICD-10-CM

## 2017-02-19 DIAGNOSIS — R262 Difficulty in walking, not elsewhere classified: Secondary | ICD-10-CM

## 2017-02-19 DIAGNOSIS — G8929 Other chronic pain: Secondary | ICD-10-CM

## 2017-02-19 NOTE — Therapy (Signed)
[facility] [address] [place], [place], [address] Phone: [phone]   Fax:  [phone]  Physical Therapy Treatment  Patient Details  Name: Heather [name] MRN: [id number] Date of Birth: [birth year] Referring Provider: Kerrin Champagne, MD   Encounter Date: 02/19/2017  PT End of Session - 02/19/17 0922    Visit Number  5    Number of Visits  17    Date for PT Re-Evaluation  04/07/17    Authorization Type  UHC Medicare/Medicaid [place]     PT Start Time  [phone]    PT Stop Time  0925    PT Time Calculation (min)  39 min    Activity Tolerance  Patient tolerated treatment well    Behavior During Therapy  [facility] for tasks assessed/performed       Past Medical History:  Diagnosis Date  . Anxiety   . Arthritis    "knees, right hip, lumbar back" (12/17/2016)  . Chicken pox   . Chronic lower back pain   . DDD (degenerative disc disease), lumbar   . Depression   . Headache 06/23/2015   "2d after carotid OR & again today" (12/17/2016)  . Heart murmur    MVP  . High cholesterol   . History of blood transfusion 1975   "related to femur  fracture" (12/17/2016)  . HOH (hard of hearing)    left  . Hypertension   . MVP (mitral valve prolapse)    echo 1986-mild  . 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 Shortness of breath dyspnea    WITH EXERTION    . Stroke [facility]) 04/2015   "light stroke; sometimes my thinking is slow since" (12/17/2016)  . Wears glasses     Past Surgical History:  Procedure Laterality Date  . CARPAL TUNNEL RELEASE Right 03/31/2013   Procedure: RIGHT CARPAL TUNNEL RELEASE;  Surgeon: Nicki Reaper, MD;  Location: [facility];  Service: Orthopedics;  Laterality: Right;  . ENDARTERECTOMY Right 05/03/2015   Procedure: RIGHT CAROTID ENDARTERECTOMY ;  Surgeon: Fransisco Hertz, MD;  Location: [facility] OR;  Service: Vascular;   Laterality: Right;  . FEMUR FRACTURE SURGERY Right 1975   "hip"  . FRACTURE SURGERY    . NASAL SINUS SURGERY  1975  . PLANTAR FASCIA RELEASE Bilateral 1968  . SHOULDER OPEN ROTATOR CUFF REPAIR Right 1998  . TONSILLECTOMY  1951  . TUBAL LIGATION  1995    There were no vitals filed for this visit.  Subjective Assessment - 02/19/17 0851    Subjective  doing well today; still gets fatigued easily.    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      Patient Stated Goals  To be more comfortable in her back     Currently in Pain?  No/denies                      [facility] Adult PT Treatment/Exercise - 02/19/17 0851      Exercises   Exercises  Knee/Hip      Lumbar Exercises: Aerobic   Nustep  L5 x 8 min      Lumbar Exercises: Supine   Ab Set  10 reps;5 seconds    Clam  10 reps    Heel Slides  10 reps    Bent Knee Raise  10 reps    Bridge  10 reps;5 seconds    Other Supine Lumbar Exercises  hooklying single limb clam with red theraband x 10 reps bil      Knee/Hip Exercises: Standing   Heel Raises  Both;20 reps    Hip Abduction  Both;10 reps;Knee straight    Abduction Limitations  core engaged    Hip Extension  Both;10 reps;Knee straight    Extension Limitations  core engaged               PT Short Term Goals - 02/14/17 0945      PT SHORT TERM GOAL #1   Title  Pt will participate in further assessment of LE ROM: hip flexor Thomas test and symptoms with exertion: 6 min walk    Baseline  Hip flexion: lacking 30 deg RLE, 10 deg LLE.  Lower abdominal strength 3/5; 6 minute walk 1129' no radicular pain reported    Time  4    Period  Weeks    Status  Achieved      PT SHORT TERM GOAL #2   Title  Pt will demonstrate independence with initial core, LE strengthening HEP and walking program     Baseline  dependent for exercises    Time  4    Period  Weeks    Status  New    Target Date  03/09/17      PT SHORT TERM GOAL #3   Title  Pt will decrease five time sit to stand to </= 20 seconds due to decreased pain and increased LE strength    Baseline  25.91    Time  4    Period  Weeks    Status  New    Target Date  03/09/17      PT SHORT TERM GOAL #4   Title  Pt will report 20-25% reduction in daily pain and radicular symptoms    Baseline  8-10/10 pain daily    Time  4    Period  Weeks    Status  New    Target Date  03/09/17      PT SHORT TERM GOAL #5   Title  Pt will improve hip flexor ROM by 5 deg bilaterally; will improve lower abdominal strength demonstrating ability to lower bilat LE to within 50 deg of mat    Baseline  lacking 30 deg to neutral RLE; lacking 10 deg to neutral LLE; lower abdominal strength 3/5 (lowered to 60 deg above mat; double leg lowering test)    Time  4    Period  Weeks    Status  New    Target Date  03/09/17        PT Long Term Goals - 02/14/17 0952      PT LONG TERM GOAL #1   Title  Pt will demonstrate independence with final HEP and walking program    Time  8    Period  Weeks    Status  New    Target Date  04/07/17      PT LONG TERM GOAL #2   Title  Pt will report 50% reduction in pain and radicular symptoms by D/C    Baseline  8-10/10 daily    Time  8    Period  Weeks    Status  New    Target Date  04/07/17  PT LONG TERM GOAL #3   Title  Pt will decrease five time sit to stand to </= 15 seconds due to decreased pain and increased strength    Baseline  25.91 at eval    Time  8    Period  Weeks    Status  New    Target Date  04/07/17      PT LONG TERM GOAL #4   Title  Pt will demonstrate improved posture with decreased thoracic kyphosis and more equal WB through bilat pelvis in sitting, neutral rotation in standing    Baseline  increased kyphosis, sits with bias to R, in standing rotates pelvis/body to R    Time  8     Period  Weeks    Status  New      PT LONG TERM GOAL #5   Title  Pt will improve 6 minute walk by 100' reporting pain as </= 5/10    Baseline  1129; no radicular symptoms    Time  8    Period  Weeks    Status  New    Target Date  04/07/17      PT LONG TERM GOAL #6   Title  Will improve bilat LE hip flexion ROM by 10 for increased LE extension; will improve lower abdominal strength to 4/5    Baseline  Thomas test: lacking 30 deg on RLE, 10 deg on LLE.  3/5 lower abdominal strength    Time  8    Period  Weeks    Status  New    Target Date  04/07/17            Plan - 02/19/17 [id number]    Clinical Impression Statement  Pt tolerated core and hip stability exercises well today without increase in pain.  Progressing well and will continue to benefit from PT to maximize function.    PT Treatment/Interventions  ADLs/Self Care Home Management;Cryotherapy;Electrical Stimulation;Moist Heat;DME Instruction;Gait training;Stair training;Functional mobility training;Therapeutic activities;Therapeutic exercise;Balance training;Neuromuscular re-education;Patient/family education;Manual techniques;Passive range of motion;Dry needling;Taping    PT Next Visit Plan  progress supine postural and core stabilization (TA) exercises with breathing    Consulted and Agree with Plan of Care  Patient       Patient will benefit from skilled therapeutic intervention in order to improve the following deficits and impairments:  Decreased activity tolerance, Decreased balance, Decreased mobility, Decreased range of motion, Decreased strength, Difficulty walking, Postural dysfunction, Pain  Visit Diagnosis: Chronic bilateral low back pain with left-sided sciatica  Muscle weakness (generalized)  Difficulty in walking, not elsewhere classified     Problem List Patient Active Problem List   Diagnosis Date Noted  . Near syncope   . Chest pain 12/17/2016  . Hypertension 12/17/2016  . Generalized weakness  12/17/2016  . Depression 12/17/2016  . Dehydration 12/17/2016  . Carotid disease, bilateral (HCC) 03/03/2015  . Chronic back pain 03/03/2014  . Seasonal allergies 03/03/2014  . Genital herpes 03/03/2014  . Hyperlipidemia 03/03/2013  . Anxiety and depression 12/11/2011  . Essential hypertension 11/19/2006      Clarita CraneStephanie F Werner Labella, PT, DPT 02/19/17 9:25 AM    [facility] [address] [place], [place], [address] Phone: [phone]   Fax:  [phone]  Name: Heather [name] MRN: [id number] Date of Birth: 10/17/1945

## 2017-02-20 ENCOUNTER — Other Ambulatory Visit: Payer: Self-pay | Admitting: Cardiovascular Disease

## 2017-02-21 ENCOUNTER — Ambulatory Visit (HOSPITAL_COMMUNITY)
Admission: RE | Admit: 2017-02-21 | Discharge: 2017-02-21 | Disposition: A | Discharge status: Home / Self Care | Payer: Medicare Other | Source: Ambulatory Visit | Attending: Vascular Surgery | Admitting: Vascular Surgery

## 2017-02-21 ENCOUNTER — Encounter: Payer: Self-pay | Admitting: Vascular Surgery

## 2017-02-21 ENCOUNTER — Ambulatory Visit (INDEPENDENT_AMBULATORY_CARE_PROVIDER_SITE_OTHER): Payer: Medicare Other | Admitting: Vascular Surgery

## 2017-02-21 VITALS — BP 135/62 | HR 53 | Temp 97.1°F | Resp 16 | Ht 65.0 in | Wt 162.0 lb

## 2017-02-21 DIAGNOSIS — I739 Peripheral vascular disease, unspecified: Secondary | ICD-10-CM

## 2017-02-21 DIAGNOSIS — I779 Disorder of arteries and arterioles, unspecified: Secondary | ICD-10-CM | POA: Diagnosis not present

## 2017-02-21 DIAGNOSIS — I6523 Occlusion and stenosis of bilateral carotid arteries: Secondary | ICD-10-CM | POA: Diagnosis not present

## 2017-02-21 LAB — VAS US CAROTID
LCCADDIAS: 19 cm/s
LCCADSYS: 94 cm/s
LEFT ECA DIAS: -13 cm/s
LEFT VERTEBRAL DIAS: -15 cm/s
LICADDIAS: -26 cm/s
LICADSYS: -121 cm/s
Left CCA prox dias: 21 cm/s
Left CCA prox sys: 112 cm/s
Left ICA prox dias: -35 cm/s
Left ICA prox sys: -152 cm/s
RCCADSYS: -105 cm/s
RCCAPDIAS: 18 cm/s
RCCAPSYS: 93 cm/s
RIGHT CCA MID DIAS: 19 cm/s
RIGHT ECA DIAS: -13 cm/s
RIGHT VERTEBRAL DIAS: 15 cm/s

## 2017-02-24 ENCOUNTER — Other Ambulatory Visit (INDEPENDENT_AMBULATORY_CARE_PROVIDER_SITE_OTHER): Payer: Self-pay

## 2017-02-24 DIAGNOSIS — M1611 Unilateral primary osteoarthritis, right hip: Secondary | ICD-10-CM

## 2017-02-24 DIAGNOSIS — M5136 Other intervertebral disc degeneration, lumbar region: Secondary | ICD-10-CM

## 2017-02-24 DIAGNOSIS — M48062 Spinal stenosis, lumbar region with neurogenic claudication: Secondary | ICD-10-CM

## 2017-02-24 MED ORDER — ACETAMINOPHEN-CODEINE #3 300-30 MG PO TABS: 1.0000 | ORAL_TABLET | Freq: Three times a day (TID) | ORAL | 0 refills | Status: DC | PRN

## 2017-02-24 NOTE — Addendum Note (Signed)
Addended by: Penne LashSHUE WILLS, Otis DialsHRISTY N on: 02/24/2017 09:37 AM   Modules accepted: Orders

## 2017-02-24 NOTE — Telephone Encounter (Signed)
Patient would like a Rx refill on Tylenol #3.  Cb# is [id number] [phone].  Please advise.  Thank you.

## 2017-02-25 NOTE — Telephone Encounter (Signed)
I called rx to [facility] patient is aware.

## 2017-02-26 ENCOUNTER — Ambulatory Visit: Payer: Medicare Other | Admitting: Physical Therapy

## 2017-02-26 ENCOUNTER — Encounter: Payer: Self-pay | Admitting: Physical Therapy

## 2017-02-26 DIAGNOSIS — G8929 Other chronic pain: Secondary | ICD-10-CM

## 2017-02-26 DIAGNOSIS — M5442 Lumbago with sciatica, left side: Secondary | ICD-10-CM | POA: Diagnosis not present

## 2017-02-26 DIAGNOSIS — R262 Difficulty in walking, not elsewhere classified: Secondary | ICD-10-CM

## 2017-02-26 DIAGNOSIS — M6281 Muscle weakness (generalized): Secondary | ICD-10-CM

## 2017-02-26 NOTE — Therapy (Signed)
[facility] [address] [place], [place], [address] Phone: [phone]   Fax:  [phone]  Physical Therapy Treatment  Patient Details  Name: Heather [name] MRN: [id number] Date of Birth: 18-Mar-1945 Referring Provider: Kerrin ChampagneJames E Nitka, MD   Encounter Date: 02/26/2017  PT End of Session - 02/26/17 1259    Visit Number  6    Number of Visits  17    Date for PT Re-Evaluation  04/07/17    Authorization Type  UHC Medicare/Medicaid [place]     PT Start Time  1106    PT Stop Time  1150    PT Time Calculation (min)  44 min    Activity Tolerance  Patient tolerated treatment well    Behavior During Therapy  [facility] for tasks assessed/performed       Past Medical History:  Diagnosis Date  . Anxiety   . Arthritis    "knees, right hip, lumbar back" (12/17/2016)  . Chicken pox   . Chronic lower back pain   . DDD (degenerative disc disease), lumbar   . Depression   . Headache 06/23/2015   "2d after carotid OR & again today" (12/17/2016)  . Heart murmur    MVP  . High cholesterol   . History of blood transfusion 1975   "related to femur  fracture" (12/17/2016)  . HOH (hard of hearing)    left  . Hypertension   . MVP (mitral valve prolapse)    echo 1986-mild  . 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 Shortness of breath dyspnea    WITH EXERTION    . Stroke [facility] 04/2015   "light stroke; sometimes my thinking is slow since" (12/17/2016)  . Wears glasses     Past Surgical History:  Procedure Laterality Date  . CARPAL TUNNEL RELEASE Right 03/31/2013   Procedure: RIGHT CARPAL TUNNEL RELEASE;  Surgeon: Nicki ReaperGary R Kuzma, MD;  Location: [facility];  Service: Orthopedics;  Laterality: Right;  . ENDARTERECTOMY Right 05/03/2015   Procedure: RIGHT CAROTID ENDARTERECTOMY ;  Surgeon: Fransisco HertzBrian L Chen, MD;  [facility];  Service: Vascular;   Laterality: Right;  . FEMUR FRACTURE SURGERY Right 1975   "hip"  . FRACTURE SURGERY    . NASAL SINUS SURGERY  1975  . PLANTAR FASCIA RELEASE Bilateral 1968  . SHOULDER OPEN ROTATOR CUFF REPAIR Right 1998  . TONSILLECTOMY  1951  . TUBAL LIGATION  1995    There were no vitals filed for this visit.  Subjective Assessment - 02/26/17 1112    Subjective  Getting an injection in her back tomorrow.  Has had injections in her hip and knee previously with mild relief.  Is performing the walking program and endurance has improved to 7 minutes.    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      Patient Stated Goals  To be more comfortable in her back     Pain Score  8     Pain Location  Back    Pain Orientation  Lower    Pain Descriptors / Indicators  Sharp;Sore                      [facility] Adult PT Treatment/Exercise -  02/26/17 1115      Self-Care   Self-Care  Other Self-Care Comments    Other Self-Care Comments   diaphragmatic/deep breathing sequence reclined in chair to slow RR and improve respiration      Lumbar Exercises: Stretches   Hip Flexor Stretch  Right;Left;1 rep;60 seconds while performing single knee to chest opp LE    Pelvic Tilt  3 reps;5 seconds posterior tilt      Lumbar Exercises: Aerobic   Tread Mill  2 minutes at 0.7 mph to warm up and focus on increased stance time, step length and stride length increasing to 1.2 mph x 6 minutes + 2 min cool down at 0.8 mph with bilat UE support but verbal cues to relax UE/neck, for deep breathing, slowed RR and increasing trunk/pelvic rotation      Lumbar Exercises: Supine   Bridge  5 reps;Non-compliant single leg, terminal hip ext off side of mat               PT Short Term Goals - 02/14/17 0945      PT SHORT TERM GOAL #1   Title  Pt will  participate in further assessment of LE ROM: hip flexor Thomas test and symptoms with exertion: 6 min walk    Baseline  Hip flexion: lacking 30 deg RLE, 10 deg LLE.  Lower abdominal strength 3/5; 6 minute walk 1129' no radicular pain reported    Time  4    Period  Weeks    Status  Achieved      PT SHORT TERM GOAL #2   Title  Pt will demonstrate independence with initial core, LE strengthening HEP and walking program    Baseline  dependent for exercises    Time  4    Period  Weeks    Status  New    Target Date  03/09/17      PT SHORT TERM GOAL #3   Title  Pt will decrease five time sit to stand to </= 20 seconds due to decreased pain and increased LE strength    Baseline  25.91    Time  4    Period  Weeks    Status  New    Target Date  03/09/17      PT SHORT TERM GOAL #4   Title  Pt will report 20-25% reduction in daily pain and radicular symptoms    Baseline  8-10/10 pain daily    Time  4    Period  Weeks    Status  New    Target Date  03/09/17      PT SHORT TERM GOAL #5   Title  Pt will improve hip flexor ROM by 5 deg bilaterally; will improve lower abdominal strength demonstrating ability to lower bilat LE to within 50 deg of mat    Baseline  lacking 30 deg to neutral RLE; lacking 10 deg to neutral LLE; lower abdominal strength 3/5 (lowered to 60 deg above mat; double leg lowering test)    Time  4    Period  Weeks    Status  New    Target Date  03/09/17        PT Long Term Goals - 02/14/17 1914      PT LONG TERM GOAL #1   Title  Pt will demonstrate independence with final HEP and walking program    Time  8    Period  Weeks    Status  New  Target Date  04/07/17      PT LONG TERM GOAL #2   Title  Pt will report 50% reduction in pain and radicular symptoms by D/C    Baseline  8-10/10 daily    Time  8    Period  Weeks    Status  New    Target Date  04/07/17      PT LONG TERM GOAL #3   Title  Pt will decrease five time sit to stand to </= 15 seconds due to  decreased pain and increased strength    Baseline  25.91 at eval    Time  8    Period  Weeks    Status  New    Target Date  04/07/17      PT LONG TERM GOAL #4   Title  Pt will demonstrate improved posture with decreased thoracic kyphosis and more equal WB through bilat pelvis in sitting, neutral rotation in standing    Baseline  increased kyphosis, sits with bias to R, in standing rotates pelvis/body to R    Time  8    Period  Weeks    Status  New      PT LONG TERM GOAL #5   Title  Pt will improve 6 minute walk by 100' reporting pain as </= 5/10    Baseline  1129; no radicular symptoms    Time  8    Period  Weeks    Status  New    Target Date  04/07/17      PT LONG TERM GOAL #6   Title  Will improve bilat LE hip flexion ROM by 10 for increased LE extension; will improve lower abdominal strength to 4/5    Baseline  Thomas test: lacking 30 deg on RLE, 10 deg on LLE.  3/5 lower abdominal strength    Time  8    Period  Weeks    Status  New    Target Date  04/07/17            Plan - 02/26/17 1150    Clinical Impression Statement  Continued endurance training on the treadmill, use of diaphragmatic breathing for core activation training, hip flexor stretching and hip extensor strengthening in supine.  Pt demonstrates difficulty with maintaining core activation and lumbar stabilization with hip extension.  Pt would benefit from performing hip flexor stretching each day; will provide pt with handout and review at next session.  Pt reported improvement in back pain by end of session.    PT Treatment/Interventions  ADLs/Self Care Home Management;Cryotherapy;Electrical Stimulation;Moist Heat;DME Instruction;Gait training;Stair training;Functional mobility training;Therapeutic activities;Therapeutic exercise;Balance training;Neuromuscular re-education;Patient/family education;Manual techniques;Passive range of motion;Dry needling;Taping    PT Next Visit Plan  review hip flexor stretch off  side of bed and give pt picture of stretch, progress supine postural and core stabilization (TA) exercises with breathing, hip extension strengthening, increase mobility at T-spine    Consulted and Agree with Plan of Care  Patient       Patient will benefit from skilled therapeutic intervention in order to improve the following deficits and impairments:  Decreased activity tolerance, Decreased balance, Decreased mobility, Decreased range of motion, Decreased strength, Difficulty walking, Postural dysfunction, Pain  Visit Diagnosis: Chronic bilateral low back pain with left-sided sciatica  Muscle weakness (generalized)  Difficulty in walking, not elsewhere classified     Problem List Patient Active Problem List   Diagnosis Date Noted  . Near syncope   . Chest pain 12/17/2016  .  Hypertension 12/17/2016  . Generalized weakness 12/17/2016  . Depression 12/17/2016  . Dehydration 12/17/2016  . Carotid disease, bilateral (HCC) 03/03/2015  . Chronic back pain 03/03/2014  . Seasonal allergies 03/03/2014  . Genital herpes 03/03/2014  . Hyperlipidemia 03/03/2013  . Anxiety and depression 12/11/2011  . Essential hypertension 11/19/2006   Dierdre Highman, PT, DPT 02/26/17    1:03 PM    [facility] [address] [place], [place], [address] Phone: [phone]   Fax:  [phone]  Name: Heather [name] MRN: [id number] Date of Birth: [birth year]

## 2017-02-27 ENCOUNTER — Encounter (INDEPENDENT_AMBULATORY_CARE_PROVIDER_SITE_OTHER): Payer: Self-pay | Admitting: Physical Medicine and Rehabilitation

## 2017-02-27 ENCOUNTER — Ambulatory Visit (INDEPENDENT_AMBULATORY_CARE_PROVIDER_SITE_OTHER): Payer: Medicare Other | Admitting: Physical Medicine and Rehabilitation

## 2017-02-27 ENCOUNTER — Ambulatory Visit (INDEPENDENT_AMBULATORY_CARE_PROVIDER_SITE_OTHER): Payer: Medicare Other

## 2017-02-27 DIAGNOSIS — M461 Sacroiliitis, not elsewhere classified: Secondary | ICD-10-CM

## 2017-02-27 NOTE — Progress Notes (Signed)
Heather [name] [facility] - 72 y.o. female MRN [id number]  Date of birth: 1945-11-24  Office Visit Note: Visit Date: 02/27/2017 PCP: Lorre Munroe, NP Referred by: Lorre Munroe, NP  Subjective: Chief Complaint  Patient presents with  . Lower Back - Pain   HPI: Heather [name] is a 72 year old female followed by Dr. Otelia Sergeant who comes in today at the request of Zonia Kief for diagnostic and hopefully therapeutic left sacroiliac joint injection with fluoroscopic guidance.  She reports mostly left-sided axial low back pain that is been present for quite a long time.  She reports that sitting and laying and standing makes the pain worse sitting for a short time does ease the pain to a degree.  She is failed all other manner of conservative care.  I have seen her in the past for right hip injection which was performed in 2017.  Also has an MRI of the lumbar spine from 2016 showing pretty severe multifactorial stenosis at L3-4.    ROS Otherwise per HPI.  Assessment & Plan: Visit Diagnoses:  1. Sacroiliitis (HCC)     Plan: No additional findings.   Meds & Orders: No orders of the defined types were placed in this encounter.   Orders Placed This Encounter  Procedures  . Sacroiliac Joint Inj  . XR C-ARM NO REPORT    Follow-up: No Follow-up on file.   Procedures: Sacroiliac Joint Inj on 02/27/2017 2:10 PM Indications: pain and diagnostic evaluation Details: 22 G 3.5 in needle, fluoroscopy-guided posterior approach Medications: 2 mL bupivacaine 0.5 %; 80 mg methylPREDNISolone acetate 80 MG/ML Outcome: tolerated well, no immediate complications  There was excellent flow of contrast producing a partial arthrogram of the sacroiliac joint.  Procedure, treatment alternatives, risks and benefits explained, specific risks discussed. Consent was given by the patient. Immediately prior to procedure a time out was called to verify the correct patient, procedure, equipment, support staff and site/side marked  as required. Patient was prepped and draped in the usual sterile fashion.      No notes on file   Clinical History: MRI LUMBAR SPINE WITHOUT CONTRAST  TECHNIQUE: Multiplanar, multisequence MR imaging of the lumbar spine was performed. No intravenous contrast was administered.  COMPARISON:  None.  FINDINGS: Convex right scoliosis is seen. Vertebral body height is maintained. Trace anterolisthesis L2 on L3 is secondary to facet degenerative disease. Alignment is otherwise normal. Degenerative endplate signal change appears worst at L3-4 and L5-S1. The conus medullaris is normal in signal and position. Imaged intra-abdominal contents are unremarkable.  The T11-12 and T12-L1 levels are imaged in the sagittal plane only and negative.  L1-2: Shallow disc bulge more prominent to the left causes very mild narrowing in the left lateral recess. The foramina are open. No nerve root compression.  L2-3: There is facet degenerative disease and a shallow broad-based disc bulge. Mild central canal narrowing is seen. The foramina are open.  L3-4: There is a diffuse broad-based disc bulge with some ligamentum flavum thickening and facet arthropathy. Severe central canal stenosis is present. Mild to moderate bilateral foraminal narrowing is identified.  L4-5: The patient has a shallow broad-based disc bulge. There is a focal disc protrusion in the left foramen impinging on the exiting left L4 root. The right foramen is open. The central canal is mildly to moderately narrowed.  L5-S1: Shallow disc bulge endplate spur without central canal stenosis. Mild right foraminal narrowing is seen. The left foramen is open.  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  Disc protrusion in the left foramen at L4-5 causes marked foraminal narrowing and encroachment on the exiting  left L4 root.   Electronically Signed   By: Drusilla Kannerhomas  Dalessio M.D.   On: 11/23/2014 11:29  She reports that  has never smoked. she has never used smokeless tobacco. No results for input(s): HGBA1C, LABURIC in the last 8760 hours.  Objective:  VS:  HT:    WT:   BMI:     BP:   HR: bpm  TEMP: ( )  RESP:  Physical Exam  Musculoskeletal:  Patient does have pain over the left PSIS and a positive Fortin finger sign.  She has equivocal Patrick's testing with external rotation.    Ortho Exam Imaging: No results found.  Past Medical/Family/Surgical/Social History: Medications & Allergies reviewed per EMR Patient Active Problem List   Diagnosis Date Noted  . Near syncope   . Chest pain 12/17/2016  . Hypertension 12/17/2016  . Generalized weakness 12/17/2016  . Depression 12/17/2016  . Dehydration 12/17/2016  . Carotid disease, bilateral (HCC) 03/03/2015  . Chronic back pain 03/03/2014  . Seasonal allergies 03/03/2014  . Genital herpes 03/03/2014  . Hyperlipidemia 03/03/2013  . Anxiety and depression 12/11/2011  . Essential hypertension 11/19/2006   Past Medical History:  Diagnosis Date  . Anxiety   . Arthritis    "knees, right hip, lumbar back" (12/17/2016)  . Chicken pox   . Chronic lower back pain   . DDD (degenerative disc disease), lumbar   . Depression   . Headache 06/23/2015   "2d after carotid OR & again today" (12/17/2016)  . Heart murmur    MVP  . High cholesterol   . History of blood transfusion 1975   "related to femur  fracture" (12/17/2016)  . HOH (hard of hearing)    left  . Hypertension   . MVP (mitral valve prolapse)    echo 1986-mild  . 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 Shortness of breath dyspnea    WITH EXERTION    . Stroke [facility](HCC) 04/2015   "light stroke; sometimes my thinking is slow since" (12/17/2016)  . Wears glasses    Family History  Adopted:  Yes  Problem Relation Age of Onset  . Osteoporosis Mother   . Heart disease Mother   . Hypertension Mother   . Hyperlipidemia Mother   . Stroke Mother   . Arthritis Mother   . Diabetes Father   . Heart attack Father   . Cancer Sister        breast  . Colon cancer Neg Hx    Past Surgical History:  Procedure Laterality Date  . CARPAL TUNNEL RELEASE Right 03/31/2013   Procedure: RIGHT CARPAL TUNNEL RELEASE;  Surgeon: Nicki ReaperGary R Kuzma, MD;  Location: [facility];  Service: Orthopedics;  Laterality: Right;  . ENDARTERECTOMY Right 05/03/2015   Procedure: RIGHT CAROTID ENDARTERECTOMY ;  Surgeon: Fransisco HertzBrian L Chen, MD;  [facility];  Service: Vascular;  Laterality: Right;  . FEMUR FRACTURE SURGERY Right 1975   "hip"  . FRACTURE SURGERY    . NASAL SINUS SURGERY  1975  . PLANTAR FASCIA RELEASE Bilateral 1968  . SHOULDER OPEN ROTATOR CUFF REPAIR Right 1998  . TONSILLECTOMY  1951  . TUBAL LIGATION  1995   Social History   Occupational History  .  Occupation: Retired  Tobacco Use  . Smoking status: Never Smoker  . Smokeless tobacco: Never Used  Substance and Sexual Activity  . Alcohol use: Yes    Alcohol/week: 1.8 oz    Types: 3 Cans of beer per week  . Drug use: No  . Sexual activity: Yes

## 2017-02-27 NOTE — Progress Notes (Deleted)
Pt states lower back mostly on the left side. Pt states pain has been there for a long time. Pt states sitting, laying, and standing makes the pain worse, pt states sitting for a short period of time eases it. -Dye Allergies.

## 2017-02-27 NOTE — Patient Instructions (Signed)

## 2017-02-28 ENCOUNTER — Ambulatory Visit: Payer: Medicare Other | Admitting: Physical Therapy

## 2017-03-05 ENCOUNTER — Encounter: Payer: Self-pay | Admitting: Neurology

## 2017-03-05 ENCOUNTER — Encounter: Payer: Self-pay | Admitting: Physical Therapy

## 2017-03-05 ENCOUNTER — Ambulatory Visit: Payer: Medicare Other | Admitting: Physical Therapy

## 2017-03-05 ENCOUNTER — Ambulatory Visit (INDEPENDENT_AMBULATORY_CARE_PROVIDER_SITE_OTHER): Payer: Medicare Other | Admitting: Neurology

## 2017-03-05 VITALS — BP 146/54 | HR 49 | Ht 65.0 in | Wt 160.5 lb

## 2017-03-05 DIAGNOSIS — G523 Disorders of hypoglossal nerve: Secondary | ICD-10-CM

## 2017-03-05 MED ORDER — METHYLPREDNISOLONE ACETATE 80 MG/ML IJ SUSP
80.0000 mg | INTRAMUSCULAR | Status: AC | PRN
  Administered 2017-02-27: 80 mg via INTRA_ARTICULAR

## 2017-03-05 MED ORDER — BUPIVACAINE HCL 0.5 % IJ SOLN
2.0000 mL | INTRAMUSCULAR | Status: AC | PRN
  Administered 2017-02-27: 2 mL via INTRA_ARTICULAR

## 2017-03-05 NOTE — Therapy (Signed)
[facility] [address] [place], [place], [address] Phone: [phone]   Fax:  [phone]  Patient Details  Name: Heather [name] MRN: [id number] Date of Birth: 1946/01/27 Referring Provider:  No ref. provider found  Encounter Date: 03/05/2017  PHYSICAL THERAPY DISCHARGE SUMMARY  Visits from Start of Care: 6  Current functional level related to goals / functional outcomes: Unable to formally assess; pt did not return following SI injection due to increased LE cramping and pain.  Pt advised to contact MD that provided injection and let him know of the side effects she is experiencing.  Pt states she has been cleared medically for back surgery and would like to D/C from therapy for now.  Discussed with pt returning to therapy after surgery.     Remaining deficits: Pain, impaired strength, ROM, impaired gait   Education / Equipment: HEP  Plan: Patient agrees to discharge.  Patient goals were not met. Patient is being discharged due to the patient's request.  ?????     Rico Junker, PT, DPT 03/05/17    11:57 AM    [facility] [address] [place], [place], [address] Phone: [phone]   Fax:  [phone]

## 2017-03-05 NOTE — Progress Notes (Signed)
Reason for visit: Hypoglossal neuropathy  Heather [name] is an 72 y.o. 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  The tongue strength and facial strength have improved, the patient has some mild residual facial asymmetry with talking.  She may note some drooling at times on the right corner of the mouth.  She denies any issues with swallowing or choking.  The patient has recently had a carotid Doppler study that was unremarkable.  She is off of gabapentin, she is no longer having any right-sided neck or occipital pain.  The patient does have ongoing back pain, she is able to walk for only about 6 minutes before she has to rest at this point.  She is followed by Dr. Otelia SergeantNitka for this.  Past Medical History:  Diagnosis Date  . Anxiety   . Arthritis    "knees, right hip, lumbar back" (12/17/2016)  . Chicken pox   . Chronic lower back pain   . DDD (degenerative disc disease), lumbar   . Depression   . Headache 06/23/2015   "2d after carotid OR & again today" (12/17/2016)  . Heart murmur    MVP  . High cholesterol   . History of blood transfusion 1975   "related to femur  fracture" (12/17/2016)  . HOH (hard of hearing)    left  . Hypertension   . MVP (mitral valve prolapse)    echo 1986-mild  . 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 Shortness of breath dyspnea    WITH EXERTION    . Stroke [facility](HCC) 04/2015   "light stroke; sometimes my thinking is slow since" (12/17/2016)  . Wears glasses     Past Surgical History:  Procedure Laterality Date  . CARPAL TUNNEL RELEASE Right 03/31/2013   Procedure: RIGHT CARPAL TUNNEL RELEASE;  Surgeon: Nicki ReaperGary R Kuzma, MD;  Location: [facility];  Service: Orthopedics;  Laterality: Right;  . ENDARTERECTOMY Right 05/03/2015   Procedure: RIGHT CAROTID ENDARTERECTOMY ;  Surgeon: Fransisco HertzBrian L Chen, MD;  [facility];  Service: Vascular;  Laterality: Right;  . FEMUR FRACTURE SURGERY Right 1975   "hip"  . FRACTURE SURGERY    . NASAL SINUS SURGERY  1975  . PLANTAR FASCIA RELEASE Bilateral 1968  . SHOULDER OPEN ROTATOR CUFF REPAIR Right 1998  . TONSILLECTOMY  1951  . TUBAL LIGATION  1995    Family History  Adopted: Yes  Problem Relation Age of Onset  . Osteoporosis Mother   . Heart disease Mother   . Hypertension Mother   . Hyperlipidemia Mother   . Stroke Mother   . Arthritis Mother   . Diabetes Father   . Heart attack Father   . Cancer Sister        breast  . Colon cancer Neg Hx     Social history:  reports that  has never smoked. she has never used smokeless tobacco. She reports that she drinks about 1.8 oz of alcohol per week. She reports that she does not use drugs.    Allergies  Allergen Reactions  . Sulfamethoxazole Swelling    Unspecified area of swelling    Medications:  Prior to Admission medications   Medication Sig Start Date End Date Taking?  Authorizing Provider  acetaminophen-codeine (TYLENOL #3) 300-30 MG tablet Take 1 tablet by mouth every 8 (eight) hours as needed. for pain 02/24/17  Yes Kerrin Champagne, MD  Ascorbic Acid (VITAMIN C) 1000 MG tablet Take 1,000 mg by mouth daily.   Yes [provider]  aspirin EC 81 MG tablet Take 1 tablet (81 mg total) by mouth daily. 03/08/15  Yes Nahser, Deloris Ping, MD  atorvastatin (LIPITOR) 40 MG tablet Take 1 tablet (40 mg total) by mouth daily. Please keep upcoming appt for future refills. Thanks 12/31/16  Yes Nahser, Deloris Ping, MD  busPIRone (BUSPAR) 7.5 MG tablet Take 1 tablet (7.5 mg total) by mouth 2 (two) times daily. 02/03/17  Yes Baity, Salvadore Oxford, NP  Cholecalciferol (VITAMIN D3) 1000 UNITS CAPS Take 1 capsule by mouth daily.    Yes [provider]   lidocaine (XYLOCAINE) 2 % jelly Apply 1 application topically as needed. Apply to the skin over the knees and hips up to BID 07/03/16  Yes Kerrin Champagne, MD  loratadine (CLARITIN) 10 MG tablet Take 10 mg by mouth daily.     Yes [provider]  losartan (COZAAR) 100 MG tablet TAKE 1 TABLET BY MOUTH EVERY DAY 02/20/17  Yes Nahser, Deloris Ping, MD  meloxicam (MOBIC) 15 MG tablet Take 1 tablet (15 mg total) by mouth daily. 02/21/16  Yes Kerrin Champagne, MD  metoprolol tartrate (LOPRESSOR) 25 MG tablet TAKE 1 TABLET BY MOUTH TWICE A DAY 12/05/16  Yes Nahser, Deloris Ping, MD  Multiple Vitamin (MULTIVITAMIN) tablet Take 1 tablet by mouth daily.     Yes [provider]  sertraline (ZOLOFT) 50 MG tablet TAKE 2 TABLETS BY MOUTH EVERY DAY 02/10/17  Yes Baity, Salvadore Oxford, NP  valACYclovir (VALTREX) 500 MG tablet TAKE 1 TABLET BY MOUTH DAILY**CAN INCREASE TO 2 TABS FOR 5 DAY IN THE EVENT OF RECURRENCE** 09/17/16  Yes Constant, Peggy, MD    ROS:  Out of a complete 14 system review of symptoms, the patient complains only of the following symptoms, and all other reviewed systems are negative.  Environmental allergies Frequency of urination Joint pain, back pain, muscle cramps Restless legs Drooling  Blood pressure (!) 146/54, pulse (!) 49, height 5\' 5"  (1.651 m), weight 160 lb 8 oz (72.8 kg), last menstrual period 01/08/2012.  Physical Exam  General: The patient is alert and cooperative at the time of the examination.  Skin: No significant peripheral edema is noted.   Neurologic Exam  Mental status: The patient is alert and oriented x 3 at the time of the examination. The patient has apparent normal recent and remote memory, with an apparently normal attention span and concentration ability.   Cranial nerves: Facial symmetry is present. Speech is normal, no aphasia or dysarthria is noted. Extraocular movements are full. Visual fields are full.  Motor: The patient has good strength in all 4  extremities.  Sensory examination: Soft touch sensation is symmetric on the face, arms, and legs.  Coordination: The patient has good finger-nose-finger and heel-to-shin bilaterally.  Gait and station: The patient has a normal gait. Tandem gait is normal. Romberg is negative. No drift is seen.  Reflexes: Deep tendon reflexes are symmetric.   Carotid doppler 02/21/17:  Final Interpretation: Right Carotid: Velocities in the right ICA are consistent with a 1-39% stenosis,        with a patent endarterectomy site.  Left Carotid: Velocities in the left ICA are consistent with a 1-39% stenosis.  Assessment/Plan:  1.  Status post carotid enterectomy on the right  2.  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 Otelia Sergeant for her low back issues.  The patient has just recently had a carotid Doppler study that is unremarkable.  She has had near resolution of the hypoglossal neuropathy and marginal mandibular neuropathy.  At this point, the patient is off of gabapentin, she is no longer having right neck and occipital pain.  She will follow-up through this office on as-needed basis.  Marlan Palau MD 03/05/2017 10:48 AM  [facility] [address] [place], [place] [address]  Phone [phone] Fax [phone]

## 2017-03-06 ENCOUNTER — Other Ambulatory Visit: Payer: Self-pay | Admitting: Cardiovascular Disease

## 2017-03-07 ENCOUNTER — Other Ambulatory Visit: Payer: Self-pay | Admitting: Neurology

## 2017-03-07 ENCOUNTER — Ambulatory Visit: Payer: Medicare Other | Admitting: Physical Therapy

## 2017-03-10 ENCOUNTER — Ambulatory Visit: Payer: Medicare Other | Admitting: Physical Therapy

## 2017-03-13 ENCOUNTER — Ambulatory Visit: Payer: Medicare Other | Admitting: Physical Therapy

## 2017-03-14 ENCOUNTER — Other Ambulatory Visit (INDEPENDENT_AMBULATORY_CARE_PROVIDER_SITE_OTHER): Payer: Self-pay | Admitting: Specialist

## 2017-03-14 DIAGNOSIS — M5136 Other intervertebral disc degeneration, lumbar region: Secondary | ICD-10-CM

## 2017-03-14 DIAGNOSIS — M48062 Spinal stenosis, lumbar region with neurogenic claudication: Secondary | ICD-10-CM

## 2017-03-14 DIAGNOSIS — M1611 Unilateral primary osteoarthritis, right hip: Secondary | ICD-10-CM

## 2017-03-14 NOTE — Telephone Encounter (Signed)
Patient called requesting an RX refill on her Tylenol #3.  [facility] [phone].  Thank you.

## 2017-03-17 ENCOUNTER — Ambulatory Visit: Payer: Medicare Other | Admitting: Physical Therapy

## 2017-03-18 MED ORDER — ACETAMINOPHEN-CODEINE #3 300-30 MG PO TABS: 1.0000 | ORAL_TABLET | Freq: Three times a day (TID) | ORAL | 0 refills | Status: DC | PRN

## 2017-03-18 NOTE — Telephone Encounter (Signed)
Called to [facility], advised patient this patient this has been done

## 2017-03-20 ENCOUNTER — Ambulatory Visit: Payer: Medicare Other | Admitting: Physical Therapy

## 2017-03-24 ENCOUNTER — Ambulatory Visit: Payer: Medicare Other | Admitting: Physical Therapy

## 2017-03-27 ENCOUNTER — Ambulatory Visit: Payer: Medicare Other | Admitting: Physical Therapy

## 2017-03-28 ENCOUNTER — Ambulatory Visit (INDEPENDENT_AMBULATORY_CARE_PROVIDER_SITE_OTHER): Payer: Medicare Other | Admitting: Specialist

## 2017-03-28 ENCOUNTER — Encounter (INDEPENDENT_AMBULATORY_CARE_PROVIDER_SITE_OTHER): Payer: Self-pay | Admitting: Specialist

## 2017-03-28 VITALS — BP 130/60 | HR 57 | Ht 65.0 in | Wt 166.0 lb

## 2017-03-28 DIAGNOSIS — M4316 Spondylolisthesis, lumbar region: Secondary | ICD-10-CM

## 2017-03-28 DIAGNOSIS — M48062 Spinal stenosis, lumbar region with neurogenic claudication: Secondary | ICD-10-CM

## 2017-03-28 NOTE — Patient Instructions (Signed)
Avoid bending, stooping and avoid lifting weights greater than 10 lbs. Avoid prolong standing and walking. Order for a new walker with wheels. Surgery scheduling secretary Tivis RingerSherri Billings, will call you in the next week to schedule for surgery.  Surgery recommended is a two level lumbar bilateral partial hemilaminectomies L4-5 and L3-4 this would be done with a microscope.Take hydrocodone for for pain. Risk of surgery includes risk of infection 1 in 200 patients, bleeding 1/2% chance you would need a transfusion.   Risk to the nerves is one in 10,000.  Expect improved walking and standing tolerance. Expect relief of leg pain but numbness may persist depending on the length and degree of pressure that has been present.

## 2017-03-28 NOTE — Progress Notes (Signed)
Office Visit Note   Patient: Heather [name]           Date of Birth: May 19, 1945           MRN: [id number] Visit Date: 03/28/2017              Requested by: Lorre Munroe, NP [address] [place], [place] [address] PCP: Lorre Munroe, NP   Assessment & Plan: Visit Diagnoses:  1. Spinal stenosis of lumbar region with neurogenic claudication   2. Spondylolisthesis, lumbar region     Plan: Avoid bending, stooping and avoid lifting weights greater than 10 lbs. Avoid prolong standing and walking. Order for a new walker with wheels. Surgery scheduling secretary Tivis Ringer, will call you in the next week to schedule for surgery.  Surgery recommended is a two level lumbar bilateral partial hemilaminectomies L4-5 and L3-4 this would be done with a microscope.Take hydrocodone for for pain. Risk of surgery includes risk of infection 1 in 200 patients, bleeding 1/2% chance you would need a transfusion.   Risk to the nerves is one in 10,000.  Expect improved walking and standing tolerance. Expect relief of leg pain but numbness may persist depending on the length and degree of pressure that has been present.  Follow-Up Instructions: Return in about 4 weeks (around 04/25/2017).   Orders:  No orders of the defined types were placed in this encounter.  No orders of the defined types were placed in this encounter.     Procedures: No procedures performed   Clinical Data: No additional findings.   Subjective: Chief Complaint  Patient presents with  . Lower Back - Follow-up    72 year old female with ASPVD, carotid disease and lumbar spinal stenosis. She is standing and walking to improve her endurance. She having problems with cramping in the legs.  She is experiencing pain in her back with cramping into her legs. Taking K, Ca and Mg to try and decrease the spasm. Recent doppler studies of the neck have come back good. She experiences numbness and tingling into the  legs right greater than left. Walking as much as 6-8 minutes before she has to sit down to recover. She does vigorous walking.  No bowel or bladder difficulty except for frequency and urgency. No falls, but is always aware of it. She reports slowing down. Wears shoes all the time for support. She has been in PT, post injection she hurt for about a months. Started PT and stopped around the first of February.     Review of Systems  Constitutional: Negative.   HENT: Negative.   Eyes: Negative.   Respiratory: Negative.   Cardiovascular: Negative.   Gastrointestinal: Negative.   Endocrine: Negative.   Genitourinary: Negative.   Musculoskeletal: Negative.   Skin: Negative.   Allergic/Immunologic: Negative.   Neurological: Negative.   Hematological: Negative.   Psychiatric/Behavioral: Negative.      Objective: Vital Signs: BP 130/60 (BP Location: Left Arm, Patient Position: Sitting)   Pulse (!) 57   Ht 5\' 5"  (1.651 m)   Wt 166 lb (75.3 kg)   LMP 01/08/2012   BMI 27.62 kg/m   Physical Exam  Constitutional: She is oriented to person, place, and time. She appears well-developed and well-nourished.  HENT:  Head: Normocephalic and atraumatic.  Eyes: EOM are normal. Pupils are equal, round, and reactive to light.  Neck: Normal range of motion. Neck supple.  Pulmonary/Chest: Effort normal and breath sounds normal.  Abdominal:  Soft. Bowel sounds are normal.  Neurological: She is [name] and oriented to person, place, and time.  Skin: Skin is warm and dry.  Psychiatric: She has a normal mood and affect. Her behavior is normal. Judgment and thought content normal.    Back Exam   Tenderness  The patient is experiencing tenderness in the lumbar.  Range of Motion  Extension: abnormal  Flexion: abnormal  Lateral bend right: normal  Lateral bend left: normal  Rotation right: normal  Rotation left: normal   Muscle Strength  The patient has normal back strength. Right Quadriceps:   5/5  Left Quadriceps:  5/5  Right Hamstrings:  5/5  Left Hamstrings:  5/5   Tests  Straight leg raise right: negative Straight leg raise left: negative  Reflexes  Patellar: normal Achilles: normal Babinski's sign: normal   Other  Toe walk: normal Heel walk: normal Sensation: decreased Gait: abnormal  Erythema: no back redness Scars: absent      Specialty Comments:  No specialty comments available.  Imaging: No results found.   PMFS History: Patient Active Problem List   Diagnosis Date Noted  . Near syncope   . Chest pain 12/17/2016  . Hypertension 12/17/2016  . Generalized weakness 12/17/2016  . Depression 12/17/2016  . Dehydration 12/17/2016  . Carotid disease, bilateral (HCC) 03/03/2015  . Chronic back pain 03/03/2014  . Seasonal allergies 03/03/2014  . Genital herpes 03/03/2014  . Hyperlipidemia 03/03/2013  . Anxiety and depression 12/11/2011  . Essential hypertension 11/19/2006   Past Medical History:  Diagnosis Date  . Anxiety   . Arthritis    "knees, right hip, lumbar back" (12/17/2016)  . Chicken pox   . Chronic lower back pain   . DDD (degenerative disc disease), lumbar   . Depression   . Headache 06/23/2015   "2d after carotid OR & again today" (12/17/2016)  . Heart murmur    MVP  . High cholesterol   . History of blood transfusion 1975   "related to femur  fracture" (12/17/2016)  . HOH (hard of hearing)    left  . Hypertension   . MVP (mitral valve prolapse)    echo 1986-mild  . 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 Shortness of breath dyspnea    WITH EXERTION    . Stroke [facility](HCC) 04/2015   "light stroke; sometimes my thinking is slow since" (12/17/2016)  . Wears glasses     Family History  Adopted: Yes  Problem Relation Age of Onset  . Osteoporosis Mother   . Heart disease Mother   . Hypertension Mother   . Hyperlipidemia Mother   .  Stroke Mother   . Arthritis Mother   . Diabetes Father   . Heart attack Father   . Cancer Sister        breast  . Colon cancer Neg Hx     Past Surgical History:  Procedure Laterality Date  . CARPAL TUNNEL RELEASE Right 03/31/2013   Procedure: RIGHT CARPAL TUNNEL RELEASE;  Surgeon: Nicki ReaperGary R Kuzma, MD;  Location: [facility];  Service: Orthopedics;  Laterality: Right;  . ENDARTERECTOMY Right 05/03/2015   Procedure: RIGHT CAROTID ENDARTERECTOMY ;  Surgeon: Fransisco HertzBrian L Chen, MD;  [facility];  Service: Vascular;  Laterality: Right;  . FEMUR FRACTURE SURGERY Right 1975   "hip"  . FRACTURE SURGERY    . NASAL SINUS SURGERY  1975  . PLANTAR FASCIA RELEASE  Bilateral 1968  . SHOULDER OPEN ROTATOR CUFF REPAIR Right 1998  . TONSILLECTOMY  1951  . TUBAL LIGATION  1995   Social History   Occupational History  . Occupation: Retired  Tobacco Use  . Smoking status: Never Smoker  . Smokeless tobacco: Never Used  Substance and Sexual Activity  . Alcohol use: Yes    Alcohol/week: 1.8 oz    Types: 3 Cans of beer per week  . Drug use: No  . Sexual activity: Yes

## 2017-03-31 ENCOUNTER — Ambulatory Visit: Payer: Medicare Other | Admitting: Physical Therapy

## 2017-04-02 ENCOUNTER — Telehealth: Payer: Self-pay

## 2017-04-02 NOTE — Telephone Encounter (Signed)
   [facility] Pre-operative Risk Assessment    Request for surgical clearance:  1. What type of surgery is being performed? L3-4, L4-5, Bilateral Partial Hemilaminectomy   2. When is this surgery scheduled? Will be scheduled once cleared for surgery   3. What type of clearance is required (medical clearance vs. Pharmacy clearance to hold med vs. Both)? Cardiac Clearance  4. Are there any medications that need to be held prior to surgery and how long? Not stated   5. Practice name and name of physician performing surgery? [facility]; Dr. Basil Dess   6. What is your office phone and fax number? Phone [phone] and Fax [phone]   7. Anesthesia type (None, local, MAC, general) ? Not stated   Mady Haagensen 04/02/2017, 12:41 PM  _________________________________________________________________   (provider comments below)

## 2017-04-03 ENCOUNTER — Other Ambulatory Visit: Payer: Self-pay | Admitting: Neurology

## 2017-04-03 ENCOUNTER — Ambulatory Visit: Payer: Medicare Other | Admitting: Physical Therapy

## 2017-04-03 ENCOUNTER — Other Ambulatory Visit (INDEPENDENT_AMBULATORY_CARE_PROVIDER_SITE_OTHER): Payer: Self-pay | Admitting: Specialist

## 2017-04-03 DIAGNOSIS — M1611 Unilateral primary osteoarthritis, right hip: Secondary | ICD-10-CM

## 2017-04-03 DIAGNOSIS — M5136 Other intervertebral disc degeneration, lumbar region: Secondary | ICD-10-CM

## 2017-04-03 DIAGNOSIS — M48062 Spinal stenosis, lumbar region with neurogenic claudication: Secondary | ICD-10-CM

## 2017-04-03 MED ORDER — ACETAMINOPHEN-CODEINE #3 300-30 MG PO TABS: 1.0000 | ORAL_TABLET | Freq: Three times a day (TID) | ORAL | 0 refills | Status: DC | PRN

## 2017-04-03 NOTE — Telephone Encounter (Signed)
   Primary Cardiologist: No primary care provider on file.  Chart reviewed as part of pre-operative protocol coverage. Patient was contacted 04/03/2017 in reference to pre-operative risk assessment for pending surgery as outlined below.  Heather [name] was last seen on 02/04/17 by Dr Elease HashimotoNahser.  Since that day, Heather [name] has done well, she has no active cardiac issues.  Therefore, based on ACC/AHA guidelines, the patient would be at acceptable risk for the planned procedure without further cardiovascular testing.   I will route this recommendation to the requesting party via Epic fax function and remove from pre-op pool.  Please call with questions.  Corine ShelterLuke Devynn Scheff, PA-C 04/03/2017, 4:21 PM

## 2017-04-03 NOTE — Telephone Encounter (Signed)
Med refill  Tylenol #3 300-30mg  tablet

## 2017-04-04 NOTE — Telephone Encounter (Signed)
[facility] and pt has been advised this has been done

## 2017-04-06 ENCOUNTER — Other Ambulatory Visit: Payer: Self-pay | Admitting: Cardiovascular Disease

## 2017-04-09 IMAGING — CT CT ANGIO HEAD
1 of 11 series · 3 of 33 positions shown · IV contrast (OMNI 350)
Comparison: None.

CLINICAL DATA: Right carotid endarterectomy 3 days ago. New onset
headache beginning at 6 p.m. today.

EXAM:
CT ANGIOGRAPHY HEAD AND NECK
TECHNIQUE: Multidetector CT imaging of the head and neck was performed using
the standard protocol during bolus administration of intravenous
contrast. Multiplanar CT image reconstructions and MIPs were
obtained to evaluate the vascular anatomy. Carotid stenosis
measurements (when applicable) are obtained utilizing NASCET
criteria, using the distal internal carotid diameter as the
denominator.
CONTRAST:  100 mL Isovue 370. The first dose of 50 mL was mistimed
due to scanner malfunction. The patient was re-dosed an additional
50 ml of contrast for a total of 100mL.

[Series 12: cta neck axial · axial · 0.39mm/px · z∈[-274,-100]mm · 3 of 351 slices shown]
[im 88/351  soft-tissue]
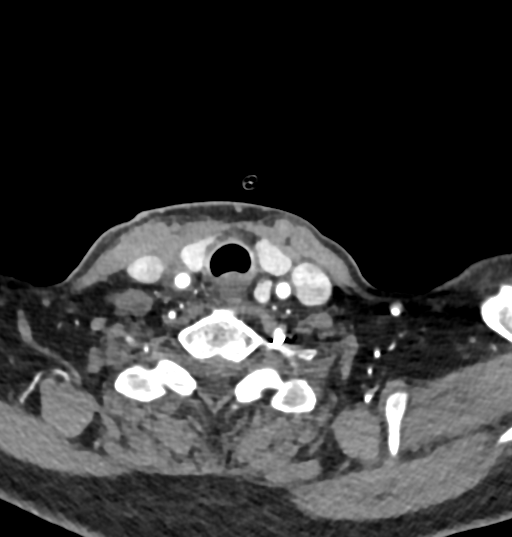
[im 176/351  bone]
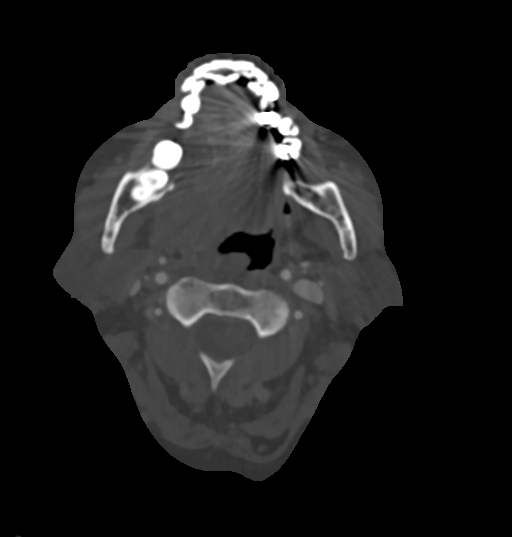
[im 263/351  soft-tissue]
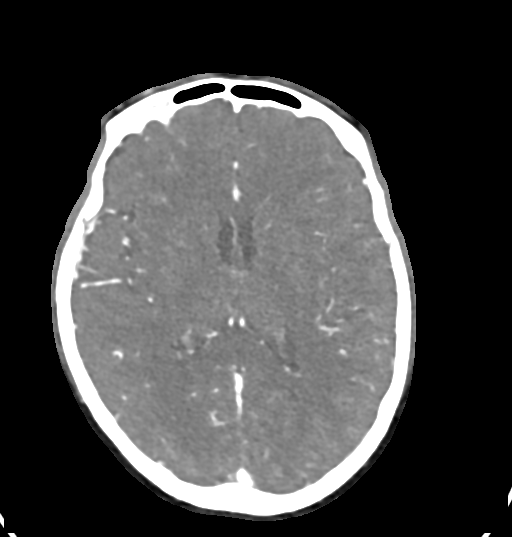

[3 of 33 positions shown; findings below may reference images not displayed]

FINDINGS: CT HEAD

Brain: Noncontrast images the brain demonstrate no acute infarct,
hemorrhage, or mass lesion. The ventricles are of normal size. The
basal ganglia and insular ribbon is intact bilaterally. No
significant extra-axial fluid collection is present.

Calvarium and skull base: Within normal limits.

Paranasal sinuses: Fluid levels are present in the maxillary sinuses
bilaterally. Mucosal thickening is evident throughout the ethmoid
air cells. A small fluid level is present in the left frontal sinus
and bilateral sphenoid sinuses. The mastoid air cells are clear.

Orbits: The globes and orbits are intact.

CTA NECK

Aortic arch: There is a common origin of the left common carotid
artery in the innominate artery. Focal calcification is present at
the origin of the left subclavian artery without a significant
stenosis. Atherosclerotic calcifications are present along the
undersurface of the aorta. There is no aneurysm or stenosis.

Right carotid system: Proximal right common carotid artery is within
normal limits. A right carotid endarterectomy is noted. There is
still fluid and gas in the right neck following surgery 3 days ago.
The endarterectomy is patent. The cervical right ICA above this
level is normal.

Left carotid system: The left common carotid artery is within normal
limits. Atherosclerotic changes are present at the cervical left
ICA. The lumen is narrowed to 2 mm. This compares with a more distal
lumen of 4 mm. The distal left ICA slightly smaller than the right.

Vertebral arteries:The vertebral arteries originate from the
subclavian arteries bilaterally. There is no focal stenosis within
either vertebral artery in the neck. The left vertebral artery is
slightly dominant to the right.

Skeleton: Endplate degenerative changes are most evident at C4-5 on
the right and bilaterally at C5-6 and C6-7. Uncovertebral spurring
an osseous foraminal narrowing is most evident on the left at C5-6
and C6-7.

Other neck: The soft tissues the neck are remarkable for recent
surgery on the right. No significant adenopathy is present. There
remains some mass effect on the airway with displacement to left but
no airway compromise. The salivary glands are within normal limits.
The thyroid is unremarkable. The lung apices demonstrate mild
centrilobular emphysema.

CTA HEAD

Anterior circulation: Atherosclerotic calcifications are present
within the cavernous internal carotid arteries bilaterally. There is
no focal stenosis through the ICA termini. The A1 and M1 segments
are normal. The left A1 segment is dominant. The anterior
communicating artery is patent. The MCA bifurcations are intact
bilaterally. Perfusion of the right MCA branch vessels is more
robust than on the left. ACA branch vessels are normal bilaterally.

Posterior circulation: The left vertebral artery is the dominant
vessel. The PICA origins are visualized and normal bilaterally. The
basilar artery is small. The left posterior cerebral artery
originates from the basilar tip. The right posterior cerebral artery
is of fetal type. The PCA branch vessels are within normal limits.

Venous sinuses: The dural sinuses are patent. The left transverse
sinus is dominant. The straight sinus and deep cerebral veins are
patent. The cortical veins are unremarkable.

Anatomic variants: Fetal type right posterior cerebral artery.

Delayed phase: The postcontrast images demonstrate no pathologic
enhancement.
IMPRESSION: 1. Right carotid endarterectomy is intact.
2. Increased conspicuity of right MCA branch vessels compared to the
left. This suggests right MCA territory hyperperfusion following
endarterectomy.
3. No focal occlusion or stenosis.
4. 50% stenosis of the left carotid bifurcation.
5. The posterior circulation is intact.
6. Mild degenerative changes of the cervical spine.
7. Emphysema.
8. Fluid levels within the paranasal sinuses compatible with
sinusitis.

## 2017-04-21 ENCOUNTER — Other Ambulatory Visit (INDEPENDENT_AMBULATORY_CARE_PROVIDER_SITE_OTHER): Payer: Self-pay | Admitting: Specialist

## 2017-04-21 DIAGNOSIS — M48062 Spinal stenosis, lumbar region with neurogenic claudication: Secondary | ICD-10-CM

## 2017-04-21 DIAGNOSIS — M1611 Unilateral primary osteoarthritis, right hip: Secondary | ICD-10-CM

## 2017-04-21 DIAGNOSIS — M51369 Other intervertebral disc degeneration, lumbar region without mention of lumbar back pain or lower extremity pain: Secondary | ICD-10-CM

## 2017-04-21 DIAGNOSIS — M5136 Other intervertebral disc degeneration, lumbar region: Secondary | ICD-10-CM

## 2017-04-21 NOTE — Telephone Encounter (Signed)
Patient called asking for a refill on her Tylenol with codeine. CB #  [phone]

## 2017-04-22 NOTE — Addendum Note (Signed)
Addended by: Penne LashSHUE WILLS, Neysa BonitoHRISTY N on: 04/22/2017 05:09 PM   Modules accepted: Orders

## 2017-04-23 MED ORDER — ACETAMINOPHEN-CODEINE #3 300-30 MG PO TABS: 1.0000 | ORAL_TABLET | Freq: Three times a day (TID) | ORAL | 0 refills | Status: DC | PRN

## 2017-04-23 NOTE — Telephone Encounter (Signed)
I called rx into her pharmacy I called and advised patient also on this.

## 2017-05-12 ENCOUNTER — Other Ambulatory Visit (INDEPENDENT_AMBULATORY_CARE_PROVIDER_SITE_OTHER): Payer: Self-pay | Admitting: Specialist

## 2017-05-12 DIAGNOSIS — M1611 Unilateral primary osteoarthritis, right hip: Secondary | ICD-10-CM

## 2017-05-12 DIAGNOSIS — M48062 Spinal stenosis, lumbar region with neurogenic claudication: Secondary | ICD-10-CM

## 2017-05-12 DIAGNOSIS — M5136 Other intervertebral disc degeneration, lumbar region: Secondary | ICD-10-CM

## 2017-05-12 NOTE — Telephone Encounter (Signed)
Tylenol # 3 Refill request 

## 2017-05-13 NOTE — Telephone Encounter (Signed)
[facility] [place]

## 2017-05-18 ENCOUNTER — Other Ambulatory Visit (INDEPENDENT_AMBULATORY_CARE_PROVIDER_SITE_OTHER): Payer: Self-pay | Admitting: Specialist

## 2017-05-19 NOTE — Telephone Encounter (Signed)
meloxicam refill request 

## 2017-05-21 ENCOUNTER — Ambulatory Visit (INDEPENDENT_AMBULATORY_CARE_PROVIDER_SITE_OTHER): Payer: Medicare Other | Admitting: Surgery

## 2017-05-21 ENCOUNTER — Encounter (HOSPITAL_COMMUNITY)
Admission: RE | Admit: 2017-05-21 | Discharge: 2017-05-21 | Disposition: A | Discharge status: Home / Self Care | Payer: Medicare Other | Source: Ambulatory Visit | Attending: Specialist | Admitting: Specialist

## 2017-05-21 ENCOUNTER — Encounter (INDEPENDENT_AMBULATORY_CARE_PROVIDER_SITE_OTHER): Payer: Self-pay | Admitting: Surgery

## 2017-05-21 ENCOUNTER — Other Ambulatory Visit (INDEPENDENT_AMBULATORY_CARE_PROVIDER_SITE_OTHER): Payer: Self-pay | Admitting: Specialist

## 2017-05-21 ENCOUNTER — Other Ambulatory Visit: Payer: Self-pay

## 2017-05-21 ENCOUNTER — Encounter (HOSPITAL_COMMUNITY): Payer: Self-pay

## 2017-05-21 VITALS — BP 147/58 | HR 55 | Ht 65.0 in | Wt 155.0 lb

## 2017-05-21 DIAGNOSIS — Z01812 Encounter for preprocedural laboratory examination: Secondary | ICD-10-CM | POA: Diagnosis present

## 2017-05-21 DIAGNOSIS — M48062 Spinal stenosis, lumbar region with neurogenic claudication: Secondary | ICD-10-CM

## 2017-05-21 HISTORY — DX: Spinal stenosis, lumbar region without neurogenic claudication: M48.061

## 2017-05-21 LAB — COMPREHENSIVE METABOLIC PANEL
ALBUMIN: 4 g/dL (ref 3.5–5.0)
ALK PHOS: 88 U/L (ref 38–126)
ALT: 32 U/L (ref 14–54)
AST: 26 U/L (ref 15–41)
Anion gap: 7 (ref 5–15)
BILIRUBIN TOTAL: 0.7 mg/dL (ref 0.3–1.2)
BUN: 16 mg/dL (ref 6–20)
CALCIUM: 9.7 mg/dL (ref 8.9–10.3)
CO2: 24 mmol/L (ref 22–32)
CREATININE: 0.85 mg/dL (ref 0.44–1.00)
Chloride: 104 mmol/L (ref 101–111)
GFR calc Af Amer: >60 mL/min (ref >60)
GFR calc non Af Amer: >60 mL/min (ref >60)
GLUCOSE: 110 mg/dL — AB (ref 65–99)
Potassium: 4.6 mmol/L (ref 3.5–5.1)
Sodium: 135 mmol/L (ref 135–145)
Total Protein: 6.4 g/dL — ABNORMAL LOW (ref 6.5–8.1)

## 2017-05-21 LAB — CBC
HEMATOCRIT: 39.5 % (ref 36.0–46.0)
HEMOGLOBIN: 13 g/dL (ref 12.0–15.0)
MCH: 30.7 pg (ref 26.0–34.0)
MCHC: 32.9 g/dL (ref 30.0–36.0)
MCV: 93.2 fL (ref 78.0–100.0)
Platelets: 259 10*3/uL (ref 150–400)
RBC: 4.24 MIL/uL (ref 3.87–5.11)
RDW: 13.1 % (ref 11.5–15.5)
WBC: 8.7 10*3/uL (ref 4.0–10.5)

## 2017-05-21 LAB — SURGICAL PCR SCREEN
MRSA, PCR: NEGATIVE
Staphylococcus aureus: POSITIVE — AB

## 2017-05-21 NOTE — Progress Notes (Signed)
Pt denies any acute cardiopulmonary issues. Pt under the care of Dr. Elease HashimotoNahser, Cardiology. Pt denies having a cardiac cath. Pt denies recent labs. Pt chart forwarded to anesthesia to review cardiac clearance note in Epic.

## 2017-05-21 NOTE — H&P (Addendum)
Heather [name] is an 72 y.o. female.   Chief Complaint: Low back pain and bilateral lower extremity radiculopathy HPI: Patient with history of L3-4 and L4-5 spinal stenosis and the above complaint presents for preop evaluation.  progressively worsening symptoms.  Failed conservative treatment.  Past Medical History:  Diagnosis Date  . Anxiety   . Arthritis    "knees, right hip, lumbar back" (12/17/2016)  . Chicken pox   . Chronic lower back pain   . DDD (degenerative disc disease), lumbar   . Depression   . Headache 06/23/2015   "2d after carotid OR & again today" (12/17/2016)  . Heart murmur    MVP  . High cholesterol   . History of blood transfusion 1975   "related to femur  fracture" (12/17/2016)  . HOH (hard of hearing)    left  . Hypertension   . MVP (mitral valve prolapse)    echo 1986-mild  . 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 Shortness of breath dyspnea    WITH EXERTION    . Stroke [facility]) 04/2015   "light stroke; sometimes my thinking is slow since" (12/17/2016)  . Wears glasses     Past Surgical History:  Procedure Laterality Date  . CARPAL TUNNEL RELEASE Right 03/31/2013   Procedure: RIGHT CARPAL TUNNEL RELEASE;  Surgeon: Nicki Reaper, MD;  Location: [facility];  Service: Orthopedics;  Laterality: Right;  . ENDARTERECTOMY Right 05/03/2015   Procedure: RIGHT CAROTID ENDARTERECTOMY ;  Surgeon: Fransisco Hertz, MD;  Location: [facility] OR;  Service: Vascular;  Laterality: Right;  . FEMUR FRACTURE SURGERY Right 1975   "hip"  . FRACTURE SURGERY    . NASAL SINUS SURGERY  1975  . PLANTAR FASCIA RELEASE Bilateral 1968  . SHOULDER OPEN ROTATOR CUFF REPAIR Right 1998  . TONSILLECTOMY  1951  . TUBAL LIGATION  1995    Family History  Adopted: Yes  Problem Relation Age of Onset  . Osteoporosis Mother   . Heart disease Mother   . Hypertension Mother   . Hyperlipidemia Mother    . Stroke Mother   . Arthritis Mother   . Diabetes Father   . Heart attack Father   . Cancer Sister        breast  . Colon cancer Neg Hx    Social History:  reports that she has never smoked. She has never used smokeless tobacco. She reports that she drinks about 1.8 oz of alcohol per week. She reports that she does not use drugs.  Allergies:  Allergies  Allergen Reactions  . Sulfamethoxazole Swelling and Other (See Comments)    Unspecified area of swelling    No medications prior to admission.    No results found for this or any previous visit (from the past 48 hour(s)). No results found.  Review of Systems  Constitutional: Negative.   HENT: Negative.   Respiratory: Negative.   Cardiovascular: Negative.   Gastrointestinal: Negative.   Genitourinary: Negative.        Describe symptoms of urge incontinence.  No dysuria or hematuria  Musculoskeletal: Positive for back pain.  Neurological: Positive for tingling.  Psychiatric/Behavioral: Negative.     Last menstrual period 01/08/2012. Physical Exam  Constitutional: She is oriented to person, place, and time. No distress.  HENT:  Head: Normocephalic.  Eyes: Pupils are equal, round, and reactive to light.  Cardiovascular: Normal rate.  Respiratory: Effort normal and  breath sounds normal. No respiratory distress.  GI: Soft. Bowel sounds are normal. She exhibits no distension. There is no tenderness.  Musculoskeletal:  Negative logroll.  Negative straight leg raise.  Tender over the bilateral SI joints.  Neurological: She is alert and oriented to person, place, and time.  Skin: Skin is warm and dry.  Psychiatric: She has a normal mood and affect.     Assessment/Plan L3-4 and L4-5 stenosis with low back pain and bilateral lower extremity radiculopathy.  We will proceed with bilateral partial hemilaminectomies L3-4 and L4-5.  Surgery procedure along with possible rehab/recovery time discussed.  All questions answered and  wishes to proceed.  Zonia KiefJames Owens, PA-C 05/21/2017, 9:53 AM

## 2017-05-21 NOTE — Pre-Procedure Instructions (Signed)
    [facility]  [facility]      [id number] [name] - [address] [name] [id number] Phone: [phone] Fax: [phone]   Your procedure is scheduled on Monday, May 26, 2017  [name] Admitting at 5:30 A.M.  Call this number if you have problems the morning of surgery:  [phone]  Remember: Follow your doctor's instructions regarding Aspirin  Do not eat food or drink liquids after midnight Sunday, May 25, 2017  Take these medicines the morning of surgery with A SIP OF WATER : busPIRone (BUSPAR),  loratadine (CLARITIN) , metoprolol tartrate (LOPRESSOR),  sertraline (ZOLOFT), if needed: acetaminophen-codeine (TYLENOL #3) for pain  Stop taking vitamins, fish oil and herbal medications. Do not take any NSAIDs ie: Ibuprofen, Advil, Naproxen (Aleve),  Motrin, BC and Goody Powder; stop now.   Do not wear jewelry, make-up or nail polish.  Do not wear lotions, powders, or perfumes, or deodorant.  Do not shave 48 hours prior to surgery.    Do not bring valuables to the hospital.  [facility] is not responsible for any belongings or valuables.  Contacts, dentures or bridgework may not be worn into surgery.  Leave your suitcase in the car.  After surgery it may be brought to your room. Patients discharged the day of surgery will not be allowed to drive home.  Special instructions: See " [facility]- Preparing For Surgery" sheet Please read over the following fact sheets that you were given. Pain Booklet, Coughing and Deep Breathing, MRSA Information and Surgical Site Infection Prevention

## 2017-05-21 NOTE — Progress Notes (Signed)
72 year old white female comes in today for preop H&P for bilateral partial hemilaminectomies L3-4 and L4-5.  Today full H&P was placed in patient's chart.  We received preop cardiac clearance.  Patient states that she has been having some chronic issues with possible urge incontinence but states that she has not discussed this with her primary care physician.  She does not have a urologist.  I did encourage her to speak with PCP to get this evaluated. Patient also having pain over the bilateral SI joints and we did discuss possible injections sometime postop.

## 2017-05-22 NOTE — Progress Notes (Signed)
Anesthesia Chart Review:  Case:  [id number] Date/Time:  05/26/17 0715   Procedure:  BILATERAL PARTIAL HEMILAMINECTOMIES L3-4 AND L4-5 (N/A )   Anesthesia type:  General   Pre-op diagnosis:  L3-4 and L4-5 lumbar spinal stenosis   [facility] / [facility]   Surgeon:  Kerrin ChampagneNitka, James E, MD     DISCUSSION: Patient is a 72 year old female scheduled for the above procedure. He had initially been scheduled for lumbar laminectomy in 2017, but was found to have severe RICA stenosis and instead underwent right CEA 05/03/15. 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 Stephanie Acreharles Willis). Other history include HTN, MVP, never smoker.   She was felt acceptable risk for this surgery per cardiology. If no acute changes then I anticipate that she can proceed as planned.  VS: BP (!) 162/52   Pulse (!) 51   Temp 36.7 C   Resp 20   Ht 5\' 5"  (1.651 m)   Wt 155 lb (70.3 kg)   LMP 01/08/2012   SpO2 97%   BMI 25.79 kg/m   PROVIDERS: [name], Regina W, NP as PCP Patient Care Team: Fransisco Hertzhen, Brian L, MD as Consulting Physician (Vascular Surgery) Kerrin ChampagneNitka, James E, MD as Consulting Physician (Orthopedic Surgery) Leodis SiasNahser, Phillip, MD as Cardiology. Last visit 02/04/17. Per telephone encounter by Corine ShelterLuke Kilroy, PA-c on 04/03/17, "..Therefore, based on ACC/AHA guidelines, the patient would be at acceptable risk for the planned procedure without further cardiovascular testing."    LABS: Labs reviewed: Acceptable for surgery. (all labs ordered are listed, but only abnormal results are displayed)  Labs Reviewed  SURGICAL PCR SCREEN - Abnormal; Notable for the following components:      Result Value   Staphylococcus aureus POSITIVE (*)    All other components within normal limits  COMPREHENSIVE METABOLIC PANEL - Abnormal; Notable for the following components:   Glucose, Bld 110 (*)    Total Protein 6.4 (*)    All other  components within normal limits  CBC    IMAGES: CXR 12/26/16: IMPRESSION: No active cardiopulmonary disease.   EKG: 12/16/16: Normal sinus rhythm, nonspecific ST abnormality.  CV:  Echo 12/18/16: Study Conclusions - Left ventricle: The cavity size was normal. Systolic function was   normal. The estimated ejection fraction was in the range of 60%   to 65%. Wall motion was normal; there were no regional wall   motion abnormalities. Doppler parameters are consistent with both   elevated ventricular end-diastolic filling pressure and elevated   left atrial filling pressure. - Aortic valve: small gradient across aortic valve but valve opens   well on 2D imaging Valve area (VTI): 1.78 cm^2. Valve area   (Vmax): 1.69 cm^2. Valve area (Vmean): 1.62 cm^2. - Mitral valve: Valve area by pressure half-time: 2.47 cm^2. - Left atrium: moderately dilated by apical volume. The atrium was   moderately dilated. - Atrial septum: No defect or patent foramen ovale was identified.  Nuclear stress test 03/15/15:  Nuclear stress EF: 58%.  There was no ST segment deviation noted during stress.  The study is normal.  This is a low risk study.  The left ventricular ejection fraction is normal (55-65%). Normal pharmacologic nuclear study with no evidence for infarct or ischemia.  Carotid U/S 02/21/17: Final Interpretation: - Right Carotid: Velocities in the right ICA are consistent with a 1-39% stenosis, with a patent endarterectomy site. - Left Carotid: Velocities in the left ICA  are consistent with a 1-39% stenosis.  Past Medical History:  Diagnosis Date  . Anxiety   . Arthritis    "knees, right hip, lumbar back" (12/17/2016)  . Chicken pox   . Chronic lower back pain   . DDD (degenerative disc disease), lumbar   . Depression   . Headache 06/23/2015   "2d after carotid OR & again today" (12/17/2016)  . Heart murmur    MVP  . High cholesterol   . History of blood transfusion 1975    "related to femur  fracture" (12/17/2016)  . HOH (hard of hearing)    left  . Hypertension   . MVP (mitral valve prolapse)    echo 1986-mild  . 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 Shortness of breath dyspnea    WITH EXERTION    . Spinal stenosis of lumbar region   . Stroke [facility]) 04/2015   "light stroke; sometimes my thinking is slow since" (12/17/2016)  . Wears glasses   . Wears glasses     Past Surgical History:  Procedure Laterality Date  . CARPAL TUNNEL RELEASE Right 03/31/2013   Procedure: RIGHT CARPAL TUNNEL RELEASE;  Surgeon: Nicki Reaper, MD;  Location: [facility];  Service: Orthopedics;  Laterality: Right;  . ENDARTERECTOMY Right 05/03/2015   Procedure: RIGHT CAROTID ENDARTERECTOMY ;  Surgeon: Fransisco Hertz, MD;  Location[facility] OR;  Service: Vascular;  Laterality: Right;  . FEMUR FRACTURE SURGERY Right 1975   "hip"  . FRACTURE SURGERY    . NASAL SINUS SURGERY  1975  . PLANTAR FASCIA RELEASE Bilateral 1968  . SHOULDER OPEN ROTATOR CUFF REPAIR Right 1998  . TONSILLECTOMY  1951  . TUBAL LIGATION  1995    MEDICATIONS: . acetaminophen-codeine (TYLENOL #3) 300-30 MG tablet  . Ascorbic Acid (VITAMIN C) 1000 MG tablet  . aspirin EC 81 MG tablet  . atorvastatin (LIPITOR) 40 MG tablet  . busPIRone (BUSPAR) 7.5 MG tablet  . Cholecalciferol (VITAMIN D3) 1000 UNITS CAPS  . lidocaine (XYLOCAINE) 2 % jelly  . loratadine (CLARITIN) 10 MG tablet  . losartan (COZAAR) 100 MG tablet  . metoprolol tartrate (LOPRESSOR) 25 MG tablet  . Multiple Vitamin (MULTIVITAMIN) tablet  . sertraline (ZOLOFT) 50 MG tablet  . valACYclovir (VALTREX) 500 MG tablet   No current facility-administered medications for this encounter.   Instructed to follows surgeon's instructions regarding ASA (last dose 05/21/17).   Velna [facility] Short Stay Center/Anesthesiology Phone [phone] 05/22/2017 11:19 AM

## 2017-05-26 ENCOUNTER — Encounter (HOSPITAL_COMMUNITY)
Admission: RE | Disposition: A | Discharge status: Home / Self Care | Payer: Self-pay | Source: Ambulatory Visit | Attending: Specialist

## 2017-05-26 ENCOUNTER — Inpatient Hospital Stay (HOSPITAL_COMMUNITY): Payer: Medicare Other | Admitting: Certified Registered"

## 2017-05-26 ENCOUNTER — Observation Stay (HOSPITAL_COMMUNITY)
Admission: RE | Admit: 2017-05-26 | Discharge: 2017-05-29 | Disposition: A | Discharge status: Home / Self Care | Payer: Medicare Other | Source: Ambulatory Visit | Attending: Specialist | Admitting: Specialist

## 2017-05-26 ENCOUNTER — Other Ambulatory Visit: Payer: Self-pay

## 2017-05-26 ENCOUNTER — Inpatient Hospital Stay (HOSPITAL_COMMUNITY): Payer: Medicare Other | Admitting: Vascular Surgery

## 2017-05-26 ENCOUNTER — Encounter (HOSPITAL_COMMUNITY): Payer: Self-pay | Admitting: *Deleted

## 2017-05-26 ENCOUNTER — Inpatient Hospital Stay (HOSPITAL_COMMUNITY): Payer: Medicare Other

## 2017-05-26 DIAGNOSIS — I959 Hypotension, unspecified: Secondary | ICD-10-CM | POA: Insufficient documentation

## 2017-05-26 DIAGNOSIS — G9782 Other postprocedural complications and disorders of nervous system: Secondary | ICD-10-CM | POA: Diagnosis not present

## 2017-05-26 DIAGNOSIS — F419 Anxiety disorder, unspecified: Secondary | ICD-10-CM | POA: Diagnosis not present

## 2017-05-26 DIAGNOSIS — Z7982 Long term (current) use of aspirin: Secondary | ICD-10-CM | POA: Insufficient documentation

## 2017-05-26 DIAGNOSIS — F329 Major depressive disorder, single episode, unspecified: Secondary | ICD-10-CM | POA: Insufficient documentation

## 2017-05-26 DIAGNOSIS — Z8673 Personal history of transient ischemic attack (TIA), and cerebral infarction without residual deficits: Secondary | ICD-10-CM | POA: Diagnosis not present

## 2017-05-26 DIAGNOSIS — E7800 Pure hypercholesterolemia, unspecified: Secondary | ICD-10-CM | POA: Diagnosis not present

## 2017-05-26 DIAGNOSIS — M48062 Spinal stenosis, lumbar region with neurogenic claudication: Principal | ICD-10-CM | POA: Insufficient documentation

## 2017-05-26 DIAGNOSIS — M5416 Radiculopathy, lumbar region: Secondary | ICD-10-CM | POA: Diagnosis not present

## 2017-05-26 DIAGNOSIS — I10 Essential (primary) hypertension: Secondary | ICD-10-CM | POA: Insufficient documentation

## 2017-05-26 DIAGNOSIS — Z9889 Other specified postprocedural states: Secondary | ICD-10-CM

## 2017-05-26 DIAGNOSIS — D500 Iron deficiency anemia secondary to blood loss (chronic): Secondary | ICD-10-CM | POA: Insufficient documentation

## 2017-05-26 DIAGNOSIS — Z79899 Other long term (current) drug therapy: Secondary | ICD-10-CM | POA: Insufficient documentation

## 2017-05-26 DIAGNOSIS — Z419 Encounter for procedure for purposes other than remedying health state, unspecified: Secondary | ICD-10-CM

## 2017-05-26 HISTORY — PX: LUMBAR LAMINECTOMY: SHX95

## 2017-05-26 SURGERY — MICRODISCECTOMY LUMBAR LAMINECTOMY
Anesthesia: General

## 2017-05-26 MED ORDER — POLYETHYLENE GLYCOL 3350 17 G PO PACK
17.0000 g | PACK | Freq: Every day | ORAL | Status: DC | PRN
  Filled 2017-05-26: qty 1

## 2017-05-26 MED ORDER — ROCURONIUM BROMIDE 10 MG/ML (PF) SYRINGE
PREFILLED_SYRINGE | INTRAVENOUS | Status: AC
  Filled 2017-05-26: qty 5

## 2017-05-26 MED ORDER — PROPOFOL 10 MG/ML IV BOLUS
INTRAVENOUS | Status: AC
  Filled 2017-05-26: qty 20

## 2017-05-26 MED ORDER — MENTHOL 3 MG MT LOZG: 1.0000 | LOZENGE | OROMUCOSAL | Status: DC | PRN

## 2017-05-26 MED ORDER — SODIUM CHLORIDE 0.9% FLUSH: 3.0000 mL | INTRAVENOUS | Status: DC | PRN

## 2017-05-26 MED ORDER — THROMBIN (RECOMBINANT) 20000 UNITS EX SOLR
CUTANEOUS | Status: AC
  Filled 2017-05-26: qty 20000

## 2017-05-26 MED ORDER — ACETAMINOPHEN 650 MG RE SUPP: 650.0000 mg | RECTAL | Status: DC | PRN

## 2017-05-26 MED ORDER — ATORVASTATIN CALCIUM 40 MG PO TABS
40.0000 mg | ORAL_TABLET | Freq: Every evening | ORAL | Status: DC
  Administered 2017-05-26 – 2017-05-28 (×3): 40 mg via ORAL
  Filled 2017-05-26 (×3): qty 1

## 2017-05-26 MED ORDER — SODIUM CHLORIDE 0.9 % IV SOLN: 250.0000 mL | INTRAVENOUS | Status: DC

## 2017-05-26 MED ORDER — SUFENTANIL CITRATE 50 MCG/ML IV SOLN
INTRAVENOUS | Status: AC
  Filled 2017-05-26: qty 1

## 2017-05-26 MED ORDER — LORATADINE 10 MG PO TABS
10.0000 mg | ORAL_TABLET | Freq: Every day | ORAL | Status: DC
  Administered 2017-05-27 – 2017-05-29 (×3): 10 mg via ORAL
  Filled 2017-05-26 (×3): qty 1

## 2017-05-26 MED ORDER — CHLORHEXIDINE GLUCONATE 4 % EX LIQD: 60.0000 mL | Freq: Once | CUTANEOUS | Status: DC

## 2017-05-26 MED ORDER — HYDROCODONE-ACETAMINOPHEN 10-325 MG PO TABS
ORAL_TABLET | ORAL | Status: AC
  Administered 2017-05-26: 2 via ORAL
  Filled 2017-05-26: qty 1

## 2017-05-26 MED ORDER — METHOCARBAMOL 500 MG PO TABS
ORAL_TABLET | ORAL | Status: AC
  Administered 2017-05-26: 500 mg via ORAL
  Filled 2017-05-26: qty 1

## 2017-05-26 MED ORDER — HYDROCODONE-ACETAMINOPHEN 10-325 MG PO TABS
2.0000 | ORAL_TABLET | ORAL | Status: DC | PRN
  Administered 2017-05-26 – 2017-05-28 (×5): 2 via ORAL
  Filled 2017-05-26 (×3): qty 2

## 2017-05-26 MED ORDER — FENTANYL CITRATE (PF) 100 MCG/2ML IJ SOLN
INTRAMUSCULAR | Status: AC
  Administered 2017-05-26: 50 ug via INTRAVENOUS
  Filled 2017-05-26: qty 2

## 2017-05-26 MED ORDER — CEFAZOLIN SODIUM-DEXTROSE 2-4 GM/100ML-% IV SOLN: 2.0000 g | INTRAVENOUS | Status: DC

## 2017-05-26 MED ORDER — DOCUSATE SODIUM 100 MG PO CAPS
100.0000 mg | ORAL_CAPSULE | Freq: Two times a day (BID) | ORAL | Status: DC
  Administered 2017-05-26 – 2017-05-29 (×6): 100 mg via ORAL
  Filled 2017-05-26 (×6): qty 1

## 2017-05-26 MED ORDER — BUSPIRONE HCL 15 MG PO TABS
7.5000 mg | ORAL_TABLET | Freq: Two times a day (BID) | ORAL | Status: DC
  Administered 2017-05-26 – 2017-05-29 (×6): 7.5 mg via ORAL
  Filled 2017-05-26: qty 2
  Filled 2017-05-26 (×3): qty 1
  Filled 2017-05-26 (×2): qty 2
  Filled 2017-05-26: qty 1
  Filled 2017-05-26: qty 2
  Filled 2017-05-26 (×2): qty 1
  Filled 2017-05-26 (×2): qty 2

## 2017-05-26 MED ORDER — FLEET ENEMA 7-19 GM/118ML RE ENEM: 1.0000 | ENEMA | Freq: Once | RECTAL | Status: DC | PRN

## 2017-05-26 MED ORDER — ONDANSETRON HCL 4 MG PO TABS: 4.0000 mg | ORAL_TABLET | Freq: Four times a day (QID) | ORAL | Status: DC | PRN

## 2017-05-26 MED ORDER — METHOCARBAMOL 500 MG PO TABS
500.0000 mg | ORAL_TABLET | Freq: Four times a day (QID) | ORAL | Status: DC | PRN
Start: 2017-05-26 — End: 2017-05-29
  Administered 2017-05-26 – 2017-05-28 (×5): 500 mg via ORAL
  Filled 2017-05-26 (×4): qty 1

## 2017-05-26 MED ORDER — BUPIVACAINE HCL (PF) 0.25 % IJ SOLN
INTRAMUSCULAR | Status: DC | PRN
  Administered 2017-05-26: 10 mL

## 2017-05-26 MED ORDER — PROPOFOL 10 MG/ML IV BOLUS
INTRAVENOUS | Status: DC | PRN
  Administered 2017-05-26: 150 mg via INTRAVENOUS

## 2017-05-26 MED ORDER — 0.9 % SODIUM CHLORIDE (POUR BTL) OPTIME
TOPICAL | Status: DC | PRN
  Administered 2017-05-26: 1000 mL

## 2017-05-26 MED ORDER — THROMBIN (RECOMBINANT) 20000 UNITS EX SOLR
CUTANEOUS | Status: DC | PRN
  Administered 2017-05-26: 20 mL via TOPICAL

## 2017-05-26 MED ORDER — ONDANSETRON HCL 4 MG/2ML IJ SOLN: 4.0000 mg | Freq: Four times a day (QID) | INTRAMUSCULAR | Status: DC | PRN

## 2017-05-26 MED ORDER — LACTATED RINGERS IV SOLN
INTRAVENOUS | Status: DC | PRN
  Administered 2017-05-26 (×2): via INTRAVENOUS

## 2017-05-26 MED ORDER — ALBUMIN HUMAN 5 % IV SOLN
INTRAVENOUS | Status: DC | PRN
  Administered 2017-05-26: 10:00:00 via INTRAVENOUS

## 2017-05-26 MED ORDER — MIDAZOLAM HCL 2 MG/2ML IJ SOLN
INTRAMUSCULAR | Status: AC
  Filled 2017-05-26: qty 2

## 2017-05-26 MED ORDER — FENTANYL CITRATE (PF) 100 MCG/2ML IJ SOLN
INTRAMUSCULAR | Status: AC
  Filled 2017-05-26: qty 2

## 2017-05-26 MED ORDER — ALUM & MAG HYDROXIDE-SIMETH 200-200-20 MG/5ML PO SUSP: 30.0000 mL | Freq: Four times a day (QID) | ORAL | Status: DC | PRN

## 2017-05-26 MED ORDER — MIDAZOLAM HCL 5 MG/5ML IJ SOLN
INTRAMUSCULAR | Status: DC | PRN
  Administered 2017-05-26: 2 mg via INTRAVENOUS

## 2017-05-26 MED ORDER — CEFAZOLIN SODIUM 1 G IJ SOLR
INTRAMUSCULAR | Status: AC
  Filled 2017-05-26: qty 20

## 2017-05-26 MED ORDER — METOCLOPRAMIDE HCL 5 MG/ML IJ SOLN: 10.0000 mg | Freq: Once | INTRAMUSCULAR | Status: DC | PRN

## 2017-05-26 MED ORDER — ASPIRIN EC 81 MG PO TBEC
81.0000 mg | DELAYED_RELEASE_TABLET | Freq: Every day | ORAL | Status: DC
  Administered 2017-05-26 – 2017-05-29 (×4): 81 mg via ORAL
  Filled 2017-05-26 (×4): qty 1

## 2017-05-26 MED ORDER — CEFAZOLIN SODIUM-DEXTROSE 2-4 GM/100ML-% IV SOLN
2.0000 g | INTRAVENOUS | Status: AC
  Administered 2017-05-26: 2 g via INTRAVENOUS
  Filled 2017-05-26: qty 100

## 2017-05-26 MED ORDER — VITAMIN D 1000 UNITS PO TABS
1000.0000 [IU] | ORAL_TABLET | Freq: Every day | ORAL | Status: DC
  Administered 2017-05-26 – 2017-05-29 (×4): 1000 [IU] via ORAL
  Filled 2017-05-26 (×4): qty 1

## 2017-05-26 MED ORDER — SODIUM CHLORIDE 0.9 % IJ SOLN
INTRAMUSCULAR | Status: AC
  Filled 2017-05-26: qty 20

## 2017-05-26 MED ORDER — HYDROCODONE-ACETAMINOPHEN 10-325 MG PO TABS
1.0000 | ORAL_TABLET | ORAL | Status: DC | PRN
  Administered 2017-05-26 – 2017-05-27 (×2): 1 via ORAL
  Filled 2017-05-26 (×2): qty 1

## 2017-05-26 MED ORDER — LIDOCAINE 2% (20 MG/ML) 5 ML SYRINGE
INTRAMUSCULAR | Status: AC
  Filled 2017-05-26: qty 5

## 2017-05-26 MED ORDER — PANTOPRAZOLE SODIUM 20 MG PO TBEC
20.0000 mg | DELAYED_RELEASE_TABLET | Freq: Two times a day (BID) | ORAL | Status: DC
Start: 2017-05-26 — End: 2017-05-29
  Administered 2017-05-26 – 2017-05-29 (×6): 20 mg via ORAL
  Filled 2017-05-26 (×6): qty 1

## 2017-05-26 MED ORDER — ACETAMINOPHEN 325 MG PO TABS: 650.0000 mg | ORAL_TABLET | ORAL | Status: DC | PRN

## 2017-05-26 MED ORDER — BUPIVACAINE HCL (PF) 0.25 % IJ SOLN
INTRAMUSCULAR | Status: AC
  Filled 2017-05-26: qty 30

## 2017-05-26 MED ORDER — LIDOCAINE 2% (20 MG/ML) 5 ML SYRINGE
INTRAMUSCULAR | Status: DC | PRN
  Administered 2017-05-26: 100 mg via INTRAVENOUS

## 2017-05-26 MED ORDER — ROCURONIUM BROMIDE 10 MG/ML (PF) SYRINGE
PREFILLED_SYRINGE | INTRAVENOUS | Status: DC | PRN
  Administered 2017-05-26: 50 mg via INTRAVENOUS

## 2017-05-26 MED ORDER — MEPERIDINE HCL 50 MG/ML IJ SOLN: 6.2500 mg | INTRAMUSCULAR | Status: DC | PRN

## 2017-05-26 MED ORDER — DEXAMETHASONE SODIUM PHOSPHATE 10 MG/ML IJ SOLN
INTRAMUSCULAR | Status: DC | PRN
  Administered 2017-05-26: 5 mg via INTRAVENOUS

## 2017-05-26 MED ORDER — HYDROCODONE-ACETAMINOPHEN 7.5-325 MG PO TABS
1.0000 | ORAL_TABLET | Freq: Four times a day (QID) | ORAL | Status: DC
  Administered 2017-05-26 – 2017-05-29 (×11): 1 via ORAL
  Filled 2017-05-26 (×11): qty 1

## 2017-05-26 MED ORDER — BUPIVACAINE LIPOSOME 1.3 % IJ SUSP
20.0000 mL | INTRAMUSCULAR | Status: AC
  Administered 2017-05-26: 10 mL
  Filled 2017-05-26: qty 20

## 2017-05-26 MED ORDER — SERTRALINE HCL 100 MG PO TABS
100.0000 mg | ORAL_TABLET | Freq: Every day | ORAL | Status: DC
  Administered 2017-05-27 – 2017-05-29 (×3): 100 mg via ORAL
  Filled 2017-05-26 (×3): qty 1

## 2017-05-26 MED ORDER — HYDROCODONE-ACETAMINOPHEN 10-325 MG PO TABS
ORAL_TABLET | ORAL | Status: AC
  Filled 2017-05-26: qty 1

## 2017-05-26 MED ORDER — SODIUM CHLORIDE 0.9% FLUSH
3.0000 mL | Freq: Two times a day (BID) | INTRAVENOUS | Status: DC
  Administered 2017-05-26 – 2017-05-29 (×5): 3 mL via INTRAVENOUS

## 2017-05-26 MED ORDER — GLYCOPYRROLATE 0.2 MG/ML IV SOSY
PREFILLED_SYRINGE | INTRAVENOUS | Status: DC | PRN
  Administered 2017-05-26: .2 mg via INTRAVENOUS

## 2017-05-26 MED ORDER — EPHEDRINE SULFATE-NACL 50-0.9 MG/10ML-% IV SOSY
PREFILLED_SYRINGE | INTRAVENOUS | Status: DC | PRN
  Administered 2017-05-26 (×3): 10 mg via INTRAVENOUS

## 2017-05-26 MED ORDER — FENTANYL CITRATE (PF) 100 MCG/2ML IJ SOLN
25.0000 ug | INTRAMUSCULAR | Status: DC | PRN
  Administered 2017-05-26 (×3): 50 ug via INTRAVENOUS

## 2017-05-26 MED ORDER — LOSARTAN POTASSIUM 50 MG PO TABS
100.0000 mg | ORAL_TABLET | Freq: Every day | ORAL | Status: DC
  Administered 2017-05-26 – 2017-05-29 (×4): 100 mg via ORAL
  Filled 2017-05-26 (×4): qty 2

## 2017-05-26 MED ORDER — LIDOCAINE HCL 2 % EX GEL: 1.0000 "application " | Freq: Two times a day (BID) | CUTANEOUS | Status: DC | PRN

## 2017-05-26 MED ORDER — METOPROLOL TARTRATE 25 MG PO TABS
25.0000 mg | ORAL_TABLET | Freq: Two times a day (BID) | ORAL | Status: DC
  Administered 2017-05-26 – 2017-05-27 (×2): 25 mg via ORAL
  Filled 2017-05-26 (×2): qty 1

## 2017-05-26 MED ORDER — MORPHINE SULFATE (PF) 2 MG/ML IV SOLN: 1.0000 mg | INTRAVENOUS | Status: DC | PRN

## 2017-05-26 MED ORDER — METHOCARBAMOL 1000 MG/10ML IJ SOLN
500.0000 mg | Freq: Four times a day (QID) | INTRAVENOUS | Status: DC | PRN
  Filled 2017-05-26: qty 5

## 2017-05-26 MED ORDER — PHENOL 1.4 % MT LIQD: 1.0000 | OROMUCOSAL | Status: DC | PRN

## 2017-05-26 MED ORDER — ADULT MULTIVITAMIN W/MINERALS CH
1.0000 | ORAL_TABLET | Freq: Every day | ORAL | Status: DC
  Administered 2017-05-26 – 2017-05-29 (×4): 1 via ORAL
  Filled 2017-05-26 (×4): qty 1

## 2017-05-26 MED ORDER — ONDANSETRON HCL 4 MG/2ML IJ SOLN
INTRAMUSCULAR | Status: DC | PRN
  Administered 2017-05-26: 4 mg via INTRAVENOUS

## 2017-05-26 MED ORDER — CEFAZOLIN SODIUM-DEXTROSE 2-4 GM/100ML-% IV SOLN
2.0000 g | Freq: Three times a day (TID) | INTRAVENOUS | Status: AC
  Administered 2017-05-26 (×2): 2 g via INTRAVENOUS
  Filled 2017-05-26 (×2): qty 100

## 2017-05-26 MED ORDER — VITAMIN C 500 MG PO TABS
1000.0000 mg | ORAL_TABLET | Freq: Every day | ORAL | Status: DC
  Administered 2017-05-26 – 2017-05-29 (×4): 1000 mg via ORAL
  Filled 2017-05-26 (×4): qty 2

## 2017-05-26 MED ORDER — BISACODYL 5 MG PO TBEC: 5.0000 mg | DELAYED_RELEASE_TABLET | Freq: Every day | ORAL | Status: DC | PRN

## 2017-05-26 MED ORDER — SODIUM CHLORIDE 0.9 % IV SOLN
INTRAVENOUS | Status: DC
  Administered 2017-05-26: 18:00:00 via INTRAVENOUS

## 2017-05-26 MED ORDER — VALACYCLOVIR HCL 500 MG PO TABS
500.0000 mg | ORAL_TABLET | Freq: Every day | ORAL | Status: DC
  Administered 2017-05-26 – 2017-05-29 (×4): 500 mg via ORAL
  Filled 2017-05-26 (×4): qty 1

## 2017-05-26 MED ORDER — SUFENTANIL CITRATE 50 MCG/ML IV SOLN
INTRAVENOUS | Status: DC | PRN
  Administered 2017-05-26: 5 ug via INTRAVENOUS
  Administered 2017-05-26 (×2): 10 ug via INTRAVENOUS
  Administered 2017-05-26: 15 ug via INTRAVENOUS
  Administered 2017-05-26: 10 ug via INTRAVENOUS

## 2017-05-26 MED ORDER — GABAPENTIN 300 MG PO CAPS
300.0000 mg | ORAL_CAPSULE | Freq: Two times a day (BID) | ORAL | Status: DC
  Administered 2017-05-26 – 2017-05-29 (×6): 300 mg via ORAL
  Filled 2017-05-26 (×6): qty 1

## 2017-05-26 SURGICAL SUPPLY — 55 items
APL SKNCLS STERI-STRIP NONHPOA (GAUZE/BANDAGES/DRESSINGS) ×1
BENZOIN TINCTURE PRP APPL 2/3 (GAUZE/BANDAGES/DRESSINGS) ×3 IMPLANT
BUR SABER RD CUTTING 3.0 (BURR) ×2 IMPLANT
BUR SABER RD CUTTING 3.0MM (BURR) ×1
CANISTER SUCT 3000ML PPV (MISCELLANEOUS) ×3 IMPLANT
CLOSURE WOUND 1/2 X4 (GAUZE/BANDAGES/DRESSINGS) ×1
COVER MAYO STAND STRL (DRAPES) ×3 IMPLANT
COVER SURGICAL LIGHT HANDLE (MISCELLANEOUS) ×3 IMPLANT
DRAPE C-ARM 42X72 X-RAY (DRAPES) IMPLANT
DRAPE HALF SHEET 40X57 (DRAPES) IMPLANT
DRAPE MICROSCOPE LEICA (MISCELLANEOUS) ×3 IMPLANT
DRAPE SURG 17X23 STRL (DRAPES) ×12 IMPLANT
DRSG MEPILEX BORDER 4X4 (GAUZE/BANDAGES/DRESSINGS) ×3 IMPLANT
DRSG MEPILEX BORDER 4X8 (GAUZE/BANDAGES/DRESSINGS) IMPLANT
DURAPREP 26ML APPLICATOR (WOUND CARE) ×3 IMPLANT
DURASEAL SPINE SEALANT 3ML (MISCELLANEOUS) ×3 IMPLANT
ELECT BLADE 4.0 EZ CLEAN MEGAD (MISCELLANEOUS) ×3
ELECT CAUTERY BLADE 6.4 (BLADE) ×3 IMPLANT
ELECT REM PT RETURN 9FT ADLT (ELECTROSURGICAL) ×3
ELECTRODE BLDE 4.0 EZ CLN MEGD (MISCELLANEOUS) ×1 IMPLANT
ELECTRODE REM PT RTRN 9FT ADLT (ELECTROSURGICAL) ×1 IMPLANT
GLOVE BIO SURGEON STRL SZ8 (GLOVE) ×3 IMPLANT
GLOVE BIOGEL PI IND STRL 7.5 (GLOVE) ×1 IMPLANT
GLOVE BIOGEL PI IND STRL 8 (GLOVE) ×1 IMPLANT
GLOVE BIOGEL PI INDICATOR 7.5 (GLOVE) ×2
GLOVE BIOGEL PI INDICATOR 8 (GLOVE) ×2
GLOVE ECLIPSE 8.5 STRL (GLOVE) ×3 IMPLANT
GLOVE ORTHO TXT STRL SZ7.5 (GLOVE) ×3 IMPLANT
GLOVE SURG 8.5 LATEX PF (GLOVE) ×3 IMPLANT
GOWN STRL REUS W/ TWL LRG LVL3 (GOWN DISPOSABLE) ×1 IMPLANT
GOWN STRL REUS W/TWL 2XL LVL3 (GOWN DISPOSABLE) ×6 IMPLANT
GOWN STRL REUS W/TWL LRG LVL3 (GOWN DISPOSABLE) ×2
KIT BASIN OR (CUSTOM PROCEDURE TRAY) ×3 IMPLANT
KIT TURNOVER KIT B (KITS) ×3 IMPLANT
NEEDLE SPNL 18GX3.5 QUINCKE PK (NEEDLE) ×6 IMPLANT
NS IRRIG 1000ML POUR BTL (IV SOLUTION) ×3 IMPLANT
PACK LAMINECTOMY ORTHO (CUSTOM PROCEDURE TRAY) ×3 IMPLANT
PAD ARMBOARD 7.5X6 YLW CONV (MISCELLANEOUS) ×6 IMPLANT
PATTIES SURGICAL .5 X.5 (GAUZE/BANDAGES/DRESSINGS) ×3 IMPLANT
PATTIES SURGICAL .75X.75 (GAUZE/BANDAGES/DRESSINGS) IMPLANT
SPONGE LAP 4X18 X RAY DECT (DISPOSABLE) IMPLANT
SPONGE SURGIFOAM ABS GEL 100 (HEMOSTASIS) ×3 IMPLANT
STRIP CLOSURE SKIN 1/2X4 (GAUZE/BANDAGES/DRESSINGS) ×2 IMPLANT
SUT NURALON 4 0 TR CR/8 (SUTURE) ×3 IMPLANT
SUT VIC AB 1 CTX 36 (SUTURE) ×2
SUT VIC AB 1 CTX36XBRD ANBCTR (SUTURE) ×1 IMPLANT
SUT VIC AB 2-0 CT1 27 (SUTURE) ×2
SUT VIC AB 2-0 CT1 TAPERPNT 27 (SUTURE) ×1 IMPLANT
SUT VIC AB 3-0 X1 27 (SUTURE) ×3 IMPLANT
SUT VICRYL 0 UR6 27IN ABS (SUTURE) ×3 IMPLANT
SYR 20CC LL (SYRINGE) ×3 IMPLANT
SYR CONTROL 10ML LL (SYRINGE) ×3 IMPLANT
TOWEL OR 17X24 6PK STRL BLUE (TOWEL DISPOSABLE) ×3 IMPLANT
TOWEL OR 17X26 10 PK STRL BLUE (TOWEL DISPOSABLE) ×3 IMPLANT
WATER STERILE IRR 1000ML POUR (IV SOLUTION) ×3 IMPLANT

## 2017-05-26 NOTE — Brief Op Note (Addendum)
05/26/2017  11:27 AM  PATIENT:  Heather [name]  72 y.o. female  PRE-OPERATIVE DIAGNOSIS:  L3-4 and L4-5 lumbar spinal stenosis  POST-OPERATIVE DIAGNOSIS:  L3-4 and L4-5 lumbar spinal stenosis  PROCEDURE:  Procedure(s): BILATERAL PARTIAL HEMILAMINECTOMIES L3-4 AND L4-5 (N/A)  SURGEON:  Surgeon(s) and Role:    * Kerrin Champagne, MD - Primary  PHYSICIAN ASSISTANT: Zonia Kief, PA-C  ANESTHESIA:   local, general and Dr. Nadene Rubins  EBL: 150cc  COMPLICATION: Dural tear right L3-4 medial to L4 root 1-2 mm, no neural element injury.   BLOOD ADMINISTERED:none  DRAINS: Urinary Catheter (Foley)   LOCAL MEDICATIONS USED:  MARCAINE 0.5% 1:1 EXPAREL 1.3%   and Amount: 30 ml  SPECIMEN:  No Specimen  DISPOSITION OF SPECIMEN:  N/A  COUNTS:  YES  TOURNIQUET:  * No tourniquets in log *  DICTATION: .Dragon Dictation  PLAN OF CARE: Admit to inpatient   PATIENT DISPOSITION:  PACU - hemodynamically stable.   Delay start of Pharmacological VTE agent (>24hrs) due to surgical blood loss or risk of bleeding: yes

## 2017-05-26 NOTE — Transfer of Care (Signed)
Immediate Anesthesia Transfer of Care Note  Patient: Heather [name]  Procedure(s) Performed: BILATERAL PARTIAL HEMILAMINECTOMIES L3-4 AND L4-5 (N/A )  Patient Location: PACU  Anesthesia Type:General  Level of Consciousness: awake, oriented and patient cooperative  Airway & Oxygen Therapy: Patient Spontanous Breathing and Patient connected to nasal cannula oxygen  Post-op Assessment: Report given to RN, Post -op Vital signs reviewed and stable and Patient moving all extremities  Post vital signs: Reviewed and stable  Last Vitals:  Vitals Value Taken Time  BP 156/57 05/26/2017 11:55 AM  Temp    Pulse 73 05/26/2017 11:56 AM  Resp 13 05/26/2017 11:56 AM  SpO2 99 % 05/26/2017 11:56 AM  Vitals shown include unvalidated device data.  Last Pain:  Vitals:   05/26/17 0639  TempSrc:   PainSc: 0-No pain         Complications: No apparent anesthesia complications

## 2017-05-26 NOTE — Progress Notes (Signed)
This RN spoke to Dr. Magnus Ivan via telephone to clarify pt's mobility orders. Per Dr. Magnus Ivan, pt is to be HOB 10 degrees or less overnight until Dr. Otelia Sergeant reevaluates pt tomorrow morning 4/30 due to dural tear. Will continue to monitor.

## 2017-05-26 NOTE — Progress Notes (Signed)
Pt arrived to room 5N20 via bed. Pt AxO X4. No questions or concerns at this time. Mepilex dressing to back is clean, dry, and intact at this time. Received report from Ladona Ridgel, RN from PACU. See assessment. Will continue to monitor.

## 2017-05-26 NOTE — Op Note (Signed)
05/26/2017  11:41 AM  PATIENT:  Heather [name]  72 y.o. 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 Closure with 2 x 4-0 Neurolon sutures and duraseal.   DRAINS: Foley to SD.  EBL: 150 CC.    PROCEDURE:The patient was met in the holding area, and the appropriate lumbar level L3-4 and L4-5 identified and marked with "x" and my initials.The patient was then transported to OR and was placed under general anesthesia without difficulty. The patient received appropriate preoperative antibiotic prophylaxis ancef 1 gm. Foley catheter was placed sterilely. The patient after intubation atraumatically was transferred to the operating room table, prone position, Wilson frame, sliding OR table. All pressure points were well padded. The arms in 90-90 well-padded at the elbows. Standard prep with DuraPrep solution lower dorsal spine to the mid sacral segment. Draped in the usual manner iodine Vi-Drape was used. Time-out procedure was called and correct.  Skin incision made at the expected L4-5 level in line with a line transverse at the upper aspect of the iliac crest. This was then infiltrated with MARCAINE1/2% 1:1 EXPAREL 1.3% total of 20 cc used. An incision approximately an inch inch and a half in length was then made through skin and subcutaneous layers in line with the midline. An incision made into the right and left lumbosacral fascia approximately an inch and one half in length .  Cobb was then introduced into the incision site and  used to carefully form subperiosteal movement of the bilateral  paralumbar muscles off of the posterior lamina of the expected L3-4 and L4-5 level.down to and docking on the posterior aspect of the lamina at the expected L4-L5-S1 level.Cross table lateral with a Kocher placed at the L3-4 inter spinous process level and this was identified with a cross table lateral radiograph. Both the L3 and L4 spinous processes marked with OR marking pen. Using loope magnification the L4-5 interspace carefully debrided the small amount of muscle attachment here and high-speed bur used to drill the medial aspect of the inferior articular process bilaterally of L4 approximately 10%.  2 mm Kerrison then used to enter the spinal canal over the superior aspect of the L5 lamina carefully using the Kerrison to debris the attachment as a curet. Foraminotomy was then performed over the right S1 nerve root. The medial 10% superior articular process of L5 and then resected using an osteotome and 2 mm Kerrison. This allowed for identification of the thecal sac.Attention then turned to the left L4-5 level which was easily visualized with loop magnification and the MIS retractors. Soft tissues debrided about the left L4-5 area over the posterior aspect of the L4-5 interspace. High-speed bur and then used to carefully drill inferior 3 or 4 mm of the left side L4 lamina and on the medial aspect of the left L4 inferior articular process of 3 mm. The superior margin of the L5 lamina then carefully debrided with curette and 46m kerrison then used to enter the spinal canal over the superior aspect of  the L5 lamina resecting bone over the superior aspect and freeing up the attachment of ligamentum flavum here. Ligamentum flavum then debrided with the 3 mm Kerrison left L5 nerve root and the lateral recess decompressed using 2 and 3 mm Kerrisons sizing hypertrophic reflected ligamentum flavum extending superiorly. From was resected off the ventral  aspect of the inferior margin of the L4 lamina. Hockey-stick nerve probe could not be passed out the L4 neuroforamen at the L4-5. The left thecal sac at L4-5 was freed up with the L5 root identified and the thecal sac retracted with a Derricho retractor. The left L4-5 disc then exposed with a Penfield #4. The left L4-5 disc was protruded into the left L4-5 Neuroforamen. The disc incised sagittally with a 15 blade scapel. The disc then decompressed into the left  L4 neuroforamen [place] ing straight and down biting  pituitaruy ronguers and epstien curretts.   Venous bleeding encountered.With debridement the left L4 neuroforamen was decompressed and a Woodson probe was used to probe the L4 and L5 neuroforamen, kl Irrigation was carried out down to this bleeding controlled with Gelfoam. Gelfoam was then removed. Irrigation and careful examination demonstrated no active bleeding present. The Boss retractor then removed and replaced at the L3-4  level. Using loope magnification the leftt L3-4 interspace carefully debrided the small amount of muscle attachment here and high-speed bur used to drill the medial aspect of the inferior articular process of L3 bilateral approximately 20%.  2 mm Kerrison then used to enter the spinal canal over the superior aspect of the L4 lamina carefully using the Kerrison to debris the attachment as a curet. Foraminotomy was then performed over the L4 nerve root. The medial 10% superior articular process of L4 and then resected using 2 mm Kerrison. This allowed for identification of the thecal sac.The operating room microscope sterilely draped brought into the field. The left L4 nerve root was then carefully retracted medially using first a derricho retractor. Bleeding from epidural veins required bipolar electrocautery and Gelfoam with cottonoids. The no herniated disc noted on the left side at L3-4.  Care was taken to retract the L4 nerve root lateral thecal sac allowing for the disc to be  examined. A Woodson retractor was used to examine the spinal canal and the area posterior to the L3-4 disc space showed no significant disc protrusion. The L4 and L3 neuroforamen probe freely with no sign remaining nerve compression. Attention then turned to the right L3-4 level which was easily visualized with the OR microscope for magnification. Soft tissues debrided about the right L3-4 area over the posterior aspect of the L3-4 interspace. High-speed bur and then used to carefully drill inferior 3 or 4 mm of the right side L3 lamina and on the medial aspect of the right L3-4 inferior articular process of 3 mm. The superior margin of the L4 lamina then carefully debrided with curette but the inferior articular process of the right L3 lamina with adherent and confluent with the superior lamina of L4 so a hiigh speed bur was used to drill this area. The lower attachment of the ligamentum flavum on the right side was removed and a dural tear 1-2 mm in diameter occurred. No neural elements were herniated or injured. The hemilaminectomy was carried out protecting the dural tear with a small cottonoid. 16m kerrison then used to enter the spinal canal over the superior aspect of the L4 lamina resecting bone over the superior aspect and freeing up the attachment of ligamentum  flavum here. Ligamentum flavum then debrided with the 3 mm Kerrison right L4 nerve root and the lateral recess decompressed using 2 and 3 mm Kerrisons  And an osteotome excising hypertrophic reflected ligamentum flavum extending superiorly. Ligamentum flavum was resected off the ventral aspect of the inferior margin of the L3 lamina. Hockey-stick nerve probe could then be passed out the right L3 neuroforamen and the right L4 neuroforamen. Venous bleeding encountered. Thrombin-soaked Gelfoam used to control this following this then the sac and the right L4 nerve root were mobilized medially and the L4-5 disc examined and found not to be herniated.  The L4-5 disc space showed hard disc prominence.The right lateral recess decompression was carried out. Retractors were then carefully removed bleeding was then controlled using bipolar electrocautery. Small amount of bleeding within the soft tissue mass the laminotomy area was controlled using bipolar electrocautery. The small dural tear was then repaired with 2 4-0 Neurolon sutures and tested with valsalva to 30 mm Hg with no CSF leak.  The left L4-5 and L3-4 laminotomies were evaluated and all Gelfoam were then removed. No significant active bleeding present at the time of removal. Dural seal was then instilled into the right L3-4 laminotomy area sealing further the area of the dural teal central to the area of the L4 nerve root as it approached the left L4 neuroforamen.  All instruments sponge counts were correct traction system was then carefully removed and bipolar electrocautery of any small bleeders.Gelfoam foam was removed from the right and left sides and irrigation was performed. No active bleeding was present. Subcutaneous tissue and fascia further infiltrated with MARCAINE1/2% 1:1 EXPAREL 1.3%, total amount: 30 ml.Lumbodorsal fascia was then carefully approximated with interrupted 0 Vicryl sutures, UR 6 needle deep subcutaneous layers were approximated with interrupted 0 Vicryl sutures on UR 6 the appear subcutaneous layers approximated with interrupted 2-0 Vicryl sutures and the skin closed with a running subcutaneous stitch of 4-0 Vicryl. Dermabond was applied allowed to dry and then Mepilex bandage applied. Patient was then carefully returned to supine position on a stretcher, reactivated and extubated. He was then returned to recovery room in satisfactory condition.  Benjiman Core PA-C perform the duties of assistant surgeon during this case. He was present from the beginning of the case to the end of the case assisting in transfer the patient from his stretcher to the OR table and back to the  stretcher at the end of the case. Assisted in careful retraction and suction of the laminectomy site delicate neural structures operating under the operating room microscope. He performed closure of the incision from the fascia to the skin applying the dressing.    Basil Dess  05/26/2017, 11:41 AM

## 2017-05-26 NOTE — Anesthesia Postprocedure Evaluation (Signed)
Anesthesia Post Note  Patient: Heather [name]  Procedure(s) Performed: BILATERAL PARTIAL HEMILAMINECTOMIES L3-4 AND L4-5 (N/A )     Patient location during evaluation: PACU Anesthesia Type: General Level of consciousness: awake and alert Pain management: pain level controlled Vital Signs Assessment: post-procedure vital signs reviewed and stable Respiratory status: spontaneous breathing, nonlabored ventilation, respiratory function stable and patient connected to nasal cannula oxygen Cardiovascular status: blood pressure returned to baseline and stable Postop Assessment: no apparent nausea or vomiting Anesthetic complications: no    Last Vitals:  Vitals:   05/26/17 1355 05/26/17 1425  BP: 138/67 (!) 122/48  Pulse: 82 63  Resp: 14 15  Temp: 36.6 C   SpO2: 99% 99%    Last Pain:  Vitals:   05/26/17 1355  TempSrc:   PainSc: 4                  Phillips Grout

## 2017-05-26 NOTE — Anesthesia Preprocedure Evaluation (Addendum)
Anesthesia Evaluation  Patient identified by MRN, date of birth, ID band Patient awake    Reviewed: Allergy & Precautions, NPO status , Patient's Chart, lab work & pertinent test results  Airway Mallampati: III  TM Distance: <3 FB Neck ROM: Limited    Dental no notable dental hx. (+) Teeth Intact, Dental Advisory Given   Pulmonary neg pulmonary ROS,    Pulmonary exam normal breath sounds clear to auscultation       Cardiovascular hypertension, Pt. on medications negative cardio ROS Normal cardiovascular exam Rhythm:Regular Rate:Normal     Neuro/Psych negative neurological ROS  negative psych ROS   GI/Hepatic negative GI ROS, Neg liver ROS,   Endo/Other  negative endocrine ROS  Renal/GU negative Renal ROS  negative genitourinary   Musculoskeletal negative musculoskeletal ROS (+)   Abdominal   Peds negative pediatric ROS (+)  Hematology negative hematology ROS (+)   Anesthesia Other Findings   Reproductive/Obstetrics negative OB ROS                           Anesthesia Physical Anesthesia Plan  ASA: II  Anesthesia Plan: General   Post-op Pain Management:    Induction: Intravenous  PONV Risk Score and Plan: 3 and Ondansetron, Treatment may vary due to age or medical condition and Midazolam  Airway Management Planned: Oral ETT  Additional Equipment: None  Intra-op Plan:   Post-operative Plan: Extubation in OR  Informed Consent: I have reviewed the patients History and Physical, chart, labs and discussed the procedure including the risks, benefits and alternatives for the proposed anesthesia with the patient or authorized representative who has indicated his/her understanding and acceptance.   Dental advisory given  Plan Discussed with: CRNA, Anesthesiologist and Surgeon  Anesthesia Plan Comments:        Anesthesia Quick Evaluation

## 2017-05-26 NOTE — Interval H&P Note (Signed)
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 After consideration of risks, benefits and other options for treatment, the patient has consented to  Procedure(s): BILATERAL PARTIAL HEMILAMINECTOMIES L3-4 AND L4-5 (N/A) as a surgical intervention .  The patient's history has been reviewed, patient examined, no change in status, stable for surgery.  I have reviewed the patient's chart and labs.  Questions were answered to the patient's satisfaction.     [name]

## 2017-05-26 NOTE — Progress Notes (Signed)
PT Cancellation Note  Patient Details Name: Heather [name] MRN: [id number] DOB: 05-14-1945   Cancelled Treatment:    Reason Eval/Treat Not Completed: Medical issues which prohibited therapy  Per most recent note from RN: Patient to remain on bed rest protocol due to dural tear until surgeon reassess in AM. Will follow-up for comprehensive physical therapy examination once cleared for mobility.  Heather [name], PT, DPT  Heather [name] 05/26/2017, 6:25 PM

## 2017-05-26 NOTE — Anesthesia Procedure Notes (Signed)
Procedure Name: Intubation Date/Time: 05/26/2017 7:37 AM Performed by: Moshe Salisbury, CRNA Pre-anesthesia Checklist: Patient identified, Emergency Drugs available, Suction available and Patient being monitored Patient Re-evaluated:Patient Re-evaluated prior to induction Oxygen Delivery Method: Circle System Utilized Preoxygenation: Pre-oxygenation with 100% oxygen Induction Type: IV induction Ventilation: Mask ventilation without difficulty Laryngoscope Size: Mac and 3 Grade View: Grade III Tube type: Oral Tube size: 7.5 mm Number of attempts: 2 Airway Equipment and Method: Stylet Placement Confirmation: ETT inserted through vocal cords under direct vision,  positive ETCO2 and breath sounds checked- equal and bilateral Secured at: 20 cm Tube secured with: Tape Dental Injury: Teeth and Oropharynx as per pre-operative assessment  Difficulty Due To: Difficulty was anticipated and Difficult Airway- due to anterior larynx Future Recommendations: Recommend- induction with short-acting agent, and alternative techniques readily available

## 2017-05-27 ENCOUNTER — Encounter (HOSPITAL_COMMUNITY): Payer: Self-pay | Admitting: Specialist

## 2017-05-27 ENCOUNTER — Telehealth (INDEPENDENT_AMBULATORY_CARE_PROVIDER_SITE_OTHER): Payer: Self-pay

## 2017-05-27 DIAGNOSIS — D500 Iron deficiency anemia secondary to blood loss (chronic): Secondary | ICD-10-CM | POA: Diagnosis not present

## 2017-05-27 DIAGNOSIS — G9600 Cerebrospinal fluid leak, unspecified: Secondary | ICD-10-CM | POA: Diagnosis not present

## 2017-05-27 DIAGNOSIS — G9782 Other postprocedural complications and disorders of nervous system: Secondary | ICD-10-CM | POA: Diagnosis not present

## 2017-05-27 DIAGNOSIS — M48062 Spinal stenosis, lumbar region with neurogenic claudication: Secondary | ICD-10-CM | POA: Diagnosis not present

## 2017-05-27 LAB — CBC
HEMATOCRIT: 30.2 % — AB (ref 36.0–46.0)
HEMOGLOBIN: 10 g/dL — AB (ref 12.0–15.0)
MCH: 30.6 pg (ref 26.0–34.0)
MCHC: 33.1 g/dL (ref 30.0–36.0)
MCV: 92.4 fL (ref 78.0–100.0)
Platelets: 196 10*3/uL (ref 150–400)
RBC: 3.27 MIL/uL — ABNORMAL LOW (ref 3.87–5.11)
RDW: 13.1 % (ref 11.5–15.5)
WBC: 12.8 10*3/uL — AB (ref 4.0–10.5)

## 2017-05-27 LAB — CBC WITH DIFFERENTIAL/PLATELET
Basophils Absolute: 0 10*3/uL (ref 0.0–0.1)
Basophils Relative: 0 %
EOS ABS: 0.2 10*3/uL (ref 0.0–0.7)
Eosinophils Relative: 2 %
HCT: 30.4 % — ABNORMAL LOW (ref 36.0–46.0)
HEMOGLOBIN: 9.9 g/dL — AB (ref 12.0–15.0)
Lymphocytes Relative: 13 %
Lymphs Abs: 1.3 10*3/uL (ref 0.7–4.0)
MCH: 30.7 pg (ref 26.0–34.0)
MCHC: 32.6 g/dL (ref 30.0–36.0)
MCV: 94.4 fL (ref 78.0–100.0)
MONO ABS: 1.3 10*3/uL — AB (ref 0.1–1.0)
MONOS PCT: 12 %
NEUTROS PCT: 73 %
Neutro Abs: 7.7 10*3/uL (ref 1.7–7.7)
Platelets: 202 10*3/uL (ref 150–400)
RBC: 3.22 MIL/uL — ABNORMAL LOW (ref 3.87–5.11)
RDW: 13.4 % (ref 11.5–15.5)
WBC: 10.5 10*3/uL (ref 4.0–10.5)

## 2017-05-27 LAB — BASIC METABOLIC PANEL
ANION GAP: 7 (ref 5–15)
Anion gap: 6 (ref 5–15)
BUN: 11 mg/dL (ref 6–20)
BUN: 12 mg/dL (ref 6–20)
CHLORIDE: 105 mmol/L (ref 101–111)
CHLORIDE: 104 mmol/L (ref 101–111)
CO2: 27 mmol/L (ref 22–32)
CO2: 28 mmol/L (ref 22–32)
CREATININE: 1.06 mg/dL — AB (ref 0.44–1.00)
Calcium: 9.4 mg/dL (ref 8.9–10.3)
Calcium: 9.4 mg/dL (ref 8.9–10.3)
Creatinine, Ser: 0.79 mg/dL (ref 0.44–1.00)
GFR calc Af Amer: 59 mL/min — ABNORMAL LOW (ref >60)
GFR calc non Af Amer: >60 mL/min (ref >60)
GFR calc non Af Amer: 51 mL/min — ABNORMAL LOW (ref >60)
Glucose, Bld: 141 mg/dL — ABNORMAL HIGH (ref 65–99)
Glucose, Bld: 131 mg/dL — ABNORMAL HIGH (ref 65–99)
Potassium: 4.6 mmol/L (ref 3.5–5.1)
Potassium: 4.9 mmol/L (ref 3.5–5.1)
SODIUM: 138 mmol/L (ref 135–145)
Sodium: 139 mmol/L (ref 135–145)

## 2017-05-27 LAB — GLUCOSE, CAPILLARY: Glucose-Capillary: 123 mg/dL — ABNORMAL HIGH (ref 65–99)

## 2017-05-27 MED ORDER — HYDROCODONE-ACETAMINOPHEN 7.5-325 MG PO TABS: 1.0000 | ORAL_TABLET | Freq: Four times a day (QID) | ORAL | 0 refills | Status: DC

## 2017-05-27 MED ORDER — HYDROCORTISONE NA SUCCINATE PF 100 MG IJ SOLR
100.0000 mg | Freq: Three times a day (TID) | INTRAMUSCULAR | Status: AC
  Administered 2017-05-27 – 2017-05-28 (×2): 100 mg via INTRAVENOUS
  Filled 2017-05-27 (×2): qty 2

## 2017-05-27 MED ORDER — GABAPENTIN 300 MG PO CAPS: 300.0000 mg | ORAL_CAPSULE | Freq: Two times a day (BID) | ORAL | 0 refills | Status: DC

## 2017-05-27 MED ORDER — FERROUS GLUCONATE 324 (38 FE) MG PO TABS
324.0000 mg | ORAL_TABLET | Freq: Two times a day (BID) | ORAL | Status: DC
  Administered 2017-05-27 – 2017-05-29 (×4): 324 mg via ORAL
  Filled 2017-05-27 (×5): qty 1

## 2017-05-27 MED ORDER — DOCUSATE SODIUM 100 MG PO CAPS: 100.0000 mg | ORAL_CAPSULE | Freq: Two times a day (BID) | ORAL | 0 refills | Status: DC

## 2017-05-27 MED ORDER — FERROUS GLUCONATE 324 (38 FE) MG PO TABS: 324.0000 mg | ORAL_TABLET | Freq: Two times a day (BID) | ORAL | 0 refills | Status: DC

## 2017-05-27 NOTE — Progress Notes (Signed)
PT Cancellation Note  Patient Details Name: Heather [name] MRN: [id number] DOB: 12-Jan-1946   Cancelled Treatment:    Reason Eval/Treat Not Completed: Medical issues which prohibited therapy;Active bedrest order(Spoke with RN. No updates since verbal orders for patient to remain flat in bed. Awaiting revevalution from surgical team. ) Will continue to follow acutely, will evaluate once orders are updated and patient is clear for mobilization.    9:19 AM, 05/27/17 Rosamaria Lints, PT, DPT Physical Therapist - [place] [phone] (Pager)  [phone] (Office)      Cristal Qadir C 05/27/2017, 9:19 AM

## 2017-05-27 NOTE — Progress Notes (Signed)
     Subjective: 1 Day Post-Op Procedure(s) (LRB): BILATERAL PARTIAL HEMILAMINECTOMIES L3-4 AND L4-5 (N/A) Awake, alert and oriented x 4. No HA, no nausea or vomitting, does not like light directly above bed but tolerates light From window and indirect light. Foley to SD. IVF.    Patient reports pain as moderate.    Objective:   VITALS:  Temp:  [97.6 F (36.4 C)-98 F (36.7 C)] 98 F (36.7 C) (04/30 0432) Pulse Rate:  [62-82] 63 (04/30 1001) Resp:  [11-20] 16 (04/30 0432) BP: (117-156)/(47-71) 124/52 (04/30 1001) SpO2:  [95 %-100 %] 95 % (04/30 0432)  Neurologically intact ABD soft Neurovascular intact Sensation intact distally Intact pulses distally Dorsiflexion/Plantar flexion intact Incision: no drainage No cellulitis present   LABS Recent Labs    05/27/17 0315  HGB 10.0*  WBC 12.8*  PLT 196   Recent Labs    05/27/17 0315  NA 139  K 4.6  CL 105  CO2 27  BUN 11  CREATININE 0.79  GLUCOSE 141*   No results for input(s): LABPT, INR in the last 72 hours.   Assessment/Plan: 1 Day Post-Op Procedure(s) (LRB): BILATERAL PARTIAL HEMILAMINECTOMIES L3-4 AND L4-5 (N/A)  Advance diet Up with therapy D/C IV fluids  DIscontinue foley to SD PT today Start Ferrous gluconate for anemia due to periop blood loss. May be ready for discharge this afternoon but it depends on her ability to mobilize and her tolerance of the anemia.  [name] 05/27/2017, 10:04 AMPatient ID: Heather [name] Alert, female   DOB: [birth year], 72 y.o.   MRN: [id number]

## 2017-05-27 NOTE — Evaluation (Signed)
Physical Therapy Evaluation Patient Details Name: Heather [name] MRN: [id number] DOB: 1945-12-18 Today's Date: 05/27/2017   History of Present Illness  Heather [name] is a 71yo white female who comes to [facility] for lumbar laminectomy and dural repair after chronic Lumbar Spinal Stenosis. At baseline, pt is a community dwelling adult without AD, hx of transient shooting leg pain and plantar srural numbness.   Clinical Impression  Pt admitted with above diagnosis. Pt currently with functional limitations due to the deficits listed below (see "PT Problem List"). Upon entry, the patient is received semirecumbent in bed finishing lunch, MIL present. The pt is awake and agreeable to participate. No acute distress noted at this time. Pt educated on BLT precautions, and logroll for bed mobility. ModAssist required for bed mobility, minGuard assist to supervision for transfers and AMB with RW. Mild dizziness reported with position changes, but patient reports these to be related to pain medication. SpO2 initially 89% on RA but 95% ambulating in hall. Pt will benefit from skilled PT intervention to increase independence and safety with basic mobility in preparation for discharge to the venue listed below.      Follow Up Recommendations Home health PT;Follow surgeon's recommendation for DC plan and follow-up therapies;Supervision - Intermittent    Equipment Recommendations  None recommended by PT    Recommendations for Other Services       Precautions / Restrictions Precautions Precautions: Back Precaution Booklet Issued: No Precaution Comments: Educated on BLT precautions and log roll for bed mobility  Restrictions Weight Bearing Restrictions: No      Mobility  Bed Mobility Overal bed mobility: Needs Assistance Bed Mobility: Rolling;Sidelying to Sit Rolling: Mod assist Sidelying to sit: Mod assist       General bed mobility comments: log roll technique, assist with legs and trunk    Transfers Overall transfer level: Needs assistance Equipment used: Rolling walker (2 wheeled) Transfers: Stand Pivot Transfers   Stand pivot transfers: Min guard          Ambulation/Gait Ambulation/Gait assistance: Min guard;Supervision Ambulation Distance (Feet): 120 Feet Assistive device: Rolling walker (2 wheeled) Gait Pattern/deviations: Antalgic Gait velocity: 0.86m/s    General Gait Details: slow but, reports to feel well, better than when lying in bed.   Stairs            Wheelchair Mobility    Modified Rankin (Stroke Patients Only)       Balance Overall balance assessment: Modified Independent                                           Pertinent Vitals/Pain Pain Assessment: 0-10 Pain Score: 4  Pain Descriptors / Indicators: Operative site guarding Pain Intervention(s): Limited activity within patient's tolerance;Monitored during session;Premedicated before session    Home Living Family/patient expects to be discharged to:: Private residence Living Arrangements: Spouse/significant other Available Help at Discharge: Family Type of Home: Mobile home Home Access: Ramped entrance     Home Layout: One level Home Equipment: Walker - 4 wheels Additional Comments: Has access to her mother's shower chair and wheel chair.     Prior Function Level of Independence: Independent               Hand Dominance        Extremity/Trunk Assessment                Communication  Communication: No difficulties  Cognition Arousal/Alertness: Awake/alert Behavior During Therapy: WFL for tasks assessed/performed Overall Cognitive Status: Within Functional Limits for tasks assessed                                        General Comments      Exercises     Assessment/Plan    PT Assessment Patient needs continued PT services  PT Problem List Decreased strength;Decreased activity tolerance;Decreased  balance;Decreased mobility;Decreased coordination;Decreased safety awareness;Decreased knowledge of precautions       PT Treatment Interventions DME instruction;Gait training;Functional mobility training;Therapeutic activities;Patient/family education    PT Goals (Current goals can be found in the Care Plan section)  Acute Rehab PT Goals Patient Stated Goal: return to home, modI mobility  PT Goal Formulation: With patient Time For Goal Achievement: 06/01/17 Potential to Achieve Goals: Good    Frequency Min 5X/week   Barriers to discharge        Co-evaluation               AM-PAC PT "6 Clicks" Daily Activity  Outcome Measure Difficulty turning over in bed (including adjusting bedclothes, sheets and blankets)?: A Lot Difficulty moving from lying on back to sitting on the side of the bed? : Unable Difficulty sitting down on and standing up from a chair with arms (e.g., wheelchair, bedside commode, etc,.)?: A Little Help needed moving to and from a bed to chair (including a wheelchair)?: A Little Help needed walking in hospital room?: A Little Help needed climbing 3-5 steps with a railing? : A Lot 6 Click Score: 14    End of Session Equipment Utilized During Treatment: Gait belt Activity Tolerance: Patient tolerated treatment well Patient left: in chair;with family/visitor present Nurse Communication: Mobility status PT Visit Diagnosis: Difficulty in walking, not elsewhere classified (R26.2);Other abnormalities of gait and mobility (R26.89)    Time: 1610-9604 PT Time Calculation (min) (ACUTE ONLY): 27 min   Charges:   PT Evaluation $PT Eval Low Complexity: 1 Low PT Treatments $Therapeutic Activity: 8-22 mins   PT G Codes:       2:00 PM, 2017/06/26 Rosamaria Lints, PT, DPT Physical Therapist - [place] [phone] (Pager)  [phone] (Office)      Rinaldo Macqueen C 06/26/2017, 1:55 PM

## 2017-05-27 NOTE — Discharge Instructions (Addendum)
No lifting greater than 10 lbs. Avoid bending, stooping and twisting. Walk in house for first week them may start to get out slowly increasing distance up to one mile by 3 weeks post op. Keep incision dry for 3 days, may use tegaderm or similar water impervious dressing. If head ache, increasing nausea and vomitting, or photophobia (light irritating eyes) please call our office. If any increase in drainage please call our office.   Discontinue antihypertension medications until increase blood pressure indicates need to resume.  Please contact your primary care MD to determine when resumption of the high blood pressure medications is indicated.  Laminectomy, Care After This sheet gives you information about how to care for yourself after your procedure. Your health care provider may also give you more specific instructions. If you have problems or questions, contact your health care provider. What can I expect after the procedure? After the procedure, it is common to have:  Some pain around your incision area.  Muscle tightening (spasms) across the back.  Follow these instructions at home: Incision care  Follow instructions from your health care provider about how to take care of your incision area. Make sure you: ? Wash your hands with soap and water before and after you apply medicine to the area or change your bandage (dressing). If soap and water are not available, use hand sanitizer. ? Change your dressing as told by your health care provider. ? Leave stitches (sutures), skin glue, or adhesive strips in place. These skin closures may need to stay in place for 2 weeks or longer. If adhesive strip edges start to loosen and curl up, you may trim the loose edges. Do not remove adhesive strips completely unless your health care provider tells you to do that.  Check your incision area every day for signs of infection. Check for: ? More redness, swelling, or pain. ? More fluid or  blood. ? Warmth. ? Pus or a bad smell. Medicines  Take over-the-counter and prescription medicines only as told by your health care provider.  If you were prescribed an antibiotic medicine, use it as told by your health care provider. Do not stop using the antibiotic even if you start to feel better. Bathing  Do not take baths, swim, or use a hot tub for 2 weeks, or until your incision has healed completely.  If your health care provider approves, you may take showers after your dressing has been removed. Activity  Return to your normal activities as told by your health care provider. Ask your health care provider what activities are safe for you.  Avoid bending or twisting at your waist. Always bend at your knees.  Do not sit for more than 20-30 minutes at a time. Lie down or walk between periods of sitting.  Do not lift anything that is heavier than 10 lb (4.5 kg) or the limit that your health care provider tells you, until he or she says that it is safe.  Do not drive for 2 weeks after your procedure or for as long as your health care provider tells you.  Do not drive or use heavy machinery while taking prescription pain medicine. General instructions  To prevent or treat constipation while you are taking prescription pain medicine, your health care provider may recommend that you: ? Drink enough fluid to keep your urine clear or pale yellow. ? Take over-the-counter or prescription medicines. ? Eat foods that are high in fiber, such as fresh fruits and  vegetables, whole grains, and beans. ? Limit foods that are high in fat and processed sugars, such as fried and sweet foods.  Do breathing exercises as told.  Keep all follow-up visits as told by your health care provider. This is important. Contact a health care provider if:  You have more redness, swelling, or pain around your incision area.  Your incision feels warm to the touch.  You are not able to return to  activities or do exercises as told by your health care provider. Get help right away if:  You have: ? More fluid or blood coming from your incision area. ? Pus or a bad smell coming from your incision area. ? Chills or a fever. ? Episodes of dizziness or fainting while standing.  You develop a rash.  You develop shortness of breath or you have difficulty breathing.  You cannot control when you urinate or have a bowel movement.  You become weak.  You are not able to use your legs. Summary  After the procedure, it is common to have some pain around your incision area. You may also have muscle tightening (spasms) across the back.  Follow instructions from your health care provider about how to care for your incision.  Do not lift anything that is heavier than 10 lb (4.5 kg) or the limit that your health care provider tells you, until he or she says that it is safe.  Contact your health care provider if you have more redness, swelling, or pain around your incision area or if your incision feels warm to the touch. These can be signs of infection. This information is not intended to replace advice given to you by your health care provider. Make sure you discuss any questions you have with your health care provider. Document Released: 08/03/2004 Document Revised: 08/31/2015 Document Reviewed: 07/02/2015 Elsevier Interactive Patient Education  2018 [employer].

## 2017-05-27 NOTE — Progress Notes (Deleted)
OT Cancellation Note  Patient Details Name: Heather [name] MRN: [id number] DOB: 1945/11/25   Cancelled Treatment:    Reason Eval/Treat Not Completed: Medical issues which prohibited therapy. Active bedrest order.OT orders have been d/c by MD ( No updates since verbal orders for patient to remain flat in bed. Awaiting revevalution from surgical team. ) Will continue to follow acutely, will evaluate once orders are updated and patient is clear for mobilization    Galen Manila 05/27/2017, 11:40 AM

## 2017-05-27 NOTE — Evaluation (Signed)
Occupational Therapy Evaluation Patient Details Name: Heather [name] MRN: [id number] DOB: 1945-09-14 Today's Date: 05/27/2017    History of Present Illness Dezirae Service is a 72yo white female who comes to [facility] for lumbar laminectomy and dural repair after chronic Lumbar Spinal Stenosis. At baseline, pt is a community dwelling adult without AD, hx of transient shooting leg pain and plantar srural numbness.    Clinical Impression   Eval limited due to low BP (nurse tech checking upon arrival). Pt with decline in function and safety with ADLs and ADL mobility with decreased balance and endurance. ADL mobility deferred due to low BP 80/32. RN aware and in to check manually and to call MD. Per PT note pt is min guard A with transfers. Pt would benefit from acute OT services to address impairments to maximize level of function and safety  Follow Up Recommendations  Follow surgeon's recommendation for DC plan and follow-up therapies;Home health OT;Supervision - Intermittent    Equipment Recommendations  Tub/shower seat;Other (comment)(ADL A/E kit)    Recommendations for Other Services       Precautions / Restrictions Precautions Precautions: Back Precaution Booklet Issued: Yes (comment) Precaution Comments: reviewed BLT, no arching precautions  Restrictions Weight Bearing Restrictions: No      Mobility Bed Mobility Overal bed mobility: Needs Assistance Bed Mobility: Rolling;Sidelying to Sit Rolling: Mod assist Sidelying to sit: Mod assist       General bed mobility comments: pt up in recliner upon arrival  Transfers Overall transfer level: Needs assistance Equipment used: Rolling walker (2 wheeled) Transfers: Stand Pivot Transfers   Stand pivot transfers: Min guard       General transfer comment: NT, deferred due to low BP 80/32. RN aware and in to check manually and to call MD. Per PT note pt is min guard A with transfers    Balance Overall balance assessment: (NT)                                          ADL either performed or assessed with clinical judgement   ADL Overall ADL's : Needs assistance/impaired Eating/Feeding: Independent;Sitting   Grooming: Wash/dry hands;Wash/dry face;Set up;Sitting   Upper Body Bathing: Set up;Supervision/ safety;Sitting   Lower Body Bathing: Moderate assistance   Upper Body Dressing : Set up;Supervision/safety;Sitting   Lower Body Dressing: Moderate assistance     Toilet Transfer Details (indicate cue type and reason): NT, deferred due to low BP 80/32. RN aware and in to check manually and to call MD. Per PT note pt is min guard A with transfers   Toileting - Clothing Manipulation Details (indicate cue type and reason): NT, deferred due to low BP 80/32. RN aware and in to check manually and to call MD. Per PT note pt is min guard A with transfers   Tub/Shower Transfer Details (indicate cue type and reason): NT, deferred due to low BP 80/32. RN aware and in to check manually and to call MD. Per PT note pt is min guard A with transfers Functional mobility during ADLs: (NT, deferred due to low BP 80/32. RN aware and in to check manually and to call MD. Per PT note pt is min guard A with transfers) General ADL Comments: pt and friend educated on ADL A/E and DME for home use     Vision Baseline Vision/History: Wears glasses Wears Glasses: Reading only Patient Visual Report:  No change from baseline       Perception     Praxis      Pertinent Vitals/Pain Pain Assessment: 0-10 Pain Score: 4  Pain Location: back Pain Descriptors / Indicators: Operative site guarding Pain Intervention(s): Limited activity within patient's tolerance;Monitored during session;Premedicated before session     Hand Dominance Right   Extremity/Trunk Assessment Upper Extremity Assessment Upper Extremity Assessment: Overall WFL for tasks assessed   Lower Extremity Assessment Lower Extremity Assessment: Defer to PT  evaluation       Communication Communication Communication: No difficulties   Cognition Arousal/Alertness: Awake/alert Behavior During Therapy: WFL for tasks assessed/performed Overall Cognitive Status: Within Functional Limits for tasks assessed                                     General Comments       Exercises     Shoulder Instructions      Home Living Family/patient expects to be discharged to:: Private residence Living Arrangements: Spouse/significant other Available Help at Discharge: Family Type of Home: Mobile home Home Access: Ramped entrance     Home Layout: One level     Bathroom Shower/Tub: Occupational psychologist: Standard     Home Equipment: Environmental consultant - 4 wheels   Additional Comments: Has access to her mother's shower chair and wheel chair.       Prior Functioning/Environment Level of Independence: Independent                 OT Problem List: Decreased activity tolerance;Decreased knowledge of use of DME or AE;Pain;Impaired balance (sitting and/or standing)      OT Treatment/Interventions: Self-care/ADL training;DME and/or AE instruction;Therapeutic activities;Therapeutic exercise;Patient/family education    OT Goals(Current goals can be found in the care plan section) Acute Rehab OT Goals Patient Stated Goal: return to home OT Goal Formulation: With patient/family Time For Goal Achievement: 06/10/17 Potential to Achieve Goals: Good ADL Goals Pt Will Perform Grooming: with min guard assist;with caregiver independent in assisting;standing Pt Will Perform Upper Body Bathing: with set-up;with caregiver independent in assisting Pt Will Perform Lower Body Bathing: with min assist;with caregiver independent in assisting;with adaptive equipment;sit to/from stand Pt Will Perform Upper Body Dressing: with set-up;sitting;with caregiver independent in assisting Pt Will Perform Lower Body Dressing: with min assist;with adaptive  equipment;with caregiver independent in assisting;sit to/from stand Pt Will Transfer to Toilet: with supervision;ambulating;regular height toilet;grab bars Pt Will Perform Toileting - Clothing Manipulation and hygiene: with min assist;with min guard assist;sit to/from stand;with caregiver independent in assisting Pt Will Perform Tub/Shower Transfer: with supervision;ambulating;rolling walker;shower seat;3 in 1;grab bars;with caregiver independent in assisting Additional ADL Goal #1: pt will verbalize and demo back precautions for ADLs and ADL mobility  OT Frequency: Min 2X/week   Barriers to D/C:    no barriers, will have assist at home from friend 24/7       Co-evaluation              AM-PAC PT "6 Clicks" Daily Activity     Outcome Measure Help from another person eating meals?: None Help from another person taking care of personal grooming?: A Little Help from another person toileting, which includes using toliet, bedpan, or urinal?: A Little Help from another person bathing (including washing, rinsing, drying)?: A Lot Help from another person to put on and taking off regular upper body clothing?: A Little Help from another person  to put on and taking off regular lower body clothing?: A Lot 6 Click Score: 17   End of Session    Activity Tolerance: Other (comment)(ADL mobility deferred due to low BP 80/32. RN aware and in to check manually and to call MD) Patient left: in chair;with call bell/phone within reach  OT Visit Diagnosis: Other abnormalities of gait and mobility (R26.89);Pain Pain - Right/Left: (back) Pain - part of body: (back)                Time: 5110-2111 OT Time Calculation (min): 21 min Charges:  OT General Charges $OT Visit: 1 Visit OT Evaluation $OT Eval Low Complexity: 1 Low G-Codes: OT G-codes **NOT FOR INPATIENT CLASS** Functional Assessment Tool Used: AM-PAC 6 Clicks Daily Activity     Britt Bottom 05/27/2017, 2:38 PM

## 2017-05-27 NOTE — Progress Notes (Signed)
Patient states she has a cane, rollator, 1 story house w handicapped ramp in front, can get a WC from her mother's house, denies she will have difficulties getting to bathroom, a "significant other" will stay with her after DC to assist with ADLs.

## 2017-05-27 NOTE — Progress Notes (Signed)
     Subjective: 1 Day Post-Op Procedure(s) (LRB): BILATERAL PARTIAL HEMILAMINECTOMIES L3-4 AND L4-5 (N/A)Awake, alert and oriented x 4. Some low BPs today. 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 Will increase IVF rate and give hydrocortisone 100 q 8 x 2. Hold metoprolol tonight.  Patient reports pain as moderate.    Objective:   VITALS:  Temp:  [97.5 F (36.4 C)-98 F (36.7 C)] 97.5 F (36.4 C) (04/30 1407) Pulse Rate:  [51-66] 66 (04/30 1636) Resp:  [14-16] 14 (04/30 1407) BP: (80-126)/(30-53) 126/53 (04/30 1636) SpO2:  [95 %-99 %] 99 % (04/30 1636)  Neurologically intact ABD soft Neurovascular intact Sensation intact distally Intact pulses distally Dorsiflexion/Plantar flexion intact Incision: dressing C/D/I and no drainage   LABS Recent Labs    05/27/17 0315  HGB 10.0*  WBC 12.8*  PLT 196   Recent Labs    05/27/17 0315  NA 139  K 4.6  CL 105  CO2 27  BUN 11  CREATININE 0.79  GLUCOSE 141*   No results for input(s): LABPT, INR in the last 72 hours.   Assessment/Plan: 1 Day Post-Op Procedure(s) (LRB): BILATERAL PARTIAL HEMILAMINECTOMIES L3-4 AND L4-5 (N/A)  Advance diet Up with therapy Plan for discharge tomorrow  Give IVF and hydrocortisone q8 for 2 doses and medrol dose pak at discharge.  Keep tonight as she is having episodic hypotension.   [name] 05/27/2017, 7:00 PMPatient ID: Heather [name] Alert, female   DOB: [birth year], 72 y.o.   MRN: [id number]

## 2017-05-27 NOTE — Telephone Encounter (Signed)
Error

## 2017-05-27 NOTE — Care Management Obs Status (Signed)
MEDICARE OBSERVATION STATUS NOTIFICATION   Patient Details  Name: Heather [name] MRN: [id number] Date of Birth: 09-Sep-1945   Medicare Observation Status Notification Given:  Yes    Lawerance Sabal, RN 05/27/2017, 10:01 AM

## 2017-05-28 ENCOUNTER — Encounter (HOSPITAL_COMMUNITY): Payer: Self-pay | Admitting: Internal Medicine

## 2017-05-28 DIAGNOSIS — D500 Iron deficiency anemia secondary to blood loss (chronic): Secondary | ICD-10-CM | POA: Diagnosis not present

## 2017-05-28 DIAGNOSIS — M48062 Spinal stenosis, lumbar region with neurogenic claudication: Principal | ICD-10-CM

## 2017-05-28 DIAGNOSIS — I959 Hypotension, unspecified: Secondary | ICD-10-CM

## 2017-05-28 DIAGNOSIS — Z9889 Other specified postprocedural states: Secondary | ICD-10-CM | POA: Diagnosis not present

## 2017-05-28 DIAGNOSIS — I9581 Postprocedural hypotension: Secondary | ICD-10-CM | POA: Diagnosis not present

## 2017-05-28 NOTE — Progress Notes (Signed)
Occupational Therapy Treatment Patient Details Name: Heather [name] MRN: [id number] DOB: [birth year] Today's Date: 05/28/2017    History of present illness Heather [name] is a 72yo white female who comes to [facility] for lumbar laminectomy and dural repair after chronic Lumbar Spinal Stenosis. At baseline, pt is a community dwelling adult without AD, hx of transient shooting leg pain and plantar srural numbness.    OT comments  Pt making good progress with functional goals, BP WNL. Pt and family educated on toileting aid for home use, toilet and shower transfers. OT will continue to follow acutely  Follow Up Recommendations  Follow surgeon's recommendation for DC plan and follow-up therapies;Home health OT;Supervision - Intermittent    Equipment Recommendations  Tub/shower seat;Other (comment)(ADL A/E)    Recommendations for Other Services      Precautions / Restrictions Precautions Precautions: Back Precaution Booklet Issued: Yes (comment) Precaution Comments: reviewed 3/3 precautions throughout with pt Required Braces or Orthoses: (no brace ordered by MD) Restrictions Weight Bearing Restrictions: No       Mobility Bed Mobility Overal bed mobility: Needs Assistance Bed Mobility: Rolling;Sidelying to Sit Rolling: Min guard Sidelying to sit: Min assist       General bed mobility comments: pt up ambulating with PT upon arrival  Transfers Overall transfer level: Needs assistance Equipment used: Rolling walker (2 wheeled) Transfers: Sit to/from Stand Sit to Stand: Min guard         General transfer comment: min guard for safety, cueing for safe hand placement    Balance Overall balance assessment: Needs assistance Sitting-balance support: Feet supported Sitting balance-Leahy Scale: Good     Standing balance support: During functional activity Standing balance-Leahy Scale: Fair                             ADL either performed or assessed with clinical  judgement   ADL       Grooming: Wash/dry hands;Wash/dry face;Set up;Supervision/safety;Standing   Upper Body Bathing: Set up;Sitting Upper Body Bathing Details (indicate cue type and reason): simulated Lower Body Bathing: Moderate assistance Lower Body Bathing Details (indicate cue type and reason): reviewed compensatory techniques and using A/E with pt and family Upper Body Dressing : Set up;Sitting   Lower Body Dressing: Moderate assistance Lower Body Dressing Details (indicate cue type and reason): reviewed compensatory techniques and using A/E with pt and family Toilet Transfer: Min guard;Ambulation;RW;Comfort height toilet;BSC;Cueing for safety   Toileting- Clothing Manipulation and Hygiene: Min guard;Sit to/from stand;Cueing for safety   Tub/ Shower Transfer: Min guard;Ambulation;Rolling walker;Shower seat;Grab bars   Functional mobility during ADLs: Min guard General ADL Comments: pt and family eucated on shower transfer techniques and toileting aid for home use     Vision Baseline Vision/History: Wears glasses Wears Glasses: Reading only Patient Visual Report: No change from baseline     Perception     Praxis      Cognition Arousal/Alertness: Awake/alert Behavior During Therapy: WFL for tasks assessed/performed Overall Cognitive Status: Within Functional Limits for tasks assessed                                          Exercises     Shoulder Instructions       General Comments      Pertinent Vitals/ Pain       Pain Assessment: 0-10 Pain Score: 3  Pain Location: back Pain Descriptors / Indicators: Operative site guarding;Aching;Sore Pain Intervention(s): Monitored during session;Premedicated before session;Repositioned  Home Living Family/patient expects to be discharged to:: Private residence                                        Prior Functioning/Environment              Frequency  Min 2X/week         Progress Toward Goals  OT Goals(current goals can now be found in the care plan section)  Progress towards OT goals: Progressing toward goals  Acute Rehab OT Goals Patient Stated Goal: return to home  Plan Discharge plan remains appropriate    Co-evaluation                 AM-PAC PT "6 Clicks" Daily Activity     Outcome Measure   Help from another person eating meals?: None Help from another person taking care of personal grooming?: A Little Help from another person toileting, which includes using toliet, bedpan, or urinal?: A Little Help from another person bathing (including washing, rinsing, drying)?: A Lot Help from another person to put on and taking off regular upper body clothing?: A Little Help from another person to put on and taking off regular lower body clothing?: A Lot 6 Click Score: 17    End of Session Equipment Utilized During Treatment: Gait belt;Rolling walker;Other (comment)(3 in 1)  OT Visit Diagnosis: Other abnormalities of gait and mobility (R26.89);Pain Pain - Right/Left: (back) Pain - part of body: (back)   Activity Tolerance Patient tolerated treatment well   Patient Left in chair;with call bell/phone within reach;with family/visitor present   Nurse Communication      Functional Assessment Tool Used: AM-PAC 6 Clicks Daily Activity   Time: 0981-1914 OT Time Calculation (min): 32 min  Charges: OT G-codes **NOT FOR INPATIENT CLASS** Functional Assessment Tool Used: AM-PAC 6 Clicks Daily Activity OT General Charges $OT Visit: 1 Visit OT Treatments $Self Care/Home Management : 8-22 mins $Therapeutic Activity: 8-22 mins     Heather [name] 05/28/2017, 12:56 PM

## 2017-05-28 NOTE — Progress Notes (Signed)
     Subjective: 2 Days Post-Op Procedure(s) (LRB): BILATERAL PARTIAL HEMILAMINECTOMIES L3-4 AND L4-5 (N/A)Episode of hypotension yesterday with PT and increased pain. Held metoprolol. Lab shows no significant change in mild anemia, renal function is normal. Will have PT/OT work with patient today.  Patient reports pain as moderate.    Objective:   VITALS:  Temp:  [97.5 F (36.4 C)-98.6 F (37 C)] 97.9 F (36.6 C) (05/01 0522) Pulse Rate:  [51-72] 68 (05/01 0522) Resp:  [14-16] 16 (05/01 0522) BP: (80-138)/(30-56) 128/46 (05/01 0522) SpO2:  [95 %-99 %] 98 % (05/01 0522)  Neurologically intact ABD soft Neurovascular intact Sensation intact distally Intact pulses distally Dorsiflexion/Plantar flexion intact Incision: no drainage No cellulitis present   LABS Recent Labs    05/27/17 0315 05/27/17 1951  HGB 10.0* 9.9*  WBC 12.8* 10.5  PLT 196 202   Recent Labs    05/27/17 0315 05/27/17 1951  NA 139 138  K 4.6 4.9  CL 105 104  CO2 27 28  BUN 11 12  CREATININE 0.79 1.06*  GLUCOSE 141* 131*   No results for input(s): LABPT, INR in the last 72 hours.   Assessment/Plan: 2 Days Post-Op Procedure(s) (LRB): BILATERAL PARTIAL HEMILAMINECTOMIES L3-4 AND L4-5 (N/A)  Episode of hypotension. Anemia of blood loss. Dural tear intraop no sign of CSF leak post op. Hypertension and history of stroke, will ask triad hospitalist to  See and adjust antihypertension meds. Her aspirin was resumed post op.   Advance diet Up with therapy  I will ask medicine to see patient to adjust meds prior to discharge.  [name] 05/28/2017, 8:43 AMPatient ID: Heather [name] Alert, female   DOB: 08-01-1945, 72 y.o.   MRN: [id number]

## 2017-05-28 NOTE — Progress Notes (Signed)
Physical Therapy Treatment Patient Details Name: Heather [name] MRN: [id number] DOB: 1945/03/14 Today's Date: 05/28/2017    History of Present Illness Heather [name] is a 72yo white female who comes to [facility] for lumbar laminectomy and dural repair after chronic Lumbar Spinal Stenosis. At baseline, pt is a community dwelling adult without AD, hx of transient shooting leg pain and plantar srural numbness.     PT Comments    Pt making steady progress with functional mobility. Pt with improved, stable BP this session (129/42 in supine, 128/44 seated, and 142/53 standing). Pt would continue to benefit from skilled physical therapy services at this time while admitted and after d/c to address the below listed limitations in order to improve overall safety and independence with functional mobility.    Follow Up Recommendations  Home health PT;Supervision - Intermittent     Equipment Recommendations  None recommended by PT    Recommendations for Other Services       Precautions / Restrictions Precautions Precautions: Back Precaution Comments: reviewed 3/3 precautions throughout with pt Required Braces or Orthoses: ("no brace" MD ordered) Restrictions Weight Bearing Restrictions: No    Mobility  Bed Mobility Overal bed mobility: Needs Assistance Bed Mobility: Rolling;Sidelying to Sit Rolling: Min guard Sidelying to sit: Min assist       General bed mobility comments: cueing for log roll technique, min A for trunk elevation  Transfers Overall transfer level: Needs assistance Equipment used: Rolling walker (2 wheeled) Transfers: Sit to/from Stand Sit to Stand: Min guard         General transfer comment: min guard for safety, cueing for safe hand placement  Ambulation/Gait Ambulation/Gait assistance: Min guard Ambulation Distance (Feet): 100 Feet Assistive device: Rolling walker (2 wheeled) Gait Pattern/deviations: Step-through pattern;Decreased stride length Gait velocity:  decreased Gait velocity interpretation: <1.8 ft/sec, indicate of risk for recurrent falls General Gait Details: cautious, slow and guarded but overall steady with RW; min guard for safety   Stairs             Wheelchair Mobility    Modified Rankin (Stroke Patients Only)       Balance Overall balance assessment: Needs assistance Sitting-balance support: Feet supported Sitting balance-Leahy Scale: Good     Standing balance support: During functional activity Standing balance-Leahy Scale: Fair                              Cognition Arousal/Alertness: Awake/alert Behavior During Therapy: WFL for tasks assessed/performed Overall Cognitive Status: Within Functional Limits for tasks assessed                                        Exercises      General Comments        Pertinent Vitals/Pain Pain Assessment: No/denies pain    Home Living                      Prior Function            PT Goals (current goals can now be found in the care plan section) Acute Rehab PT Goals PT Goal Formulation: With patient Time For Goal Achievement: 06/01/17 Potential to Achieve Goals: Good Progress towards PT goals: Progressing toward goals    Frequency    Min 5X/week      PT Plan Current plan remains appropriate  Co-evaluation              AM-PAC PT "6 Clicks" Daily Activity  Outcome Measure  Difficulty turning over in bed (including adjusting bedclothes, sheets and blankets)?: A Little Difficulty moving from lying on back to sitting on the side of the bed? : Unable Difficulty sitting down on and standing up from a chair with arms (e.g., wheelchair, bedside commode, etc,.)?: Unable Help needed moving to and from a bed to chair (including a wheelchair)?: None Help needed walking in hospital room?: A Little Help needed climbing 3-5 steps with a railing? : A Little 6 Click Score: 15    End of Session Equipment Utilized  During Treatment: Gait belt Activity Tolerance: Patient tolerated treatment well Patient left: in chair;with call bell/phone within reach;with chair alarm set Nurse Communication: Mobility status PT Visit Diagnosis: Difficulty in walking, not elsewhere classified (R26.2);Other abnormalities of gait and mobility (R26.89)     Time: 1610-9604 PT Time Calculation (min) (ACUTE ONLY): 32 min  Charges:  $Gait Training: 8-22 mins $Therapeutic Activity: 8-22 mins                    G Codes:       [place], [place], [place] [phone]    Alessandra Bevels Cheryl Stabenow 05/28/2017, 11:29 AM

## 2017-05-28 NOTE — Care Management Note (Addendum)
Case Management Note  Patient Details  Name: Heather [name] MRN: [id number] Date of Birth: [birth year]  Subjective/Objective:   Lumbar Laminectomy                 Action/Plan: NCM spoke to pt and friend at bedside. Lives at home with boyfriend. Offered choice for HH/list provided. Pt requested [facility] for HHPT. Contacted [facility] for [facility] and DME for home. RW and 3n1 bc to be delivered to room prior to dc.   [facility] delivered RW and 3n1 to pt's room.   Expected Discharge Date:  05/29/17               Expected Discharge Plan:  Home w Home Health Services  In-House Referral:  NA  Discharge planning Services  CM Consult  Post Acute Care Choice:  Home Health Choice offered to:  Patient  DME Arranged:  3-N-1, Walker rolling DME Agency:  [facility]  HH Arranged:  PT [facility] Agency:  [facility]  Status of Service:  Completed, signed off  If discussed at Long Length of Stay Meetings, dates discussed:    Additional Comments:  Elliot Cousin, RN 05/28/2017, 4:20 PM

## 2017-05-28 NOTE — Consult Note (Addendum)
Medical Consultation   Heather [name]  ZOX:[id number]  DOB: 05/29/1945  DOA: 05/26/2017  PCP: Lorre Munroe, NP    Requesting physician: Otelia Sergeant, MD Reason for consultation: to help in the management of hypotension     History of Present Illness: Heather [name] is an 72 y.o. 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  Patient does not have any prior history of hypertension.  However, she did have anemia of blood loss due to dural tear intraoperatively, with no signs of CSF leak, receiving transfusion for this.  She was given IV fluids, and at the time, her metoprolol was held, Cozaar was discontinued, with some improvement in her blood pressure.  The patient is expected to be discharged soon, but medication adjustment is being evaluated, prior to her going home.  Denies fevers, chills, night sweats, vision changes, or mucositis. Denies any respiratory complaints. Denies any chest pain or palpitations, syncope or presyncope. Denies lower extremity swelling. Denies nausea, heartburn or change in bowel habits.  Denies abdominal pain. Appetite is normal. Denies any dysuria. Denies abnormal skin rashes, or neuropathy. Denies any bleeding issues such as epistaxis, hematemesis, hematuria or hematochezia. Ambulating with assistance without becoming dizzy . No ETOH or recreational drug use.   Review of Systems:  As per HPI otherwise all other systems reviewed and are negative    Past Medical History: Past Medical History:  Diagnosis Date  . Anxiety   . Arthritis    "knees, right hip, lumbar back" (12/17/2016)  . Chicken pox   . Chronic lower back  pain   . DDD (degenerative disc disease), lumbar   . Depression   . Headache 06/23/2015   "2d after carotid OR & again today" (12/17/2016)  . Heart murmur    MVP  . High cholesterol   . History of blood transfusion 1975   "related to femur  fracture" (12/17/2016)  . HOH (hard of hearing)    left  . Hypertension   . MVP (mitral valve prolapse)    echo 1986-mild  . 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 Shortness of breath dyspnea    WITH EXERTION    . Spinal stenosis of lumbar region   . Stroke [facility]) 04/2015   "light stroke; sometimes my thinking is slow since" (12/17/2016)  . Wears glasses   . Wears glasses     Past Surgical History: Past Surgical History:  Procedure Laterality Date  . CARPAL TUNNEL RELEASE Right 03/31/2013   Procedure: RIGHT CARPAL TUNNEL RELEASE;  Surgeon: Nicki Reaper, MD;  Location: [facility];  Service: Orthopedics;  Laterality: Right;  . ENDARTERECTOMY Right 05/03/2015   Procedure: RIGHT CAROTID ENDARTERECTOMY ;  Surgeon: Fransisco Hertz, MD;  Location: [facility] OR;  Service: Vascular;  Laterality: Right;  . FEMUR FRACTURE SURGERY Right 1975   "hip"  . FRACTURE SURGERY    . LUMBAR LAMINECTOMY N/A 05/26/2017   Procedure: BILATERAL PARTIAL HEMILAMINECTOMIES L3-4 AND L4-5;  Surgeon: Kerrin Champagne, MD;  Location: [facility] OR;  Service: Orthopedics;  Laterality: N/A;  . NASAL SINUS SURGERY  1975  . PLANTAR FASCIA RELEASE Bilateral 1968  . SHOULDER OPEN ROTATOR CUFF REPAIR Right 1998  . TONSILLECTOMY  1951  . TUBAL LIGATION  1995     Allergies:   Allergies  Allergen Reactions  . Sulfamethoxazole Swelling and Other (See Comments)    SWELLING REACTION UNSPECIFIED      Social History: Social History   Socioeconomic History  . Marital status: Divorced    Spouse name: Not on file  . Number of children: 1  . Years of education: 66  . Highest education level: Not  on file  Occupational History  . Occupation: Retired  Engineer, production  . Financial resource strain: Not on file  . Food insecurity:    Worry: Not on file    Inability: Not on file  . Transportation needs:    Medical: Not on file    Non-medical: Not on file  Tobacco Use  . Smoking status: Never Smoker  . Smokeless tobacco: Never Used  Substance and Sexual Activity  . Alcohol use: Not Currently    Alcohol/week: 1.8 oz    Types: 3 Cans of beer per week  . Drug use: No  . Sexual activity: Yes  Lifestyle  . Physical activity:    Days per week: Not on file    Minutes per session: Not on file  . Stress: Not on file  Relationships  . Social connections:    Talks on phone: Not on file    Gets together: Not on file    Attends religious service: Not on file    Active member of club or organization: Not on file    Attends meetings of clubs or organizations: Not on file    Relationship status: Not on file  . Intimate partner violence:    Fear of current or ex partner: Not on file    Emotionally abused: Not on file    Physically abused: Not on file    Forced sexual activity: Not on file  Other Topics Concern  . Not on file  Social History Narrative   Lives at home w/ significant other   Right-handed   Drinks 2 cups caffeine per day       Family History: Family History  Adopted: Yes  Problem Relation Age of Onset  . Osteoporosis Mother   . Heart disease Mother   . Hypertension Mother   . Hyperlipidemia Mother   . Stroke Mother   . Arthritis Mother   . Diabetes Father   . Heart attack Father   . Cancer Sister        breast  . Colon cancer Neg Hx     Family history reviewed and not pertinent    Physical Exam: Vitals:   05/27/17 1546 05/27/17 1636 05/27/17 1952 05/28/17 0522  BP: (!) 104/38 (!) 126/53 (!) 138/56 (!) 128/46  Pulse: (!) 57 66 72 68  Resp:   15 16  Temp:   98.6 F (37 C) 97.9 F (36.6 C)  TempSrc:   Oral Oral  SpO2: 98% 99% 98% 98%  Weight:        Height:        Constitutional: Appears calm,  alert and awake, oriented x3, not in any acute distress. Eyes: PERLA, EOMI, irises appear normal, anicteric sclera,  ENMT: external ears and nose appear normal,   Lips appears normal, oropharynx mucosa, tongue  Neck: neck appears  normal, no masses, normal ROM, no thyromegaly, no JVD  CVS: S1-S2 clear, no murmur rubs or gallops, no LE edema, normal pedal pulses  Respiratory: clear to auscultation bilaterally, no wheezing, rales or rhonchi. Respiratory effort normal. No accessory muscle use.  Abdomen: soft nontender, nondistended, normal bowel sounds, no hepatosplenomegaly, no hernias  Musculoskeletal: no cyanosis, clubbing or edema noted bilaterally Joint/bones/muscle exam, strength, contractures or atrophy Neuro: Cranial nerves II-XII intact, strength, sensation, reflexes Psych: judgement and insight appear normal, flat mood and affect, normal mental status Skin: no rashes or lesions or ulcers, no induration or nodules. Bandage in slumbar area, no surrounding erythema  Data reviewed:  I have personally reviewed following labs and imaging studies Labs:  CBC: Recent Labs  Lab 05/21/17 1138 05/27/17 0315 05/27/17 1951  WBC 8.7 12.8* 10.5  NEUTROABS  --   --  7.7  HGB 13.0 10.0* 9.9*  HCT 39.5 30.2* 30.4*  MCV 93.2 92.4 94.4  PLT 259 196 202    Basic Metabolic Panel: Recent Labs  Lab 05/21/17 1138 05/27/17 0315 05/27/17 1951  NA 135 139 138  K 4.6 4.6 4.9  CL 104 105 104  CO2 GLUCOSE 110* 141* 131*  BUN CREATININE 0.85 0.79 1.06*  CALCIUM 9.7 9.4 9.4   GFR Estimated Creatinine Clearance: 47.2 mL/min (A) (by C-G formula based on SCr of 1.06 mg/dL (H)). Liver Function Tests: Recent Labs  Lab 05/21/17 1138  AST 26  ALT 32  ALKPHOS 88  BILITOT 0.7  PROT 6.4*  ALBUMIN 4.0   No results for input(s): LIPASE, AMYLASE in the last 168 hours. No results for input(s): AMMONIA in the last 168  hours. Coagulation profile No results for input(s): INR, PROTIME in the last 168 hours.  Cardiac Enzymes: No results for input(s): CKTOTAL, CKMB, CKMBINDEX, TROPONINI in the last 168 hours. BNP: Invalid input(s): POCBNP CBG: Recent Labs  Lab 05/27/17 0428  GLUCAP 123*   D-Dimer No results for input(s): DDIMER in the last 72 hours. Hgb A1c No results for input(s): HGBA1C in the last 72 hours. Lipid Profile No results for input(s): CHOL, HDL, LDLCALC, TRIG, CHOLHDL, LDLDIRECT in the last 72 hours. Thyroid function studies No results for input(s): TSH, T4TOTAL, T3FREE, THYROIDAB in the last 72 hours.  Invalid input(s): FREET3 Anemia work up No results for input(s): VITAMINB12, FOLATE, FERRITIN, TIBC, IRON, RETICCTPCT in the last 72 hours. Urinalysis    Component Value Date/Time   BILIRUBINUR NEG 08/17/2012 1403   PROTEINUR TRACE 08/17/2012 1403   UROBILINOGEN 0.2 08/17/2012 1403   NITRITE NEG 08/17/2012 1403   LEUKOCYTESUR moderate (2+) 08/17/2012 1403     Sepsis Labs Invalid input(s): PROCALCITONIN,  WBC,  LACTICIDVEN Microbiology Recent Results (from the past 240 hour(s))  Surgical pcr screen     Status: Abnormal   Collection Time: 05/21/17 11:37 AM  Result Value Ref Range Status   MRSA, PCR NEGATIVE NEGATIVE Final   Staphylococcus aureus POSITIVE (A) NEGATIVE Final    Comment: (NOTE) The Xpert SA Assay (FDA approved for NASAL specimens in patients 9 years of age and older), is one component of a comprehensive surveillance program. It is not intended to diagnose infection nor to guide or monitor treatment. Performed at [facility], [address] [place], [place] [address]        Inpatient Medications:   Scheduled Meds: . aspirin EC  81 mg Oral Daily  . atorvastatin  40 mg Oral QPM  .  busPIRone  7.5 mg Oral BID  . cholecalciferol  1,000 Units Oral Daily  . docusate sodium  100 mg Oral BID  . ferrous gluconate  324 mg Oral BID WC  . gabapentin   300 mg Oral BID  . HYDROcodone-acetaminophen  1 tablet Oral Q6H  . loratadine  10 mg Oral Daily  . losartan  100 mg Oral Daily  . multivitamin with minerals  1 tablet Oral Daily  . pantoprazole  20 mg Oral BID  . sertraline  100 mg Oral Daily  . sodium chloride flush  3 mL Intravenous Q12H  . valACYclovir  500 mg Oral Daily  . vitamin C  1,000 mg Oral Daily   Continuous Infusions: . sodium chloride 50 mL/hr at 05/27/17 1415  . sodium chloride    . methocarbamol (ROBAXIN)  IV       Radiological Exams on Admission: Dg Lumbar Spine 1 View  Result Date: 05/26/2017 CLINICAL DATA:  Lateral localization radiograph in the operating room at 8:18 a.m. on May 26, 2017 EXAM: LUMBAR SPINE - 1 VIEW COMPARISON:  Lumbar spine series of January 02, 2017 FINDINGS: The metallic trocar projects over the superior aspect of the L4 spinous process. There is multilevel degenerative disc disease of the lumbar spine. There is no compression fracture. IMPRESSION: The metallic trocar projects over the superior aspect of the L4 spinous process approximately 4.3 cm posterior to the L4 vertebral body. Electronically Signed   By: David  [place]az[place] M.D.   On: 05/26/2017 12:38    Impression/Recommendations Principal Problem:   Hypotension Active Problems:   Lumbar stenosis with neurogenic claudication   Status post lumbar laminectomy   Anemia due to blood loss   Postoperative CSF leak   Post op Hypotension, likely med induced Septic shock doubted as no source of infection or fever is noted. May be some hypovolemic component in view of ABL initial poor oral fluid intake, as well as recent sedatives and opiates .BP improvement with NS 1 L bolus and currently at 100 cc/h and metoprolol held.  PAtient on Solucortef 100 mg q 6 for her epidural, and continued Cozaar at 100 mg at 1700 hrs on 4/30. Calcium is normal at 9.4 and Bicarb normal at 28. Last Echo on 12/18/16 EF 60-65 normal systolic.  Orthostatics checked at  bedside by PT Current BP 129/42, P68 lying down, 129/44 standing (no changes). No dizziness, vertigo, syncope or presyncope, chest pain or palpitations.  Continue IVF NS at 100 cc/h  Hold Cozaar and Metoprolol   Continue stress dose steroids as per Ortho Can follow as OP for BP meds adjustment   Other medical issues, including anemia, post op care as per admitting team   Thank you for this consultation.  Will sign off, feel free to contact us if any questions or concerns     Marlowe Kays PA-C [facility] 05/28/2017, 9:09 AM

## 2017-05-29 DIAGNOSIS — M48062 Spinal stenosis, lumbar region with neurogenic claudication: Secondary | ICD-10-CM | POA: Diagnosis not present

## 2017-05-29 NOTE — Progress Notes (Signed)
Discharge instructions given to pt and significant other, verbalized understanding. Discharged to home.

## 2017-05-29 NOTE — Progress Notes (Signed)
Physical Therapy Treatment Patient Details Name: Heather [name] MRN: [id number] DOB: 08/05/1945 Today's Date: 05/29/2017    History of Present Illness Heather [name] is a 72yo white female who comes to [facility] for lumbar laminectomy and dural repair after chronic Lumbar Spinal Stenosis. At baseline, pt is a community dwelling adult without AD, hx of transient shooting leg pain and plantar srural numbness.     PT Comments    Pt making steady progress. Focus of this session was on bed mobility and transfers while maintaining back precautions. Pt requires cueing and reminders of all precautions. PT also reviewed generalized walking program for pt to initiate upon d/c. Pt would continue to benefit from skilled physical therapy services at this time while admitted and after d/c to address the below listed limitations in order to improve overall safety and independence with functional mobility.    Follow Up Recommendations  Home health PT;Supervision - Intermittent     Equipment Recommendations  None recommended by PT    Recommendations for Other Services       Precautions / Restrictions Precautions Precautions: Back Precaution Comments: reviewed 3/3 precautions throughout with pt Restrictions Weight Bearing Restrictions: No    Mobility  Bed Mobility Overal bed mobility: Needs Assistance Bed Mobility: Rolling;Sidelying to Sit Rolling: Min guard Sidelying to sit: Min guard       General bed mobility comments: increased time and effort, good use of log roll techinque  Transfers Overall transfer level: Needs assistance Equipment used: Rolling walker (2 wheeled) Transfers: Sit to/from [employer] Sit to Stand: Min guard Stand pivot transfers: Min guard       General transfer comment: min guard for safety, cueing for safe hand placement  Ambulation/Gait Ambulation/Gait assistance: Min guard Ambulation Distance (Feet): 20 Feet Assistive device: Rolling walker (2  wheeled) Gait Pattern/deviations: Step-through pattern;Decreased stride length Gait velocity: decreased Gait velocity interpretation: <1.31 ft/sec, indicative of household ambulator General Gait Details: cautious, slow and guarded but overall steady with RW; min guard for safety   Stairs             Wheelchair Mobility    Modified Rankin (Stroke Patients Only)       Balance Overall balance assessment: Needs assistance Sitting-balance support: Feet supported Sitting balance-Leahy Scale: Good     Standing balance support: During functional activity Standing balance-Leahy Scale: Fair                              Cognition Arousal/Alertness: Awake/alert Behavior During Therapy: WFL for tasks assessed/performed Overall Cognitive Status: Within Functional Limits for tasks assessed                                        Exercises      General Comments        Pertinent Vitals/Pain Pain Assessment: Faces Faces Pain Scale: Hurts little more Pain Location: back Pain Descriptors / Indicators: Operative site guarding;Aching;Sore Pain Intervention(s): Monitored during session;Repositioned    Home Living                      Prior Function            PT Goals (current goals can now be found in the care plan section) Acute Rehab PT Goals PT Goal Formulation: With patient Time For Goal Achievement: 06/01/17 Potential  to Achieve Goals: Good Progress towards PT goals: Progressing toward goals    Frequency    Min 5X/week      PT Plan Current plan remains appropriate    Co-evaluation              AM-PAC PT "6 Clicks" Daily Activity  Outcome Measure  Difficulty turning over in bed (including adjusting bedclothes, sheets and blankets)?: None Difficulty moving from lying on back to sitting on the side of the bed? : A Little Difficulty sitting down on and standing up from a chair with arms (e.g., wheelchair, bedside  commode, etc,.)?: Unable Help needed moving to and from a bed to chair (including a wheelchair)?: None Help needed walking in hospital room?: A Little Help needed climbing 3-5 steps with a railing? : A Little 6 Click Score: 18    End of Session Equipment Utilized During Treatment: Gait belt Activity Tolerance: Patient tolerated treatment well Patient left: in chair;with call bell/phone within reach Nurse Communication: Mobility status PT Visit Diagnosis: Difficulty in walking, not elsewhere classified (R26.2);Other abnormalities of gait and mobility (R26.89)     Time: 1610-9604 PT Time Calculation (min) (ACUTE ONLY): 30 min  Charges:  $Therapeutic Activity: 23-37 mins                    G Codes:       [place], [place], [place] [phone]    Alessandra Bevels Zaire Levesque 05/29/2017, 10:48 AM

## 2017-05-29 NOTE — Progress Notes (Signed)
     Subjective: 3 Days Post-Op Procedure(s) (LRB): BILATERAL PARTIAL HEMILAMINECTOMIES L3-4 AND L4-5 (N/A)Awake, [name] and oriented x 4. Hospitalist recommend d/c antihypertension meds and resume as BP recovers and if she becomes hypertensive. Close follow up with her primary care provider at [facility]. Dr. Mayford Knife. No BM   Patient reports pain as moderate.    Objective:   VITALS:  Temp:  [98 F (36.7 C)-98.6 F (37 C)] 98.6 F (37 C) (05/02 0533) Pulse Rate:  [65-73] 65 (05/02 0533) Resp:  [14-15] 14 (05/02 0533) BP: (122-153)/(43-52) 122/43 (05/02 0533) SpO2:  [97 %-100 %] 97 % (05/02 0533)  Neurologically intact ABD soft Neurovascular intact Sensation intact distally Intact pulses distally Dorsiflexion/Plantar flexion intact Incision: dressing C/D/I and no drainage   LABS Recent Labs    05/27/17 0315 05/27/17 1951  HGB 10.0* 9.9*  WBC 12.8* 10.5  PLT 196 202   Recent Labs    05/27/17 0315 05/27/17 1951  NA 139 138  K 4.6 4.9  CL 105 104  CO2 27 28  BUN 11 12  CREATININE 0.79 1.06*  GLUCOSE 141* 131*   No results for input(s): LABPT, INR in the last 72 hours.   Assessment/Plan: 3 Days Post-Op Procedure(s) (LRB): BILATERAL PARTIAL HEMILAMINECTOMIES L3-4 AND L4-5 (N/A)  Anemia due to blood loss acutely. Hypotension History of stroke.  Advance diet Up with therapy Discharge home with home health  Heather [name] 05/29/2017, 1:57 PMPatient ID: Heather [name], female   DOB: 02-05-1945, 72 y.o.   MRN: [id number]

## 2017-06-02 ENCOUNTER — Encounter: Payer: Self-pay | Admitting: Internal Medicine

## 2017-06-02 ENCOUNTER — Ambulatory Visit (INDEPENDENT_AMBULATORY_CARE_PROVIDER_SITE_OTHER): Payer: Medicare Other | Admitting: Internal Medicine

## 2017-06-02 ENCOUNTER — Telehealth (INDEPENDENT_AMBULATORY_CARE_PROVIDER_SITE_OTHER): Payer: Self-pay | Admitting: Specialist

## 2017-06-02 VITALS — BP 140/88 | HR 76 | Temp 98.3°F | Wt 157.0 lb

## 2017-06-02 DIAGNOSIS — N3946 Mixed incontinence: Secondary | ICD-10-CM

## 2017-06-02 DIAGNOSIS — Z9889 Other specified postprocedural states: Secondary | ICD-10-CM | POA: Diagnosis not present

## 2017-06-02 NOTE — Patient Instructions (Signed)
Urinary Incontinence Urinary incontinence is the involuntary loss of urine from your bladder. What are the causes? There are many causes of urinary incontinence. They include:  Medicines.  Infections.  Prostatic enlargement, leading to overflow of urine from your bladder.  Surgery.  Neurological diseases.  Emotional factors.  What are the signs or symptoms? Urinary Incontinence can be divided into four types: 1. Urge incontinence. Urge incontinence is the involuntary loss of urine before you have the opportunity to go to the bathroom. There is a sudden urge to void but not enough time to reach a bathroom. 2. Stress incontinence. Stress incontinence is the sudden loss of urine with any activity that forces urine to pass. It is commonly caused by anatomical changes to the pelvis and sphincter areas of your body. 3. Overflow incontinence. Overflow incontinence is the loss of urine from an obstructed opening to your bladder. This results in a backup of urine and a resultant buildup of pressure within the bladder. When the pressure within the bladder exceeds the closing pressure of the sphincter, the urine overflows, which causes incontinence, similar to water overflowing a dam. 4. Total incontinence. Total incontinence is the loss of urine as a result of the inability to store urine within your bladder.  How is this diagnosed? Evaluating the cause of incontinence may require:  A thorough and complete medical and obstetric history.  A complete physical exam.  Laboratory tests such as a urine culture and sensitivities.  When additional tests are indicated, they can include:  An ultrasound exam.  Kidney and bladder X-rays.  Cystoscopy. This is an exam of the bladder using a narrow scope.  Urodynamic testing to test the nerve function to the bladder and sphincter areas.  How is this treated? Treatment for urinary incontinence depends on the cause:  For urge incontinence caused  by a bacterial infection, antibiotics will be prescribed. If the urge incontinence is related to medicines you take, your health care provider may have you change the medicine.  For stress incontinence, surgery to re-establish anatomical support to the bladder or sphincter, or both, will often correct the condition.  For overflow incontinence caused by an enlarged prostate, an operation to open the channel through the enlarged prostate will allow the flow of urine out of the bladder. In women with fibroids, a hysterectomy may be recommended.  For total incontinence, surgery on your urinary sphincter may help. An artificial urinary sphincter (an inflatable cuff placed around the urethra) may be required. In women who have developed a hole-like passage between their bladder and vagina (vesicovaginal fistula), surgery to close the fistula often is required.  Follow these instructions at home:  Normal daily hygiene and the use of pads or adult diapers that are changed regularly will help prevent odors and skin damage.  Avoid caffeine. It can overstimulate your bladder.  Use the bathroom regularly. Try about every 2-3 hours to go to the bathroom, even if you do not feel the need to do so. Take time to empty your bladder completely. After urinating, wait a minute. Then try to urinate again.  For causes involving nerve dysfunction, keep a log of the medicines you take and a journal of the times you go to the bathroom. Contact a health care provider if:  You experience worsening of pain instead of improvement in pain after your procedure.  Your incontinence becomes worse instead of better. Get help right away if:  You experience fever or shaking chills.  You are unable to   pass your urine.  You have redness spreading into your groin or down into your thighs. This information is not intended to replace advice given to you by your health care provider. Make sure you discuss any questions you have  with your health care provider. Document Released: 02/22/2004 Document Revised: 08/25/2015 Document Reviewed: 06/23/2012 Elsevier Interactive Patient Education  2018 [employer]  

## 2017-06-02 NOTE — Telephone Encounter (Signed)
Heather [name] from [facility] called wanting to get verbal approval for home health care 2 week 1 and 1 week 2. CB # [phone]

## 2017-06-02 NOTE — Telephone Encounter (Signed)
I called and gave Misty Stanley a verbal auth for these orders

## 2017-06-02 NOTE — Progress Notes (Signed)
Subjective:    Patient ID: Heather [name], female    DOB: 26-Dec-1945, 72 y.o.   MRN: [id number]  HPI  Pt presents to the clinic today for [facility] Followup. She was admitted 4/29 after planned laminectomy o fL3-L4, L4-L5. Surgery was performed by Dr. Otelia Sergeant. She did have some post op anemia secondary to blood and hypotension that had resolved prior to discharge on 5/2. She is current working with PT/OT at home. She is using a walker for assistance. Her pain is controlled with Hydrocodone and Neurontin. Her appetite is slowly coming back. Her bowel is moving normally. She is having some urinary incontinence, but reports this is slightly worse. She is wearing a pad daily. She is not following with a urologist. She has not taken anything for OAB in the past. She follows up with Zonia Kief, PA this Thursday.  Review of Systems      Past Medical History:  Diagnosis Date  . Anxiety   . Arthritis    "knees, right hip, lumbar back" (12/17/2016)  . Chicken pox   . Chronic lower back pain   . DDD (degenerative disc disease), lumbar   . Depression   . Headache 06/23/2015   "2d after carotid OR & again today" (12/17/2016)  . Heart murmur    MVP  . High cholesterol   . History of blood transfusion 1975   "related to femur  fracture" (12/17/2016)  . HOH (hard of hearing)    left  . Hypertension   . MVP (mitral valve prolapse)    echo 1986-mild  . 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 Shortness of breath dyspnea    WITH EXERTION    . Spinal stenosis of lumbar region   . Stroke [facility]) 04/2015   "light stroke; sometimes my thinking is slow since" (12/17/2016)  . Wears glasses     Current Outpatient Medications  Medication Sig Dispense Refill  . Ascorbic Acid (VITAMIN C) 1000 MG tablet Take 1,000 mg by mouth daily.    Marland Kitchen aspirin EC 81 MG tablet Take 1 tablet (81 mg total) by mouth daily.    Marland Kitchen  atorvastatin (LIPITOR) 40 MG tablet Take 1 tablet (40 mg total) by mouth daily. (Patient taking differently: Take 40 mg by mouth every evening. ) 30 tablet 8  . busPIRone (BUSPAR) 7.5 MG tablet Take 1 tablet (7.5 mg total) by mouth 2 (two) times daily. 180 tablet 1  . Cholecalciferol (VITAMIN D3) 1000 UNITS CAPS Take 1,000 Units by mouth daily.     Marland Kitchen docusate sodium (COLACE) 100 MG capsule Take 1 capsule (100 mg total) by mouth 2 (two) times daily. 40 capsule 0  . ferrous gluconate (FERGON) 324 MG tablet Take 1 tablet (324 mg total) by mouth 2 (two) times daily with a meal. 60 tablet 0  . gabapentin (NEURONTIN) 300 MG capsule Take 1 capsule (300 mg total) by mouth 2 (two) times daily. 30 capsule 0  . HYDROcodone-acetaminophen (NORCO) 7.5-325 MG tablet Take 1 tablet by mouth every 6 (six) hours. 40 tablet 0  . lidocaine (XYLOCAINE) 2 % jelly Apply 1 application topically as needed. Apply to the skin over the knees and hips up to BID (Patient taking differently: Apply 1 application topically 2 (two) times daily as needed (FOR KNEE/HIP PAIN). Apply to the skin over the knees and hips up to BID) 30 mL 6  . loratadine (CLARITIN) 10 MG  tablet Take 10 mg by mouth daily.      Marland Kitchen losartan (COZAAR) 100 MG tablet TAKE 1 TABLET BY MOUTH EVERY DAY 30 tablet 11  . metoprolol tartrate (LOPRESSOR) 25 MG tablet TAKE 1 TABLET BY MOUTH TWICE A DAY 180 tablet 3  . Multiple Vitamin (MULTIVITAMIN) tablet Take 1 tablet by mouth daily.      . sertraline (ZOLOFT) 50 MG tablet TAKE 2 TABLETS BY MOUTH EVERY DAY 180 tablet 1  . valACYclovir (VALTREX) 500 MG tablet TAKE 1 TABLET BY MOUTH DAILY**CAN INCREASE TO 2 TABS FOR 5 DAY IN THE EVENT OF RECURRENCE** 30 tablet 10   No current facility-administered medications for this visit.     Allergies  Allergen Reactions  . Sulfamethoxazole Swelling and Other (See Comments)    SWELLING REACTION UNSPECIFIED     Family History  Adopted: Yes  Problem Relation Age of Onset  .  Osteoporosis Mother   . Heart disease Mother   . Hypertension Mother   . Hyperlipidemia Mother   . Stroke Mother   . Arthritis Mother   . Diabetes Father   . Heart attack Father   . Cancer Sister        breast  . Colon cancer Neg Hx     Social History   Socioeconomic History  . Marital status: Divorced    Spouse name: Not on file  . Number of children: 1  . Years of education: 56  . Highest education level: Not on file  Occupational History  . Occupation: Retired  Engineer, production  . Financial resource strain: Not on file  . Food insecurity:    Worry: Not on file    Inability: Not on file  . Transportation needs:    Medical: Not on file    Non-medical: Not on file  Tobacco Use  . Smoking status: Never Smoker  . Smokeless tobacco: Never Used  Substance and Sexual Activity  . Alcohol use: Not Currently    Alcohol/week: 1.8 oz    Types: 3 Cans of beer per week  . Drug use: No  . Sexual activity: Yes  Lifestyle  . Physical activity:    Days per week: Not on file    Minutes per session: Not on file  . Stress: Not on file  Relationships  . Social connections:    Talks on phone: Not on file    Gets together: Not on file    Attends religious service: Not on file    Active member of club or organization: Not on file    Attends meetings of clubs or organizations: Not on file    Relationship status: Not on file  . Intimate partner violence:    Fear of current or ex partner: Not on file    Emotionally abused: Not on file    Physically abused: Not on file    Forced sexual activity: Not on file  Other Topics Concern  . Not on file  Social History Narrative   Lives at home w/ significant other   Right-handed   Drinks 2 cups caffeine per day     Constitutional: Denies fever, malaise, fatigue, headache or abrupt weight changes.  Respiratory: Denies difficulty breathing, shortness of breath, cough or sputum production.   Cardiovascular: Denies chest pain, chest  tightness, palpitations or swelling in the hands or feet.  Gastrointestinal: Denies abdominal pain, bloating, constipation, diarrhea or blood in the stool.  GU: Pt reports mixed incontinence. Denies frequency, pain with urination, burning  sensation, blood in urine, odor or discharge. Musculoskeletal: Pt reports low back pain. Denies decrease in range of motion, difficulty with gait,  or joint swelling.  Neurological: Denies dizziness, difficulty with memory, difficulty with speech or problems with balance and coordination.    No other specific complaints in a complete review of systems (except as listed in HPI above).  Objective:   Physical Exam    BP 140/88   Pulse 76   Temp 98.3 F (36.8 C) (Oral)   Wt 157 lb (71.2 kg)   LMP 01/08/2012   SpO2 98%   BMI 26.13 kg/m  Wt Readings from Last 3 Encounters:  06/02/17 157 lb (71.2 kg)  05/26/17 155 lb (70.3 kg)  05/21/17 155 lb (70.3 kg)    General: Appears her stated age, well developed, well nourished in NAD. Skin: Surgical dressing intact, no drainage noted. HCardiovascular: Normal rate and rhythm.  Pulmonary/Chest: Normal effort and positive vesicular breath sounds. No respiratory distress. No wheezes, rales or ronchi noted.  Musculoskeletal: Gait slow and steady with use of walker. Neurological: [name] and oriented. Sensation intact to BLE.   BMET    Component Value Date/Time   NA 138 05/27/2017 1951   K 4.9 05/27/2017 1951   CL 104 05/27/2017 1951   CO2 28 05/27/2017 1951   GLUCOSE 131 (H) 05/27/2017 1951   BUN 12 05/27/2017 1951   CREATININE 1.06 (H) 05/27/2017 1951   CREATININE 0.84 07/27/2015 1016   CALCIUM 9.4 05/27/2017 1951   GFRNONAA 51 (L) 05/27/2017 1951   GFRAA 59 (L) 05/27/2017 1951    Lipid Panel     Component Value Date/Time   CHOL 172 11/19/2016 1141   TRIG 121.0 11/19/2016 1141   HDL 59.30 11/19/2016 1141   CHOLHDL 3 11/19/2016 1141   VLDL 24.2 11/19/2016 1141   LDLCALC 88 11/19/2016 1141     CBC    Component Value Date/Time   WBC 10.5 05/27/2017 1951   RBC 3.22 (L) 05/27/2017 1951   HGB 9.9 (L) 05/27/2017 1951   HCT 30.4 (L) 05/27/2017 1951   PLT 202 05/27/2017 1951   MCV 94.4 05/27/2017 1951   MCH 30.7 05/27/2017 1951   MCHC 32.6 05/27/2017 1951   RDW 13.4 05/27/2017 1951   LYMPHSABS 1.3 05/27/2017 1951   MONOABS 1.3 (H) 05/27/2017 1951   EOSABS 0.2 05/27/2017 1951   BASOSABS 0.0 05/27/2017 1951    Hgb A1C Lab Results  Component Value Date   HGBA1C 5.8 02/21/2015          Assessment & Plan:   [facility] Follow Up for L3-L4, L4-L5 Laminectomy:  Hospital notes, labs and imaging reviewed Pain controlled Appetite coming around Bowels moving normal Having issues with OAB, incontinence (not new from surgery)- will consider Myrbetriq after she is released from Dr. Otelia Sergeant She will continue PT/OT as prescribed She will follow with Dr. Otelia Sergeant as planned  Return precautions discussed Nicki Reaper, NP

## 2017-06-04 ENCOUNTER — Ambulatory Visit (INDEPENDENT_AMBULATORY_CARE_PROVIDER_SITE_OTHER): Payer: Medicare Other | Admitting: Surgery

## 2017-06-04 ENCOUNTER — Encounter (INDEPENDENT_AMBULATORY_CARE_PROVIDER_SITE_OTHER): Payer: Self-pay | Admitting: Surgery

## 2017-06-04 VITALS — BP 150/66 | HR 72 | Temp 97.4°F | Wt 157.0 lb

## 2017-06-04 DIAGNOSIS — Z9889 Other specified postprocedural states: Secondary | ICD-10-CM

## 2017-06-04 MED ORDER — METHOCARBAMOL 500 MG PO TABS: 500.0000 mg | ORAL_TABLET | Freq: Three times a day (TID) | ORAL | 0 refills | Status: DC | PRN

## 2017-06-04 MED ORDER — HYDROCODONE-ACETAMINOPHEN 7.5-325 MG PO TABS: 1.0000 | ORAL_TABLET | Freq: Four times a day (QID) | ORAL | 0 refills | Status: DC | PRN

## 2017-06-04 NOTE — Progress Notes (Signed)
Post-Op Visit Note   Patient: Heather [name]           Date of Birth: 05-13-1945           MRN: [id number] Visit Date: 06/04/2017 PCP: Lorre Munroe, NP   Assessment & Plan:  Chief Complaint:  Chief Complaint  Patient presents with  . Lower Back - Follow-up, Routine Post Op, Pain  Patient about 1 week status post decompression returns.  States that her preop left leg pain is gone.  No complaints of right lower extremity radicular symptoms.  Patient did have a dural repair on the right at L3-4 and she does not appear to be having any problems with this.  Needs refill of pain medication. Visit Diagnoses:  1. History of lumbar laminectomy for spinal cord decompression     Plan:.  Patient given prescriptions for Norco 7.5/325 and Robaxin.  We will follow-up with Dr. Otelia Sergeant in 4 weeks for recheck.  Will start formal PT and wean off walker.  Patient has been having some right anterior hip soreness where she has known moderate degenerative changes seen on previous x-ray and is documented in chart.  If this continues to be a problem we may consider diagnostic/therapeutic intra-articular right hip injection with Dr. Alvester Morin. Follow-Up Instructions: Return in about 1 month (around 07/02/2017) for WITH DR NITKA.   Orders:  No orders of the defined types were placed in this encounter.  Meds ordered this encounter  Medications  . HYDROcodone-acetaminophen (NORCO) 7.5-325 MG tablet    Sig: Take 1-2 tablets by mouth every 6 (six) hours as needed for moderate pain.    Dispense:  50 tablet    Refill:  0  . DISCONTD: methocarbamol (ROBAXIN) 500 MG tablet    Sig: Take 1 tablet (500 mg total) by mouth every 8 (eight) hours as needed for muscle spasms.    Dispense:  50 tablet    Refill:  0  . methocarbamol (ROBAXIN) 500 MG tablet    Sig: Take 1 tablet (500 mg total) by mouth every 8 (eight) hours as needed for muscle spasms.    Dispense:  50 tablet    Refill:  0    Imaging: No results  found.  PMFS History: Patient Active Problem List   Diagnosis Date Noted  . Hypertension 12/17/2016  . Depression 12/17/2016  . Lumbar stenosis with neurogenic claudication 07/03/2016  . Carotid disease, bilateral (HCC) 03/03/2015  . Chronic back pain 03/03/2014  . Seasonal allergies 03/03/2014  . Genital herpes 03/03/2014  . Hyperlipidemia 03/03/2013  . Anxiety and depression 12/11/2011  . Essential hypertension 11/19/2006   Past Medical History:  Diagnosis Date  . Anxiety   . Arthritis    "knees, right hip, lumbar back" (12/17/2016)  . Chicken pox   . Chronic lower back pain   . DDD (degenerative disc disease), lumbar   . Depression   . Headache 06/23/2015   "2d after carotid OR & again today" (12/17/2016)  . Heart murmur    MVP  . High cholesterol   . History of blood transfusion 1975   "related to femur  fracture" (12/17/2016)  . HOH (hard of hearing)    left  . Hypertension   . MVP (mitral valve prolapse)    echo 1986-mild  . 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 Shortness of breath dyspnea    WITH EXERTION    . Spinal stenosis  of lumbar region   . Stroke [facility]) 04/2015   "light stroke; sometimes my thinking is slow since" (12/17/2016)  . Wears glasses     Family History  Adopted: Yes  Problem Relation Age of Onset  . Osteoporosis Mother   . Heart disease Mother   . Hypertension Mother   . Hyperlipidemia Mother   . Stroke Mother   . Arthritis Mother   . Diabetes Father   . Heart attack Father   . Cancer Sister        breast  . Colon cancer Neg Hx     Past Surgical History:  Procedure Laterality Date  . CARPAL TUNNEL RELEASE Right 03/31/2013   Procedure: RIGHT CARPAL TUNNEL RELEASE;  Surgeon: Nicki Reaper, MD;  Location: [facility];  Service: Orthopedics;  Laterality: Right;  . ENDARTERECTOMY Right 05/03/2015   Procedure: RIGHT CAROTID ENDARTERECTOMY  ;  Surgeon: Fransisco Hertz, MD;  Location: [facility] OR;  Service: Vascular;  Laterality: Right;  . FEMUR FRACTURE SURGERY Right 1975   "hip"  . FRACTURE SURGERY    . LUMBAR LAMINECTOMY N/A 05/26/2017   Procedure: BILATERAL PARTIAL HEMILAMINECTOMIES L3-4 AND L4-5;  Surgeon: Kerrin Champagne, MD;  Location: [facility] OR;  Service: Orthopedics;  Laterality: N/A;  . NASAL SINUS SURGERY  1975  . PLANTAR FASCIA RELEASE Bilateral 1968  . SHOULDER OPEN ROTATOR CUFF REPAIR Right 1998  . TONSILLECTOMY  1951  . TUBAL LIGATION  1995   Social History   Occupational History  . Occupation: Retired  Tobacco Use  . Smoking status: Never Smoker  . Smokeless tobacco: Never Used  Substance and Sexual Activity  . Alcohol use: Not Currently    Alcohol/week: 1.8 oz    Types: 3 Cans of beer per week  . Drug use: No  . Sexual activity: Yes   Exam Pleasant white female [name] and oriented in no acute distress.  Surgical incision looks good.  No drainage or signs of infection.  New Steri-Strips applied.  Negative logroll bilateral hips.  No pain with straight leg raise.  Neurovascular intact.  No focal motor deficits.

## 2017-06-09 ENCOUNTER — Other Ambulatory Visit (INDEPENDENT_AMBULATORY_CARE_PROVIDER_SITE_OTHER): Payer: Self-pay | Admitting: Specialist

## 2017-06-09 NOTE — Telephone Encounter (Signed)
Gabapentin refill request 

## 2017-06-09 NOTE — Discharge Summary (Signed)
Patient ID: Heather [name] MRN: [id number] DOB/AGE: 01-31-1945 72 y.o.  Admit date: 05/26/2017 Discharge date: 06/09/2017  Admission Diagnoses:  Active Problems:   Lumbar stenosis with neurogenic claudication   Discharge Diagnoses:  Active Problems:   Lumbar stenosis with neurogenic claudication  status post Procedure(s): BILATERAL PARTIAL HEMILAMINECTOMIES L3-4 AND L4-5  Past Medical History:  Diagnosis Date  . Anxiety   . Arthritis    "knees, right hip, lumbar back" (12/17/2016)  . Chicken pox   . Chronic lower back pain   . DDD (degenerative disc disease), lumbar   . Depression   . Headache 06/23/2015   "2d after carotid OR & again today" (12/17/2016)  . Heart murmur    MVP  . High cholesterol   . History of blood transfusion 1975   "related to femur  fracture" (12/17/2016)  . HOH (hard of hearing)    left  . Hypertension   . MVP (mitral valve prolapse)    echo 1986-mild  . 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 Shortness of breath dyspnea    WITH EXERTION    . Spinal stenosis of lumbar region   . Stroke [facility]) 04/2015   "light stroke; sometimes my thinking is slow since" (12/17/2016)  . Wears glasses     Surgeries: Procedure(s): BILATERAL PARTIAL HEMILAMINECTOMIES L3-4 AND L4-5 on 05/26/2017   Consultants:   Discharged Condition: Improved  Hospital Course: DEBAR PLATE is an 72 y.o. female who was admitted 05/26/2017 for operative treatment of lumbar stenosis.   Patient failed conservative treatments (please see the history and physical for the specifics) and had severe unremitting pain that affects sleep, daily activities and work/hobbies. After pre-op clearance, the patient was taken to the operating room on 05/26/2017 and underwent  Procedure(s): BILATERAL PARTIAL HEMILAMINECTOMIES L3-4 AND L4-5.    Patient was given perioperative antibiotics:  Anti-infectives  (From admission, onward)   Start     Dose/Rate Route Frequency Ordered Stop   05/26/17 1700  valACYclovir (VALTREX) tablet 500 mg  Status:  Discontinued     500 mg Oral Daily 05/26/17 1306 05/29/17 1847   05/26/17 1630  ceFAZolin (ANCEF) IVPB 2g/100 mL premix     2 g 200 mL/hr over 30 Minutes Intravenous Every 8 hours 05/26/17 1306 05/27/17 0248   05/26/17 0645  ceFAZolin (ANCEF) IVPB 2g/100 mL premix  Status:  Discontinued     2 g 200 mL/hr over 30 Minutes Intravenous On call to O.R. 05/26/17 0631 05/26/17 4098   05/26/17 1191  ceFAZolin (ANCEF) IVPB 2g/100 mL premix     2 g 200 mL/hr over 30 Minutes Intravenous On call to O.R. 05/26/17 4782 05/26/17 0725       Patient was given sequential compression devices and early ambulation to prevent DVT.   Patient benefited maximally from hospital stay and there were no complications. At the time of discharge, the patient was urinating/moving their bowels without difficulty, tolerating a regular diet, pain is controlled with oral pain medications and they have been cleared by PT/OT.   Recent vital signs: No data found.   Recent laboratory studies: No results for input(s): WBC, HGB, HCT, PLT, NA, K, CL, CO2, BUN, CREATININE, GLUCOSE, INR, CALCIUM in the last 72 hours.  Invalid input(s): PT, 2   Discharge Medications:   Allergies as of 05/29/2017      Reactions   Sulfamethoxazole Swelling, Other (See Comments)   SWELLING REACTION  UNSPECIFIED       Medication List    STOP taking these medications   acetaminophen-codeine 300-30 MG tablet Commonly known as:  TYLENOL #3     TAKE these medications   aspirin EC 81 MG tablet Take 1 tablet (81 mg total) by mouth daily.   atorvastatin 40 MG tablet Commonly known as:  LIPITOR Take 1 tablet (40 mg total) by mouth daily. What changed:  when to take this   busPIRone 7.5 MG tablet Commonly known as:  BUSPAR Take 1 tablet (7.5 mg total) by mouth 2 (two) times daily.   docusate sodium 100  MG capsule Commonly known as:  COLACE Take 1 capsule (100 mg total) by mouth 2 (two) times daily.   ferrous gluconate 324 MG tablet Commonly known as:  FERGON Take 1 tablet (324 mg total) by mouth 2 (two) times daily with a meal.   gabapentin 300 MG capsule Commonly known as:  NEURONTIN Take 1 capsule (300 mg total) by mouth 2 (two) times daily.   HYDROcodone-acetaminophen 7.5-325 MG tablet Commonly known as:  NORCO Take 1 tablet by mouth every 6 (six) hours.   lidocaine 2 % jelly Commonly known as:  XYLOCAINE Apply 1 application topically as needed. Apply to the skin over the knees and hips up to BID What changed:    when to take this  reasons to take this  additional instructions   loratadine 10 MG tablet Commonly known as:  CLARITIN Take 10 mg by mouth daily.   losartan 100 MG tablet Commonly known as:  COZAAR TAKE 1 TABLET BY MOUTH EVERY DAY   metoprolol tartrate 25 MG tablet Commonly known as:  LOPRESSOR TAKE 1 TABLET BY MOUTH TWICE A DAY   multivitamin tablet Take 1 tablet by mouth daily.   sertraline 50 MG tablet Commonly known as:  ZOLOFT TAKE 2 TABLETS BY MOUTH EVERY DAY   valACYclovir 500 MG tablet Commonly known as:  VALTREX TAKE 1 TABLET BY MOUTH DAILY**CAN INCREASE TO 2 TABS FOR 5 DAY IN THE EVENT OF RECURRENCE**   vitamin C 1000 MG tablet Take 1,000 mg by mouth daily.   Vitamin D3 1000 units Caps Take 1,000 Units by mouth daily.       Diagnostic Studies: Dg Lumbar Spine 1 View  Result Date: 05/26/2017 CLINICAL DATA:  Lateral localization radiograph in the operating room at 8:18 a.m. on May 26, 2017 EXAM: LUMBAR SPINE - 1 VIEW COMPARISON:  Lumbar spine series of January 02, 2017 FINDINGS: The metallic trocar projects over the superior aspect of the L4 spinous process. There is multilevel degenerative disc disease of the lumbar spine. There is no compression fracture. IMPRESSION: The metallic trocar projects over the superior aspect of the L4  spinous process approximately 4.3 cm posterior to the L4 vertebral body. Electronically Signed   By: David  [place] M.D.   On: 05/26/2017 12:38    Discharge Instructions    Call MD / Call 911   Complete by:  As directed    If you experience chest pain or shortness of breath, CALL 911 and be transported to the hospital emergency room.  If you develope a fever above 101 F, pus (white drainage) or increased drainage or redness at the wound, or calf pain, call your surgeon's office.   Constipation Prevention   Complete by:  As directed    Drink plenty of fluids.  Prune juice may be helpful.  You may use a stool softener, such as Colace (over  the counter) 100 mg twice a day.  Use MiraLax (over the counter) for constipation as needed.   Diet - low sodium heart healthy   Complete by:  As directed    Discharge instructions   Complete by:  As directed    No lifting greater than 10 lbs. Avoid bending, stooping and twisting. Walk in house for first week them may start to get out slowly increasing distance up to one mile by 3 weeks post op. Keep incision dry for 3 days, may use tegaderm or similar water impervious dressing. If head ache, increasing nausea and vomitting, or photophobia (light irritating eyes) please call our office. If any increase in drainage please call our office.     Laminectomy, Care After This sheet gives you information about how to care for yourself after your procedure. Your health care provider may also give you more specific instructions. If you have problems or questions, contact your health care provider. What can I expect after the procedure? After the procedure, it is common to have: Some pain around your incision area. Muscle tightening (spasms) across the back.  Follow these instructions at home: Incision care Follow instructions from your health care provider about how to take care of your incision area. Make sure you: Wash your hands with soap and water before  and after you apply medicine to the area or change your bandage (dressing). If soap and water are not available, use hand sanitizer. Change your dressing as told by your health care provider. Leave stitches (sutures), skin glue, or adhesive strips in place. These skin closures may need to stay in place for 2 weeks or longer. If adhesive strip edges start to loosen and curl up, you may trim the loose edges. Do not remove adhesive strips completely unless your health care provider tells you to do that. Check your incision area every day for signs of infection. Check for: More redness, swelling, or pain. More fluid or blood. Warmth. Pus or a bad smell. Medicines Take over-the-counter and prescription medicines only as told by your health care provider. If you were prescribed an antibiotic medicine, use it as told by your health care provider. Do not stop using the antibiotic even if you start to feel better. Bathing Do not take baths, swim, or use a hot tub for 2 weeks, or until your incision has healed completely. If your health care provider approves, you may take showers after your dressing has been removed. Activity Return to your normal activities as told by your health care provider. Ask your health care provider what activities are safe for you. Avoid bending or twisting at your waist. Always bend at your knees. Do not sit for more than 20-30 minutes at a time. Lie down or walk between periods of sitting. Do not lift anything that is heavier than 10 lb (4.5 kg) or the limit that your health care provider tells you, until he or she says that it is safe. Do not drive for 2 weeks after your procedure or for as long as your health care provider tells you. Do not drive or use heavy machinery while taking prescription pain medicine. General instructions To prevent or treat constipation while you are taking prescription pain medicine, your health care provider may recommend that you: Drink enough  fluid to keep your urine clear or pale yellow. Take over-the-counter or prescription medicines. Eat foods that are high in fiber, such as fresh fruits and vegetables, whole grains, and beans. Limit foods that  are high in fat and processed sugars, such as fried and sweet foods. Do breathing exercises as told. Keep all follow-up visits as told by your health care provider. This is important. Contact a health care provider if: You have more redness, swelling, or pain around your incision area. Your incision feels warm to the touch. You are not able to return to activities or do exercises as told by your health care provider. Get help right away if: You have: More fluid or blood coming from your incision area. Pus or a bad smell coming from your incision area. Chills or a fever. Episodes of dizziness or fainting while standing. You develop a rash. You develop shortness of breath or you have difficulty breathing. You cannot control when you urinate or have a bowel movement. You become weak. You are not able to use your legs. Summary After the procedure, it is common to have some pain around your incision area. You may also have muscle tightening (spasms) across the back. Follow instructions from your health care provider about how to care for your incision. Do not lift anything that is heavier than 10 lb (4.5 kg) or the limit that your health care provider tells you, until he or she says that it is safe. Contact your health care provider if you have more redness, swelling, or pain around your incision area or if your incision feels warm to the touch. These can be signs of infection. This information is not intended to replace advice given to you by your health care provider. Make sure you discuss any questions you have with your health care provider. Document Released: 08/03/2004 Document Revised: 08/31/2015 Document Reviewed: 07/02/2015 Elsevier Interactive Patient Education  2018 [employer].   Driving restrictions   Complete by:  As directed    No driving for 3 weeks   Increase activity slowly as tolerated   Complete by:  As directed    Lifting restrictions   Complete by:  As directed    No lifting for 6 weeks      Follow-up Information    Kerrin Champagne, MD In 2 weeks.   Specialty:  Orthopedic Surgery Why:  For wound re-check Contact information: [address] [place] [address] [phone]        [facility] Follow up.   Specialty:  Home Health Services Why:  [facility]- agency will call to arrange initial appointment Contact information: [address] [place] [address] [phone]        Lorre Munroe, NP. Schedule an appointment as soon as possible for a visit in 1 week(s).   Specialties:  Internal Medicine, Emergency Medicine Why:  For follow up of anemia and hypertension as antihypertensive meds are stopped. This is very important as HTN is one of major causes of stroke and these meds will need to be restarted when your blood pressure becomes elevated.  Contact information: [address] [place] [address] [phone]        Lorre Munroe, NP .   Specialties:  Internal Medicine, Emergency Medicine Contact information: [address] [place] [address] [phone]           Discharge Plan:  discharge to home  Disposition:     Signed: Zonia Kief for [name] MD 06/09/2017, 12:01 PM

## 2017-06-16 ENCOUNTER — Other Ambulatory Visit (INDEPENDENT_AMBULATORY_CARE_PROVIDER_SITE_OTHER): Payer: Self-pay | Admitting: Specialist

## 2017-06-16 ENCOUNTER — Other Ambulatory Visit (INDEPENDENT_AMBULATORY_CARE_PROVIDER_SITE_OTHER): Payer: Self-pay | Admitting: Surgery

## 2017-06-16 NOTE — Telephone Encounter (Signed)
Docusate Sodium refill request

## 2017-06-16 NOTE — Telephone Encounter (Signed)
Rx refill Robaxin & Hydrocodone  [facility]

## 2017-06-16 NOTE — Telephone Encounter (Signed)
Rx request 

## 2017-06-16 NOTE — Telephone Encounter (Signed)
Sent request to Dr. Nitka 

## 2017-06-17 MED ORDER — METHOCARBAMOL 500 MG PO TABS: 500.0000 mg | ORAL_TABLET | Freq: Three times a day (TID) | ORAL | 0 refills | Status: DC | PRN

## 2017-06-17 MED ORDER — HYDROCODONE-ACETAMINOPHEN 7.5-325 MG PO TABS: 1.0000 | ORAL_TABLET | Freq: Four times a day (QID) | ORAL | 0 refills | Status: DC

## 2017-06-18 NOTE — Telephone Encounter (Signed)
I called and advised that her rx is ready for pick up

## 2017-06-20 ENCOUNTER — Telehealth (INDEPENDENT_AMBULATORY_CARE_PROVIDER_SITE_OTHER): Payer: Self-pay | Admitting: Specialist

## 2017-06-20 NOTE — Telephone Encounter (Signed)
Misty Stanley, PT with [facility] called to let Dr. Otelia Sergeant know she is discharging patient from home health physical therapy and is recommending outpatient pt for her. Patient would like to go to [facility] for therapy so if we can put in a referral for them

## 2017-06-20 NOTE — Telephone Encounter (Signed)
Misty Stanley, PT with [facility] called to let Dr. Otelia Sergeant know she is discharging patient from home health physical therapy and is recommending outpatient pt for her. Patient would like to go to [facility] for therapy so if we can put in a referral for them  If any questions, call [facility] # [phone]

## 2017-06-23 ENCOUNTER — Other Ambulatory Visit (INDEPENDENT_AMBULATORY_CARE_PROVIDER_SITE_OTHER): Payer: Self-pay | Admitting: Specialist

## 2017-06-24 NOTE — Telephone Encounter (Signed)
Gabapentin Refill request 

## 2017-06-27 ENCOUNTER — Other Ambulatory Visit (INDEPENDENT_AMBULATORY_CARE_PROVIDER_SITE_OTHER): Payer: Self-pay | Admitting: Specialist

## 2017-06-27 NOTE — Telephone Encounter (Signed)
Med refill   Hydrocodone-acetaminophen(Norco)7.5-3.25

## 2017-06-27 NOTE — Telephone Encounter (Signed)
Sent request to Dr. Nitka 

## 2017-06-30 MED ORDER — HYDROCODONE-ACETAMINOPHEN 7.5-325 MG PO TABS: 1.0000 | ORAL_TABLET | Freq: Four times a day (QID) | ORAL | 0 refills | Status: DC

## 2017-06-30 NOTE — Telephone Encounter (Signed)
rx reprinted and I called and advised pt that it was ready for pick up at the front desk

## 2017-06-30 NOTE — Addendum Note (Signed)
Addended by: Penne LashSHUE WILLS, Neysa BonitoHRISTY N on: 06/30/2017 01:50 PM   Modules accepted: Orders

## 2017-07-01 ENCOUNTER — Other Ambulatory Visit (INDEPENDENT_AMBULATORY_CARE_PROVIDER_SITE_OTHER): Payer: Self-pay | Admitting: Specialist

## 2017-07-01 NOTE — Telephone Encounter (Signed)
Methocarbamol refill request 

## 2017-07-02 ENCOUNTER — Other Ambulatory Visit (INDEPENDENT_AMBULATORY_CARE_PROVIDER_SITE_OTHER): Payer: Self-pay | Admitting: Specialist

## 2017-07-02 NOTE — Telephone Encounter (Signed)
Ferrous Gluconate refill request 

## 2017-07-03 NOTE — Telephone Encounter (Signed)
Refilled

## 2017-07-11 ENCOUNTER — Encounter (INDEPENDENT_AMBULATORY_CARE_PROVIDER_SITE_OTHER): Payer: Self-pay | Admitting: Specialist

## 2017-07-11 ENCOUNTER — Ambulatory Visit (INDEPENDENT_AMBULATORY_CARE_PROVIDER_SITE_OTHER): Payer: Medicare Other | Admitting: Specialist

## 2017-07-11 VITALS — BP 170/65 | HR 65 | Ht 65.0 in | Wt 151.0 lb

## 2017-07-11 DIAGNOSIS — I10 Essential (primary) hypertension: Secondary | ICD-10-CM

## 2017-07-11 DIAGNOSIS — Z9889 Other specified postprocedural states: Secondary | ICD-10-CM

## 2017-07-11 MED ORDER — HYDROCODONE-ACETAMINOPHEN 7.5-325 MG PO TABS: 1.0000 | ORAL_TABLET | Freq: Four times a day (QID) | ORAL | 0 refills | Status: DC

## 2017-07-11 MED ORDER — METHOCARBAMOL 500 MG PO TABS: 500.0000 mg | ORAL_TABLET | Freq: Three times a day (TID) | ORAL | 0 refills | Status: DC | PRN

## 2017-07-11 NOTE — Progress Notes (Signed)
Post-Op Visit Note   Patient: Heather [name]           Date of Birth: 09/13/1945           MRN: [id number] Visit Date: 07/11/2017 PCP: Lorre Munroe, NP   Assessment & Plan:Lumbar laminectomy bilateral partial L3-4 and L5-S1  Chief Complaint:  Chief Complaint  Patient presents with  . Lower Back - Follow-up   Incision is with minimal erythrema Motor is normal .  Visit Diagnoses:  1. S/P lumbar laminectomy     Plan: Avoid frequent bending and stooping  No lifting greater than 10 lbs. May use ice or moist heat for pain. Weight loss is of benefit. Handicap license is approved.  Apply betadiene to the lower incision daily and a bandaid for one week then discontinue.     Follow-Up Instructions: Return in about 1 month (around 08/08/2017).   Orders:  No orders of the defined types were placed in this encounter.  No orders of the defined types were placed in this encounter.   Imaging: No results found.  PMFS History: Patient Active Problem List   Diagnosis Date Noted  . Hypertension 12/17/2016  . Depression 12/17/2016  . Lumbar stenosis with neurogenic claudication 07/03/2016  . Carotid disease, bilateral (HCC) 03/03/2015  . Chronic back pain 03/03/2014  . Seasonal allergies 03/03/2014  . Genital herpes 03/03/2014  . Hyperlipidemia 03/03/2013  . Anxiety and depression 12/11/2011  . Essential hypertension 11/19/2006   Past Medical History:  Diagnosis Date  . Anxiety   . Arthritis    "knees, right hip, lumbar back" (12/17/2016)  . Chicken pox   . Chronic lower back pain   . DDD (degenerative disc disease), lumbar   . Depression   . Headache 06/23/2015   "2d after carotid OR & again today" (12/17/2016)  . Heart murmur    MVP  . High cholesterol   . History of blood transfusion 1975   "related to femur  fracture" (12/17/2016)  . HOH (hard of hearing)    left  . Hypertension   . MVP (mitral valve prolapse)    echo 1986-mild  . Primary genital  herpes simplex infection 08/21/2012   Orogenital transmission (husband had cold sore; HSV1 culture positive)  . Shortness of breath dyspnea    WITH EXERTION    . Spinal stenosis of lumbar region   . Stroke [facility]) 04/2015   "light stroke; sometimes my thinking is slow since" (12/17/2016)  . Wears glasses     Family History  Adopted: Yes  Problem Relation Age of Onset  . Osteoporosis Mother   . Heart disease Mother   . Hypertension Mother   . Hyperlipidemia Mother   . Stroke Mother   . Arthritis Mother   . Diabetes Father   . Heart attack Father   . Cancer Sister        breast  . Colon cancer Neg Hx     Past Surgical History:  Procedure Laterality Date  . CARPAL TUNNEL RELEASE Right 03/31/2013   Procedure: RIGHT CARPAL TUNNEL RELEASE;  Surgeon: Nicki Reaper, MD;  Location: [facility];  Service: Orthopedics;  Laterality: Right;  . ENDARTERECTOMY Right 05/03/2015   Procedure: RIGHT CAROTID ENDARTERECTOMY ;  Surgeon: Fransisco Hertz, MD;  Location: [facility] OR;  Service: Vascular;  Laterality: Right;  . FEMUR FRACTURE SURGERY Right 1975   "hip"  . FRACTURE SURGERY    . LUMBAR LAMINECTOMY N/A 05/26/2017   Procedure: BILATERAL  PARTIAL HEMILAMINECTOMIES L3-4 AND L4-5;  Surgeon: Kerrin ChampagneNitka, Peniel Biel E, MD;  Location: [facility] OR;  Service: Orthopedics;  Laterality: N/A;  . NASAL SINUS SURGERY  1975  . PLANTAR FASCIA RELEASE Bilateral 1968  . SHOULDER OPEN ROTATOR CUFF REPAIR Right 1998  . TONSILLECTOMY  1951  . TUBAL LIGATION  1995   Social History   Occupational History  . Occupation: Retired  Tobacco Use  . Smoking status: Never Smoker  . Smokeless tobacco: Never Used  Substance and Sexual Activity  . Alcohol use: Not Currently    Alcohol/week: 1.8 oz    Types: 3 Cans of beer per week  . Drug use: No  . Sexual activity: Yes

## 2017-07-11 NOTE — Patient Instructions (Addendum)
Avoid frequent bending and stooping  No lifting greater than 10 lbs. May use ice or moist heat for pain. Weight loss is of benefit. Handicap license is approved.  Apply betadiene to the lower incision daily and a bandaid for one week then discontinue.  Restart cozaar and lopressor take 4 hours apart.

## 2017-07-21 ENCOUNTER — Other Ambulatory Visit (INDEPENDENT_AMBULATORY_CARE_PROVIDER_SITE_OTHER): Payer: Self-pay | Admitting: Specialist

## 2017-07-21 ENCOUNTER — Other Ambulatory Visit: Payer: Self-pay

## 2017-07-21 MED ORDER — VALACYCLOVIR HCL 500 MG PO TABS: ORAL_TABLET | ORAL | 10 refills | Status: DC

## 2017-07-21 NOTE — Telephone Encounter (Signed)
Patient is requesting valacyclovir HCL 500 mg.

## 2017-07-21 NOTE — Telephone Encounter (Signed)
Gabapentin refill request 

## 2017-07-25 ENCOUNTER — Other Ambulatory Visit (INDEPENDENT_AMBULATORY_CARE_PROVIDER_SITE_OTHER): Payer: Self-pay | Admitting: Specialist

## 2017-07-25 MED ORDER — HYDROCODONE-ACETAMINOPHEN 5-325 MG PO TABS: 1.0000 | ORAL_TABLET | Freq: Four times a day (QID) | ORAL | 0 refills | Status: AC | PRN

## 2017-07-25 NOTE — Telephone Encounter (Signed)
Patient called requesting an RX refill on her Hydrocodone and the Acetomorphine (I did not see this medication on her list of current medication.)  [facility] [place]  CB#[phone].  Thank you.

## 2017-07-25 NOTE — Telephone Encounter (Signed)
Sent request to Dr. Nitka 

## 2017-07-25 NOTE — Telephone Encounter (Signed)
I have not prescribed acetomorphine and it is time to start weaning from the hydrocodone. I will prescribe only hydrocodone in 5 mg strength.

## 2017-07-28 NOTE — Telephone Encounter (Signed)
Pt is aware her rx is ready for pick up at the front desk 

## 2017-07-31 ENCOUNTER — Other Ambulatory Visit (INDEPENDENT_AMBULATORY_CARE_PROVIDER_SITE_OTHER): Payer: Self-pay | Admitting: Specialist

## 2017-08-01 NOTE — Telephone Encounter (Signed)
Ferrous Gluconate refill request 

## 2017-08-02 ENCOUNTER — Other Ambulatory Visit (INDEPENDENT_AMBULATORY_CARE_PROVIDER_SITE_OTHER): Payer: Self-pay | Admitting: Specialist

## 2017-08-03 ENCOUNTER — Other Ambulatory Visit: Payer: Self-pay | Admitting: Internal Medicine

## 2017-08-04 NOTE — Telephone Encounter (Signed)
Methocarbamol refill request 

## 2017-08-12 ENCOUNTER — Other Ambulatory Visit (INDEPENDENT_AMBULATORY_CARE_PROVIDER_SITE_OTHER): Payer: Self-pay | Admitting: Specialist

## 2017-08-12 NOTE — Addendum Note (Signed)
Addended by: Penne LashSHUE WILLS, Neysa BonitoHRISTY N on: 08/12/2017 11:26 AM   Modules accepted: Orders

## 2017-08-12 NOTE — Telephone Encounter (Signed)
Sent request to Dr. Nitka 

## 2017-08-12 NOTE — Telephone Encounter (Signed)
Patient requesting rx refill on hydrocodone-acetaminophen.  Patients # [phone]

## 2017-08-13 MED ORDER — HYDROCODONE-ACETAMINOPHEN 5-325 MG PO TABS: 1.0000 | ORAL_TABLET | Freq: Four times a day (QID) | ORAL | 0 refills | Status: DC | PRN

## 2017-08-13 NOTE — Telephone Encounter (Signed)
Pt is aware her rx is ready for pick up at the front desk 

## 2017-08-14 ENCOUNTER — Other Ambulatory Visit (INDEPENDENT_AMBULATORY_CARE_PROVIDER_SITE_OTHER): Payer: Self-pay | Admitting: Specialist

## 2017-08-14 NOTE — Telephone Encounter (Signed)
gabapentin refill request 

## 2017-08-21 ENCOUNTER — Other Ambulatory Visit (INDEPENDENT_AMBULATORY_CARE_PROVIDER_SITE_OTHER): Payer: Self-pay | Admitting: Specialist

## 2017-08-21 NOTE — Telephone Encounter (Signed)
Methocarbamol refill request 

## 2017-08-23 ENCOUNTER — Other Ambulatory Visit: Payer: Self-pay | Admitting: Internal Medicine

## 2017-08-23 ENCOUNTER — Other Ambulatory Visit (INDEPENDENT_AMBULATORY_CARE_PROVIDER_SITE_OTHER): Payer: Self-pay | Admitting: Specialist

## 2017-08-25 ENCOUNTER — Ambulatory Visit (INDEPENDENT_AMBULATORY_CARE_PROVIDER_SITE_OTHER): Payer: Self-pay | Admitting: Specialist

## 2017-08-25 NOTE — Telephone Encounter (Signed)
Please advise 

## 2017-08-26 ENCOUNTER — Other Ambulatory Visit (INDEPENDENT_AMBULATORY_CARE_PROVIDER_SITE_OTHER): Payer: Self-pay | Admitting: Specialist

## 2017-08-26 NOTE — Telephone Encounter (Signed)
Patient called requesting an RX refill on her Hydrocodone.  CB#[phone].  Thank you.

## 2017-08-26 NOTE — Telephone Encounter (Signed)
Gabapentin refill request 

## 2017-08-27 MED ORDER — HYDROCODONE-ACETAMINOPHEN 5-325 MG PO TABS: 1.0000 | ORAL_TABLET | Freq: Four times a day (QID) | ORAL | 0 refills | Status: DC | PRN

## 2017-08-27 NOTE — Telephone Encounter (Signed)
I called and advised patient her rx was ready for pick up, and that she needs to use them sparingly

## 2017-09-11 ENCOUNTER — Other Ambulatory Visit (INDEPENDENT_AMBULATORY_CARE_PROVIDER_SITE_OTHER): Payer: Self-pay | Admitting: Specialist

## 2017-09-11 NOTE — Telephone Encounter (Signed)
Gabapentin refill request 

## 2017-09-23 ENCOUNTER — Other Ambulatory Visit (INDEPENDENT_AMBULATORY_CARE_PROVIDER_SITE_OTHER): Payer: Self-pay | Admitting: Specialist

## 2017-09-23 NOTE — Telephone Encounter (Signed)
Gabapentin refill request 

## 2017-09-24 ENCOUNTER — Other Ambulatory Visit (INDEPENDENT_AMBULATORY_CARE_PROVIDER_SITE_OTHER): Payer: Self-pay | Admitting: Specialist

## 2017-09-24 NOTE — Telephone Encounter (Signed)
ferrous gluconate refill request 

## 2017-09-26 ENCOUNTER — Other Ambulatory Visit (INDEPENDENT_AMBULATORY_CARE_PROVIDER_SITE_OTHER): Payer: Self-pay | Admitting: Specialist

## 2017-09-26 NOTE — Telephone Encounter (Signed)
methocarbamol refill request

## 2017-10-06 ENCOUNTER — Ambulatory Visit (INDEPENDENT_AMBULATORY_CARE_PROVIDER_SITE_OTHER): Payer: Medicare Other | Admitting: Specialist

## 2017-10-06 ENCOUNTER — Encounter (INDEPENDENT_AMBULATORY_CARE_PROVIDER_SITE_OTHER): Payer: Self-pay | Admitting: Specialist

## 2017-10-06 VITALS — BP 115/43 | HR 57 | Ht 65.0 in | Wt 151.0 lb

## 2017-10-06 DIAGNOSIS — Z9889 Other specified postprocedural states: Secondary | ICD-10-CM

## 2017-10-06 NOTE — Progress Notes (Signed)
Office Visit Note   Patient: Heather Heather [name]           Date of Birth: [birth year]           MRN: [id number] Visit Date: 10/06/2017              Requested by: Lorre Munroe, NP [address] [place], [place] [address] PCP: Lorre Munroe, NP   Assessment & Plan: Visit Diagnoses:  1. Status post lumbar laminectomy     Plan: Knee is suffering from osteoarthritis, only real proven treatments are Weight loss, no NSIADs like diclofenac, motrin or alleve, Tylenol equate 650 mg up to 6 tablets per day  and exercise. Well padded shoes help. Ice the knee 2-3 times a day 15-20 mins at a time. Hot showers.  Low impact aerobics, stationary bike, pool walking in a heated pool, elliptical. Yoga, and Tichi. CDB oil products, [employer]  6,000 mg capsule  once a day.  Follow-Up Instructions: No follow-ups on file.   Orders:  No orders of the defined types were placed in this encounter.  No orders of the defined types were placed in this encounter.     Procedures: No procedures performed   Clinical Data: No additional findings.   Subjective: Chief Complaint  Patient presents with  . Lower Back - Follow-up    72 year old female with lumbar hemilaminectomies for lumbar spinal stenosis. She is taking ferrous gluconate for anemia. Most recently the iron was renews. She has pain intermittantly in the  Small of her back. She is trying to walk every day. She used a walker the a cane and now without a cane. She has done out patient PT, [facility] for PT post a couple of weeks. No bowel or  Bladder changes. She is having arthritis pain in the knees and restless leg syndrome with pain in the legs and the calves.    Review of Systems  Constitutional: Negative for activity change, appetite change, chills, diaphoresis, fatigue and fever.  HENT: Negative.  Negative for congestion, dental problem, drooling, ear discharge, ear pain, facial swelling, hearing loss, mouth sores,  nosebleeds, postnasal drip, rhinorrhea, sinus pressure, sinus pain, sneezing, sore throat, tinnitus, trouble swallowing and voice change.   Eyes: Negative for photophobia, pain, discharge, redness and itching.  Respiratory: Negative.  Negative for apnea, cough, choking, chest tightness, shortness of breath, wheezing and stridor.   Cardiovascular: Positive for leg swelling. Negative for chest pain and palpitations.  Gastrointestinal: Negative.  Negative for abdominal distention, abdominal pain, anal bleeding, blood in stool, constipation, diarrhea, nausea, rectal pain and vomiting.  Endocrine: Negative.  Negative for cold intolerance, heat intolerance, polydipsia and polyphagia.  Genitourinary: Negative.  Negative for difficulty urinating, dyspareunia and hematuria.  Musculoskeletal: Positive for back pain and joint swelling. Negative for arthralgias, gait problem, myalgias, neck pain and neck stiffness.  Skin: Negative.  Negative for color change, pallor, rash and wound.  Allergic/Immunologic: Negative.  Negative for environmental allergies, food allergies and immunocompromised state.  Neurological: Negative.  Negative for dizziness, tremors, seizures, syncope, facial asymmetry, speech difficulty, weakness, light-headedness, numbness and headaches.  Hematological: Negative.  Negative for adenopathy. Does not bruise/bleed easily.  Psychiatric/Behavioral: Negative.  Negative for agitation, behavioral problems, confusion, decreased concentration, dysphoric mood, hallucinations, self-injury, sleep disturbance and suicidal ideas. The patient is not nervous/anxious and is not hyperactive.      Objective: Vital Signs: BP (!) 115/43 (BP Location: Left Arm, Patient Position: Sitting)   Pulse Marland Kitchen)  57   Ht 5\' 5"  (1.651 m)   Wt 151 lb (68.5 kg)   LMP 01/08/2012   BMI 25.13 kg/m   Physical Exam  Constitutional: She is oriented to person, place, and time. She appears well-developed and well-nourished.    HENT:  Head: Normocephalic and atraumatic.  Eyes: Pupils are equal, round, and reactive to light. EOM are normal.  Neck: Normal range of motion. Neck supple.  Pulmonary/Chest: Effort normal and breath sounds normal.  Abdominal: Soft. Bowel sounds are normal.  Neurological: She is Heather [name] and oriented to person, place, and time.  Skin: Skin is warm and dry.  Psychiatric: She has a normal mood and affect. Her behavior is normal. Judgment and thought content normal.    Back Exam   Tenderness  The patient is experiencing tenderness in the lumbar.  Range of Motion  Extension: abnormal  Flexion: abnormal  Lateral bend right: abnormal  Lateral bend left: abnormal  Rotation right: abnormal  Rotation left: abnormal   Muscle Strength  Right Quadriceps:  5/5  Left Quadriceps:  5/5  Right Hamstrings:  5/5  Left Hamstrings:  5/5   Tests  Straight leg raise right: negative Straight leg raise left: negative  Reflexes  Patellar: normal Achilles: normal Biceps: normal Babinski's sign: normal   Other  Toe walk: normal Heel walk: normal Sensation: normal Gait: normal  Erythema: no back redness Scars: absent      Specialty Comments:  No specialty comments available.  Imaging: No results found.   PMFS History: Patient Active Problem List   Diagnosis Date Noted  . Hypertension 12/17/2016  . Depression 12/17/2016  . Lumbar stenosis with neurogenic claudication 07/03/2016  . Carotid disease, bilateral (HCC) 03/03/2015  . Chronic back pain 03/03/2014  . Seasonal allergies 03/03/2014  . Genital herpes 03/03/2014  . Hyperlipidemia 03/03/2013  . Anxiety and depression 12/11/2011  . Essential hypertension 11/19/2006   Past Medical History:  Diagnosis Date  . Anxiety   . Arthritis    "knees, right hip, lumbar back" (12/17/2016)  . Chicken pox   . Chronic lower back pain   . DDD (degenerative disc disease), lumbar   . Depression   . Headache 06/23/2015   "2d after  carotid OR & again today" (12/17/2016)  . Heart murmur    MVP  . High cholesterol   . History of blood transfusion 1975   "related to femur  fracture" (12/17/2016)  . HOH (hard of hearing)    left  . Hypertension   . MVP (mitral valve prolapse)    echo 1986-mild  . 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 Shortness of breath dyspnea    WITH EXERTION    . Spinal stenosis of lumbar region   . Stroke [facility]) 04/2015   "light stroke; sometimes my thinking is slow since" (12/17/2016)  . Wears glasses     Family History  Adopted: Yes  Problem Relation Age of Onset  . Osteoporosis Mother   . Heart disease Mother   . Hypertension Mother   . Hyperlipidemia Mother   . Stroke Mother   . Arthritis Mother   . Diabetes Father   . Heart attack Father   . Cancer Sister        breast  . Colon cancer Neg Hx     Past Surgical History:  Procedure Laterality Date  . CARPAL TUNNEL RELEASE Right 03/31/2013   Procedure: RIGHT CARPAL TUNNEL  RELEASE;  Surgeon: Nicki Reaper, MD;  Location: [facility];  Service: Orthopedics;  Laterality: Right;  . ENDARTERECTOMY Right 05/03/2015   Procedure: RIGHT CAROTID ENDARTERECTOMY ;  Surgeon: Fransisco Hertz, MD;  Location[facility];  Service: Vascular;  Laterality: Right;  . FEMUR FRACTURE SURGERY Right 1975   "hip"  . FRACTURE SURGERY    . LUMBAR LAMINECTOMY N/A 05/26/2017   Procedure: BILATERAL PARTIAL HEMILAMINECTOMIES L3-4 AND L4-5;  Surgeon: Kerrin Champagne, MD;  Location: [facility] OR;  Service: Orthopedics;  Laterality: N/A;  . NASAL SINUS SURGERY  1975  . PLANTAR FASCIA RELEASE Bilateral 1968  . SHOULDER OPEN ROTATOR CUFF REPAIR Right 1998  . TONSILLECTOMY  1951  . TUBAL LIGATION  1995   Social History   Occupational History  . Occupation: Retired  Tobacco Use  . Smoking status: Never Smoker  . Smokeless tobacco: Never Used  Substance and Sexual Activity  .  Alcohol use: Not Currently    Alcohol/week: 3.0 standard drinks    Types: 3 Cans of beer per week  . Drug use: No  . Sexual activity: Yes

## 2017-10-06 NOTE — Patient Instructions (Addendum)
  Knee is suffering from osteoarthritis, only real proven treatments are Weight loss, no NSIADs like diclofenac, motrin or alleve, Tylenol equate 650 mg up to 6 tablets per day  and exercise. Well padded shoes help. Ice the knee 2-3 times a day 15-20 mins at a time. Hot showers.  Low impact aerobics, stationary bike, pool walking in a heated pool, elliptical. Yoga, and Tichi. CDB oil products, [employer]  6,000 mg capsule  once a day.

## 2017-10-07 ENCOUNTER — Other Ambulatory Visit (INDEPENDENT_AMBULATORY_CARE_PROVIDER_SITE_OTHER): Payer: Self-pay | Admitting: Specialist

## 2017-10-07 NOTE — Telephone Encounter (Signed)
Gabapentin refill request 

## 2017-10-20 ENCOUNTER — Other Ambulatory Visit (INDEPENDENT_AMBULATORY_CARE_PROVIDER_SITE_OTHER): Payer: Self-pay | Admitting: Specialist

## 2017-10-20 NOTE — Telephone Encounter (Signed)
Methocarbamol refill request 

## 2017-10-22 ENCOUNTER — Other Ambulatory Visit (INDEPENDENT_AMBULATORY_CARE_PROVIDER_SITE_OTHER): Payer: Self-pay | Admitting: Specialist

## 2017-10-22 NOTE — Telephone Encounter (Signed)
Gabapentin refill request 

## 2017-10-29 ENCOUNTER — Other Ambulatory Visit: Payer: Self-pay | Admitting: Internal Medicine

## 2017-11-02 ENCOUNTER — Other Ambulatory Visit (INDEPENDENT_AMBULATORY_CARE_PROVIDER_SITE_OTHER): Payer: Self-pay | Admitting: Specialist

## 2017-11-03 NOTE — Telephone Encounter (Signed)
Ferrous Gluconate refill request 

## 2017-11-04 ENCOUNTER — Other Ambulatory Visit (INDEPENDENT_AMBULATORY_CARE_PROVIDER_SITE_OTHER): Payer: Self-pay | Admitting: Specialist

## 2017-11-04 NOTE — Telephone Encounter (Signed)
Gabapentin refill request----she is taking BID and she is only rx'd 30 capsules,

## 2017-11-10 ENCOUNTER — Other Ambulatory Visit (INDEPENDENT_AMBULATORY_CARE_PROVIDER_SITE_OTHER): Payer: Self-pay | Admitting: Specialist

## 2017-11-10 NOTE — Telephone Encounter (Signed)
Robaxin refill request 

## 2017-11-18 ENCOUNTER — Other Ambulatory Visit (INDEPENDENT_AMBULATORY_CARE_PROVIDER_SITE_OTHER): Payer: Self-pay | Admitting: Specialist

## 2017-11-18 ENCOUNTER — Encounter: Payer: Self-pay | Admitting: Internal Medicine

## 2017-11-18 ENCOUNTER — Ambulatory Visit (INDEPENDENT_AMBULATORY_CARE_PROVIDER_SITE_OTHER): Payer: Medicare Other | Admitting: Internal Medicine

## 2017-11-18 VITALS — BP 126/78 | HR 61 | Temp 98.1°F | Wt 155.0 lb

## 2017-11-18 DIAGNOSIS — N3281 Overactive bladder: Secondary | ICD-10-CM | POA: Diagnosis not present

## 2017-11-18 MED ORDER — MIRABEGRON ER 25 MG PO TB24: 25.0000 mg | ORAL_TABLET | Freq: Every day | ORAL | 2 refills | Status: DC

## 2017-11-18 NOTE — Progress Notes (Signed)
Subjective:    Patient ID: Heather [name], female    DOB: [birth year], 72 y.o.   MRN: [id number]  HPI  Pt presents to the clinic today with c/o urinary frequency, urgency and incontinence. This has been going on for many years, worsened after her recent back surgery, but has since improved. She does not have leakage with coughing and sneezing. She is having to wear incontinence pads. She does not get urinary tract infections frequently. She has seen urology in the past and is not interested in surgical intervention at this time.  Review of Systems      Past Medical History:  Diagnosis Date  . Anxiety   . Arthritis    "knees, right hip, lumbar back" (12/17/2016)  . Chicken pox   . Chronic lower back pain   . DDD (degenerative disc disease), lumbar   . Depression   . Headache 06/23/2015   "2d after carotid OR & again today" (12/17/2016)  . Heart murmur    MVP  . High cholesterol   . History of blood transfusion 1975   "related to femur  fracture" (12/17/2016)  . HOH (hard of hearing)    left  . Hypertension   . MVP (mitral valve prolapse)    echo 1986-mild  . 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 Shortness of breath dyspnea    WITH EXERTION    . Spinal stenosis of lumbar region   . Stroke [facility]) 04/2015   "light stroke; sometimes my thinking is slow since" (12/17/2016)  . Wears glasses     Current Outpatient Medications  Medication Sig Dispense Refill  . Ascorbic Acid (VITAMIN C) 1000 MG tablet Take 1,000 mg by mouth daily.    Marland Kitchen aspirin EC 81 MG tablet Take 1 tablet (81 mg total) by mouth daily.    Marland Kitchen atorvastatin (LIPITOR) 40 MG tablet Take 1 tablet (40 mg total) by mouth daily. (Patient taking differently: Take 40 mg by mouth every evening. ) 30 tablet 8  . busPIRone (BUSPAR) 7.5 MG tablet Take 1 tablet (7.5 mg total) by mouth 2 (two) times daily. MUST SCHEDULE ANNUAL EXAM 180  tablet 0  . Cholecalciferol (VITAMIN D3) 1000 UNITS CAPS Take 1,000 Units by mouth daily.     Marland Kitchen docusate sodium (COLACE) 100 MG capsule TAKE 1 CAPSULE BY MOUTH TWICE A DAY 40 capsule 0  . ferrous gluconate (FERGON) 324 MG tablet TAKE 1 TABLET (324 MG TOTAL) BY MOUTH 2 (TWO) TIMES DAILY WITH A MEAL. 60 tablet 0  . Ferrous Gluconate 324 (37.5 Fe) MG TABS TAKE 1 TABLET (324 MG TOTAL) BY MOUTH 2 (TWO) TIMES DAILY WITH A MEAL. 60 tablet 0  . gabapentin (NEURONTIN) 300 MG capsule TAKE 1 CAPSULE BY MOUTH TWICE A DAY 30 capsule 0  . lidocaine (XYLOCAINE) 2 % jelly Apply 1 application topically as needed. Apply to the skin over the knees and hips up to BID (Patient taking differently: Apply 1 application topically 2 (two) times daily as needed (FOR KNEE/HIP PAIN). Apply to the skin over the knees and hips up to BID) 30 mL 6  . loratadine (CLARITIN) 10 MG tablet Take 10 mg by mouth daily.      Marland Kitchen losartan (COZAAR) 100 MG tablet TAKE 1 TABLET BY MOUTH EVERY DAY 30 tablet 11  . methocarbamol (ROBAXIN) 500 MG tablet TAKE 1 TABLET BY MOUTH EVERY 8 HOURS AS NEEDED FOR MUSCLE SPASMS  50 tablet 0  . metoprolol tartrate (LOPRESSOR) 25 MG tablet TAKE 1 TABLET BY MOUTH TWICE A DAY 180 tablet 3  . Multiple Vitamin (MULTIVITAMIN) tablet Take 1 tablet by mouth daily.      . sertraline (ZOLOFT) 50 MG tablet Take 2 tablets (100 mg total) by mouth daily. MUST SCHEDULE ANNUAL EXAM 180 tablet 0  . valACYclovir (VALTREX) 500 MG tablet TAKE 1 TABLET BY MOUTH DAILY**CAN INCREASE TO 2 TABS FOR 5 DAY IN THE EVENT OF RECURRENCE** 30 tablet 10   No current facility-administered medications for this visit.     Allergies  Allergen Reactions  . Sulfamethoxazole Swelling and Other (See Comments)    SWELLING REACTION UNSPECIFIED     Family History  Adopted: Yes  Problem Relation Age of Onset  . Osteoporosis Mother   . Heart disease Mother   . Hypertension Mother   . Hyperlipidemia Mother   . Stroke Mother   . Arthritis Mother    . Diabetes Father   . Heart attack Father   . Cancer Sister        breast  . Colon cancer Neg Hx     Social History   Socioeconomic History  . Marital status: Divorced    Spouse name: Not on file  . Number of children: 1  . Years of education: 68  . Highest education level: Not on file  Occupational History  . Occupation: Retired  Engineer, production  . Financial resource strain: Not on file  . Food insecurity:    Worry: Not on file    Inability: Not on file  . Transportation needs:    Medical: Not on file    Non-medical: Not on file  Tobacco Use  . Smoking status: Never Smoker  . Smokeless tobacco: Never Used  Substance and Sexual Activity  . Alcohol use: Not Currently    Alcohol/week: 3.0 standard drinks    Types: 3 Cans of beer per week  . Drug use: No  . Sexual activity: Yes  Lifestyle  . Physical activity:    Days per week: Not on file    Minutes per session: Not on file  . Stress: Not on file  Relationships  . Social connections:    Talks on phone: Not on file    Gets together: Not on file    Attends religious service: Not on file    Active member of club or organization: Not on file    Attends meetings of clubs or organizations: Not on file    Relationship status: Not on file  . Intimate partner violence:    Fear of current or ex partner: Not on file    Emotionally abused: Not on file    Physically abused: Not on file    Forced sexual activity: Not on file  Other Topics Concern  . Not on file  Social History Narrative   Lives at home w/ significant other   Right-handed   Drinks 2 cups caffeine per day     Constitutional: Denies fever, malaise, fatigue, headache or abrupt weight changes.  Gastrointestinal: Denies abdominal pain, bloating, constipation, diarrhea or blood in the stool.  GU: Pt reports urinary frequency, urgency and incontinence. Denies pain with urination, burning sensation, blood in urine, odor or discharge.   No other specific  complaints in a complete review of systems (except as listed in HPI above).  Objective:   Physical Exam   BP 126/78   Pulse 61   Temp 98.1 F (  36.7 C) (Oral)   Wt 155 lb (70.3 kg)   LMP 01/08/2012   SpO2 98%   BMI 25.79 kg/m  Wt Readings from Last 3 Encounters:  11/18/17 155 lb (70.3 kg)  10/06/17 151 lb (68.5 kg)  07/11/17 151 lb (68.5 kg)    General: Appears their stated age, well developed, well nourished in NAD. Cardiovascular: Normal rate and rhythm.  Pulmonary/Chest: Normal effort and positive vesicular breath sounds. No respiratory distress. No wheezes, rales or ronchi noted.  Abdomen: Soft and nontender. Normal bowel sounds. No distention or masses noted. \ Pelvic: Deferred.  BMET    Component Value Date/Time   NA 138 05/27/2017 1951   K 4.9 05/27/2017 1951   CL 104 05/27/2017 1951   CO2 28 05/27/2017 1951   GLUCOSE 131 (H) 05/27/2017 1951   BUN 12 05/27/2017 1951   CREATININE 1.06 (H) 05/27/2017 1951   CREATININE 0.84 07/27/2015 1016   CALCIUM 9.4 05/27/2017 1951   GFRNONAA 51 (L) 05/27/2017 1951   GFRAA 59 (L) 05/27/2017 1951    Lipid Panel     Component Value Date/Time   CHOL 172 11/19/2016 1141   TRIG 121.0 11/19/2016 1141   HDL 59.30 11/19/2016 1141   CHOLHDL 3 11/19/2016 1141   VLDL 24.2 11/19/2016 1141   LDLCALC 88 11/19/2016 1141    CBC    Component Value Date/Time   WBC 10.5 05/27/2017 1951   RBC 3.22 (L) 05/27/2017 1951   HGB 9.9 (L) 05/27/2017 1951   HCT 30.4 (L) 05/27/2017 1951   PLT 202 05/27/2017 1951   MCV 94.4 05/27/2017 1951   MCH 30.7 05/27/2017 1951   MCHC 32.6 05/27/2017 1951   RDW 13.4 05/27/2017 1951   LYMPHSABS 1.3 05/27/2017 1951   MONOABS 1.3 (H) 05/27/2017 1951   EOSABS 0.2 05/27/2017 1951   BASOSABS 0.0 05/27/2017 1951    Hgb A1C Lab Results  Component Value Date   HGBA1C 5.8 02/21/2015           Assessment & Plan:

## 2017-11-18 NOTE — Assessment & Plan Note (Signed)
Will trial Myrbetriq 25 mg daily Discussed risk vs benefits, common side effects Discussed cutting back caffeine, timed voiding Offered referral to urology but she declines  Update me in 3-4 weeks and let me know how you are doing

## 2017-11-18 NOTE — Patient Instructions (Signed)

## 2017-11-18 NOTE — Telephone Encounter (Signed)
Gabapentin refill request 

## 2017-11-23 ENCOUNTER — Other Ambulatory Visit: Payer: Self-pay | Admitting: Cardiovascular Disease

## 2017-11-26 ENCOUNTER — Other Ambulatory Visit (INDEPENDENT_AMBULATORY_CARE_PROVIDER_SITE_OTHER): Payer: Self-pay | Admitting: Specialist

## 2017-11-27 NOTE — Telephone Encounter (Signed)
Methocarbamol refill, Can you please advise?

## 2017-12-02 ENCOUNTER — Other Ambulatory Visit (INDEPENDENT_AMBULATORY_CARE_PROVIDER_SITE_OTHER): Payer: Self-pay | Admitting: Specialist

## 2017-12-02 ENCOUNTER — Other Ambulatory Visit: Payer: Self-pay | Admitting: Internal Medicine

## 2017-12-02 NOTE — Telephone Encounter (Signed)
Gabapentin refill request 

## 2017-12-07 ENCOUNTER — Other Ambulatory Visit (INDEPENDENT_AMBULATORY_CARE_PROVIDER_SITE_OTHER): Payer: Self-pay | Admitting: Specialist

## 2017-12-08 NOTE — Telephone Encounter (Signed)
Ferrous Gluconate refill request 

## 2017-12-16 ENCOUNTER — Other Ambulatory Visit (INDEPENDENT_AMBULATORY_CARE_PROVIDER_SITE_OTHER): Payer: Self-pay | Admitting: Specialist

## 2017-12-16 NOTE — Telephone Encounter (Signed)
Gabapentin refill request 

## 2017-12-28 ENCOUNTER — Other Ambulatory Visit (INDEPENDENT_AMBULATORY_CARE_PROVIDER_SITE_OTHER): Payer: Self-pay | Admitting: Specialist

## 2017-12-29 NOTE — Telephone Encounter (Signed)
Gabapentin refill request 

## 2018-01-04 ENCOUNTER — Other Ambulatory Visit (INDEPENDENT_AMBULATORY_CARE_PROVIDER_SITE_OTHER): Payer: Self-pay | Admitting: Specialist

## 2018-01-05 NOTE — Telephone Encounter (Signed)
Ferrous Gluconate refill request 

## 2018-01-06 ENCOUNTER — Ambulatory Visit: Payer: Medicare Other | Admitting: Internal Medicine

## 2018-01-06 ENCOUNTER — Ambulatory Visit (INDEPENDENT_AMBULATORY_CARE_PROVIDER_SITE_OTHER): Payer: Medicare Other | Admitting: Internal Medicine

## 2018-01-06 ENCOUNTER — Encounter: Payer: Self-pay | Admitting: Internal Medicine

## 2018-01-06 VITALS — BP 130/76 | HR 55 | Temp 97.9°F | Ht 63.75 in | Wt 154.0 lb

## 2018-01-06 DIAGNOSIS — E559 Vitamin D deficiency, unspecified: Secondary | ICD-10-CM

## 2018-01-06 DIAGNOSIS — Z0000 Encounter for general adult medical examination without abnormal findings: Secondary | ICD-10-CM | POA: Diagnosis not present

## 2018-01-06 DIAGNOSIS — E7800 Pure hypercholesterolemia, unspecified: Secondary | ICD-10-CM

## 2018-01-06 DIAGNOSIS — A6004 Herpesviral vulvovaginitis: Secondary | ICD-10-CM

## 2018-01-06 DIAGNOSIS — F329 Major depressive disorder, single episode, unspecified: Secondary | ICD-10-CM

## 2018-01-06 DIAGNOSIS — F419 Anxiety disorder, unspecified: Secondary | ICD-10-CM

## 2018-01-06 DIAGNOSIS — E875 Hyperkalemia: Secondary | ICD-10-CM

## 2018-01-06 DIAGNOSIS — I6523 Occlusion and stenosis of bilateral carotid arteries: Secondary | ICD-10-CM | POA: Diagnosis not present

## 2018-01-06 DIAGNOSIS — I10 Essential (primary) hypertension: Secondary | ICD-10-CM

## 2018-01-06 DIAGNOSIS — G8929 Other chronic pain: Secondary | ICD-10-CM

## 2018-01-06 DIAGNOSIS — M549 Dorsalgia, unspecified: Secondary | ICD-10-CM

## 2018-01-06 DIAGNOSIS — N3281 Overactive bladder: Secondary | ICD-10-CM

## 2018-01-06 LAB — COMPREHENSIVE METABOLIC PANEL
ALBUMIN: 4.4 g/dL (ref 3.5–5.2)
ALT: 21 U/L (ref 0–35)
AST: 17 U/L (ref 0–37)
Alkaline Phosphatase: 101 U/L (ref 39–117)
BILIRUBIN TOTAL: 0.4 mg/dL (ref 0.2–1.2)
BUN: 23 mg/dL (ref 6–23)
CHLORIDE: 107 meq/L (ref 96–112)
CO2: 30 mEq/L (ref 19–32)
CREATININE: 1 mg/dL (ref 0.40–1.20)
Calcium: 10.1 mg/dL (ref 8.4–10.5)
GFR: 57.77 mL/min — ABNORMAL LOW (ref >60.00)
Glucose, Bld: 107 mg/dL — ABNORMAL HIGH (ref 70–99)
Potassium: 5.6 mEq/L — ABNORMAL HIGH (ref 3.5–5.1)
SODIUM: 141 meq/L (ref 135–145)
Total Protein: 6.6 g/dL (ref 6.0–8.3)

## 2018-01-06 LAB — CBC
HCT: 39.3 % (ref 36.0–46.0)
Hemoglobin: 13.1 g/dL (ref 12.0–15.0)
MCHC: 33.5 g/dL (ref 30.0–36.0)
MCV: 95.2 fl (ref 78.0–100.0)
Platelets: 265 10*3/uL (ref 150.0–400.0)
RBC: 4.12 Mil/uL (ref 3.87–5.11)
RDW: 13.7 % (ref 11.5–15.5)
WBC: 8.2 10*3/uL (ref 4.0–10.5)

## 2018-01-06 LAB — LIPID PANEL
Cholesterol: 165 mg/dL (ref 0–200)
HDL: 67.2 mg/dL (ref >39.00)
LDL Cholesterol: 78 mg/dL (ref 0–99)
NonHDL: 98.26
Total CHOL/HDL Ratio: 2
Triglycerides: 100 mg/dL (ref 0.0–149.0)
VLDL: 20 mg/dL (ref 0.0–40.0)

## 2018-01-06 LAB — VITAMIN D 25 HYDROXY (VIT D DEFICIENCY, FRACTURES): VITD: 35.51 ng/mL (ref 30.00–100.00)

## 2018-01-06 MED ORDER — SERTRALINE HCL 100 MG PO TABS: 150.0000 mg | ORAL_TABLET | Freq: Every day | ORAL | 5 refills | Status: DC

## 2018-01-06 NOTE — Assessment & Plan Note (Signed)
Controlled on Losartan and Metoprolol Reinforced DASH diet CBC and CMET today 

## 2018-01-06 NOTE — Progress Notes (Signed)
HPI:  Pt presents to the clinic today for her Medicare Wellness Exam. She is also due to follow up chronic conditions.   Anxiety and Depression: Chronic but stable on Sertraline and Buspar. She denies SI/HI.  Carotid Atherosclerosis, Bilateral: s/p CEA. She is taking Atorvastatin, Metoprolol and ASA as prescribed.  Chronic Back Pain: s/p surgery 6 months. She fell about 2 months ago and has been experiencing numbness in the left leg since that time. She takes Neurontin and Methocarbamol as prescribed.  HTN: Her BP today is 130/76. She is taking Losartan and Metoprolol as prescribed. ECG from 11/2016 reviewed.  Genital Herpes: She has not had an outbreak in many months. She takes Valtrex as needed for outbreaks.  HLD: Her last LDL was 88, 10/2016. She denies myalgias on Atorvastatin. She tries to consume a low fat diet.  OAB: She has urinary frequency. She is not taking the Myrbetriq at the moment.  Past Medical History:  Diagnosis Date  . Anxiety   . Arthritis    "knees, right hip, lumbar back" (12/17/2016)  . Chicken pox   . Chronic lower back pain   . DDD (degenerative disc disease), lumbar   . Depression   . Headache 06/23/2015   "2d after carotid OR & again today" (12/17/2016)  . Heart murmur    MVP  . High cholesterol   . History of blood transfusion 1975   "related to femur  fracture" (12/17/2016)  . HOH (hard of hearing)    left  . Hypertension   . MVP (mitral valve prolapse)    echo 1986-mild  . 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 Shortness of breath dyspnea    WITH EXERTION    . Spinal stenosis of lumbar region   . Stroke [facility](HCC) 04/2015   "light stroke; sometimes my thinking is slow since" (12/17/2016)  . Wears glasses     Current Outpatient Medications  Medication Sig Dispense Refill  . Ascorbic Acid (VITAMIN C) 1000 MG tablet Take 1,000 mg by mouth daily.    Marland Kitchen.  aspirin EC 81 MG tablet Take 1 tablet (81 mg total) by mouth daily.    Marland Kitchen. atorvastatin (LIPITOR) 40 MG tablet Take 1 tablet (40 mg total) by mouth daily. Please make yearly appt with Dr. Elease HashimotoNahser for January for future refills. 1st attempt 90 tablet 0  . busPIRone (BUSPAR) 7.5 MG tablet Take 1 tablet (7.5 mg total) by mouth 2 (two) times daily. MUST SCHEDULE ANNUAL EXAM 180 tablet 0  . Cholecalciferol (VITAMIN D3) 1000 UNITS CAPS Take 1,000 Units by mouth daily.     Marland Kitchen. docusate sodium (COLACE) 100 MG capsule TAKE 1 CAPSULE BY MOUTH TWICE A DAY 40 capsule 0  . ferrous gluconate (FERGON) 324 MG tablet TAKE 1 TABLET (324 MG TOTAL) BY MOUTH 2 (TWO) TIMES DAILY WITH A MEAL. 60 tablet 0  . gabapentin (NEURONTIN) 300 MG capsule TAKE 1 CAPSULE BY MOUTH TWICE A DAY 30 capsule 0  . lidocaine (XYLOCAINE) 2 % jelly Apply 1 application topically as needed. Apply to the skin over the knees and hips up to BID (Patient taking differently: Apply 1 application topically 2 (two) times daily as needed (FOR KNEE/HIP PAIN). Apply to the skin over the knees and hips up to BID) 30 mL 6  . loratadine (CLARITIN) 10 MG tablet Take 10 mg by mouth daily.      Marland Kitchen. losartan (COZAAR) 100 MG tablet  TAKE 1 TABLET BY MOUTH EVERY DAY 30 tablet 11  . methocarbamol (ROBAXIN) 500 MG tablet TAKE 1 TABLET BY MOUTH EVERY 8 HOURS AS NEEDED FOR MUSCLE SPASM 50 tablet 0  . metoprolol tartrate (LOPRESSOR) 25 MG tablet TAKE 1 TABLET BY MOUTH TWICE A DAY 180 tablet 3  . mirabegron ER (MYRBETRIQ) 25 MG TB24 tablet Take 1 tablet (25 mg total) by mouth daily. 30 tablet 2  . Multiple Vitamin (MULTIVITAMIN) tablet Take 1 tablet by mouth daily.      . sertraline (ZOLOFT) 50 MG tablet Take 2 tablets (100 mg total) by mouth daily. 180 tablet 0  . valACYclovir (VALTREX) 500 MG tablet TAKE 1 TABLET BY MOUTH DAILY**CAN INCREASE TO 2 TABS FOR 5 DAY IN THE EVENT OF RECURRENCE** 30 tablet 10   No current facility-administered medications for this visit.      Allergies  Allergen Reactions  . Sulfamethoxazole Swelling and Other (See Comments)    SWELLING REACTION UNSPECIFIED     Family History  Adopted: Yes  Problem Relation Age of Onset  . Osteoporosis Mother   . Heart disease Mother   . Hypertension Mother   . Hyperlipidemia Mother   . Stroke Mother   . Arthritis Mother   . Diabetes Father   . Heart attack Father   . Cancer Sister        breast  . Colon cancer Neg Hx     Social History   Socioeconomic History  . Marital status: Divorced    Spouse name: Not on file  . Number of children: 1  . Years of education: 83  . Highest education level: Not on file  Occupational History  . Occupation: Retired  Engineer, production  . Financial resource strain: Not on file  . Food insecurity:    Worry: Not on file    Inability: Not on file  . Transportation needs:    Medical: Not on file    Non-medical: Not on file  Tobacco Use  . Smoking status: Never Smoker  . Smokeless tobacco: Never Used  Substance and Sexual Activity  . Alcohol use: Not Currently    Alcohol/week: 3.0 standard drinks    Types: 3 Cans of beer per week  . Drug use: No  . Sexual activity: Yes  Lifestyle  . Physical activity:    Days per week: Not on file    Minutes per session: Not on file  . Stress: Not on file  Relationships  . Social connections:    Talks on phone: Not on file    Gets together: Not on file    Attends religious service: Not on file    Active member of club or organization: Not on file    Attends meetings of clubs or organizations: Not on file    Relationship status: Not on file  . Intimate partner violence:    Fear of current or ex partner: Not on file    Emotionally abused: Not on file    Physically abused: Not on file    Forced sexual activity: Not on file  Other Topics Concern  . Not on file  Social History Narrative   Lives at home w/ significant other   Right-handed   Drinks 2 cups caffeine per day    Hospitiliaztions:  None  Health Maintenance:    Flu: 02/2013  Tetanus: 03/2010  Pneumovax: 04/2015  Prevnar: 07/2015  Zostavax: never  Mammogram: > 5 years ago  Pap Smear: > 5 years ago  Bone Density: never   Colon Screening: never  Eye Doctor: as needed  Dental Exam: as needed   Providers:   PCP: Nicki Reaper, NP-C  Orthopedist: Dr. Otelia Sergeant  Cardiologist: Dr. Melburn Popper  Neurologist: Dr. Anne Hahn  Vascular: Dr. Imogene Burn   I have personally reviewed and have noted:  1. The patient's medical and social history 2. Their use of alcohol, tobacco or illicit drugs 3. Their current medications and supplements 4. The patient's functional ability including ADL's, fall risks, home safety risks and hearing or visual impairment. 5. Diet and physical activities 6. Evidence for depression or mood disorder  Subjective:   Review of Systems:   Constitutional: Denies fever, malaise, fatigue, headache or abrupt weight changes.  HEENT: Denies eye pain, eye redness, ear pain, ringing in the ears, wax buildup, runny nose, nasal congestion, bloody nose, or sore throat. Respiratory: Denies difficulty breathing, shortness of breath, cough or sputum production.   Cardiovascular: Denies chest pain, chest tightness, palpitations or swelling in the hands or feet.  Gastrointestinal: Pt reports intermittent reflux. Denies abdominal pain, bloating, constipation, diarrhea or blood in the stool.  GU: Denies urgency, frequency, pain with urination, burning sensation, blood in urine, odor or discharge. Musculoskeletal: Denies decrease in range of motion, difficulty with gait, muscle pain or joint pain and swelling.  Skin: Denies redness, rashes, lesions or ulcercations.  Neurological: Pt reports left leg numbness. Denies dizziness, difficulty with memory, difficulty with speech or problems with balance and coordination.  Psych: Pt has a history of anxiety and depression. Denies SI/HI.  No other specific complaints in a complete review of  systems (except as listed in HPI above).  Objective:  PE:   BP 130/76   Pulse (!) 55   Temp 97.9 F (36.6 C) (Oral)   Ht 5' 3.75" (1.619 m)   Wt 154 lb (69.9 kg)   LMP 01/08/2012   SpO2 98%   BMI 26.64 kg/m   Wt Readings from Last 3 Encounters:  11/18/17 155 lb (70.3 kg)  10/06/17 151 lb (68.5 kg)  07/11/17 151 lb (68.5 kg)    General: Appears her stated age, well developed, well nourished in NAD. Skin: Warm, dry and intact.  HEENT: Head: normal shape and size; Eyes: sclera white, no icterus, conjunctiva pink, PERRLA and EOMs intact; Ears: Tm's gray and intact, normal light reflex; Throat/Mouth: Teeth present, mucosa pink and moist, no exudate, lesions or ulcerations noted.  Neck: Neck supple, trachea midline. No masses, lumps or thyromegaly present.  Cardiovascular: Normal rate and rhythm. S1,S2 noted.  No murmur, rubs or gallops noted. No JVD or BLE edema. Carotid bruits noted on the left. Pulmonary/Chest: Normal effort and positive vesicular breath sounds. No respiratory distress. No wheezes, rales or ronchi noted.  Abdomen: Soft and nontender. Normal bowel sounds. No distention or masses noted. Liver, spleen and kidneys non palpable. Musculoskeletal: Strength 5/5 BUE/BLE. No difficulty with gait.  Neurological: Alert and oriented. Cranial nerves II-XII grossly intact. Coordination normal.  Psychiatric: Mood and affect normal. Behavior is normal. Judgment and thought content normal.     BMET    Component Value Date/Time   NA 138 05/27/2017 1951   K 4.9 05/27/2017 1951   CL 104 05/27/2017 1951   CO2 28 05/27/2017 1951   GLUCOSE 131 (H) 05/27/2017 1951   BUN 12 05/27/2017 1951   CREATININE 1.06 (H) 05/27/2017 1951   CREATININE 0.84 07/27/2015 1016   CALCIUM 9.4 05/27/2017 1951   GFRNONAA 51 (L) 05/27/2017 1951   GFRAA  59 (L) 05/27/2017 1951    Lipid Panel     Component Value Date/Time   CHOL 172 11/19/2016 1141   TRIG 121.0 11/19/2016 1141   HDL 59.30  11/19/2016 1141   CHOLHDL 3 11/19/2016 1141   VLDL 24.2 11/19/2016 1141   LDLCALC 88 11/19/2016 1141    CBC    Component Value Date/Time   WBC 10.5 05/27/2017 1951   RBC 3.22 (L) 05/27/2017 1951   HGB 9.9 (L) 05/27/2017 1951   HCT 30.4 (L) 05/27/2017 1951   PLT 202 05/27/2017 1951   MCV 94.4 05/27/2017 1951   MCH 30.7 05/27/2017 1951   MCHC 32.6 05/27/2017 1951   RDW 13.4 05/27/2017 1951   LYMPHSABS 1.3 05/27/2017 1951   MONOABS 1.3 (H) 05/27/2017 1951   EOSABS 0.2 05/27/2017 1951   BASOSABS 0.0 05/27/2017 1951    Hgb A1C Lab Results  Component Value Date   HGBA1C 5.8 02/21/2015      Assessment and Plan:   Medicare Annual Wellness Visit:  Diet: She does eat meat. She consumes fruits and veggies daily. She tries to avoid fried foods. She drinks mostly water, coffee. Physical activity: Sedentary Depression/mood screen: Chronic, on meds. Hearing: Intact to whispered voice Visual acuity: Grossly normal, performs annual eye exam  ADLs: Capable Fall risk: High Home safety: Good Cognitive evaluation: Intact to orientation, naming, recall and repetition EOL planning: No adv directives, DNR/ I agree  Preventative Medicine: She declines flu shot. Tetanus, pneumovax, prevnar UTD. She declines shingles vaccine, pap smear, mammogram, bone density or colon cancer screening. Encouraged her to consume a balanced diet and exercise regimen. Advised her to see an eye doctor and dentist annually. Will check CBC, CMET, Lipid and Vit D today.   Next appointment: 1 year, Medicare Wellness Exam   Nicki Reaper, NP

## 2018-01-06 NOTE — Assessment & Plan Note (Signed)
Persistent symptoms She does not want to take Myrbetriq at this time Will monitor

## 2018-01-06 NOTE — Assessment & Plan Note (Signed)
Continue Valtrex prn 

## 2018-01-06 NOTE — Patient Instructions (Signed)
Health Maintenance for Postmenopausal Women Menopause is a normal process in which your reproductive ability comes to an end. This process happens gradually over a span of months to years, usually between the ages of 22 and 9. Menopause is complete when you have missed 12 consecutive menstrual periods. It is important to talk with your health care provider about some of the most common conditions that affect postmenopausal women, such as heart disease, cancer, and bone loss (osteoporosis). Adopting a healthy lifestyle and getting preventive care can help to promote your health and wellness. Those actions can also lower your chances of developing some of these common conditions. What should I know about menopause? During menopause, you may experience a number of symptoms, such as:  Moderate-to-severe hot flashes.  Night sweats.  Decrease in sex drive.  Mood swings.  Headaches.  Tiredness.  Irritability.  Memory problems.  Insomnia.  Choosing to treat or not to treat menopausal changes is an individual decision that you make with your health care provider. What should I know about hormone replacement therapy and supplements? Hormone therapy products are effective for treating symptoms that are associated with menopause, such as hot flashes and night sweats. Hormone replacement carries certain risks, especially as you become older. If you are thinking about using estrogen or estrogen with progestin treatments, discuss the benefits and risks with your health care provider. What should I know about heart disease and stroke? Heart disease, heart attack, and stroke become more likely as you age. This may be due, in part, to the hormonal changes that your body experiences during menopause. These can affect how your body processes dietary fats, triglycerides, and cholesterol. Heart attack and stroke are both medical emergencies. There are many things that you can do to help prevent heart disease  and stroke:  Have your blood pressure checked at least every 1-2 years. High blood pressure causes heart disease and increases the risk of stroke.  If you are 53-22 years old, ask your health care provider if you should take aspirin to prevent a heart attack or a stroke.  Do not use any tobacco products, including cigarettes, chewing tobacco, or electronic cigarettes. If you need help quitting, ask your health care provider.  It is important to eat a healthy diet and maintain a healthy weight. ? Be sure to include plenty of vegetables, fruits, low-fat dairy products, and lean protein. ? Avoid eating foods that are high in solid fats, added sugars, or salt (sodium).  Get regular exercise. This is one of the most important things that you can do for your health. ? Try to exercise for at least 150 minutes each week. The type of exercise that you do should increase your heart rate and make you sweat. This is known as moderate-intensity exercise. ? Try to do strengthening exercises at least twice each week. Do these in addition to the moderate-intensity exercise.  Know your numbers.Ask your health care provider to check your cholesterol and your blood glucose. Continue to have your blood tested as directed by your health care provider.  What should I know about cancer screening? There are several types of cancer. Take the following steps to reduce your risk and to catch any cancer development as early as possible. Breast Cancer  Practice breast self-awareness. ? This means understanding how your breasts normally appear and feel. ? It also means doing regular breast self-exams. Let your health care provider know about any changes, no matter how small.  If you are 40  or older, have a clinician do a breast exam (clinical breast exam or CBE) every year. Depending on your age, family history, and medical history, it may be recommended that you also have a yearly breast X-ray (mammogram).  If you  have a family history of breast cancer, talk with your health care provider about genetic screening.  If you are at high risk for breast cancer, talk with your health care provider about having an MRI and a mammogram every year.  Breast cancer (BRCA) gene test is recommended for women who have family members with BRCA-related cancers. Results of the assessment will determine the need for genetic counseling and BRCA1 and for BRCA2 testing. BRCA-related cancers include these types: ? Breast. This occurs in males or females. ? Ovarian. ? Tubal. This may also be called fallopian tube cancer. ? Cancer of the abdominal or pelvic lining (peritoneal cancer). ? Prostate. ? Pancreatic.  Cervical, Uterine, and Ovarian Cancer Your health care provider may recommend that you be screened regularly for cancer of the pelvic organs. These include your ovaries, uterus, and vagina. This screening involves a pelvic exam, which includes checking for microscopic changes to the surface of your cervix (Pap test).  For women ages 21-65, health care providers may recommend a pelvic exam and a Pap test every three years. For women ages 79-65, they may recommend the Pap test and pelvic exam, combined with testing for human papilloma virus (HPV), every five years. Some types of HPV increase your risk of cervical cancer. Testing for HPV may also be done on women of any age who have unclear Pap test results.  Other health care providers may not recommend any screening for nonpregnant women who are considered low risk for pelvic cancer and have no symptoms. Ask your health care provider if a screening pelvic exam is right for you.  If you have had past treatment for cervical cancer or a condition that could lead to cancer, you need Pap tests and screening for cancer for at least 20 years after your treatment. If Pap tests have been discontinued for you, your risk factors (such as having a new sexual partner) need to be  reassessed to determine if you should start having screenings again. Some women have medical problems that increase the chance of getting cervical cancer. In these cases, your health care provider may recommend that you have screening and Pap tests more often.  If you have a family history of uterine cancer or ovarian cancer, talk with your health care provider about genetic screening.  If you have vaginal bleeding after reaching menopause, tell your health care provider.  There are currently no reliable tests available to screen for ovarian cancer.  Lung Cancer Lung cancer screening is recommended for adults 69-62 years old who are at high risk for lung cancer because of a history of smoking. A yearly low-dose CT scan of the lungs is recommended if you:  Currently smoke.  Have a history of at least 30 pack-years of smoking and you currently smoke or have quit within the past 15 years. A pack-year is smoking an average of one pack of cigarettes per day for one year.  Yearly screening should:  Continue until it has been 15 years since you quit.  Stop if you develop a health problem that would prevent you from having lung cancer treatment.  Colorectal Cancer  This type of cancer can be detected and can often be prevented.  Routine colorectal cancer screening usually begins at  age 42 and continues through age 45.  If you have risk factors for colon cancer, your health care provider may recommend that you be screened at an earlier age.  If you have a family history of colorectal cancer, talk with your health care provider about genetic screening.  Your health care provider may also recommend using home test kits to check for hidden blood in your stool.  A small camera at the end of a tube can be used to examine your colon directly (sigmoidoscopy or colonoscopy). This is done to check for the earliest forms of colorectal cancer.  Direct examination of the colon should be repeated every  5-10 years until age 71. However, if early forms of precancerous polyps or small growths are found or if you have a family history or genetic risk for colorectal cancer, you may need to be screened more often.  Skin Cancer  Check your skin from head to toe regularly.  Monitor any moles. Be sure to tell your health care provider: ? About any new moles or changes in moles, especially if there is a change in a mole's shape or color. ? If you have a mole that is larger than the size of a pencil eraser.  If any of your family members has a history of skin cancer, especially at a young age, talk with your health care provider about genetic screening.  Always use sunscreen. Apply sunscreen liberally and repeatedly throughout the day.  Whenever you are outside, protect yourself by wearing long sleeves, pants, a wide-brimmed hat, and sunglasses.  What should I know about osteoporosis? Osteoporosis is a condition in which bone destruction happens more quickly than new bone creation. After menopause, you may be at an increased risk for osteoporosis. To help prevent osteoporosis or the bone fractures that can happen because of osteoporosis, the following is recommended:  If you are 46-71 years old, get at least 1,000 mg of calcium and at least 600 mg of vitamin D per day.  If you are older than age 55 but younger than age 65, get at least 1,200 mg of calcium and at least 600 mg of vitamin D per day.  If you are older than age 54, get at least 1,200 mg of calcium and at least 800 mg of vitamin D per day.  Smoking and excessive alcohol intake increase the risk of osteoporosis. Eat foods that are rich in calcium and vitamin D, and do weight-bearing exercises several times each week as directed by your health care provider. What should I know about how menopause affects my mental health? Depression may occur at any age, but it is more common as you become older. Common symptoms of depression  include:  Low or sad mood.  Changes in sleep patterns.  Changes in appetite or eating patterns.  Feeling an overall lack of motivation or enjoyment of activities that you previously enjoyed.  Frequent crying spells.  Talk with your health care provider if you think that you are experiencing depression. What should I know about immunizations? It is important that you get and maintain your immunizations. These include:  Tetanus, diphtheria, and pertussis (Tdap) booster vaccine.  Influenza every year before the flu season begins.  Pneumonia vaccine.  Shingles vaccine.  Your health care provider may also recommend other immunizations. This information is not intended to replace advice given to you by your health care provider. Make sure you discuss any questions you have with your health care provider. Document Released: 03/08/2005  Document Revised: 08/04/2015 Document Reviewed: 10/18/2014 Elsevier Interactive Patient Education  2018 [employer]  

## 2018-01-06 NOTE — Assessment & Plan Note (Signed)
Deteriorated Will increase Sertraline to 100 mg daily Continue Buspar Support offered today

## 2018-01-06 NOTE — Assessment & Plan Note (Signed)
Continue Metoprolol, ASA and Atorvastatin Carotid ultrasound from 01/2017 reviewed

## 2018-01-06 NOTE — Assessment & Plan Note (Signed)
Deteriorated s/p fall She will call Dr. Otelia SergeantNitka to schedule a follow up

## 2018-01-06 NOTE — Assessment & Plan Note (Signed)
CMET and Lipid profile today Encouraged her to consume a low fat diet Continue Atorvastatin 

## 2018-01-08 NOTE — Addendum Note (Signed)
Addended by: Roena MaladyEVONTENNO, Saharah Sherrow Y on: 01/08/2018 05:03 PM   Modules accepted: Orders

## 2018-01-11 ENCOUNTER — Other Ambulatory Visit (INDEPENDENT_AMBULATORY_CARE_PROVIDER_SITE_OTHER): Payer: Self-pay | Admitting: Specialist

## 2018-01-12 ENCOUNTER — Other Ambulatory Visit (INDEPENDENT_AMBULATORY_CARE_PROVIDER_SITE_OTHER): Payer: Medicare Other

## 2018-01-12 DIAGNOSIS — E875 Hyperkalemia: Secondary | ICD-10-CM | POA: Diagnosis not present

## 2018-01-12 LAB — POTASSIUM: Potassium: 4.7 mEq/L (ref 3.5–5.1)

## 2018-01-12 NOTE — Telephone Encounter (Signed)
Rx request, please advise. °

## 2018-01-16 ENCOUNTER — Other Ambulatory Visit (INDEPENDENT_AMBULATORY_CARE_PROVIDER_SITE_OTHER): Payer: Self-pay | Admitting: Surgery

## 2018-01-16 NOTE — Telephone Encounter (Signed)
Please see below. Dr. Ophelia CharterYates approved #[phone] but wanted message cc to Dr. Otelia SergeantNitka.  Medication sent to pharmacy.

## 2018-01-16 NOTE — Telephone Encounter (Signed)
Ok for refill? 

## 2018-01-16 NOTE — Telephone Encounter (Signed)
Ok for # 30 tabs thanks . Cc to [place] thanks

## 2018-01-30 ENCOUNTER — Other Ambulatory Visit (INDEPENDENT_AMBULATORY_CARE_PROVIDER_SITE_OTHER): Payer: Self-pay | Admitting: Surgery

## 2018-01-30 NOTE — Telephone Encounter (Signed)
Can you please advise for Dr. Nitka? 

## 2018-01-31 ENCOUNTER — Other Ambulatory Visit: Payer: Self-pay | Admitting: Internal Medicine

## 2018-02-01 ENCOUNTER — Other Ambulatory Visit: Payer: Self-pay | Admitting: Internal Medicine

## 2018-02-06 ENCOUNTER — Other Ambulatory Visit (INDEPENDENT_AMBULATORY_CARE_PROVIDER_SITE_OTHER): Payer: Self-pay | Admitting: Specialist

## 2018-02-08 ENCOUNTER — Other Ambulatory Visit: Payer: Self-pay | Admitting: Internal Medicine

## 2018-02-09 NOTE — Telephone Encounter (Signed)
Gabapentin refill request 

## 2018-02-15 ENCOUNTER — Other Ambulatory Visit (INDEPENDENT_AMBULATORY_CARE_PROVIDER_SITE_OTHER): Payer: Self-pay | Admitting: Specialist

## 2018-02-15 ENCOUNTER — Other Ambulatory Visit: Payer: Self-pay | Admitting: Cardiovascular Disease

## 2018-02-16 NOTE — Telephone Encounter (Signed)
meloxicam refill request 

## 2018-02-18 ENCOUNTER — Other Ambulatory Visit: Payer: Self-pay | Admitting: Cardiovascular Disease

## 2018-02-25 ENCOUNTER — Other Ambulatory Visit: Payer: Self-pay | Admitting: Internal Medicine

## 2018-02-25 ENCOUNTER — Other Ambulatory Visit: Payer: Self-pay | Admitting: Cardiovascular Disease

## 2018-03-12 ENCOUNTER — Other Ambulatory Visit (INDEPENDENT_AMBULATORY_CARE_PROVIDER_SITE_OTHER): Payer: Self-pay | Admitting: Surgery

## 2018-03-12 NOTE — Telephone Encounter (Signed)
Methocarbamol refill request 

## 2018-03-13 ENCOUNTER — Other Ambulatory Visit: Payer: Self-pay | Admitting: Cardiovascular Disease

## 2018-03-14 ENCOUNTER — Other Ambulatory Visit: Payer: Self-pay | Admitting: Cardiovascular Disease

## 2018-03-21 ENCOUNTER — Other Ambulatory Visit: Payer: Self-pay | Admitting: Cardiovascular Disease

## 2018-03-23 ENCOUNTER — Other Ambulatory Visit: Payer: Self-pay | Admitting: Cardiovascular Disease

## 2018-03-23 ENCOUNTER — Other Ambulatory Visit (INDEPENDENT_AMBULATORY_CARE_PROVIDER_SITE_OTHER): Payer: Self-pay | Admitting: Specialist

## 2018-03-23 NOTE — Telephone Encounter (Signed)
Gabapentin refill request 

## 2018-03-25 ENCOUNTER — Encounter: Payer: Self-pay | Admitting: Physician Assistant

## 2018-03-31 ENCOUNTER — Other Ambulatory Visit: Payer: Self-pay

## 2018-03-31 NOTE — Patient Outreach (Signed)
Medication Adherence call to 03/31/2018  Heather [name] [facility] 02/03/1945 [id number]   Medication Adherence call to Heather [name] Telephone call to Patient regarding Medication Adherence unable to reach patient HIPPA Compliant Voice message left with a call back number.patient is due on Atorvastatin 40 mg under [facility] Ins.    Lillia Abed CPhT Pharmacy Technician [facility] Care Management Direct Dial [phone]  Fax [phone] [email or web address]

## 2018-04-06 ENCOUNTER — Telehealth (INDEPENDENT_AMBULATORY_CARE_PROVIDER_SITE_OTHER): Payer: Self-pay | Admitting: Specialist

## 2018-04-06 ENCOUNTER — Encounter (INDEPENDENT_AMBULATORY_CARE_PROVIDER_SITE_OTHER): Payer: Self-pay | Admitting: Specialist

## 2018-04-06 ENCOUNTER — Other Ambulatory Visit (INDEPENDENT_AMBULATORY_CARE_PROVIDER_SITE_OTHER): Payer: Self-pay | Admitting: Specialist

## 2018-04-06 ENCOUNTER — Ambulatory Visit (INDEPENDENT_AMBULATORY_CARE_PROVIDER_SITE_OTHER): Payer: Medicare Other | Admitting: Specialist

## 2018-04-06 VITALS — BP 132/51 | HR 61 | Ht 65.0 in | Wt 155.0 lb

## 2018-04-06 DIAGNOSIS — M48062 Spinal stenosis, lumbar region with neurogenic claudication: Secondary | ICD-10-CM | POA: Diagnosis not present

## 2018-04-06 MED ORDER — GABAPENTIN 300 MG PO CAPS: 300.0000 mg | ORAL_CAPSULE | Freq: Two times a day (BID) | ORAL | 0 refills | Status: DC

## 2018-04-06 MED ORDER — MAGNESIUM 30 MG PO TABS: 30.0000 mg | ORAL_TABLET | Freq: Two times a day (BID) | ORAL | 2 refills | Status: DC

## 2018-04-06 NOTE — Progress Notes (Signed)
Office Visit Note   Patient: Heather [name]           Date of Birth: 1945/09/24           MRN: [id number] Visit Date: 04/06/2018              Requested by: Lorre Munroe, NP [address] [place], [place] [address] PCP: Lorre Munroe, NP   Assessment & Plan: Visit Diagnoses:  1. Spinal stenosis of lumbar region with neurogenic claudication     Plan: Avoid frequent bending and stooping  No lifting greater than 10 lbs. May use ice or moist heat for pain. Weight loss is of benefit. Best medication for lumbar disc disease is arthritis medications like tylenol. Exercise is important to improve your indurance and does allow people to function better inspite of back pain.  Try magnesium 30 mg BID for muscle spasm.  Follow-Up Instructions: Return in about 6 months (around 10/07/2018).   Orders:  No orders of the defined types were placed in this encounter.  Meds ordered this encounter  Medications  . magnesium 30 MG tablet    Sig: Take 1 tablet (30 mg total) by mouth 2 (two) times daily.    Dispense:  28 tablet    Refill:  2  . gabapentin (NEURONTIN) 300 MG capsule    Sig: Take 1 capsule (300 mg total) by mouth 2 (two) times daily.    Dispense:  180 capsule    Refill:  0      Procedures: No procedures performed   Clinical Data: No additional findings.   Subjective: Chief Complaint  Patient presents with  . Lower Back - Follow-up    10.5 months post op from Bilateral Partial Hemilaminectomies L3-4, & L47-61     73 year old female with history of lumbar spinal stenosis. She has tireness with prolong walking, not hurting where the surgery was done but she feels whole lot better. She is wearing better shoes and no heals.    Review of Systems  Constitutional: Negative.   HENT: Negative.   Eyes: Negative.   Respiratory: Negative.   Cardiovascular: Negative.   Gastrointestinal: Negative.   Endocrine: Negative.   Genitourinary: Negative.     Musculoskeletal: Negative.   Skin: Negative.   Allergic/Immunologic: Negative.   Neurological: Negative.   Hematological: Negative.   Psychiatric/Behavioral: Negative.      Objective: Vital Signs: BP (!) 132/51 (BP Location: Left Arm, Patient Position: Sitting)   Pulse 61   Ht  (1.651 m)   Wt 155 lb (70.3 kg)   LMP 01/08/2012   BMI 25.79 kg/m   Physical Exam Constitutional:      Appearance: She is well-developed.  HENT:     Head: Normocephalic and atraumatic.  Eyes:     Pupils: Pupils are equal, round, and reactive to light.  Neck:     Musculoskeletal: Normal range of motion and neck supple.  Pulmonary:     Effort: Pulmonary effort is normal.     Breath sounds: Normal breath sounds.  Abdominal:     General: Bowel sounds are normal.     Palpations: Abdomen is soft.  Skin:    General: Skin is warm and dry.  Neurological:     Mental Status: She is [name] and oriented to person, place, and time.  Psychiatric:        Behavior: Behavior normal.        Thought Content: Thought content normal.  Judgment: Judgment normal.     Back Exam   Tenderness  The patient is experiencing tenderness in the lumbar.  Range of Motion  Extension: abnormal  Flexion: normal  Lateral bend right: normal  Lateral bend left: normal  Rotation right: normal  Rotation left: normal   Muscle Strength  Right Quadriceps:  5/5  Left Quadriceps:  5/5  Right Hamstrings:  5/5  Left Hamstrings:  5/5   Tests  Straight leg raise right: negative Straight leg raise left: negative  Reflexes  Patellar: normal Achilles: normal Biceps: normal Babinski's sign: normal   Other  Toe walk: normal Heel walk: normal Sensation: normal Gait: normal  Erythema: no back redness Scars: absent      Specialty Comments:  No specialty comments available.  Imaging: No results found.   PMFS History: Patient Active Problem List   Diagnosis Date Noted  . OAB (overactive bladder)  11/18/2017  . Hypertension 12/17/2016  . Lumbar stenosis with neurogenic claudication 07/03/2016  . Carotid disease, bilateral (HCC) 03/03/2015  . Chronic back pain 03/03/2014  . Seasonal allergies 03/03/2014  . Genital herpes 03/03/2014  . Hyperlipidemia 03/03/2013  . Anxiety and depression 12/11/2011   Past Medical History:  Diagnosis Date  . Anxiety   . Arthritis    "knees, right hip, lumbar back" (12/17/2016)  . Chicken pox   . Chronic lower back pain   . DDD (degenerative disc disease), lumbar   . Depression   . Headache 06/23/2015   "2d after carotid OR & again today" (12/17/2016)  . Heart murmur    MVP  . High cholesterol   . History of blood transfusion 1975   "related to femur  fracture" (12/17/2016)  . HOH (hard of hearing)    left  . Hypertension   . MVP (mitral valve prolapse)    echo 1986-mild  . 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 Shortness of breath dyspnea    WITH EXERTION    . Spinal stenosis of lumbar region   . Stroke [facility]) 04/2015   "light stroke; sometimes my thinking is slow since" (12/17/2016)  . Wears glasses     Family History  Adopted: Yes  Problem Relation Age of Onset  . Osteoporosis Mother   . Heart disease Mother   . Hypertension Mother   . Hyperlipidemia Mother   . Stroke Mother   . Arthritis Mother   . Diabetes Father   . Heart attack Father   . Cancer Sister        breast  . Colon cancer Neg Hx     Past Surgical History:  Procedure Laterality Date  . CARPAL TUNNEL RELEASE Right 03/31/2013   Procedure: RIGHT CARPAL TUNNEL RELEASE;  Surgeon: Nicki Reaper, MD;  Location: [facility];  Service: Orthopedics;  Laterality: Right;  . ENDARTERECTOMY Right 05/03/2015   Procedure: RIGHT CAROTID ENDARTERECTOMY ;  Surgeon: Fransisco Hertz, MD;  Location: [facility] OR;  Service: Vascular;  Laterality: Right;  . FEMUR FRACTURE SURGERY Right 1975    "hip"  . FRACTURE SURGERY    . LUMBAR LAMINECTOMY N/A 05/26/2017   Procedure: BILATERAL PARTIAL HEMILAMINECTOMIES L3-4 AND L4-5;  Surgeon: Kerrin Champagne, MD;  Location: [facility] OR;  Service: Orthopedics;  Laterality: N/A;  . NASAL SINUS SURGERY  1975  . PLANTAR FASCIA RELEASE Bilateral 1968  . SHOULDER OPEN ROTATOR CUFF REPAIR Right 1998  . TONSILLECTOMY  1951  .  TUBAL LIGATION  1995   Social History   Occupational History  . Occupation: Retired  Tobacco Use  . Smoking status: Never Smoker  . Smokeless tobacco: Never Used  Substance and Sexual Activity  . Alcohol use: Not Currently    Alcohol/week: 3.0 standard drinks    Types: 3 Cans of beer per week  . Drug use: No  . Sexual activity: Yes

## 2018-04-06 NOTE — Telephone Encounter (Signed)
Please review order.

## 2018-04-06 NOTE — Patient Instructions (Signed)
Plan: Avoid frequent bending and stooping  No lifting greater than 10 lbs. May use ice or moist heat for pain. Weight loss is of benefit. Best medication for lumbar disc disease is arthritis medications like tylenol. Exercise is important to improve your indurance and does allow people to function better inspite of back pain.  Try magnesium 30 mg BID for muscle spasm.

## 2018-04-06 NOTE — Telephone Encounter (Signed)
This info was given to Dr. Otelia Sergeant

## 2018-04-06 NOTE — Telephone Encounter (Signed)
Patient called to give Dr. Otelia Sergeant this name and number for the sheet metal.  She said Dr. Otelia Sergeant would know what she is talking about.  The number is [phone].  Thank you.

## 2018-04-07 ENCOUNTER — Other Ambulatory Visit (INDEPENDENT_AMBULATORY_CARE_PROVIDER_SITE_OTHER): Payer: Self-pay | Admitting: Surgery

## 2018-04-07 NOTE — Telephone Encounter (Signed)
Methocarbamol refill request 

## 2018-04-08 ENCOUNTER — Telehealth (INDEPENDENT_AMBULATORY_CARE_PROVIDER_SITE_OTHER): Payer: Self-pay | Admitting: Specialist

## 2018-04-08 ENCOUNTER — Other Ambulatory Visit (INDEPENDENT_AMBULATORY_CARE_PROVIDER_SITE_OTHER): Payer: Self-pay | Admitting: Radiology

## 2018-04-08 MED ORDER — GABAPENTIN 300 MG PO CAPS: 300.0000 mg | ORAL_CAPSULE | Freq: Two times a day (BID) | ORAL | 0 refills | Status: DC

## 2018-04-08 NOTE — Addendum Note (Signed)
Addended by: Penne Lash, Otis Dials on: 04/08/2018 10:37 AM   Modules accepted: Orders

## 2018-04-08 NOTE — Telephone Encounter (Signed)
I sent request to Dr. Nitka 

## 2018-04-08 NOTE — Telephone Encounter (Signed)
Patient called and requested that her [facility] [place].  CB#[phone].  Thank you.

## 2018-04-08 NOTE — Addendum Note (Signed)
Addended by: Penne Lash, Neysa Bonito N on: 04/08/2018 10:40 AM   Modules accepted: Orders

## 2018-04-10 ENCOUNTER — Other Ambulatory Visit: Payer: Self-pay

## 2018-04-10 ENCOUNTER — Telehealth: Payer: Self-pay | Admitting: Cardiovascular Disease

## 2018-04-10 ENCOUNTER — Other Ambulatory Visit: Payer: Self-pay | Admitting: Cardiovascular Disease

## 2018-04-10 NOTE — Telephone Encounter (Signed)
Outpatient Medication Detail    Disp Refills Start End   atorvastatin (LIPITOR) 40 MG tablet 30 tablet 0 04/10/2018    Sig: TAKE 1 TABLET (40 MG TOTAL) BY MOUTH DAILY AT 6 PM. PLEASE MAKE OVERDUE APPT FOR MORE REFILLS   Sent to pharmacy as: atorvastatin (LIPITOR) 40 MG tablet   E-Prescribing Status: Receipt confirmed by pharmacy (04/10/2018 10:20 AM EDT)   Pharmacy   [id number] [name],  - [address]

## 2018-04-10 NOTE — Patient Outreach (Signed)
[facility]) Care Management  04/10/2018  Raniah Maron [facility] 11/08/1945 [id number]   Medication Adherence call to Mrs. Nathaneil Canary spoke with patient she is due on Atorvastatin 40 mg patient needs an appointment and have already schedule for next Tuesday, left a message at doctor's office to call in a refill  for a 90 days supply for a better adherence. Mrs. Goslin is showing past due under [facility] Ins.    Lillia Abed CPhT Pharmacy Technician [facility] Care Management Direct Dial [phone]  Fax [phone] [email or web address] [email or web address]

## 2018-04-10 NOTE — Telephone Encounter (Signed)
°*  STAT* If patient is at the pharmacy, call can be transferred to refill team.   1. Which medications need to be refilled? (please list name of each medication and dose if known) atorvastatin (LIPITOR) 40 MG tablet  2. Which pharmacy/location (including street and city if local pharmacy) is medication to be sent to? [id number] [name], [place] - [address]  3. Do they need a 30 day or 90 day supply? 90 days

## 2018-04-12 ENCOUNTER — Other Ambulatory Visit: Payer: Self-pay | Admitting: Cardiovascular Disease

## 2018-04-14 ENCOUNTER — Ambulatory Visit: Payer: Medicare Other | Admitting: Physician Assistant

## 2018-04-18 ENCOUNTER — Other Ambulatory Visit: Payer: Self-pay | Admitting: Cardiovascular Disease

## 2018-04-24 ENCOUNTER — Other Ambulatory Visit: Payer: Self-pay | Admitting: Cardiovascular Disease

## 2018-04-24 MED ORDER — METOPROLOL TARTRATE 25 MG PO TABS: ORAL_TABLET | ORAL | 1 refills | Status: DC

## 2018-04-24 NOTE — Addendum Note (Signed)
Addended by: Demetrios Loll on: 04/24/2018 03:04 PM   Modules accepted: Orders

## 2018-05-01 ENCOUNTER — Other Ambulatory Visit: Payer: Self-pay | Admitting: Cardiovascular Disease

## 2018-05-01 ENCOUNTER — Other Ambulatory Visit: Payer: Self-pay | Admitting: Obstetrics & Gynecology

## 2018-05-02 ENCOUNTER — Other Ambulatory Visit: Payer: Self-pay | Admitting: Cardiovascular Disease

## 2018-05-03 ENCOUNTER — Other Ambulatory Visit: Payer: Self-pay | Admitting: Internal Medicine

## 2018-05-07 ENCOUNTER — Other Ambulatory Visit (INDEPENDENT_AMBULATORY_CARE_PROVIDER_SITE_OTHER): Payer: Self-pay | Admitting: Surgery

## 2018-05-07 NOTE — Telephone Encounter (Signed)
Ok to rf? 

## 2018-05-18 ENCOUNTER — Other Ambulatory Visit (INDEPENDENT_AMBULATORY_CARE_PROVIDER_SITE_OTHER): Payer: Self-pay | Admitting: Specialist

## 2018-05-18 MED ORDER — METHOCARBAMOL 500 MG PO TABS: ORAL_TABLET | ORAL | 1 refills | Status: DC

## 2018-05-18 NOTE — Telephone Encounter (Signed)
Request sent to Dr. Nitka  

## 2018-05-18 NOTE — Telephone Encounter (Signed)
Patient called her pharmacy to get a refill on Methocarbamol and she did not receive a response from our office.  She is f/u on that request.  CB#[phone].  Thank you.

## 2018-06-25 ENCOUNTER — Other Ambulatory Visit (INDEPENDENT_AMBULATORY_CARE_PROVIDER_SITE_OTHER): Payer: Self-pay | Admitting: Specialist

## 2018-06-25 ENCOUNTER — Other Ambulatory Visit: Payer: Self-pay | Admitting: Cardiovascular Disease

## 2018-06-25 NOTE — Telephone Encounter (Signed)
Gabapentin refill request 

## 2018-07-13 ENCOUNTER — Other Ambulatory Visit: Payer: Self-pay | Admitting: Specialist

## 2018-07-13 NOTE — Telephone Encounter (Signed)
Pt called in requesting a refill on her medication for Robaxin.  [phone]

## 2018-07-14 MED ORDER — METHOCARBAMOL 500 MG PO TABS: ORAL_TABLET | ORAL | 1 refills | Status: DC

## 2018-07-23 ENCOUNTER — Other Ambulatory Visit: Payer: Self-pay | Admitting: Specialist

## 2018-07-23 ENCOUNTER — Other Ambulatory Visit: Payer: Self-pay | Admitting: Obstetrics & Gynecology

## 2018-07-23 MED ORDER — GABAPENTIN 300 MG PO CAPS: 300.0000 mg | ORAL_CAPSULE | Freq: Two times a day (BID) | ORAL | 4 refills | Status: DC

## 2018-07-23 NOTE — Telephone Encounter (Signed)
Patient called and stated that she needed refill of Gabapentin.

## 2018-07-23 NOTE — Telephone Encounter (Signed)
Sent rx request to Dr. Nitka 

## 2018-07-29 ENCOUNTER — Other Ambulatory Visit: Payer: Self-pay | Admitting: Cardiovascular Disease

## 2018-07-30 ENCOUNTER — Other Ambulatory Visit: Payer: Self-pay | Admitting: Internal Medicine

## 2018-08-11 ENCOUNTER — Other Ambulatory Visit: Payer: Self-pay | Admitting: Specialist

## 2018-08-11 MED ORDER — METHOCARBAMOL 500 MG PO TABS: ORAL_TABLET | ORAL | 1 refills | Status: DC

## 2018-08-11 NOTE — Telephone Encounter (Signed)
Patient called for a refill for Robaxin  Please call patient [phone]

## 2018-08-11 NOTE — Telephone Encounter (Signed)
Sent request to Dr. Nitka 

## 2018-09-04 ENCOUNTER — Telehealth: Payer: Self-pay | Admitting: Specialist

## 2018-09-04 NOTE — Telephone Encounter (Signed)
Pt called in requesting refill on Robaxin 500MG  and would like that sent to [facility] [place]

## 2018-09-04 NOTE — Telephone Encounter (Signed)
Please advise on message below. Thank you!

## 2018-09-11 ENCOUNTER — Other Ambulatory Visit: Payer: Self-pay | Admitting: Specialist

## 2018-09-11 MED ORDER — METHOCARBAMOL 500 MG PO TABS: ORAL_TABLET | ORAL | 1 refills | Status: DC

## 2018-09-11 NOTE — Telephone Encounter (Signed)
Patient left a message in regards to refill request from 09/04/18.  She has not heard anything from [place] or the pharmacy.  CB#[phone].  Thank you.

## 2018-09-11 NOTE — Telephone Encounter (Signed)
Sent request to Dr. Nitka 

## 2018-09-16 ENCOUNTER — Telehealth: Payer: Self-pay

## 2018-09-16 NOTE — Progress Notes (Signed)
Virtual Visit via Telephone Note   This visit type was conducted due to national recommendations for restrictions regarding the COVID-19 Pandemic (e.g. social distancing) in an effort to limit this patient's exposure and mitigate transmission in our community.  Due to her co-morbid illnesses, this patient is at least at moderate risk for complications without adequate follow up.  This format is felt to be most appropriate for this patient at this time.  The patient did not have access to video technology/had technical difficulties with video requiring transitioning to audio format only (telephone).  All issues noted in this document were discussed and addressed.  No physical exam could be performed with this format.  Please refer to the patient's chart for her  consent to telehealth for [facility].   Date:  09/17/2018   ID:  Heather ReichertMary [name] [name], DOB 1945/06/04, MRN [id number]  Patient Location: Home Provider Location: Home  PCP:  [name], Regina W, NP  Cardiologist:  Kristeen MissPhilip Oluwanifemi Petitti, MD  Electrophysiologist:  None   Problem List  1. Essential HTN 2 Carotid artery disease 3. Hyperlipidemia     Feb. 8, 2017 Heather AlertMary [name] [name] is a 73 y.o. female who presents for pre op evaluation prior to having carotid artery endarterectomy She has a history of mitral valve prolapse and has previously seen Dr. Aleen Campiysinger. She's not seen a cardiologist for years. She has had some tachypalpitations in the past.  She's had some hyperlipidemia. She also has hypertension. She does not take her BP at home but thinks todays elevated BP is more due to white coat HTN  She has had some exertional chest tightness -  Last for several minutes ( or until she stops walking )  Has been going on for several months   Eats lots of processed foods and fast foods.     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  She has a history of hyperlipidemia. We started atorvastatin 40 mg a day. Her last lipid levels look very good.  Dec. 7, 2017:  Heather DandyMary is doing ok. Having back pain .   Was supposed to have back surgery earlier this year - had urgent carotid surgery instead ( and had a CVA as a complication of that )  Has some dyspnea.   Thinks its allergies.   February 05, 2016: Heather DandyMary is seen today for follow-up of a recent emergency room visit.  I saw Heather DandyMary in 2017 for preoperative carotid surgery.  She presented to the emergency room in November, 2018 with increasing shortness of breath and some chest discomfort. Is better from a respiratory / cardiology standpoint .   Needs to have back surgery.   Waiting on clearance from Dr. Imogene Burnhen   She has a history of bilateral carotid artery disease.  She is status post right carotid endarterectomy in April, 2017.  Echo in Nov. 2018 showed normal LV function    Evaluation Performed:  Follow-Up Visit  Chief Complaint:  Hyperlipidemia.  Aug. 20, 2020    Heather [name] is a 73 y.o. female with  Hx of carotid artery disease, HTN She is s/p carotid endarterectomy   No cardiac issues. Has had back surgery  - still getting over the surgery  VS look good this am No cp or dyspnea.  Has lots of allergies Has not been exercising   Has had CEA,  Follows up with [facility].     The patient does not have symptoms concerning for COVID-19 infection (fever, chills, cough, or new shortness of breath).    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   Orogenital transmission (husband had cold sore; HSV1 culture positive)   Shortness of breath dyspnea    WITH EXERTION     Spinal stenosis of lumbar region    Stroke [facility]) 04/2015   "light stroke; sometimes my thinking is slow since" (12/17/2016)   Wears glasses    Past Surgical History:  Procedure Laterality Date   CARPAL TUNNEL RELEASE Right 03/31/2013   Procedure: RIGHT CARPAL TUNNEL RELEASE;  Surgeon: Wynonia Sours, MD;  Location: [place];  Service: Orthopedics;  Laterality: Right;   ENDARTERECTOMY Right 05/03/2015   Procedure: RIGHT CAROTID ENDARTERECTOMY ;  Surgeon: Conrad McLean, MD;  Location: [place];  Service: Vascular;  Laterality: Right;   FEMUR FRACTURE SURGERY Right 1975   "hip"   FRACTURE SURGERY     LUMBAR LAMINECTOMY N/A 05/26/2017   Procedure: BILATERAL PARTIAL HEMILAMINECTOMIES L3-4 AND L4-5;  Surgeon: Jessy Oto, MD;  Location: [place];  Service: Orthopedics;  Laterality: N/A;   NASAL SINUS SURGERY  1975   PLANTAR FASCIA RELEASE Bilateral 1968   SHOULDER OPEN ROTATOR CUFF REPAIR Right 1998   TONSILLECTOMY  1951   TUBAL LIGATION  1995     Current Meds  Medication Sig   Ascorbic Acid (VITAMIN C) 1000 MG tablet Take 1,000 mg by mouth daily.   aspirin EC 81 MG tablet Take 1 tablet (81 mg total) by mouth daily.   atorvastatin (LIPITOR) 40 MG tablet Take 1 tablet (40 mg total) by mouth daily at 6 PM. Please keep upcoming appt for future refills. Thank you   busPIRone (BUSPAR) 7.5 MG tablet Take 1 tablet (7.5 mg total) by mouth 2 (two) times daily.   Cholecalciferol (VITAMIN D3) 1000 UNITS CAPS Take 1,000 Units by mouth daily.    docusate sodium (COLACE) 100 MG capsule TAKE 1 CAPSULE BY MOUTH TWICE A DAY   gabapentin (NEURONTIN) 300 MG capsule Take 1  capsule (300 mg total) by mouth 2 (two) times daily.   lidocaine (XYLOCAINE) 2 % jelly Apply 1 application topically as needed. Apply to the skin over the knees and hips up to BID (Patient taking differently: Apply 1 application topically 2 (two) times daily as needed (FOR KNEE/HIP PAIN). Apply to the skin over the knees and hips up to BID)   loratadine (CLARITIN) 10 MG tablet Take 10 mg by mouth daily.     losartan (COZAAR) 100 MG tablet TAKE 1 TABLET BY MOUTH EVERY DAY   meloxicam (MOBIC) 15 MG tablet TAKE 1 TABLET BY MOUTH  EVERY DAY   methocarbamol (ROBAXIN) 500 MG tablet TAKE 1 TABLET BY MOUTH EVERY 8 HOURS AS NEEDED FOR MUSCLE SPASM   metoprolol tartrate (LOPRESSOR) 25 MG tablet TAKE 1 TABLET BY MOUTH TWICE DAILY, *PLEASE MAKE OVERDUE APPT FOR MORE REFILLS*   Multiple Vitamin (MULTIVITAMIN) tablet Take 1 tablet by mouth daily.     sertraline (ZOLOFT) 100 MG tablet TAKE 1 AND 1/2 TABLETS BY MOUTH EVERY DAY   sertraline (ZOLOFT) 50 MG tablet TAKE 2 TABLETS BY MOUTH EVERY DAY   valACYclovir (VALTREX) 500 MG tablet TAKE 1 TABLET BY MOUTH DAILY**CAN INCREASE TO 2 TABS FOR 5 DAY IN THE EVENT OF RECURRENCE**     Allergies:   Sulfamethoxazole   Social History   Tobacco Use   Smoking status: Never Smoker   Smokeless tobacco: Never Used  Substance Use Topics   Alcohol use: Not Currently    Alcohol/week: 3.0 standard drinks    Types: 3 Cans of beer per week   Drug use: No     Family Hx: The patient's family history includes Arthritis in her mother; Cancer in her sister; Diabetes in her father; Heart attack in her father; Heart disease in her mother; Hyperlipidemia in her mother; Hypertension in her mother; Osteoporosis in her mother; Stroke in her mother. There is no history of Colon cancer. She was adopted.  ROS:   Please see the history of present illness.     All other systems reviewed and are negative.   Prior CV studies:   The following studies were reviewed  today:    Labs/Other Tests and Data Reviewed:    EKG:  No ECG reviewed.  Recent Labs: 01/06/2018: ALT 21; BUN 23; Creatinine, Ser 1.00; Hemoglobin 13.1; Platelets 265.0; Sodium 141 01/12/2018: Potassium 4.7   Recent Lipid Panel Lab Results  Component Value Date/Time   CHOL 165 01/06/2018 11:09 AM   TRIG 100.0 01/06/2018 11:09 AM   HDL 67.20 01/06/2018 11:09 AM   CHOLHDL 2 01/06/2018 11:09 AM   LDLCALC 78 01/06/2018 11:09 AM   LDLDIRECT 132.0 02/21/2015 01:31 PM    Wt Readings from Last 3 Encounters:  09/17/18 156 lb (70.8 kg)  04/06/18 155 lb (70.3 kg)  01/06/18 154 lb (69.9 kg)     Objective:    Vital Signs:  BP (!) 132/51 (BP Location: Left Arm, Patient Position: Sitting, Cuff Size: Normal)    Pulse 61    Ht 5\' 5"  (1.651 m)    Wt 156 lb (70.8 kg)    LMP 01/08/2012    BMI 25.96 kg/m      ASSESSMENT & PLAN:    1. Hyperlipidemia:   Cont. Atorvastatin  2.  Carotid artery disease :   S/p CEA.  Continue lipior.    3.  HTN:   BP is well controlled.   Advised salt restriction and continuation of meds.    COVID-19 Education: The signs and symptoms of COVID-19 were discussed with the patient and how to seek care for testing (follow up with PCP or arrange E-visit).  The importance of social distancing was discussed today.  Time:   Today, I have spent  15  minutes with the patient with telehealth technology discussing the above problems.     Medication Adjustments/Labs and Tests Ordered: Current medicines are reviewed at length with the patient today.  Concerns regarding medicines are outlined above.   Tests Ordered: No orders of the defined types were placed in this encounter.   Medication Changes: No orders  of the defined types were placed in this encounter.   Follow Up:  In Person in 1 year(s)  Signed, Kristeen MissPhilip Desani Sprung, MD  09/17/2018 10:08 AM    [facility]

## 2018-09-16 NOTE — Telephone Encounter (Signed)
Virtual [name] Pre-Appointment Phone Call  "(Name), I am calling you today to discuss your upcoming appointment. We are currently trying to limit exposure to the virus that causes COVID-19 by seeing patients at home rather than in the office."  1. "What is the BEST phone number to call the day of the [name]?" - include this in appointment notes  2. "Do you have or have access to (through a family member/friend) a smartphone with video capability that we can use for your [name]?" a. If yes - list this number in appt notes as "cell" (if different from BEST phone #) and list the appointment type as a VIDEO [name] in appointment notes b. If no - list the appointment type as a PHONE [name] in appointment notes  3. Confirm consent - "In the setting of the current Covid19 crisis, you are scheduled for a (phone or video) [name] with your provider on (date) at (time).  Just as we do with many in-office visits, in order for you to participate in this [name], we must obtain consent.  If you'd like, I can send this to your mychart (if signed up) or email for you to review.  Otherwise, I can obtain your verbal consent now.  All virtual visits are billed to your insurance company just like a normal [name] would be.  By agreeing to a virtual [name], we'd like you to understand that the technology does not allow for your provider to perform an examination, and thus may limit your provider's ability to fully assess your condition. If your provider identifies any concerns that need to be evaluated in person, we will make arrangements to do so.  Finally, though the technology is pretty good, we cannot assure that it will always work on either your or our end, and in the setting of a video [name], we may have to convert it to a phone-only [name].  In either situation, we cannot ensure that we have a secure connection.  Are you willing to proceed?" STAFF: Did the patient verbally acknowledge consent to telehealth [name]? Document  YES/NO here: YES  4. Advise patient to be prepared - "Two hours prior to your appointment, go ahead and check your blood pressure, pulse, oxygen saturation, and your weight (if you have the equipment to check those) and write them all down. When your [name] starts, your provider will ask you for this information. If you have an Apple Watch or Kardia device, please plan to have heart rate information ready on the day of your appointment. Please have a pen and paper handy nearby the day of the [name] as well."  5. Give patient instructions for MyChart download to smartphone OR Doximity/Doxy.me as below if video [name] (depending on what platform provider is using)  6. Inform patient they will receive a phone call 15 minutes prior to their appointment time (may be from unknown caller ID) so they should be prepared to answer    TELEPHONE CALL NOTE  Heather [name] to limit community exposure during the Covid-19 pandemic. I spoke with the patient via phone to ensure availability of phone/video source, confirm preferred email & phone number, and discuss instructions and expectations.  I reminded Heather [name] to be prepared with any vital sign and/or heart rhythm information that could potentially be obtained via home monitoring, at the time of her [name]. I reminded Heather [name] to expect a phone call prior to  her [name].  Giovanna Kemmerer, [facility] 09/16/2018 11:25 AM   INSTRUCTIONS FOR DOWNLOADING THE MYCHART APP TO SMARTPHONE  - The patient must first make sure to have activated MyChart and know their login information - If Apple, go to [employer] and type in MyChart in the search bar and download the app. If Android, ask patient to go to [employer] and type in [facility] in the search bar and download the app. The app is free but as with any other app downloads, their phone may require them to verify saved payment information or Apple/Android  password.  - The patient will need to then log into the app with their MyChart username and password, and select [facility] as their healthcare provider to link the account. When it is time for your [name], go to the MyChart app, find appointments, and click Begin Video [name]. Be sure to Select Allow for your device to access the Microphone and Camera for your [name]. You will then be connected, and your provider will be with you shortly.  **If they have any issues connecting, or need assistance please contact MyChart service desk [phone])**  **If using a computer, in order to ensure the best quality for their [name] they will need to use either of the following Internet Browsers: [employer], or Google Chrome**  IF USING DOXIMITY or DOXY.ME - The patient will receive a link just prior to their [name] by text.     FULL LENGTH CONSENT FOR TELE-HEALTH [name]   I hereby voluntarily request, consent and authorize [facility] and its employed or contracted physicians, physician assistants, nurse practitioners or other licensed health care professionals (the Practitioner), to provide me with telemedicine health care services (the "Services") as deemed necessary by the treating Practitioner. I acknowledge and consent to receive the Services by the Practitioner via telemedicine. I understand that the telemedicine [name] will involve communicating with the Practitioner through live audiovisual communication technology and the disclosure of certain medical information by electronic transmission. I acknowledge that I have been given the opportunity to request an in-person assessment or other available alternative prior to the telemedicine [name] and am voluntarily participating in the telemedicine [name].  I understand that I have the right to withhold or withdraw my consent to the use of telemedicine in the course of my care at any time, without affecting my right to future care or treatment,  and that the Practitioner or I may terminate the telemedicine [name] at any time. I understand that I have the right to inspect all information obtained and/or recorded in the course of the telemedicine [name] and may receive copies of available information for a reasonable fee.  I understand that some of the potential risks of receiving the Services via telemedicine include:  Marland Kitchen Delay or interruption in medical evaluation due to technological equipment failure or disruption; . Information transmitted may not be sufficient (e.g. poor resolution of images) to allow for appropriate medical decision making by the Practitioner; and/or  . In rare instances, security protocols could fail, causing a breach of personal health information.  Furthermore, I acknowledge that it is my responsibility to provide information about my medical history, conditions and care that is complete and accurate to the best of my ability. I acknowledge that Practitioner's advice, recommendations, and/or decision may be based on factors not within their control, such as incomplete or inaccurate data provided by me or distortions of diagnostic images or specimens that may result from electronic transmissions. I  understand that the practice of medicine is not an exact science and that Practitioner makes no warranties or guarantees regarding treatment outcomes. I acknowledge that I will receive a copy of this consent concurrently upon execution via email to the email address I last provided but may also request a printed copy by calling the office of [facility].    I understand that my insurance will be billed for this [name].   I have read or had this consent read to me. . I understand the contents of this consent, which adequately explains the benefits and risks of the Services being provided via telemedicine.  . I have been provided ample opportunity to ask questions regarding this consent and the Services and have had my questions  answered to my satisfaction. . I give my informed consent for the services to be provided through the use of telemedicine in my medical care  By participating in this telemedicine [name] I agree to the above.

## 2018-09-17 ENCOUNTER — Telehealth: Payer: Self-pay | Admitting: Cardiovascular Disease

## 2018-09-17 ENCOUNTER — Other Ambulatory Visit: Payer: Self-pay

## 2018-09-17 ENCOUNTER — Telehealth (INDEPENDENT_AMBULATORY_CARE_PROVIDER_SITE_OTHER): Payer: Medicare Other | Admitting: Cardiovascular Disease

## 2018-09-17 VITALS — BP 132/51 | HR 61 | Ht 65.0 in | Wt 156.0 lb

## 2018-09-17 DIAGNOSIS — I6523 Occlusion and stenosis of bilateral carotid arteries: Secondary | ICD-10-CM

## 2018-09-17 DIAGNOSIS — I10 Essential (primary) hypertension: Secondary | ICD-10-CM

## 2018-09-17 DIAGNOSIS — E782 Mixed hyperlipidemia: Secondary | ICD-10-CM

## 2018-09-17 DIAGNOSIS — E7800 Pure hypercholesterolemia, unspecified: Secondary | ICD-10-CM

## 2018-09-17 MED ORDER — LOSARTAN POTASSIUM 100 MG PO TABS: 100.0000 mg | ORAL_TABLET | Freq: Every day | ORAL | 3 refills | Status: DC

## 2018-09-17 MED ORDER — ATORVASTATIN CALCIUM 40 MG PO TABS: 40.0000 mg | ORAL_TABLET | Freq: Every day | ORAL | 3 refills | Status: DC

## 2018-09-17 MED ORDER — METOPROLOL TARTRATE 25 MG PO TABS: 25.0000 mg | ORAL_TABLET | Freq: Two times a day (BID) | ORAL | 3 refills | Status: DC

## 2018-09-17 NOTE — Patient Instructions (Signed)
Medication Instructions:  Your physician recommends that you continue on your current medications as directed. Please refer to the Current Medication list given to you today.  Refills of your cardiac medications have been sent to your pharmacy   Lab work: Your physician recommends that you return for lab work in: 12 months on the day of or a few days before your office visit with Dr. Nahser.  You will need to FAST for this appointment - nothing to eat or drink after midnight the night before except water.    Testing/Procedures: None Ordered   Follow-Up: At [facility], you and your health needs are our priority.  As part of our continuing mission to provide you with exceptional heart care, we have created designated Provider Care Teams.  These Care Teams include your primary Cardiologist (physician) and Advanced Practice Providers (APPs -  Physician Assistants and Nurse Practitioners) who all work together to provide you with the care you need, when you need it. You will need a follow up appointment in:  1 years.  Please call our office 2 months in advance to schedule this appointment.  You may see Philip Nahser, MD or one of the following Advanced Practice Providers on your designated Care Team: [name] Weaver, PA-C Vin Bhagat, PA-C . Janine Hammond, NP   

## 2018-09-17 NOTE — Telephone Encounter (Signed)
Dr. Acie Fredrickson completed the patient's virtual visit

## 2018-09-17 NOTE — Telephone Encounter (Signed)
New Message     Pt says she was on hold for her virtual visit and the phone got disconnected   Please call

## 2018-10-07 ENCOUNTER — Ambulatory Visit: Payer: Self-pay

## 2018-10-07 ENCOUNTER — Encounter: Payer: Self-pay | Admitting: Specialist

## 2018-10-07 ENCOUNTER — Ambulatory Visit (INDEPENDENT_AMBULATORY_CARE_PROVIDER_SITE_OTHER): Payer: Medicare Other | Admitting: Specialist

## 2018-10-07 VITALS — BP 155/61 | HR 56 | Ht 65.0 in | Wt 155.0 lb

## 2018-10-07 DIAGNOSIS — M4316 Spondylolisthesis, lumbar region: Secondary | ICD-10-CM

## 2018-10-07 DIAGNOSIS — M48062 Spinal stenosis, lumbar region with neurogenic claudication: Secondary | ICD-10-CM

## 2018-10-07 DIAGNOSIS — M5136 Other intervertebral disc degeneration, lumbar region: Secondary | ICD-10-CM

## 2018-10-07 DIAGNOSIS — I739 Peripheral vascular disease, unspecified: Secondary | ICD-10-CM

## 2018-10-07 DIAGNOSIS — Z9889 Other specified postprocedural states: Secondary | ICD-10-CM

## 2018-10-07 DIAGNOSIS — Z8679 Personal history of other diseases of the circulatory system: Secondary | ICD-10-CM

## 2018-10-07 DIAGNOSIS — M4807 Spinal stenosis, lumbosacral region: Secondary | ICD-10-CM | POA: Diagnosis not present

## 2018-10-07 DIAGNOSIS — M4156 Other secondary scoliosis, lumbar region: Secondary | ICD-10-CM

## 2018-10-07 NOTE — Patient Instructions (Signed)
Avoid bending, stooping and avoid lifting weights greater than 10 lbs. Avoid prolong standing and walking. Avoid frequent bending and stooping  No lifting greater than 10 lbs. May use ice or moist heat for pain. Weight loss is of benefit. Handicap license is approved.  

## 2018-10-07 NOTE — Progress Notes (Signed)
Office Visit Note   Patient: Heather [name]           Date of Birth: August 10, 1945           MRN: [id number] Visit Date: 10/07/2018              RequestRebeca Alerted by: [name], Regina W, NP [address] [name],  [place] [id number] PCP: [name], Regina W, NP   Assessment & Plan: Visit Diagnoses:  1. Status post lumbar laminectomy   2. Spinal stenosis of lumbosacral region   3. Spinal stenosis of lumbar region with neurogenic claudication   4. Spondylolisthesis, lumbar region   5. Other secondary scoliosis, lumbar region   6. Degenerative disc disease, lumbar   7. History of ASCVD (atherosclerotic cardiovascular disease)     Plan: Avoid bending, stooping and avoid lifting weights greater than 10 lbs. Avoid prolong standing and walking. Avoid frequent bending and stooping  No lifting greater than 10 lbs. May use ice or moist heat for pain. Weight loss is of benefit. Handicap license is approved.  Follow-Up Instructions: No follow-ups on file.   Orders:  Orders Placed This Encounter  Procedures  . XR Lumbar Spine 2-3 Views   No orders of the defined types were placed in this encounter.     Procedures: No procedures performed   Clinical Data: No additional findings.   Subjective: Chief Complaint  Patient presents with  . Lower Back - Follow-up    73 year old female with history of CEA left and right by Dr. Sena Hitchheng about 4 years ago she is now about 1.5 years post lumbar decompressive laminectomy and is experiencing symptoms into the left leg with burning and tingling along the medial left ankle and she does see improvement in the standing and walking pain with standing in one place. No Bowel or bladder changes. She has incontinence of bladder and is able to sense when her bladder is full and has urgency. [facility] and takes the meds for incontinence but has side effects of the medication. She is able to grocery shop.    Review of Systems   Constitutional: Negative.   HENT: Negative.   Eyes: Negative.   Respiratory: Negative.   Cardiovascular: Negative.   Gastrointestinal: Negative.   Endocrine: Negative.   Genitourinary: Negative.   Musculoskeletal: Negative.   Skin: Negative.   Allergic/Immunologic: Negative.   Neurological: Negative.   Hematological: Negative.   Psychiatric/Behavioral: Negative.      Objective: Vital Signs: BP (!) 155/61 (BP Location: Left Arm, Patient Position: Sitting)   Pulse (!) 56   Ht 5\' 5"  (1.651 m)   Wt 155 lb (70.3 kg)   LMP 01/08/2012   BMI 25.79 kg/m   Physical Exam Constitutional:      Appearance: She is well-developed.  HENT:     Head: Normocephalic and atraumatic.  Eyes:     Pupils: Pupils are equal, round, and reactive to light.  Neck:     Musculoskeletal: Normal range of motion and neck supple.  Pulmonary:     Effort: Pulmonary effort is normal.     Breath sounds: Normal breath sounds.  Abdominal:     General: Bowel sounds are normal.     Palpations: Abdomen is soft.  Musculoskeletal: Normal range of motion.  Skin:    General: Skin is warm and dry.  Neurological:     Mental Status: She is alert and oriented to person, place, and time.  Psychiatric:  Behavior: Behavior normal.        Thought Content: Thought content normal.        Judgment: Judgment normal.     Back Exam   Tenderness  The patient is experiencing tenderness in the lumbar.  Muscle Strength  Right Quadriceps:  5/5  Left Quadriceps:  5/5  Right Hamstrings:  5/5  Left Hamstrings:  5/5   Reflexes  Patellar: 2/4 Achilles: 2/4 Babinski's sign: normal   Other  Toe walk: normal Heel walk: normal Sensation: normal Gait: normal  Erythema: no back redness Scars: absent  Comments:  Left EHL 5-/5 right 5/5, remaining lower extremity motor is normal.      Specialty Comments:  No specialty comments available.  Imaging: Xr Lumbar Spine 2-3 Views  Result Date: 10/07/2018 AP  and lateral flexion and extension radiographs show grade 1 anterolisthesisl L4-5, lumbar scoliosis with apex to the left at L3-4 about 15 degrees. The degree of anterolisthesis at L4-5 is greater than seen in 12/2016. Lumbar DDD L1-2 through L5-S1.     PMFS History: Patient Active Problem List   Diagnosis Date Noted  . OAB (overactive bladder) 11/18/2017  . Hypertension 12/17/2016  . Lumbar stenosis with neurogenic claudication 07/03/2016  . Carotid disease, bilateral (HCC) 03/03/2015  . Chronic back pain 03/03/2014  . Seasonal allergies 03/03/2014  . Genital herpes 03/03/2014  . Hyperlipidemia 03/03/2013  . Anxiety and depression 12/11/2011   Past Medical History:  Diagnosis Date  . Anxiety   . Arthritis    "knees, right hip, lumbar back" (12/17/2016)  . Chicken pox   . Chronic lower back pain   . DDD (degenerative disc disease), lumbar   . Depression   . Headache 06/23/2015   "2d after carotid OR & again today" (12/17/2016)  . Heart murmur    MVP  . High cholesterol   . History of blood transfusion 1975   "related to femur  fracture" (12/17/2016)  . HOH (hard of hearing)    left  . Hypertension   . MVP (mitral valve prolapse)    echo 1986-mild  . 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 Shortness of breath dyspnea    WITH EXERTION    . Spinal stenosis of lumbar region   . Stroke [facility]) 04/2015   "light stroke; sometimes my thinking is slow since" (12/17/2016)  . Wears glasses     Family History  Adopted: Yes  Problem Relation Age of Onset  . Osteoporosis Mother   . Heart disease Mother   . Hypertension Mother   . Hyperlipidemia Mother   . Stroke Mother   . Arthritis Mother   . Diabetes Father   . Heart attack Father   . Cancer Sister        breast  . Colon cancer Neg Hx     Past Surgical History:  Procedure Laterality Date  . CARPAL TUNNEL RELEASE Right 03/31/2013    Procedure: RIGHT CARPAL TUNNEL RELEASE;  Surgeon: Nicki Reaper, MD;  Location: [facility];  Service: Orthopedics;  Laterality: Right;  . ENDARTERECTOMY Right 05/03/2015   Procedure: RIGHT CAROTID ENDARTERECTOMY ;  Surgeon: Fransisco Hertz, MD;  Location[facility];  Service: Vascular;  Laterality: Right;  . FEMUR FRACTURE SURGERY Right 1975   "hip"  . FRACTURE SURGERY    . LUMBAR LAMINECTOMY N/A 05/26/2017   Procedure: BILATERAL PARTIAL HEMILAMINECTOMIES L3-4 AND L4-5;  Surgeon: [name]  E, MD;  Location: [facility];  Service: Orthopedics;  Laterality: N/A;  . NASAL SINUS SURGERY  1975  . PLANTAR FASCIA RELEASE Bilateral 1968  . SHOULDER OPEN ROTATOR CUFF REPAIR Right 1998  . TONSILLECTOMY  1951  . TUBAL LIGATION  1995   Social History   Occupational History  . Occupation: Retired  Tobacco Use  . Smoking status: Never Smoker  . Smokeless tobacco: Never Used  Substance and Sexual Activity  . Alcohol use: Not Currently    Alcohol/week: 3.0 standard drinks    Types: 3 Cans of beer per week  . Drug use: No  . Sexual activity: Yes

## 2018-10-13 ENCOUNTER — Other Ambulatory Visit: Payer: Self-pay

## 2018-10-13 ENCOUNTER — Ambulatory Visit (HOSPITAL_COMMUNITY)
Admission: RE | Admit: 2018-10-13 | Discharge: 2018-10-13 | Disposition: A | Discharge status: Home / Self Care | Payer: Medicare Other | Source: Ambulatory Visit | Attending: Cardiovascular Disease | Admitting: Cardiovascular Disease

## 2018-10-13 DIAGNOSIS — I739 Peripheral vascular disease, unspecified: Secondary | ICD-10-CM | POA: Diagnosis not present

## 2018-10-13 DIAGNOSIS — Z8679 Personal history of other diseases of the circulatory system: Secondary | ICD-10-CM | POA: Diagnosis present

## 2018-10-15 ENCOUNTER — Other Ambulatory Visit: Payer: Self-pay

## 2018-10-15 ENCOUNTER — Ambulatory Visit
Admission: RE | Admit: 2018-10-15 | Discharge: 2018-10-15 | Disposition: A | Discharge status: Home / Self Care | Payer: Medicare Other | Source: Ambulatory Visit | Attending: Specialist | Admitting: Specialist

## 2018-10-15 DIAGNOSIS — M4807 Spinal stenosis, lumbosacral region: Secondary | ICD-10-CM

## 2018-10-15 MED ORDER — GADOBENATE DIMEGLUMINE 529 MG/ML IV SOLN
14.0000 mL | Freq: Once | INTRAVENOUS | Status: AC | PRN
  Administered 2018-10-15: 15:00:00 14 mL via INTRAVENOUS

## 2018-10-21 ENCOUNTER — Other Ambulatory Visit: Payer: Self-pay | Admitting: Internal Medicine

## 2018-10-29 ENCOUNTER — Other Ambulatory Visit: Payer: Self-pay | Admitting: Internal Medicine

## 2018-11-04 ENCOUNTER — Encounter: Payer: Self-pay | Admitting: Specialist

## 2018-11-04 ENCOUNTER — Ambulatory Visit (INDEPENDENT_AMBULATORY_CARE_PROVIDER_SITE_OTHER): Payer: Medicare Other | Admitting: Specialist

## 2018-11-04 VITALS — BP 143/68 | HR 54 | Ht 65.0 in | Wt 155.0 lb

## 2018-11-04 DIAGNOSIS — M48062 Spinal stenosis, lumbar region with neurogenic claudication: Secondary | ICD-10-CM | POA: Diagnosis not present

## 2018-11-04 DIAGNOSIS — M4316 Spondylolisthesis, lumbar region: Secondary | ICD-10-CM

## 2018-11-04 DIAGNOSIS — M5136 Other intervertebral disc degeneration, lumbar region: Secondary | ICD-10-CM | POA: Diagnosis not present

## 2018-11-04 MED ORDER — DICLOFENAC SODIUM 50 MG PO TBEC: 50.0000 mg | DELAYED_RELEASE_TABLET | Freq: Two times a day (BID) | ORAL | 3 refills | Status: DC

## 2018-11-04 NOTE — Patient Instructions (Signed)
Avoid bending, stooping and avoid lifting weights greater than 10 lbs. Avoid prolong standing and walking. Avoid frequent bending and stooping  No lifting greater than 10 lbs. May use ice or moist heat for pain. Weight loss is of benefit. Handicap license is approved. Discontinue mobic or meloxicam and start diclofenac 50 mg po daily.

## 2018-11-04 NOTE — Progress Notes (Signed)
Office Visit Note   Patient: Heather [name]           Date of Birth: 06-07-1945           MRN: [id number] Visit Date: 11/04/2018              Requested by: [name], Regina W, NP [address] [name],  [place] [id number] PCP: [name], Regina W, NP   Assessment & Plan: Visit Diagnoses:  1. Spinal stenosis of lumbar region with neurogenic claudication   2. Spondylolisthesis, lumbar region   3. Degenerative disc disease, lumbar     Plan:Avoid bending, stooping and avoid lifting weights greater than 10 lbs. Avoid prolong standing and walking. Avoid frequent bending and stooping  No lifting greater than 10 lbs. May use ice or moist heat for pain. Weight loss is of benefit. Handicap license is approved. Discontinue mobic or meloxicam and start diclofenac 50 mg po daily.  Follow-Up Instructions: No follow-ups on file.   Orders:  No orders of the defined types were placed in this encounter.  No orders of the defined types were placed in this encounter.     Procedures: No procedures performed   Clinical Data: No additional findings.   Subjective: Chief Complaint  Patient presents with  . 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 She has had lumbar laminectomy surgery. She is still having pain with standing and walking. She follows and her husband pushes the cart. She does not lift. Last time her pain was severe but today not as bad. Taking tramadol for pain, and gabapentin BID and aspirin. She takes mobic once daily. She reports her back pain is better but she has some days.   Review of Systems  Constitutional: Positive for activity change. Negative for appetite change, chills, diaphoresis, fatigue, fever and unexpected weight change.  HENT: Negative.  Negative for congestion, dental problem, drooling, ear discharge, ear pain, facial swelling, hearing loss, mouth sores, nosebleeds, postnasal drip,  rhinorrhea, sinus pressure, sinus pain, sneezing, sore throat, tinnitus, trouble swallowing and voice change.   Eyes: Negative for photophobia, pain, discharge, redness, itching and visual disturbance.  Respiratory: Positive for cough. Negative for apnea, choking, chest tightness, shortness of breath, wheezing and stridor.   Cardiovascular: Negative for chest pain, palpitations and leg swelling.  Gastrointestinal: Negative for abdominal distention, abdominal pain, anal bleeding, blood in stool, constipation, diarrhea, nausea, rectal pain and vomiting.  Endocrine: Negative for cold intolerance, heat intolerance, polydipsia, polyphagia and polyuria.  Genitourinary: Negative for difficulty urinating, dyspareunia, dysuria, enuresis, flank pain, frequency, genital sores, hematuria and urgency.  Musculoskeletal: Positive for back pain and gait problem. Negative for arthralgias, joint swelling, myalgias, neck pain and neck stiffness.  Skin: Negative.  Negative for color change, pallor, rash and wound.  Allergic/Immunologic: Negative for environmental allergies and food allergies.  Neurological: Positive for weakness and numbness. Negative for dizziness, tremors, seizures, syncope, facial asymmetry, speech difficulty, light-headedness and headaches.  Hematological: Negative.  Negative for adenopathy. Does not bruise/bleed easily.  Psychiatric/Behavioral: Negative.  Negative for agitation, behavioral problems, confusion, decreased concentration, dysphoric mood, hallucinations, self-injury, sleep disturbance and suicidal ideas. The patient is not nervous/anxious and is not hyperactive.      Objective: Vital Signs: BP (!) 143/68 (BP Location: Left Arm, Patient Position: Sitting)   Pulse (!) 54   Ht 5\' 5"  (1.651 m)   Wt 155 lb (70.3 kg)  LMP 01/08/2012   BMI 25.79 kg/m   Physical Exam Constitutional:      Appearance: She is well-developed.  HENT:     Head: Normocephalic and atraumatic.  Eyes:      Pupils: Pupils are equal, round, and reactive to light.  Neck:     Musculoskeletal: Normal range of motion and neck supple.  Pulmonary:     Effort: Pulmonary effort is normal.     Breath sounds: Normal breath sounds.  Abdominal:     General: Bowel sounds are normal.     Palpations: Abdomen is soft.  Skin:    General: Skin is warm and dry.  Neurological:     Mental Status: She is alert and oriented to person, place, and time.  Psychiatric:        Behavior: Behavior normal.        Thought Content: Thought content normal.        Judgment: Judgment normal.     Back Exam   Tenderness  The patient is experiencing tenderness in the lumbar.  Range of Motion  Extension: abnormal  Flexion: abnormal  Lateral bend right: abnormal  Lateral bend left: abnormal  Rotation right: abnormal  Rotation left: abnormal   Muscle Strength  Right Quadriceps:  5/5  Left Quadriceps:  5/5  Right Hamstrings:  5/5  Left Hamstrings:  5/5   Tests  Straight leg raise right: negative Straight leg raise left: negative  Reflexes  Patellar: normal Achilles: normal Biceps: normal Babinski's sign: normal   Other  Toe walk: normal Heel walk: normal Sensation: normal Gait: normal  Erythema: no back redness Scars: present      Specialty Comments:  No specialty comments available.  Imaging: No results found.   PMFS History: Patient Active Problem List   Diagnosis Date Noted  . OAB (overactive bladder) 11/18/2017  . Hypertension 12/17/2016  . Lumbar stenosis with neurogenic claudication 07/03/2016  . Carotid disease, bilateral (HCC) 03/03/2015  . Chronic back pain 03/03/2014  . Seasonal allergies 03/03/2014  . Genital herpes 03/03/2014  . Hyperlipidemia 03/03/2013  . Anxiety and depression 12/11/2011   Past Medical History:  Diagnosis Date  . Anxiety   . Arthritis    "knees, right hip, lumbar back" (12/17/2016)  . Chicken pox   . Chronic lower back pain   . DDD  (degenerative disc disease), lumbar   . Depression   . Headache 06/23/2015   "2d after carotid OR & again today" (12/17/2016)  . Heart murmur    MVP  . High cholesterol   . History of blood transfusion 1975   "related to femur  fracture" (12/17/2016)  . HOH (hard of hearing)    left  . Hypertension   . MVP (mitral valve prolapse)    echo 1986-mild  . 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 Shortness of breath dyspnea    WITH EXERTION    . Spinal stenosis of lumbar region   . Stroke [facility]) 04/2015   "light stroke; sometimes my thinking is slow since" (12/17/2016)  . Wears glasses     Family History  Adopted: Yes  Problem Relation Age of Onset  . Osteoporosis Mother   . Heart disease Mother   . Hypertension Mother   . Hyperlipidemia Mother   . Stroke Mother   . Arthritis Mother   . Diabetes Father   . Heart attack Father   . Cancer Sister  breast  . Colon cancer Neg Hx     Past Surgical History:  Procedure Laterality Date  . CARPAL TUNNEL RELEASE Right 03/31/2013   Procedure: RIGHT CARPAL TUNNEL RELEASE;  Surgeon: Wynonia Sours, MD;  Location: [facility];  Service: Orthopedics;  Laterality: Right;  . ENDARTERECTOMY Right 05/03/2015   Procedure: RIGHT CAROTID ENDARTERECTOMY ;  Surgeon: Conrad Panaca, MD;  Location: [facility];  Service: Vascular;  Laterality: Right;  . FEMUR FRACTURE SURGERY Right 1975   "hip"  . FRACTURE SURGERY    . LUMBAR LAMINECTOMY N/A 05/26/2017   Procedure: BILATERAL PARTIAL HEMILAMINECTOMIES L3-4 AND L4-5;  Surgeon: Jessy Oto, MD;  Location: [facility];  Service: Orthopedics;  Laterality: N/A;  . NASAL SINUS SURGERY  1975  . PLANTAR FASCIA RELEASE Bilateral 1968  . SHOULDER OPEN ROTATOR CUFF REPAIR Right 1998  . TONSILLECTOMY  1951  . TUBAL LIGATION  1995   Social History   Occupational History  . Occupation: Retired  Tobacco Use  . Smoking  status: Never Smoker  . Smokeless tobacco: Never Used  Substance and Sexual Activity  . Alcohol use: Not Currently    Alcohol/week: 3.0 standard drinks    Types: 3 Cans of beer per week  . Drug use: No  . Sexual activity: Yes

## 2018-11-12 ENCOUNTER — Other Ambulatory Visit (INDEPENDENT_AMBULATORY_CARE_PROVIDER_SITE_OTHER): Payer: Self-pay | Admitting: Specialist

## 2018-11-20 IMAGING — CR DG CHEST 2V
2 series · 2 of 2 positions shown · non-contrast
Comparison: None.

CLINICAL DATA: 70-year-old female with chest pain and shortness of
breath.

EXAM:
CHEST  2 VIEW

[chest lat]
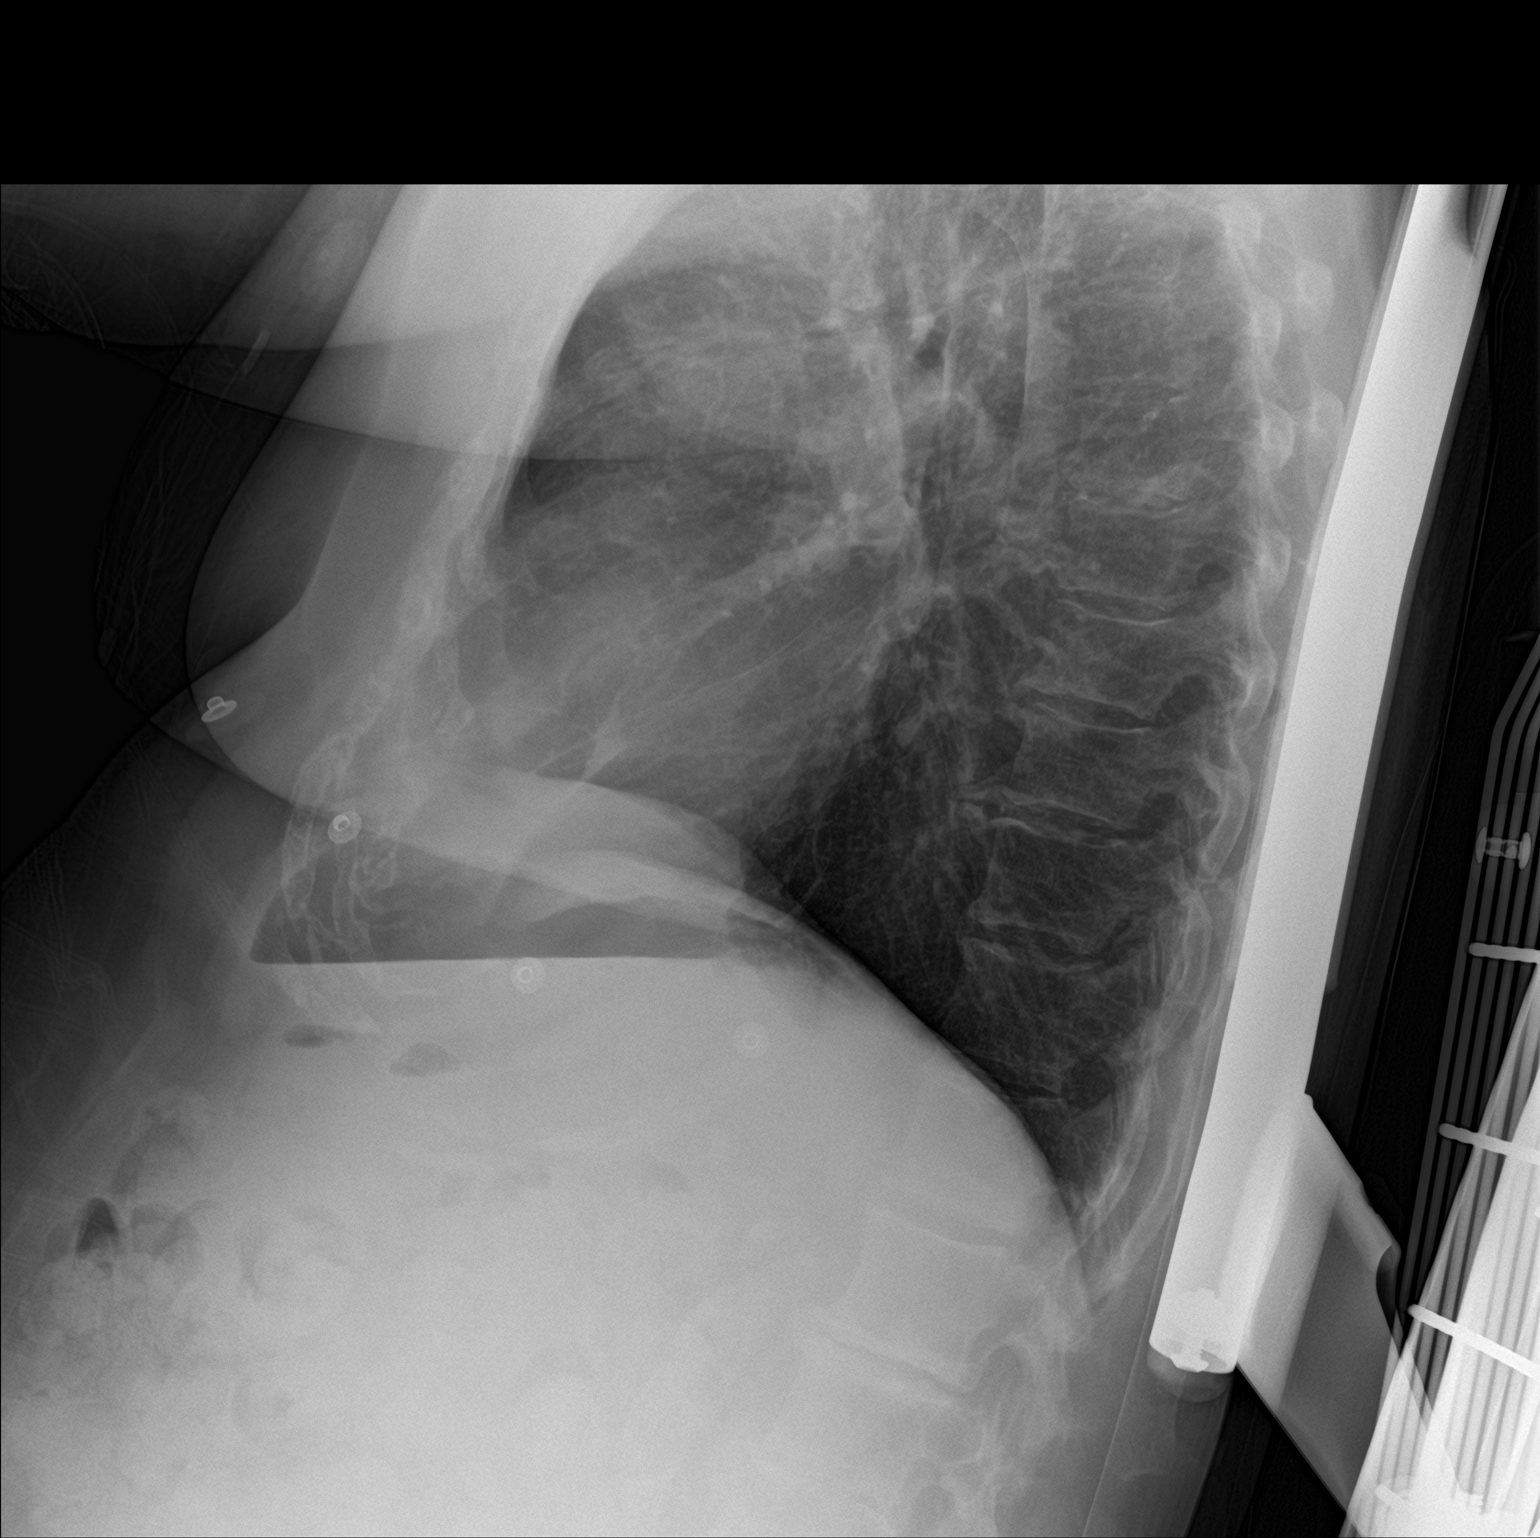

[chest ap]
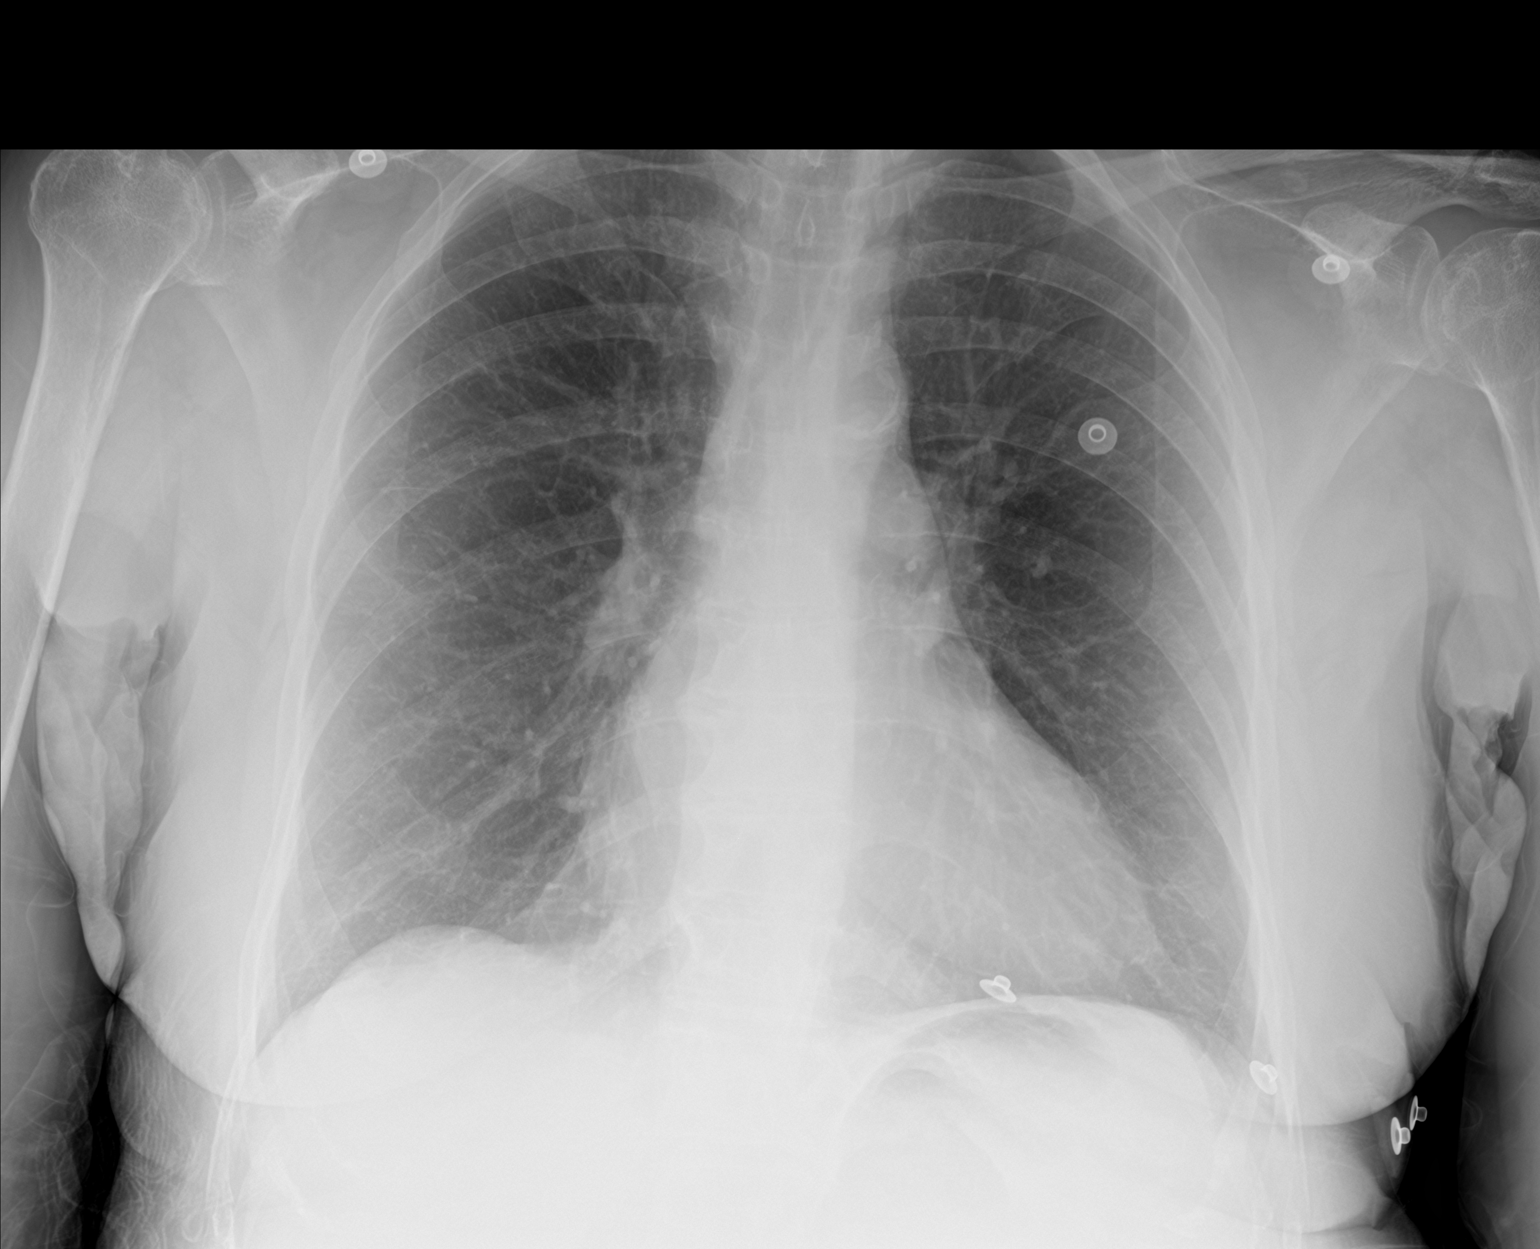

[2 of 2 positions shown; findings below may reference images not displayed]

FINDINGS: The lungs are clear. There is no pleural effusion or pneumothorax.
The cardiac silhouette is within normal limits. There is
atherosclerotic calcification of the aortic arch. Osteopenia with
degenerative changes of the spine and shoulders. There are
atherosclerotic calcification abdominal aorta. No acute osseous
pathology.
IMPRESSION: No active cardiopulmonary disease.

## 2018-12-01 ENCOUNTER — Other Ambulatory Visit: Payer: Self-pay | Admitting: Specialist

## 2019-01-04 ENCOUNTER — Other Ambulatory Visit: Payer: Self-pay | Admitting: Obstetrics & Gynecology

## 2019-01-04 ENCOUNTER — Other Ambulatory Visit: Payer: Self-pay | Admitting: Internal Medicine

## 2019-01-07 ENCOUNTER — Other Ambulatory Visit: Payer: Self-pay | Admitting: Internal Medicine

## 2019-01-30 ENCOUNTER — Other Ambulatory Visit: Payer: Self-pay | Admitting: Internal Medicine

## 2019-02-04 ENCOUNTER — Encounter: Payer: Self-pay | Admitting: Specialist

## 2019-02-04 ENCOUNTER — Ambulatory Visit (INDEPENDENT_AMBULATORY_CARE_PROVIDER_SITE_OTHER): Payer: Medicare Other | Admitting: Specialist

## 2019-02-04 ENCOUNTER — Other Ambulatory Visit: Payer: Self-pay

## 2019-02-04 VITALS — BP 151/57 | HR 56 | Ht 65.0 in | Wt 155.0 lb

## 2019-02-04 DIAGNOSIS — M1711 Unilateral primary osteoarthritis, right knee: Secondary | ICD-10-CM | POA: Diagnosis not present

## 2019-02-04 DIAGNOSIS — M48062 Spinal stenosis, lumbar region with neurogenic claudication: Secondary | ICD-10-CM

## 2019-02-04 DIAGNOSIS — M1712 Unilateral primary osteoarthritis, left knee: Secondary | ICD-10-CM

## 2019-02-04 MED ORDER — BUPIVACAINE HCL 0.25 % IJ SOLN
4.0000 mL | INTRAMUSCULAR | Status: AC | PRN
  Administered 2019-02-04: 4 mL via INTRA_ARTICULAR

## 2019-02-04 MED ORDER — METHYLPREDNISOLONE ACETATE 40 MG/ML IJ SUSP
40.0000 mg | INTRAMUSCULAR | Status: AC | PRN
  Administered 2019-02-04: 12:00:00 40 mg via INTRA_ARTICULAR

## 2019-02-04 NOTE — Patient Instructions (Signed)
Plan: Knee is suffering from osteoarthritis, only real proven treatments are Weight loss, NSIADs like diclofenac and exercise. Well padded shoes help. Ice the knee 2 times per day. The knee is suffering from osteoarthritis, only real proven treatments are Weight loss, NSIADs like diclofenac and exercise. Well padded shoes help. Ice the knee 2-3 times a day 15-20 mins at a time.-3 times a day 15-20 mins at a time. Hot showers in the AM.  Injection with steroid may be of benefit. Hemp CBD capsules, amazon.[employer] 5,000-7,000 mg per bottle, 60 capsules per bottle, take one capsule twice a day. Cane in the left hand to use with left leg weight bearing. Follow-Up Instructions: No follow-ups on file. Avoid bending, stooping and avoid lifting weights greater than 10 lbs. Avoid prolong standing and walking. Avoid frequent bending and stooping  No lifting greater than 10 lbs. May use ice or moist heat for pain. Weight loss is of benefit. Handicap license is approved. If you desire we can arrange for Dr. [place] Blas secretary/Assistant will call to arrange for epidural steroid injection or evaluation for consideration of a spinal cord stimulator.

## 2019-02-04 NOTE — Progress Notes (Signed)
Office Visit Note   Patient: Heather [name]           Date of Birth: [birth year]           MRN: [id number] Visit Date: 02/04/2019              Requested by: Jearld Fenton, NP [place],  [place] [id number] PCP: Jearld Fenton, NP   Assessment & Plan: Visit Diagnoses:  1. Unilateral primary osteoarthritis, left knee   2. Unilateral primary osteoarthritis, right knee   3. Spinal stenosis of lumbar region with neurogenic claudication     Plan: Plan: Knee is suffering from osteoarthritis, only real proven treatments are Weight loss, NSIADs like diclofenac and exercise. Well padded shoes help. Ice the knee 2 times per day. The knee is suffering from osteoarthritis, only real proven treatments are Weight loss, NSIADs like diclofenac and exercise. Well padded shoes help. Ice the knee 2-3 times a day 15-20 mins at a time.-3 times a day 15-20 mins at a time. Hot showers in the AM.  Injection with steroid may be of benefit. Hemp CBD capsules, amazon.com 5,000-7,000 mg per bottle, 60 capsules per bottle, take one capsule twice a day. Cane in the left hand to use with left leg weight bearing. Follow-Up Instructions: No follow-ups on file. Avoid bending, stooping and avoid lifting weights greater than 10 lbs. Avoid prolong standing and walking. Avoid frequent bending and stooping  No lifting greater than 10 lbs. May use ice or moist heat for pain. Weight loss is of benefit. Handicap license is approved. If you desire we can arrange for Dr. Romona Curls secretary/Assistant will call to arrange for epidural steroid injection or evaluation for consideration of a spinal cord stimulator.   Follow-Up Instructions: No follow-ups on file.   Orders:  No orders of the defined types were placed in this encounter.  No orders of the defined types were placed in this encounter.     Procedures: Large Joint Inj: bilateral knee on 02/04/2019 12:00 PM Indications: pain Details: 25  G 1.5 in needle, anterolateral approach  Arthrogram: No  Medications (Right): 4 mL bupivacaine 0.25 %; 40 mg methylPREDNISolone acetate 40 MG/ML Aspirate (Right): clear Medications (Left): 4 mL bupivacaine 0.25 %; 40 mg methylPREDNISolone acetate 40 MG/ML Aspirate (Left): clear Outcome: tolerated well, no immediate complications Procedure, treatment alternatives, risks and benefits explained, specific risks discussed. Consent was given by the patient. Immediately prior to procedure a time out was called to verify the correct patient, procedure, equipment, support staff and site/side marked as required. Patient was prepped and draped in the usual sterile fashion.       Clinical Data: No additional findings.   Subjective: Chief Complaint  Patient presents with  . Lower Back - Follow-up    74 year old female with history of lumbar spinal stenosis post lumbar decompression with some improvement in overall  Pain but still needs to use tramadol to relieve pain daily. She has bilateral knee pain and discomfort and feelings of the knee giving awake. She can not kneel or squat due to knee pain and has trouble arising from sitting and starting or initiating walking. She is    Review of Systems   Objective: Vital Signs: BP (!) 151/57 (BP Location: Left Arm, Patient Position: Sitting)   Pulse (!) 56   Ht 5\' 5"  (1.651 m)   Wt 155 lb (70.3 kg)   LMP 01/08/2012   BMI 25.79 kg/m  Physical Exam Musculoskeletal:     Right knee:     Instability Tests: Negative anterior drawer test. Negative posterior drawer test. Positive medial McMurray test.     Left knee:     Instability Tests: Negative anterior drawer test. Negative posterior drawer test. Positive medial McMurray test.     Right Knee Exam   Muscle Strength  The patient has normal right knee strength.  Tenderness  The patient is experiencing tenderness in the medial retinaculum, medial joint line and patella.  Range of  Motion  Extension: normal  Flexion: normal   Tests  McMurray:  Medial - positive  Lachman:  Anterior - negative    Posterior - negative Drawer:  Anterior - negative    Posterior - negative Pivot shift: negative Patellar apprehension: positive  Other  Erythema: absent Scars: absent Pulse: present Swelling: mild   Left Knee Exam   Muscle Strength  The patient has normal left knee strength.  Tenderness  The patient is experiencing tenderness in the medial retinaculum, medial joint line and patella.  Range of Motion  Extension: normal  Flexion: normal   Tests  McMurray:  Medial - positive  Lachman:  Anterior - negative    Posterior - negative Drawer:  Anterior - negative     Posterior - negative Pivot shift: negative Patellar apprehension: positive  Other  Erythema: absent Scars: absent Sensation: normal Swelling: mild      Specialty Comments:  No specialty comments available.  Imaging: No results found.   PMFS History: Patient Active Problem List   Diagnosis Date Noted  . OAB (overactive bladder) 11/18/2017  . Hypertension 12/17/2016  . Lumbar stenosis with neurogenic claudication 07/03/2016  . Carotid disease, bilateral ([facility]) 03/03/2015  . Chronic back pain 03/03/2014  . Seasonal allergies 03/03/2014  . Genital herpes 03/03/2014  . Hyperlipidemia 03/03/2013  . Anxiety and depression 12/11/2011   Past Medical History:  Diagnosis Date  . Anxiety   . Arthritis    "knees, right hip, lumbar back" (12/17/2016)  . Chicken pox   . Chronic lower back pain   . DDD (degenerative disc disease), lumbar   . Depression   . Headache 06/23/2015   "2d after carotid OR & again today" (12/17/2016)  . Heart murmur    MVP  . High cholesterol   . History of blood transfusion 1975   "related to femur  fracture" (12/17/2016)  . HOH (hard of hearing)    left  . Hypertension   . MVP (mitral valve prolapse)    echo 1986-mild  . 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   Orogenital transmission (husband had cold sore; HSV1 culture positive)  . Shortness of breath dyspnea    WITH EXERTION    . Spinal stenosis of lumbar region   . Stroke [facility]) 04/2015   "light stroke; sometimes my thinking is slow since" (12/17/2016)  . Wears glasses     Family History  Adopted: Yes  Problem Relation Age of Onset  . Osteoporosis Mother   . Heart disease Mother   . Hypertension Mother   . Hyperlipidemia Mother   . Stroke Mother   . Arthritis Mother   . Diabetes Father   . Heart attack Father   . Cancer Sister        breast  . Colon cancer Neg Hx     Past Surgical History:  Procedure Laterality Date  . CARPAL TUNNEL RELEASE Right 03/31/2013   Procedure: RIGHT CARPAL TUNNEL RELEASE;  Surgeon:  Nicki Reaper, MD;  Location: [facility];  Service: Orthopedics;  Laterality: Right;  . ENDARTERECTOMY Right 05/03/2015   Procedure: RIGHT CAROTID ENDARTERECTOMY ;  Surgeon: Fransisco Hertz, MD;  Location: [facility] OR;  Service: Vascular;  Laterality: Right;  . FEMUR FRACTURE SURGERY Right 1975   "hip"  . FRACTURE SURGERY    . LUMBAR LAMINECTOMY N/A 05/26/2017   Procedure: BILATERAL PARTIAL HEMILAMINECTOMIES L3-4 AND L4-5;  Surgeon: Kerrin Champagne, MD;  Location: [facility] OR;  Service: Orthopedics;  Laterality: N/A;  . NASAL SINUS SURGERY  1975  . PLANTAR FASCIA RELEASE Bilateral 1968  . SHOULDER OPEN ROTATOR CUFF REPAIR Right 1998  . TONSILLECTOMY  1951  . TUBAL LIGATION  1995   Social History   Occupational History  . Occupation: Retired  Tobacco Use  . Smoking status: Never Smoker  . Smokeless tobacco: Never Used  Substance and Sexual Activity  . Alcohol use: Not Currently    Alcohol/week: 3.0 standard drinks    Types: 3 Cans of beer per week  . Drug use: No  . Sexual activity: Yes

## 2019-02-15 ENCOUNTER — Other Ambulatory Visit: Payer: Self-pay | Admitting: Specialist

## 2019-02-26 ENCOUNTER — Other Ambulatory Visit: Payer: Self-pay | Admitting: Internal Medicine

## 2019-03-27 ENCOUNTER — Other Ambulatory Visit: Payer: Self-pay | Admitting: Internal Medicine

## 2019-03-29 NOTE — Telephone Encounter (Signed)
Overdue CPE letter mailed 

## 2019-04-05 ENCOUNTER — Other Ambulatory Visit: Payer: Self-pay | Admitting: Specialist

## 2019-04-30 IMAGING — CR DG LUMBAR SPINE 1V
1 series · 1 of 1 positions shown · non-contrast
Comparison: Lumbar spine series of January 02, 2017

CLINICAL DATA: Lateral localization radiograph in the operating
room at [DATE] a.m. on May 26, 2017

EXAM:
LUMBAR SPINE - 1 VIEW

[lateral]
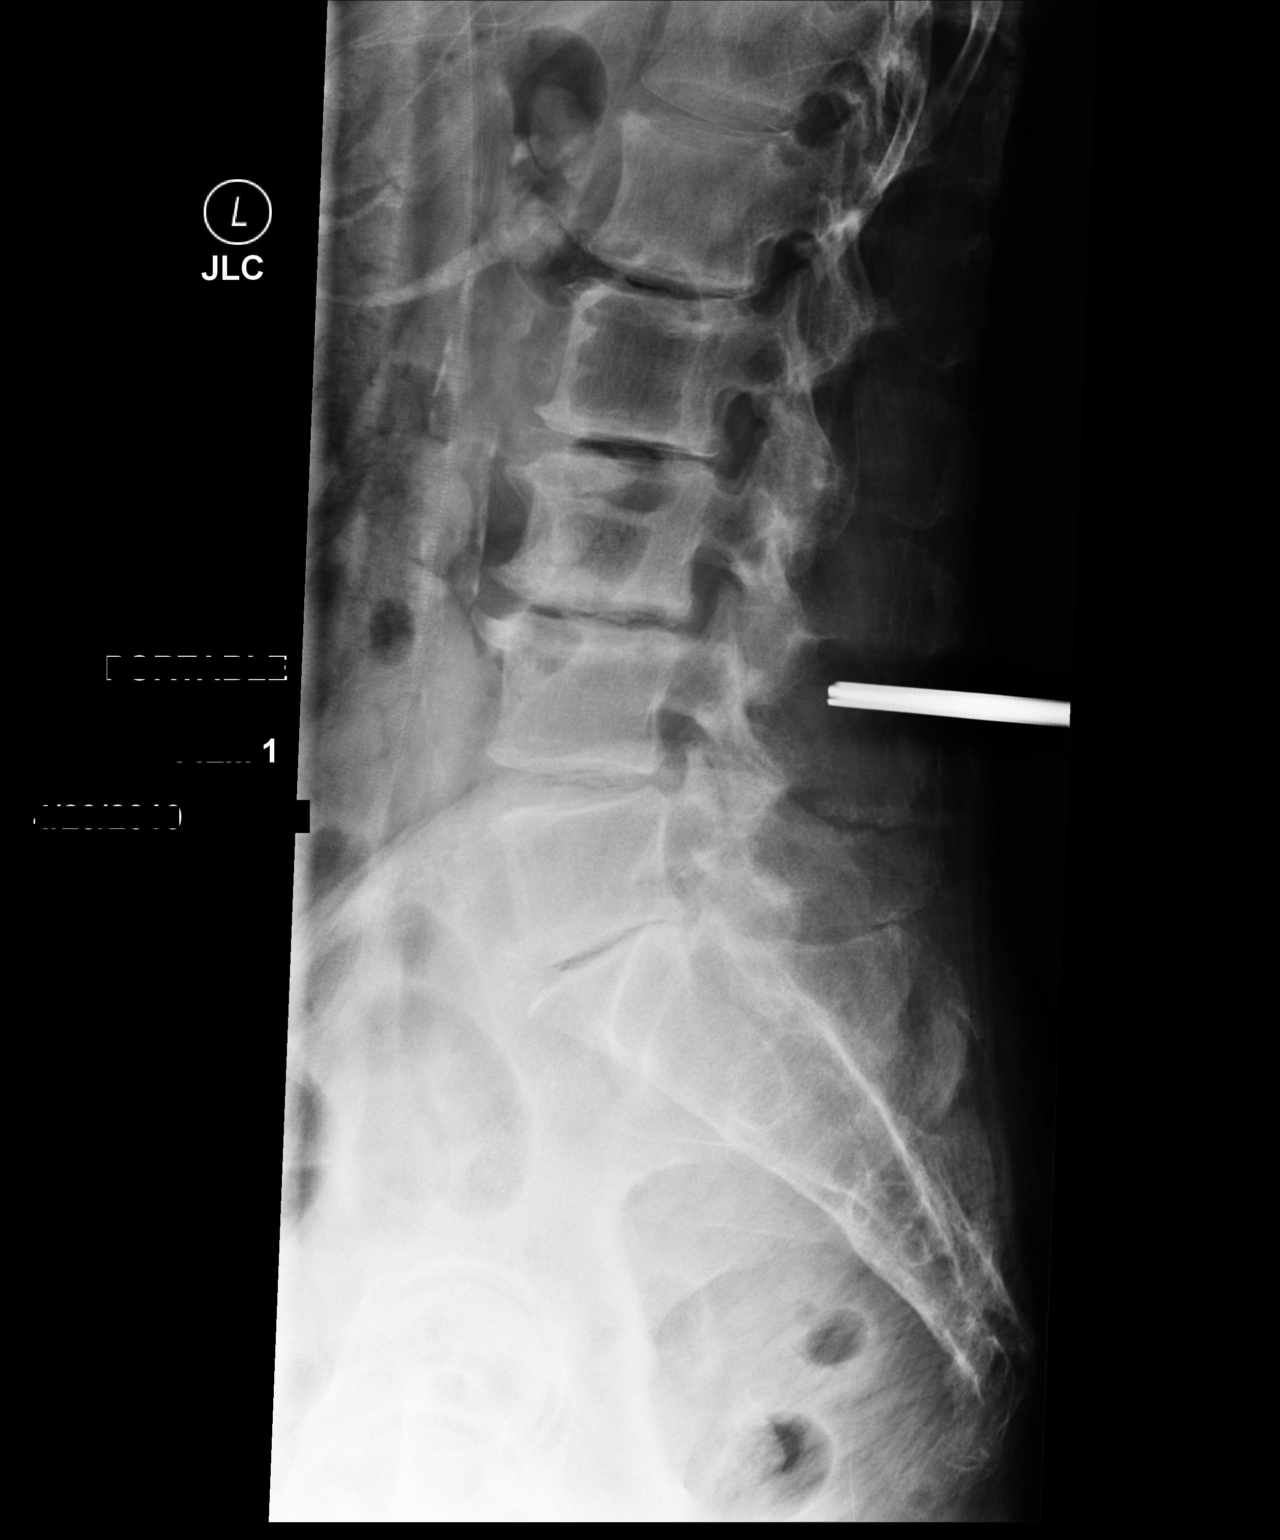

[1 of 1 positions shown; findings below may reference images not displayed]

FINDINGS: The metallic trocar projects over the superior aspect of the L4
spinous process. There is multilevel degenerative disc disease of
the lumbar spine. There is no compression fracture.
IMPRESSION: The metallic trocar projects over the superior aspect of the L4
spinous process approximately 4.3 cm posterior to the L4 vertebral
body.

## 2019-05-05 ENCOUNTER — Ambulatory Visit: Payer: Medicare Other | Admitting: Specialist

## 2019-05-05 ENCOUNTER — Encounter: Payer: Self-pay | Admitting: Specialist

## 2019-05-05 ENCOUNTER — Telehealth: Payer: Self-pay | Admitting: *Deleted

## 2019-05-05 ENCOUNTER — Other Ambulatory Visit: Payer: Self-pay

## 2019-05-05 VITALS — BP 162/63 | HR 51 | Ht 65.0 in | Wt 155.0 lb

## 2019-05-05 DIAGNOSIS — I739 Peripheral vascular disease, unspecified: Secondary | ICD-10-CM | POA: Diagnosis not present

## 2019-05-05 DIAGNOSIS — M4316 Spondylolisthesis, lumbar region: Secondary | ICD-10-CM | POA: Diagnosis not present

## 2019-05-05 DIAGNOSIS — M545 Low back pain: Secondary | ICD-10-CM | POA: Diagnosis not present

## 2019-05-05 NOTE — Progress Notes (Signed)
Office Visit Note   Patient: Heather [name]           Date of Birth: 11/20/1945           MRN: [id number] Visit Date: 05/05/2019              Requested by: Jearld Fenton, NP [place],  [place] [address] PCP: Jearld Fenton, NP   Assessment & Plan: Visit Diagnoses:  1. Spondylolisthesis, lumbar region   2. Claudication of left lower extremity ([place])     Plan: Avoid bending, stooping and avoid lifting weights greater than 10 lbs. Avoid prolong standing and walking. Avoid frequent bending and stooping  No lifting greater than 10 lbs. May use ice or moist heat for pain. Weight loss is of benefit. Handicap license is approved. Dr. Romona Curls secretary/Assistant will call to arrange for facet blocks at L4-5 with steroid and marcaine injection   Follow-Up Instructions: Return in about 6 weeks (around 06/16/2019).   Orders:  No orders of the defined types were placed in this encounter.  No orders of the defined types were placed in this encounter.     Procedures: No procedures performed   Clinical Data: No additional findings.   Subjective: Chief Complaint  Patient presents with  . Lower Back - Follow-up    74 year old female with history of lumbar spinal stenosis and bilateral knee OA. She underwent a lumbar decompression for stenosis but has persistent pain in the  Small of her back that is transverse. She reports she is slowing down and not as active as she normally would be. Has some trouble with bladder urgency and can not wait long to relieve herself. There is no leg numbness but persisting nocturnal cramping in both legs. Moving the toes downwards causes them to cramp up.    Review of Systems  Constitutional: Negative.   HENT: Negative.   Eyes: Negative.   Respiratory: Negative.   Cardiovascular: Negative.   Gastrointestinal: Negative.   Endocrine: Negative.   Genitourinary: Negative.   Musculoskeletal: Negative.   Skin: Negative.     Allergic/Immunologic: Negative.   Neurological: Negative.   Hematological: Negative.   Psychiatric/Behavioral: Negative.      Objective: Vital Signs: BP (!) 162/63 (BP Location: Left Arm, Patient Position: Sitting)   Pulse (!) 51   Ht 5\' 5"  (1.651 m)   Wt 155 lb (70.3 kg)   LMP 01/08/2012   BMI 25.79 kg/m   Physical Exam  Ortho Exam  Specialty Comments:  No specialty comments available.  Imaging: No results found.   PMFS History: Patient Active Problem List   Diagnosis Date Noted  . OAB (overactive bladder) 11/18/2017  . Hypertension 12/17/2016  . Lumbar stenosis with neurogenic claudication 07/03/2016  . Carotid disease, bilateral ([place]) 03/03/2015  . Chronic back pain 03/03/2014  . Seasonal allergies 03/03/2014  . Genital herpes 03/03/2014  . Hyperlipidemia 03/03/2013  . Anxiety and depression 12/11/2011   Past Medical History:  Diagnosis Date  . Anxiety   . Arthritis    "knees, right hip, lumbar back" (12/17/2016)  . Chicken pox   . Chronic lower back pain   . DDD (degenerative disc disease), lumbar   . Depression   . Headache 06/23/2015   "2d after carotid OR & again today" (12/17/2016)  . Heart murmur    MVP  . High cholesterol   . History of blood transfusion 1975   "related to femur  fracture" (12/17/2016)  .  HOH (hard of hearing)    left  . Hypertension   . MVP (mitral valve prolapse)    echo 1986-mild  . 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 Shortness of breath dyspnea    WITH EXERTION    . Spinal stenosis of lumbar region   . Stroke [facility]) 04/2015   "light stroke; sometimes my thinking is slow since" (12/17/2016)  . Wears glasses     Family History  Adopted: Yes  Problem Relation Age of Onset  . Osteoporosis Mother   . Heart disease Mother   . Hypertension Mother   . Hyperlipidemia Mother   . Stroke Mother   . Arthritis Mother   . Diabetes  Father   . Heart attack Father   . Cancer Sister        breast  . Colon cancer Neg Hx     Past Surgical History:  Procedure Laterality Date  . CARPAL TUNNEL RELEASE Right 03/31/2013   Procedure: RIGHT CARPAL TUNNEL RELEASE;  Surgeon: Nicki Reaper, MD;  Location: [facility];  Service: Orthopedics;  Laterality: Right;  . ENDARTERECTOMY Right 05/03/2015   Procedure: RIGHT CAROTID ENDARTERECTOMY ;  Surgeon: Fransisco Hertz, MD;  Location: [facility] OR;  Service: Vascular;  Laterality: Right;  . FEMUR FRACTURE SURGERY Right 1975   "hip"  . FRACTURE SURGERY    . LUMBAR LAMINECTOMY N/A 05/26/2017   Procedure: BILATERAL PARTIAL HEMILAMINECTOMIES L3-4 AND L4-5;  Surgeon: Kerrin Champagne, MD;  Location: [facility] OR;  Service: Orthopedics;  Laterality: N/A;  . NASAL SINUS SURGERY  1975  . PLANTAR FASCIA RELEASE Bilateral 1968  . SHOULDER OPEN ROTATOR CUFF REPAIR Right 1998  . TONSILLECTOMY  1951  . TUBAL LIGATION  1995   Social History   Occupational History  . Occupation: Retired  Tobacco Use  . Smoking status: Never Smoker  . Smokeless tobacco: Never Used  Substance and Sexual Activity  . Alcohol use: Not Currently    Alcohol/week: 3.0 standard drinks    Types: 3 Cans of beer per week  . Drug use: No  . Sexual activity: Yes

## 2019-05-05 NOTE — Patient Instructions (Signed)
Avoid bending, stooping and avoid lifting weights greater than 10 lbs. Avoid prolong standing and walking. Avoid frequent bending and stooping  No lifting greater than 10 lbs. May use ice or moist heat for pain. Weight loss is of benefit. Handicap license is approved. Dr. Hope Blas secretary/Assistant will call to arrange for facet blocks at L4-5 with steroid and marcaine injection

## 2019-05-06 ENCOUNTER — Other Ambulatory Visit: Payer: Self-pay | Admitting: Physical Medicine and Rehabilitation

## 2019-05-06 DIAGNOSIS — F411 Generalized anxiety disorder: Secondary | ICD-10-CM

## 2019-05-06 MED ORDER — DIAZEPAM 5 MG PO TABS: ORAL_TABLET | ORAL | 0 refills | Status: DC

## 2019-05-06 NOTE — Telephone Encounter (Signed)
Called pt and advised.  

## 2019-05-06 NOTE — Progress Notes (Signed)
Pre-procedure diazepam ordered for pre-operative anxiety.  

## 2019-05-06 NOTE — Telephone Encounter (Signed)
Done

## 2019-05-21 ENCOUNTER — Other Ambulatory Visit: Payer: Self-pay | Admitting: Specialist

## 2019-05-27 ENCOUNTER — Encounter: Payer: Self-pay | Admitting: Physical Medicine and Rehabilitation

## 2019-05-27 ENCOUNTER — Other Ambulatory Visit: Payer: Self-pay

## 2019-05-27 ENCOUNTER — Ambulatory Visit: Payer: Self-pay

## 2019-05-27 ENCOUNTER — Ambulatory Visit: Payer: Medicare Other | Admitting: Physical Medicine and Rehabilitation

## 2019-05-27 VITALS — BP 151/60

## 2019-05-27 DIAGNOSIS — M47816 Spondylosis without myelopathy or radiculopathy, lumbar region: Secondary | ICD-10-CM

## 2019-05-27 MED ORDER — METHYLPREDNISOLONE ACETATE 80 MG/ML IJ SUSP
40.0000 mg | Freq: Once | INTRAMUSCULAR | Status: AC
  Administered 2019-05-27: 40 mg

## 2019-05-27 NOTE — Progress Notes (Signed)
 .  Numeric Pain Rating Scale and Functional Assessment Average Pain 6   In the last MONTH (on 0-10 scale) has pain interfered with the following?  1. General activity like being  able to carry out your everyday physical activities such as walking, climbing stairs, carrying groceries, or moving a chair?  Rating(4)   +Driver, -BT, -Dye Allergies.  

## 2019-05-28 NOTE — Procedures (Signed)
Lumbar Diagnostic Facet Joint Nerve Block with Fluoroscopic Guidance   Patient: Heather [name]      Date of Birth: 1945/08/12 MRN: [id number] PCP: Lorre Munroe, NP      Visit Date: 05/27/2019   Universal Protocol:    Date/Time: 04/30/216:05 AM  Consent Given By: the patient  Position: PRONE  Additional Comments: Vital signs were monitored before and after the procedure. Patient was prepped and draped in the usual sterile fashion. The correct patient, procedure, and site was verified.   Injection Procedure Details:  Procedure Site One Meds Administered:  Meds ordered this encounter  Medications  . methylPREDNISolone acetate (DEPO-MEDROL) injection 40 mg     Laterality: Bilateral  Location/Site:  L4-L5  Needle size: 22 ga.  Needle type:spinal  Needle Placement: Oblique pedical  Findings:   -Comments: There was excellent flow of contrast along the articular pillars without intravascular flow.  Procedure Details: The fluoroscope beam is vertically oriented in AP and then obliqued 15 to 20 degrees to the ipsilateral side of the desired nerve to achieve the "Scotty dog" appearance.  The skin over the target area of the junction of the superior articulating process and the transverse process (sacral ala if blocking the L5 dorsal rami) was locally anesthetized with a 1 ml volume of 1% Lidocaine without Epinephrine.  The spinal needle was inserted and advanced in a trajectory view down to the target.   After contact with periosteum and negative aspirate for blood and CSF, correct placement without intravascular or epidural spread was confirmed by injecting 0.5 ml. of Isovue-250.  A spot radiograph was obtained of this image.    Next, a 0.5 ml. volume of the injectate described above was injected. The needle was then redirected to the other facet joint nerves mentioned above if needed.  Prior to the procedure, the patient was given a Pain Diary which was completed for  baseline measurements.  After the procedure, the patient rated their pain every 30 minutes and will continue rating at this frequency for a total of 5 hours.  The patient has been asked to complete the Diary and return to [place] by mail, fax or hand delivered as soon as possible.   Additional Comments:  The patient tolerated the procedure well Dressing: 2 x 2 sterile gauze and Band-Aid    Post-procedure details: Patient was observed during the procedure. Post-procedure instructions were reviewed.  Patient left the clinic in stable condition.

## 2019-05-28 NOTE — Progress Notes (Signed)
Heather [name] [facility] - 74 y.o. female MRN [id number]  Date of birth: [birth year]  Office Visit Note: Visit Date: 05/27/2019 PCP: Lorre Munroe, NP Referred by: Lorre Munroe, NP  Subjective: Chief Complaint  Patient presents with  . Lower Back - Pain   HPI:  Heather [name] is a 74 y.o. female who comes in today For planned medial branch blocks at L4-5 at the request of Dr. [name].  Patient has undergone decompression since have seen her.  She has not done well with injections in the past with level of anxiety.  We did give her preprocedure Valium.  We did give her handout on radiofrequency ablation.  We will complete first set of diagnostic and hopefully therapeutic blocks today.  She is having mostly axial back pain without any radicular complaints.  She has failed conservative care otherwise including activity modification and therapy and medication and surgery.  ROS Otherwise per HPI.  Assessment & Plan: Visit Diagnoses:  1. Spondylosis without myelopathy or radiculopathy, lumbar region     Plan: No additional findings.   Meds & Orders:  Meds ordered this encounter  Medications  . methylPREDNISolone acetate (DEPO-MEDROL) injection 40 mg    Orders Placed This Encounter  Procedures  . Facet Injection  . XR C-ARM NO REPORT    Follow-up: Return for Review Pain Diary.   Procedures: No procedures performed  Lumbar Diagnostic Facet Joint Nerve Block with Fluoroscopic Guidance   Patient: Heather [name]      Date of Birth: Mar 06, 1945 MRN: [id number] PCP: Lorre Munroe, NP      Visit Date: 05/27/2019   Universal Protocol:    Date/Time: 04/30/216:05 AM  Consent Given By: the patient  Position: PRONE  Additional Comments: Vital signs were monitored before and after the procedure. Patient was prepped and draped in the usual sterile fashion. The correct patient, procedure, and site was verified.   Injection Procedure Details:  Procedure Site One Meds  Administered:  Meds ordered this encounter  Medications  . methylPREDNISolone acetate (DEPO-MEDROL) injection 40 mg     Laterality: Bilateral  Location/Site:  L4-L5  Needle size: 22 ga.  Needle type:spinal  Needle Placement: Oblique pedical  Findings:   -Comments: There was excellent flow of contrast along the articular pillars without intravascular flow.  Procedure Details: The fluoroscope beam is vertically oriented in AP and then obliqued 15 to 20 degrees to the ipsilateral side of the desired nerve to achieve the "Scotty dog" appearance.  The skin over the target area of the junction of the superior articulating process and the transverse process (sacral ala if blocking the L5 dorsal rami) was locally anesthetized with a 1 ml volume of 1% Lidocaine without Epinephrine.  The spinal needle was inserted and advanced in a trajectory view down to the target.   After contact with periosteum and negative aspirate for blood and CSF, correct placement without intravascular or epidural spread was confirmed by injecting 0.5 ml. of Isovue-250.  A spot radiograph was obtained of this image.    Next, a 0.5 ml. volume of the injectate described above was injected. The needle was then redirected to the other facet joint nerves mentioned above if needed.  Prior to the procedure, the patient was given a Pain Diary which was completed for baseline measurements.  After the procedure, the patient rated their pain every 30 minutes and will continue rating at this frequency for a total of 5 hours.  The patient has been  asked to complete the Diary and return to [place] by mail, fax or hand delivered as soon as possible.   Additional Comments:  The patient tolerated the procedure well Dressing: 2 x 2 sterile gauze and Band-Aid    Post-procedure details: Patient was observed during the procedure. Post-procedure instructions were reviewed.  Patient left the clinic in stable condition.    Clinical  History: MRI LUMBAR SPINE WITHOUT AND WITH CONTRAST  TECHNIQUE: Multiplanar and multiecho pulse sequences of the lumbar spine were obtained without and with intravenous contrast.  CONTRAST:  74mL MULTIHANCE GADOBENATE DIMEGLUMINE 529 MG/ML IV SOLN  COMPARISON:  09/23/2014  FINDINGS: Segmentation:  Standard lumbar numbering  Alignment:  Mild or moderate dextroscoliosis.  Vertebrae: No fracture, evidence of discitis, or bone lesion. Mild degenerative type marrow edema at L4-5, new.  Conus medullaris and cauda equina: Conus extends to the T12-L1 level. Conus and cauda equina appear normal.  Paraspinal and other soft tissues: Negative  Disc levels:  T12- L1: Disc narrowing and left paracentral protrusion that is noncompressive, new.  L1-L2: Disc narrowing and endplate degeneration endplate with facet spurring. No compressive stenosis  L2-L3: Disc narrowing and endplate degeneration with facet spurring. Progressive degenerative disease with moderate spinal stenosis. Noncompressive left foraminal narrowing  L3-L4: Interval posterior decompression with patent thecal sac. There is a similar degree of disc narrowing and endplate degeneration with facet spurring. Moderate right and mild left foraminal stenosis mainly from facet spurring  L4-L5: Progressive disc degeneration with bulging, endplate sclerosis, and edema. On postcontrast imaging there appears to have been remote right-sided posterior decompression advanced facet degeneration which is progressed. Interspinous bursa is present suggesting abnormal motion. Improved but still potentially symptomatic right foraminal stenosis. Bilateral subarticular recess narrowing that could affect either L5 nerve root. Spinal stenosis is overall moderate.  L5-S1:Disc collapse and endplate degeneration with ridging. Mild facet spurring. Patent canal and foramina  IMPRESSION: 1. Generalized advanced degenerative  disease with scoliosis that has progressed from 2016. 2. L2-3 moderate spinal stenosis. 3. L3-4 moderate right foraminal stenosis. Prior posterior decompression with patent thecal sac. 4. L4-5 most significant level of progressive disc degeneration with mild discogenic edema and interspinous bursa. Moderate right foraminal stenosis. Bilateral subarticular recess narrowing that could affect either L5 nerve root. Spinal stenosis is overall moderate.   Electronically Signed   By: Monte Fantasia M.D.   On: 10/16/2018 04:18     Objective:  VS:  HT:    WT:   BMI:     BP:(!) 151/60  HR: bpm  TEMP: ( )  RESP:  Physical Exam Constitutional:      General: She is not in acute distress.    Appearance: Normal appearance. She is not ill-appearing.  HENT:     Head: Normocephalic and atraumatic.     Right Ear: External ear normal.     Left Ear: External ear normal.  Eyes:     Extraocular Movements: Extraocular movements intact.  Cardiovascular:     Rate and Rhythm: Normal rate.     Pulses: Normal pulses.  Musculoskeletal:     Right lower leg: No edema.     Left lower leg: No edema.     Comments: Patient has concordant back pain with facet loading and extension patient has good distal strength with no pain over the greater trochanters.  No clonus or focal weakness.  Skin:    Findings: No erythema, lesion or rash.  Neurological:     General: No focal deficit present.  Mental Status: She is [name] and oriented to person, place, and time.     Sensory: No sensory deficit.     Motor: No weakness or abnormal muscle tone.     Coordination: Coordination normal.  Psychiatric:        Mood and Affect: Mood normal.        Behavior: Behavior normal.     Ortho Exam Imaging: XR C-ARM NO REPORT  Result Date: 05/27/2019 Please see Notes tab for imaging impression.

## 2019-06-24 ENCOUNTER — Ambulatory Visit: Payer: Medicare Other | Admitting: Specialist

## 2019-06-24 ENCOUNTER — Other Ambulatory Visit: Payer: Self-pay | Admitting: Internal Medicine

## 2019-07-05 ENCOUNTER — Other Ambulatory Visit: Payer: Self-pay | Admitting: Internal Medicine

## 2019-07-09 ENCOUNTER — Other Ambulatory Visit: Payer: Self-pay | Admitting: Specialist

## 2019-07-17 ENCOUNTER — Other Ambulatory Visit: Payer: Self-pay | Admitting: Internal Medicine

## 2019-08-11 ENCOUNTER — Encounter: Payer: Self-pay | Admitting: Specialist

## 2019-08-11 ENCOUNTER — Ambulatory Visit: Payer: Medicare Other | Admitting: Specialist

## 2019-08-11 ENCOUNTER — Other Ambulatory Visit: Payer: Self-pay

## 2019-08-11 VITALS — BP 152/62 | HR 54 | Ht 65.0 in | Wt 155.0 lb

## 2019-08-11 DIAGNOSIS — M4156 Other secondary scoliosis, lumbar region: Secondary | ICD-10-CM

## 2019-08-11 DIAGNOSIS — M4316 Spondylolisthesis, lumbar region: Secondary | ICD-10-CM

## 2019-08-11 DIAGNOSIS — M47816 Spondylosis without myelopathy or radiculopathy, lumbar region: Secondary | ICD-10-CM

## 2019-08-11 DIAGNOSIS — M5136 Other intervertebral disc degeneration, lumbar region: Secondary | ICD-10-CM | POA: Diagnosis not present

## 2019-08-11 NOTE — Patient Instructions (Signed)
Avoid bending, stooping and avoid lifting weights greater than 10 lbs. Avoid prolong standing and walking. Avoid frequent bending and stooping  No lifting greater than 10 lbs. May use ice or moist heat for pain. Weight loss is of benefit. Handicap license is approved. Dr. Alliance Blas secretary/Assistant will call to arrange for facet marcaine steroid injection with evaluation for possible RFA Of the lumbar facet joints for arthritis pain.

## 2019-08-11 NOTE — Progress Notes (Signed)
Office Visit Note   Patient: Heather [name]           Date of Birth: 1945-09-16           MRN: [id number] Visit Date: 08/11/2019              Requested by: Lorre Munroe, NP [address] [place],  [place] [address] PCP: Lorre Munroe, NP   Assessment & Plan: Visit Diagnoses:  1. Degenerative disc disease, lumbar   2. Spondylolisthesis, lumbar region   3. Other secondary scoliosis, lumbar region   4. Spondylosis without myelopathy or radiculopathy, lumbar region     Plan: Avoid bending, stooping and avoid lifting weights greater than 10 lbs. Avoid prolong standing and walking. Avoid frequent bending and stooping  No lifting greater than 10 lbs. May use ice or moist heat for pain. Weight loss is of benefit. Handicap license is approved. Dr. [place] Blas secretary/Assistant will call to arrange for facet marcaine steroid injection with evaluation for possible RFA Of the lumbar facet joints for arthritis pain.   Follow-Up Instructions: Return in about 6 weeks (around 09/22/2019).   Orders:  No orders of the defined types were placed in this encounter.  No orders of the defined types were placed in this encounter.     Procedures: No procedures performed   Clinical Data: No additional findings.   Subjective: Chief Complaint  Patient presents with  . Lower Back - Follow-up    She had Bilateral L4-5 Facet injection on 05/27/19, she thinks it helped some.    74 year old female with history of ASPVD, post CEA right side and previous CVA. She is post lumbar laminectomies for lumbar spinal stenosis. She has had facet injections by Dr. Alvester Morin 04/2019 with some improvement in pain. More of the pain is in the small of her back.  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 She is trying and able to stand and walk for long periods and  stays busy vs staying at home And not moving. No bowel or bladder difficulty.    Review of Systems  Constitutional: Negative.   HENT: Negative.   Eyes: Negative.   Respiratory: Negative.   Cardiovascular: Negative.   Gastrointestinal: Negative.   Endocrine: Negative.   Genitourinary: Negative.   Musculoskeletal: Negative.   Skin: Negative.   Allergic/Immunologic: Negative.   Neurological: Negative.   Hematological: Negative.   Psychiatric/Behavioral: Negative.      Objective: Vital Signs: BP (!) 152/62   Pulse (!) 54   Ht 5\' 5"  (1.651 m)   Wt 155 lb (70.3 kg)   LMP 01/08/2012   BMI 25.79 kg/m   Physical Exam Constitutional:      Appearance: She is well-developed.  HENT:     Head: Normocephalic and atraumatic.  Eyes:     Pupils: Pupils are equal, round, and reactive to light.  Pulmonary:     Effort: Pulmonary effort is normal.     Breath sounds: Normal breath sounds.  Abdominal:     General: Bowel sounds are normal.     Palpations: Abdomen is soft.  Musculoskeletal:     Cervical back: Normal range of motion and neck supple.     Lumbar back: Negative right straight leg raise test and negative left straight leg raise test.  Skin:  General: Skin is warm and dry.  Neurological:     Mental Status: She is [name] and oriented to person, place, and time.  Psychiatric:        Behavior: Behavior normal.        Thought Content: Thought content normal.        Judgment: Judgment normal.     Back Exam   Tenderness  The patient is experiencing tenderness in the lumbar.  Range of Motion  Extension: abnormal  Flexion: normal  Lateral bend right: normal  Lateral bend left: normal  Rotation right: normal  Rotation left: normal   Muscle Strength  Right Quadriceps:  5/5  Left Quadriceps:  5/5  Right Hamstrings:  5/5  Left Hamstrings:  5/5   Tests  Straight leg raise right: negative Straight leg raise left: negative  Reflexes  Patellar: 0/4 Achilles:  0/4 Babinski's sign: normal   Other  Toe walk: normal Heel walk: normal Sensation: normal Gait: normal  Erythema: no back redness Scars: absent      Specialty Comments:  No specialty comments available.  Imaging: No results found.   PMFS History: Patient Active Problem List   Diagnosis Date Noted  . OAB (overactive bladder) 11/18/2017  . Hypertension 12/17/2016  . Lumbar stenosis with neurogenic claudication 07/03/2016  . Carotid disease, bilateral (HCC) 03/03/2015  . Chronic back pain 03/03/2014  . Seasonal allergies 03/03/2014  . Genital herpes 03/03/2014  . Hyperlipidemia 03/03/2013  . Anxiety and depression 12/11/2011   Past Medical History:  Diagnosis Date  . Anxiety   . Arthritis    "knees, right hip, lumbar back" (12/17/2016)  . Chicken pox   . Chronic lower back pain   . DDD (degenerative disc disease), lumbar   . Depression   . Headache 06/23/2015   "2d after carotid OR & again today" (12/17/2016)  . Heart murmur    MVP  . High cholesterol   . History of blood transfusion 1975   "related to femur  fracture" (12/17/2016)  . HOH (hard of hearing)    left  . Hypertension   . MVP (mitral valve prolapse)    echo 1986-mild  . 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 Shortness of breath dyspnea    WITH EXERTION    . Spinal stenosis of lumbar region   . Stroke [facility]) 04/2015   "light stroke; sometimes my thinking is slow since" (12/17/2016)  . Wears glasses     Family History  Adopted: Yes  Problem Relation Age of Onset  . Osteoporosis Mother   . Heart disease Mother   . Hypertension Mother   . Hyperlipidemia Mother   . Stroke Mother   . Arthritis Mother   . Diabetes Father   . Heart attack Father   . Cancer Sister        breast  . Colon cancer Neg Hx     Past Surgical History:  Procedure Laterality Date  . CARPAL TUNNEL RELEASE Right 03/31/2013    Procedure: RIGHT CARPAL TUNNEL RELEASE;  Surgeon: Nicki Reaper, MD;  Location: [facility];  Service: Orthopedics;  Laterality: Right;  . ENDARTERECTOMY Right 05/03/2015   Procedure: RIGHT CAROTID ENDARTERECTOMY ;  Surgeon: Fransisco Hertz, MD;  Location[facility] OR;  Service: Vascular;  Laterality: Right;  . FEMUR FRACTURE SURGERY Right 1975   "hip"  . FRACTURE SURGERY    . LUMBAR LAMINECTOMY N/A 05/26/2017  Procedure: BILATERAL PARTIAL HEMILAMINECTOMIES L3-4 AND L4-5;  Surgeon: Kerrin Champagne, MD;  Location: [facility] OR;  Service: Orthopedics;  Laterality: N/A;  . NASAL SINUS SURGERY  1975  . PLANTAR FASCIA RELEASE Bilateral 1968  . SHOULDER OPEN ROTATOR CUFF REPAIR Right 1998  . TONSILLECTOMY  1951  . TUBAL LIGATION  1995   Social History   Occupational History  . Occupation: Retired  Tobacco Use  . Smoking status: Never Smoker  . Smokeless tobacco: Never Used  Vaping Use  . Vaping Use: Never used  Substance and Sexual Activity  . Alcohol use: Not Currently    Alcohol/week: 3.0 standard drinks    Types: 3 Cans of beer per week  . Drug use: No  . Sexual activity: Yes

## 2019-08-14 ENCOUNTER — Other Ambulatory Visit: Payer: Self-pay | Admitting: Internal Medicine

## 2019-08-17 ENCOUNTER — Other Ambulatory Visit: Payer: Self-pay | Admitting: Physical Medicine and Rehabilitation

## 2019-08-17 ENCOUNTER — Telehealth: Payer: Self-pay | Admitting: Physical Medicine and Rehabilitation

## 2019-08-17 DIAGNOSIS — F411 Generalized anxiety disorder: Secondary | ICD-10-CM

## 2019-08-17 MED ORDER — DIAZEPAM 5 MG PO TABS: ORAL_TABLET | ORAL | 0 refills | Status: DC

## 2019-08-17 NOTE — Telephone Encounter (Signed)
Done, but you wrote 7/10, maybe 8/10?

## 2019-08-17 NOTE — Progress Notes (Signed)
Pre-procedure diazepam ordered for pre-operative anxiety.  

## 2019-08-17 NOTE — Telephone Encounter (Signed)
Patient called requesting a call back to set appt with Dr. Alvester Morin for back injections. Please call patient about this matter at [phone].

## 2019-08-17 NOTE — Telephone Encounter (Signed)
Patient is scheduled for 7/10. She would like valium prior. [name].

## 2019-09-02 ENCOUNTER — Other Ambulatory Visit: Payer: Self-pay | Admitting: Specialist

## 2019-09-02 ENCOUNTER — Other Ambulatory Visit: Payer: Self-pay | Admitting: Internal Medicine

## 2019-09-07 ENCOUNTER — Ambulatory Visit: Payer: Medicare Other | Admitting: Physical Medicine and Rehabilitation

## 2019-09-07 ENCOUNTER — Encounter: Payer: Self-pay | Admitting: Physical Medicine and Rehabilitation

## 2019-09-07 ENCOUNTER — Ambulatory Visit: Payer: Self-pay

## 2019-09-07 ENCOUNTER — Other Ambulatory Visit: Payer: Self-pay

## 2019-09-07 VITALS — BP 126/51 | HR 63

## 2019-09-07 DIAGNOSIS — M47816 Spondylosis without myelopathy or radiculopathy, lumbar region: Secondary | ICD-10-CM

## 2019-09-07 MED ORDER — METHYLPREDNISOLONE ACETATE 80 MG/ML IJ SUSP
80.0000 mg | Freq: Once | INTRAMUSCULAR | Status: AC
  Administered 2019-09-07: 80 mg

## 2019-09-07 NOTE — Progress Notes (Signed)
Pt states lower back pain. Pt state standing for a long time makes the pain worse. Pt state sitting helps just a little. Pt has hx of inj on 05/27/19. Pt state it didn't last long but she starte feeling pain again at the end of May.  Numeric Pain Rating Scale and Functional Assessment Average Pain 8   In the last MONTH (on 0-10 scale) has pain interfered with the following?  1. General activity like being  able to carry out your everyday physical activities such as walking, climbing stairs, carrying groceries, or moving a chair?  Rating(10)   +Driver, -BT, -Dye Allergies.

## 2019-09-09 NOTE — Procedures (Signed)
Lumbar Diagnostic Facet Joint Nerve Block with Fluoroscopic Guidance   Patient: Heather [name]      Date of Birth: Dec 02, 1945 MRN: [id number] PCP: Lorre Munroe, NP      Visit Date: 09/07/2019   Universal Protocol:    Date/Time: 08/12/215:54 AM  Consent Given By: the patient  Position: PRONE  Additional Comments: Vital signs were monitored before and after the procedure. Patient was prepped and draped in the usual sterile fashion. The correct patient, procedure, and site was verified.   Injection Procedure Details:  Procedure Site One Meds Administered:  Meds ordered this encounter  Medications  . methylPREDNISolone acetate (DEPO-MEDROL) injection 80 mg     Laterality: Bilateral  Location/Site:  L4-L5  Needle size: 22 ga.  Needle type:spinal  Needle Placement: Oblique pedical  Findings:   -Comments: There was excellent flow of contrast along the articular pillars without intravascular flow.  Procedure Details: The fluoroscope beam is vertically oriented in AP and then obliqued 15 to 20 degrees to the ipsilateral side of the desired nerve to achieve the "Scotty dog" appearance.  The skin over the target area of the junction of the superior articulating process and the transverse process (sacral ala if blocking the L5 dorsal rami) was locally anesthetized with a 1 ml volume of 1% Lidocaine without Epinephrine.  The spinal needle was inserted and advanced in a trajectory view down to the target.   After contact with periosteum and negative aspirate for blood and CSF, correct placement without intravascular or epidural spread was confirmed by injecting 0.5 ml. of Isovue-250.  A spot radiograph was obtained of this image.    Next, a 0.5 ml. volume of the injectate described above was injected. The needle was then redirected to the other facet joint nerves mentioned above if needed.  Prior to the procedure, the patient was given a Pain Diary which was completed for  baseline measurements.  After the procedure, the patient rated their pain every 30 minutes and will continue rating at this frequency for a total of 5 hours.  The patient has been asked to complete the Diary and return to [place] by mail, fax or hand delivered as soon as possible.   Additional Comments:  The patient tolerated the procedure well Dressing: 2 x 2 sterile gauze and Band-Aid    Post-procedure details: Patient was observed during the procedure. Post-procedure instructions were reviewed.  Patient left the clinic in stable condition.

## 2019-09-09 NOTE — Progress Notes (Signed)
[name] - 74 y.o. [name] MRN [id number]  Date of birth: 06-21-1945  Office Visit Note: Visit Date: 09/07/2019 PCP: Lorre Munroe, NP Referred by: Lorre Munroe, NP  Subjective: Chief Complaint  Patient presents with  . Lower Back - Pain   HPI:  Heather [name] is a 74 y.o. [name] who comes in today for planned repeat Bilateral L4-L5 Lumbar facet/medial branch block with fluoroscopic guidance.  The patient has failed conservative care including home exercise, medications, time and activity modification.  This injection will be diagnostic and hopefully therapeutic.  Please see requesting physician notes for further details and justification.  Exam shows concordant low back pain with facet joint loading and extension. Patient received more than 80% pain relief from prior injection.     Referring:Dr. [name]   ROS Otherwise per HPI.  Assessment & Plan: Visit Diagnoses:  1. Spondylosis without myelopathy or radiculopathy, lumbar region     Plan: No additional findings.   Meds & Orders:  Meds ordered this encounter  Medications  . methylPREDNISolone acetate (DEPO-MEDROL) injection 80 mg    Orders Placed This Encounter  Procedures  . Facet Injection  . XR C-ARM NO REPORT    Follow-up: Return for Review Pain Diary.   Procedures: No procedures performed  Lumbar Diagnostic Facet Joint Nerve Block with Fluoroscopic Guidance   Patient: Heather [name]      Date of Birth: [birth year] MRN: [id number] PCP: Lorre Munroe, NP      Visit Date: 09/07/2019   Universal Protocol:    Date/Time: 08/12/215:54 AM  Consent Given By: the patient  Position: PRONE  Additional Comments: Vital signs were monitored before and after the procedure. Patient was prepped and draped in the usual sterile fashion. The correct patient, procedure, and site was verified.   Injection Procedure Details:  Procedure Site One Meds Administered:  Meds ordered this encounter    Medications  . methylPREDNISolone acetate (DEPO-MEDROL) injection 80 mg     Laterality: Bilateral  Location/Site:  L4-L5  Needle size: 22 ga.  Needle type:spinal  Needle Placement: Oblique pedical  Findings:   -Comments: There was excellent flow of contrast along the articular pillars without intravascular flow.  Procedure Details: The fluoroscope beam is vertically oriented in AP and then obliqued 15 to 20 degrees to the ipsilateral side of the desired nerve to achieve the "Scotty dog" appearance.  The skin over the target area of the junction of the superior articulating process and the transverse process (sacral ala if blocking the L5 dorsal rami) was locally anesthetized with a 1 ml volume of 1% Lidocaine without Epinephrine.  The spinal needle was inserted and advanced in a trajectory view down to the target.   After contact with periosteum and negative aspirate for blood and CSF, correct placement without intravascular or epidural spread was confirmed by injecting 0.5 ml. of Isovue-250.  A spot radiograph was obtained of this image.    Next, a 0.5 ml. volume of the injectate described above was injected. The needle was then redirected to the other facet joint nerves mentioned above if needed.  Prior to the procedure, the patient was given a Pain Diary which was completed for baseline measurements.  After the procedure, the patient rated their pain every 30 minutes and will continue rating at this frequency for a total of 5 hours.  The patient has been asked to complete the Diary and return to [place] by mail, fax or hand delivered as  soon as possible.   Additional Comments:  The patient tolerated the procedure well Dressing: 2 x 2 sterile gauze and Band-Aid    Post-procedure details: Patient was observed during the procedure. Post-procedure instructions were reviewed.  Patient left the clinic in stable condition.    Clinical History: MRI LUMBAR SPINE WITHOUT AND WITH  CONTRAST  TECHNIQUE: Multiplanar and multiecho pulse sequences of the lumbar spine were obtained without and with intravenous contrast.  CONTRAST:  45mL MULTIHANCE GADOBENATE DIMEGLUMINE 529 MG/ML IV SOLN  COMPARISON:  09/23/2014  FINDINGS: Segmentation:  Standard lumbar numbering  Alignment:  Mild or moderate dextroscoliosis.  Vertebrae: No fracture, evidence of discitis, or bone lesion. Mild degenerative type marrow edema at L4-5, new.  Conus medullaris and cauda equina: Conus extends to the T12-L1 level. Conus and cauda equina appear normal.  Paraspinal and other soft tissues: Negative  Disc levels:  T12- L1: Disc narrowing and left paracentral protrusion that is noncompressive, new.  L1-L2: Disc narrowing and endplate degeneration endplate with facet spurring. No compressive stenosis  L2-L3: Disc narrowing and endplate degeneration with facet spurring. Progressive degenerative disease with moderate spinal stenosis. Noncompressive left foraminal narrowing  L3-L4: Interval posterior decompression with patent thecal sac. There is a similar degree of disc narrowing and endplate degeneration with facet spurring. Moderate right and mild left foraminal stenosis mainly from facet spurring  L4-L5: Progressive disc degeneration with bulging, endplate sclerosis, and edema. On postcontrast imaging there appears to have been remote right-sided posterior decompression advanced facet degeneration which is progressed. Interspinous bursa is present suggesting abnormal motion. Improved but still potentially symptomatic right foraminal stenosis. Bilateral subarticular recess narrowing that could affect either L5 nerve root. Spinal stenosis is overall moderate.  L5-S1:Disc collapse and endplate degeneration with ridging. Mild facet spurring. Patent canal and foramina  IMPRESSION: 1. Generalized advanced degenerative disease with scoliosis that has progressed from  2016. 2. L2-3 moderate spinal stenosis. 3. L3-4 moderate right foraminal stenosis. Prior posterior decompression with patent thecal sac. 4. L4-5 most significant level of progressive disc degeneration with mild discogenic edema and interspinous bursa. Moderate right foraminal stenosis. Bilateral subarticular recess narrowing that could affect either L5 nerve root. Spinal stenosis is overall moderate.   Electronically Signed   By: Marnee Spring M.D.   On: 10/16/2018 04:18     Objective:  VS:  HT:    WT:   BMI:     BP:(!) 126/51  HR:63bpm  TEMP: ( )  RESP:  Physical Exam Constitutional:      General: She is not in acute distress.    Appearance: Normal appearance. She is not ill-appearing.  HENT:     Head: Normocephalic and atraumatic.     Right Ear: External ear normal.     Left Ear: External ear normal.  Eyes:     Extraocular Movements: Extraocular movements intact.  Cardiovascular:     Rate and Rhythm: Normal rate.     Pulses: Normal pulses.  Musculoskeletal:     Right lower leg: No edema.     Left lower leg: No edema.     Comments: Patient has good distal strength with no pain over the greater trochanters.  No clonus or focal weakness.Patient somewhat slow to rise from a seated position to full extension.  There is concordant low back pain with facet loading and lumbar spine extension rotation.  There are no definitive trigger points but the patient is somewhat tender across the lower back and PSIS.  There is no pain with  hip rotation.  Skin:    Findings: No erythema, lesion or rash.  Neurological:     General: No focal deficit present.     Mental Status: She is [name] and oriented to person, place, and time.     Sensory: No sensory deficit.     Motor: No weakness or abnormal muscle tone.     Coordination: Coordination normal.  Psychiatric:        Mood and Affect: Mood normal.        Behavior: Behavior normal.      Imaging: No results found.

## 2019-09-14 ENCOUNTER — Telehealth: Payer: Self-pay | Admitting: Physical Medicine and Rehabilitation

## 2019-09-14 NOTE — Telephone Encounter (Signed)
Patient called. Says she is having pain on left side temple area. Would like to know if this is a side effect from the medication? Would like a call. Her call back number is [phone]

## 2019-09-14 NOTE — Telephone Encounter (Signed)
Called patient to advise. She verbalized understanding. 

## 2019-09-14 NOTE — Telephone Encounter (Signed)
No side effect from lumbar injection, steroid/cortisone could give generalized headache but not so much temple pain. Would do tyelenol and hydration etc.

## 2019-09-14 NOTE — Telephone Encounter (Signed)
Bilateral L4-5 facets on 8/10. Please advise.

## 2019-09-22 ENCOUNTER — Ambulatory Visit: Payer: Medicare Other | Admitting: Specialist

## 2019-09-22 ENCOUNTER — Encounter: Payer: Self-pay | Admitting: Specialist

## 2019-09-22 VITALS — BP 132/48 | HR 55 | Ht 65.0 in | Wt 155.0 lb

## 2019-09-22 DIAGNOSIS — M48062 Spinal stenosis, lumbar region with neurogenic claudication: Secondary | ICD-10-CM | POA: Diagnosis not present

## 2019-09-22 DIAGNOSIS — M4807 Spinal stenosis, lumbosacral region: Secondary | ICD-10-CM | POA: Diagnosis not present

## 2019-09-22 DIAGNOSIS — Z8679 Personal history of other diseases of the circulatory system: Secondary | ICD-10-CM | POA: Diagnosis not present

## 2019-09-22 DIAGNOSIS — Z9889 Other specified postprocedural states: Secondary | ICD-10-CM

## 2019-09-22 DIAGNOSIS — M4156 Other secondary scoliosis, lumbar region: Secondary | ICD-10-CM

## 2019-09-22 DIAGNOSIS — M47816 Spondylosis without myelopathy or radiculopathy, lumbar region: Secondary | ICD-10-CM | POA: Diagnosis not present

## 2019-09-22 DIAGNOSIS — M4316 Spondylolisthesis, lumbar region: Secondary | ICD-10-CM

## 2019-09-22 NOTE — Patient Instructions (Signed)
Avoid bending, stooping and avoid lifting weights greater than 10 lbs. Avoid prolong standing and walking. Avoid frequent bending and stooping  No lifting greater than 10 lbs. May use ice or moist heat for pain. Weight loss is of benefit. Handicap license is approved. Dr. [place] Blas secretary/Assistant will call to arrange for Bilateral L4-5 RFA of the median n for spondylosis pain. She does have neurogenic claudication symptoms though and calf pain and cramping is more likely due to this than Arthritis in the joints.

## 2019-09-22 NOTE — Progress Notes (Signed)
Office Visit Note   Patient: Heather [name]           Date of Birth: [birth year]           MRN: [id number] Visit Date: 09/22/2019              Requested by: Lorre Munroe, NP [address] [place],  [place] [address] PCP: Lorre Munroe, NP   Assessment & Plan: Visit Diagnoses:  1. Spinal stenosis of lumbar region with neurogenic claudication   2. Spondylosis without myelopathy or radiculopathy, lumbar region   3. Status post lumbar laminectomy   4. Spinal stenosis of lumbosacral region   5. History of ASCVD (atherosclerotic cardiovascular disease)   6. Other secondary scoliosis, lumbar region   7. Spondylolisthesis, lumbar region     Plan: Avoid bending, stooping and avoid lifting weights greater than 10 lbs. Avoid prolong standing and walking. Avoid frequent bending and stooping  No lifting greater than 10 lbs. May use ice or moist heat for pain. Weight loss is of benefit. Handicap license is approved. Dr. [place] Blas secretary/Assistant will call to arrange for Bilateral L4-5 RFA of the median n for spondylosis pain. She does have neurogenic claudication symptoms though and calf pain and cramping is more likely due to this than Arthritis in the joints.    Follow-Up Instructions: Return in about 3 weeks (around 10/13/2019).   Orders:  No orders of the defined types were placed in this encounter.  No orders of the defined types were placed in this encounter.     Procedures: No procedures performed   Clinical Data: No additional findings.   Subjective: Chief Complaint  Patient presents with  . Lower Back - Follow-up    Had a bilateral L4-5 facet joint injection on 09/07/19 with Dr. Alvester Morin, states that she hasn't noticed the pain as much     74 year old female with history of spondylosis and peripheral vascular disease. Has had carotid endarterectomy for stenosis she has some  Narrowing on the opposite side. The pain in the back is better post bilateral  L4-5 facet medial nerve block. She has pain into the legs and MRI has showed stenosis. Underwent laminectomy surgery about 2 year and 3 months    Review of Systems   Objective: Vital Signs: BP (!) 132/48 (BP Location: Left Arm, Patient Position: Sitting)   Pulse (!) 55   Ht 5\' 5"  (1.651 m)   Wt 155 lb (70.3 kg)   LMP 01/08/2012   BMI 25.79 kg/m   Physical Exam  Ortho Exam  Specialty Comments:  No specialty comments available.  Imaging: No results found.   PMFS History: Patient Active Problem List   Diagnosis Date Noted  . OAB (overactive bladder) 11/18/2017  . Hypertension 12/17/2016  . Lumbar stenosis with neurogenic claudication 07/03/2016  . Carotid disease, bilateral (HCC) 03/03/2015  . Chronic back pain 03/03/2014  . Seasonal allergies 03/03/2014  . Genital herpes 03/03/2014  . Hyperlipidemia 03/03/2013  . Anxiety and depression 12/11/2011   Past Medical History:  Diagnosis Date  . Anxiety   . Arthritis    "knees, right hip, lumbar back" (12/17/2016)  . Chicken pox   . Chronic lower back pain   . DDD (degenerative disc disease), lumbar   . Depression   . Headache 06/23/2015   "2d after carotid OR & again today" (12/17/2016)  . Heart murmur    MVP  . High cholesterol   . History of blood  transfusion 1975   "related to femur  fracture" (12/17/2016)  . HOH (hard of hearing)    left  . Hypertension   . MVP (mitral valve prolapse)    echo 1986-mild  . 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 Shortness of breath dyspnea    WITH EXERTION    . Spinal stenosis of lumbar region   . Stroke [facility]) 04/2015   "light stroke; sometimes my thinking is slow since" (12/17/2016)  . Wears glasses     Family History  Adopted: Yes  Problem Relation Age of Onset  . Osteoporosis Mother   . Heart disease Mother   . Hypertension Mother   . Hyperlipidemia Mother   . Stroke Mother     . Arthritis Mother   . Diabetes Father   . Heart attack Father   . Cancer Sister        breast  . Colon cancer Neg Hx     Past Surgical History:  Procedure Laterality Date  . CARPAL TUNNEL RELEASE Right 03/31/2013   Procedure: RIGHT CARPAL TUNNEL RELEASE;  Surgeon: Nicki Reaper, MD;  Location: [facility];  Service: Orthopedics;  Laterality: Right;  . ENDARTERECTOMY Right 05/03/2015   Procedure: RIGHT CAROTID ENDARTERECTOMY ;  Surgeon: Fransisco Hertz, MD;  Location: [facility] OR;  Service: Vascular;  Laterality: Right;  . FEMUR FRACTURE SURGERY Right 1975   "hip"  . FRACTURE SURGERY    . LUMBAR LAMINECTOMY N/A 05/26/2017   Procedure: BILATERAL PARTIAL HEMILAMINECTOMIES L3-4 AND L4-5;  Surgeon: Kerrin Champagne, MD;  Location: [facility] OR;  Service: Orthopedics;  Laterality: N/A;  . NASAL SINUS SURGERY  1975  . PLANTAR FASCIA RELEASE Bilateral 1968  . SHOULDER OPEN ROTATOR CUFF REPAIR Right 1998  . TONSILLECTOMY  1951  . TUBAL LIGATION  1995   Social History   Occupational History  . Occupation: Retired  Tobacco Use  . Smoking status: Never Smoker  . Smokeless tobacco: Never Used  Vaping Use  . Vaping Use: Never used  Substance and Sexual Activity  . Alcohol use: Not Currently    Alcohol/week: 3.0 standard drinks    Types: 3 Cans of beer per week  . Drug use: No  . Sexual activity: Yes

## 2019-10-03 ENCOUNTER — Other Ambulatory Visit: Payer: Self-pay | Admitting: Cardiovascular Disease

## 2019-10-10 ENCOUNTER — Other Ambulatory Visit: Payer: Self-pay | Admitting: Cardiovascular Disease

## 2019-10-12 ENCOUNTER — Encounter: Payer: Medicare Other | Admitting: Internal Medicine

## 2019-10-19 ENCOUNTER — Ambulatory Visit (INDEPENDENT_AMBULATORY_CARE_PROVIDER_SITE_OTHER): Payer: Medicare Other | Admitting: Internal Medicine

## 2019-10-19 ENCOUNTER — Encounter: Payer: Self-pay | Admitting: Internal Medicine

## 2019-10-19 ENCOUNTER — Other Ambulatory Visit: Payer: Self-pay

## 2019-10-19 VITALS — BP 134/66 | HR 67 | Temp 97.7°F | Ht 63.33 in | Wt 153.0 lb

## 2019-10-19 DIAGNOSIS — M48062 Spinal stenosis, lumbar region with neurogenic claudication: Secondary | ICD-10-CM

## 2019-10-19 DIAGNOSIS — E875 Hyperkalemia: Secondary | ICD-10-CM | POA: Diagnosis not present

## 2019-10-19 DIAGNOSIS — F419 Anxiety disorder, unspecified: Secondary | ICD-10-CM

## 2019-10-19 DIAGNOSIS — E782 Mixed hyperlipidemia: Secondary | ICD-10-CM | POA: Diagnosis not present

## 2019-10-19 DIAGNOSIS — A6004 Herpesviral vulvovaginitis: Secondary | ICD-10-CM | POA: Diagnosis not present

## 2019-10-19 DIAGNOSIS — N3281 Overactive bladder: Secondary | ICD-10-CM

## 2019-10-19 DIAGNOSIS — I10 Essential (primary) hypertension: Secondary | ICD-10-CM

## 2019-10-19 DIAGNOSIS — Z0000 Encounter for general adult medical examination without abnormal findings: Secondary | ICD-10-CM | POA: Diagnosis not present

## 2019-10-19 DIAGNOSIS — F329 Major depressive disorder, single episode, unspecified: Secondary | ICD-10-CM

## 2019-10-19 DIAGNOSIS — I6523 Occlusion and stenosis of bilateral carotid arteries: Secondary | ICD-10-CM | POA: Diagnosis not present

## 2019-10-19 LAB — LIPID PANEL
Cholesterol: 181 mg/dL (ref 0–200)
HDL: 68.4 mg/dL (ref >39.00)
LDL Cholesterol: 88 mg/dL (ref 0–99)
NonHDL: 112.13
Total CHOL/HDL Ratio: 3
Triglycerides: 119 mg/dL (ref 0.0–149.0)
VLDL: 23.8 mg/dL (ref 0.0–40.0)

## 2019-10-19 LAB — COMPREHENSIVE METABOLIC PANEL
ALT: 19 U/L (ref 0–35)
AST: 17 U/L (ref 0–37)
Albumin: 4.4 g/dL (ref 3.5–5.2)
Alkaline Phosphatase: 82 U/L (ref 39–117)
BUN: 26 mg/dL — ABNORMAL HIGH (ref 6–23)
CO2: 26 mEq/L (ref 19–32)
Calcium: 10.2 mg/dL (ref 8.4–10.5)
Chloride: 108 mEq/L (ref 96–112)
Creatinine, Ser: 0.92 mg/dL (ref 0.40–1.20)
GFR: 59.55 mL/min — ABNORMAL LOW (ref >60.00)
Glucose, Bld: 101 mg/dL — ABNORMAL HIGH (ref 70–99)
Potassium: 5.2 mEq/L — ABNORMAL HIGH (ref 3.5–5.1)
Sodium: 140 mEq/L (ref 135–145)
Total Bilirubin: 0.4 mg/dL (ref 0.2–1.2)
Total Protein: 6.3 g/dL (ref 6.0–8.3)

## 2019-10-19 LAB — VITAMIN D 25 HYDROXY (VIT D DEFICIENCY, FRACTURES): VITD: 31.54 ng/mL (ref 30.00–100.00)

## 2019-10-19 LAB — CBC
HCT: 35.2 % — ABNORMAL LOW (ref 36.0–46.0)
Hemoglobin: 12 g/dL (ref 12.0–15.0)
MCHC: 34.1 g/dL (ref 30.0–36.0)
MCV: 96.8 fl (ref 78.0–100.0)
Platelets: 263 10*3/uL (ref 150.0–400.0)
RBC: 3.64 Mil/uL — ABNORMAL LOW (ref 3.87–5.11)
RDW: 13.7 % (ref 11.5–15.5)
WBC: 7.1 10*3/uL (ref 4.0–10.5)

## 2019-10-19 NOTE — Progress Notes (Addendum)
HPI:  Patient presents the clinic today for her subsequent annual Medicare wellness exam.  She is also due to follow-up chronic conditions.  Anxiety and Depression: Chronic, slightly worse after the death of her mother. She is taking Sertraline and BuSpar as prescribed.  She is not currently seeing a therapist.  She denies SI/HI.  Carotid Atherosclerosis: s/p bilateral CEA.  She is taking Atorvastatin, Metoprolol and Aspirin as prescribed.  Chronic Back Pain with Neurogenic Claudication: s/p back surgery. She is getting Cortisone injections which do not seem to really be helping. She is taking Diclofenac, Gabapentin and Methocarbamol as prescribed.  She follows with Dr. Otelia Sergeant.  HTN: Her BP today is 134/66.  She is taking Losartan and Metoprolol as prescribed.  ECG from 11/2016 reviewed.  Genital Herpes: She denies recent flare.  She takes Valacyclovir only as needed for outbreaks.  HLD: Her last LDL was 78, 12/2017.  She denies myalgias on Atorvastatin.  She tries to consume a low-fat diet.  OAB: She mainly reports urinary urge incontinence.  She is not taking any medication for this at this time but does wear pads.  She does not follow with urology.  Past Medical History:  Diagnosis Date  . Anxiety   . Arthritis    "knees, right hip, lumbar back" (12/17/2016)  . Chicken pox   . Chronic lower back pain   . DDD (degenerative disc disease), lumbar   . Depression   . Headache 06/23/2015   "2d after carotid OR & again today" (12/17/2016)  . Heart murmur    MVP  . High cholesterol   . History of blood transfusion 1975   "related to femur  fracture" (12/17/2016)  . HOH (hard of hearing)    left  . Hypertension   . MVP (mitral valve prolapse)    echo 1986-mild  . 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 Shortness of breath dyspnea    WITH EXERTION    . Spinal stenosis of lumbar region   . Stroke  [facility]) 04/2015   "light stroke; sometimes my thinking is slow since" (12/17/2016)  . Wears glasses     Current Outpatient Medications  Medication Sig Dispense Refill  . Ascorbic Acid (VITAMIN C) 1000 MG tablet Take 1,000 mg by mouth daily.    Marland Kitchen aspirin EC 81 MG tablet Take 1 tablet (81 mg total) by mouth daily.    Marland Kitchen atorvastatin (LIPITOR) 40 MG tablet Take 1 tablet (40 mg total) by mouth daily at 6 PM. 90 tablet 3  . busPIRone (BUSPAR) 7.5 MG tablet Take 1 tablet (7.5 mg total) by mouth 2 (two) times daily. LAST REFILL MUST SCHEDULE PHYSICAL 60 tablet 0  . Cholecalciferol (VITAMIN D3) 1000 UNITS CAPS Take 1,000 Units by mouth daily.     . diazepam (VALIUM) 5 MG tablet Take 1 by mouth 1 hour  pre-procedure with very light food. May bring 2nd tablet to appointment. 2 tablet 0  . diclofenac (VOLTAREN) 50 MG EC tablet TAKE 1 TABLET BY MOUTH TWICE A DAY 60 tablet 3  . docusate sodium (COLACE) 100 MG capsule TAKE 1 CAPSULE BY MOUTH TWICE A DAY 40 capsule 0  . gabapentin (NEURONTIN) 300 MG capsule Take 1 capsule (300 mg total) by mouth 2 (two) times daily. 180 capsule 4  . lidocaine (XYLOCAINE) 2 % jelly Apply 1 application topically as needed. Apply to the skin over the knees and hips up to  BID (Patient taking differently: Apply 1 application topically 2 (two) times daily as needed (FOR KNEE/HIP PAIN). Apply to the skin over the knees and hips up to BID) 30 mL 6  . loratadine (CLARITIN) 10 MG tablet Take 10 mg by mouth daily.      Marland Kitchen losartan (COZAAR) 100 MG tablet TAKE 1 TABLET BY MOUTH EVERY DAY 90 tablet 3  . methocarbamol (ROBAXIN) 500 MG tablet TAKE 1 TABLET BY MOUTH EVERY 8 HOURS AS NEEDED FOR MUSCLE SPASM 40 tablet 1  . metoprolol tartrate (LOPRESSOR) 25 MG tablet Take 1 tablet (25 mg total) by mouth 2 (two) times daily. Please make overdue appt with Dr. Elease Hashimoto before anymore refills. 1st attempt 60 tablet 0  . Multiple Vitamin (MULTIVITAMIN) tablet Take 1 tablet by mouth daily.      .  sertraline (ZOLOFT) 100 MG tablet TAKE 1 & 1/2 TABLET BY MOUTH DAILY 45 tablet 1  . valACYclovir (VALTREX) 500 MG tablet TAKE 1 TABLET BY MOUTH DAILY**CAN INCREASE TO 2 TABS FOR 5 DAY IN THE EVENT OF RECURRENCE** 180 tablet 1   No current facility-administered medications for this visit.    Allergies  Allergen Reactions  . Sulfamethoxazole Swelling and Other (See Comments)    SWELLING REACTION UNSPECIFIED     Family History  Adopted: Yes  Problem Relation Age of Onset  . Osteoporosis Mother   . Heart disease Mother   . Hypertension Mother   . Hyperlipidemia Mother   . Stroke Mother   . Arthritis Mother   . Diabetes Father   . Heart attack Father   . Cancer Sister        breast  . Colon cancer Neg Hx     Social History   Socioeconomic History  . Marital status: Divorced    Spouse name: Not on file  . Number of children: 1  . Years of education: 67  . Highest education level: Not on file  Occupational History  . Occupation: Retired  Tobacco Use  . Smoking status: Never Smoker  . Smokeless tobacco: Never Used  Vaping Use  . Vaping Use: Never used  Substance and Sexual Activity  . Alcohol use: Not Currently    Alcohol/week: 3.0 standard drinks    Types: 3 Cans of beer per week  . Drug use: No  . Sexual activity: Yes  Other Topics Concern  . Not on file  Social History Narrative   Lives at home w/ significant other   Right-handed   Drinks 2 cups caffeine per day   Social Determinants of Health   Financial Resource Strain:   . Difficulty of Paying Living Expenses: Not on file  Food Insecurity:   . Worried About Programme researcher, broadcasting/film/video in the Last Year: Not on file  . Ran Out of Food in the Last Year: Not on file  Transportation Needs:   . Lack of Transportation (Medical): Not on file  . Lack of Transportation (Non-Medical): Not on file  Physical Activity:   . Days of Exercise per Week: Not on file  . Minutes of Exercise per Session: Not on file  Stress:   .  Feeling of Stress : Not on file  Social Connections:   . Frequency of Communication with Friends and Family: Not on file  . Frequency of Social Gatherings with Friends and Family: Not on file  . Attends Religious Services: Not on file  . Active Member of Clubs or Organizations: Not on file  . Attends  Club or Organization Meetings: Not on file  . Marital Status: Not on file  Intimate Partner Violence:   . Fear of Current or Ex-Partner: Not on file  . Emotionally Abused: Not on file  . Physically Abused: Not on file  . Sexually Abused: Not on file    Hospitiliaztions: None  Health Maintenance:    Flu: never  Tetanus: 03/2010  Pneumovax: 04/2015  Prevnar: 07/2015  Zostavax: never  Shingrix: never  Covid: never  Mammogram: > 5 years ago  Pap Smear: 12/2011  Bone Density: never  Colon Screening: never  Eye Doctor: annually  Dental Exam: as needed   Providers:   PCP: Nicki Reaperegina Brallan Denio, NP  Orthopedist: Dr. Otelia SergeantNitka  Cardiologist: Dr.Nasher  Physical Med: Dr. Alvester MorinNewton    I have personally reviewed and have noted:  1. The patient's medical and social history 2. Their use of alcohol, tobacco or illicit drugs 3. Their current medications and supplements 4. The patient's functional ability including ADL's, fall risks, home safety risks and hearing or visual impairment. 5. Diet and physical activities 6. Evidence for depression or mood disorder  Subjective:   Review of Systems:   Constitutional: Denies fever, malaise, fatigue, headache or abrupt weight changes.  HEENT: Denies eye pain, eye redness, ear pain, ringing in the ears, wax buildup, runny nose, nasal congestion, bloody nose, or sore throat. Respiratory: Denies difficulty breathing, shortness of breath, cough or sputum production.   Cardiovascular: Denies chest pain, chest tightness, palpitations or swelling in the hands or feet.  Gastrointestinal: Denies abdominal pain, bloating, constipation, diarrhea or blood in the  stool.  GU: Pt reports urinary urgency. Denies frequency, pain with urination, burning sensation, blood in urine, odor or discharge. Musculoskeletal: Pt reports chronic back pain. Denies decrease in range of motion, difficulty with gait, or joint swelling.  Skin: Denies redness, rashes, lesions or ulcercations.  Neurological: Denies dizziness, difficulty with memory, difficulty with speech or problems with balance and coordination.  Psych: Pt has a history of anxiety and depression. Denies SI/HI.  No other specific complaints in a complete review of systems (except as listed in HPI above).  Objective:  PE:   BP 134/66   Pulse 67   Temp 97.7 F (36.5 C) (Temporal)   Ht 5' 3.33" (1.609 m)   Wt 153 lb (69.4 kg)   LMP 01/08/2012   SpO2 98%   BMI 26.82 kg/m   Wt Readings from Last 3 Encounters:  09/22/19 155 lb (70.3 kg)  08/11/19 155 lb (70.3 kg)  05/05/19 155 lb (70.3 kg)    General: Appears her stated age, well developed, well nourished in NAD. Skin: Warm, dry and intact. Lipoma noted of left forearm.  HEENT: Head: normal shape and size; Eyes: sclera white, no icterus, conjunctiva pink, PERRLA and EOMs intact;  Neck: Neck supple, trachea midline. No masses, lumps or thyromegaly present.  Cardiovascular: Normal rate and rhythm. S1,S2 noted.  Murmur noted. No JVD or BLE edema. Right carotid bruits noted. Pulmonary/Chest: Normal effort and positive vesicular breath sounds. No respiratory distress. No wheezes, rales or ronchi noted.  Abdomen: Soft and nontender. Normal bowel sounds. No distention or masses noted. Liver, spleen and kidneys non palpable. Musculoskeletal: No bony tenderness noted over the lumbar spine today. Strength 5/5 BUE. Strength 5/5 RLE, 4/5 LLE. No difficulty with gait. Neurological: Alert and oriented. Cranial nerves II-XII grossly intact. Coordination normal.  Psychiatric: Mood and affect tearful at times. Behavior is normal. Judgment and thought content  normal.  BMET    Component Value Date/Time   NA 141 01/06/2018 1109   K 4.7 01/12/2018 0931   CL 107 01/06/2018 1109   CO2 30 01/06/2018 1109   GLUCOSE 107 (H) 01/06/2018 1109   BUN 23 01/06/2018 1109   CREATININE 1.00 01/06/2018 1109   CREATININE 0.84 07/27/2015 1016   CALCIUM 10.1 01/06/2018 1109   GFRNONAA 51 (L) 05/27/2017 1951   GFRAA 59 (L) 05/27/2017 1951    Lipid Panel     Component Value Date/Time   CHOL 165 01/06/2018 1109   TRIG 100.0 01/06/2018 1109   HDL 67.20 01/06/2018 1109   CHOLHDL 2 01/06/2018 1109   VLDL 20.0 01/06/2018 1109   LDLCALC 78 01/06/2018 1109    CBC    Component Value Date/Time   WBC 8.2 01/06/2018 1109   RBC 4.12 01/06/2018 1109   HGB 13.1 01/06/2018 1109   HCT 39.3 01/06/2018 1109   PLT 265.0 01/06/2018 1109   MCV 95.2 01/06/2018 1109   MCH 30.7 05/27/2017 1951   MCHC 33.5 01/06/2018 1109   RDW 13.7 01/06/2018 1109   LYMPHSABS 1.3 05/27/2017 1951   MONOABS 1.3 (H) 05/27/2017 1951   EOSABS 0.2 05/27/2017 1951   BASOSABS 0.0 05/27/2017 1951    Hgb A1C Lab Results  Component Value Date   HGBA1C 5.8 02/21/2015      Assessment and Plan:   Medicare Annual Wellness Visit:  Diet: She does eat meat. She consumes fruits and veggies daily. She tries to avoid fried foods. She drinks mostly water. Physical activity: Walking Depression/mood screen: Negative, PHQ 9 score of 1 Hearing: Intact to whispered voice Visual acuity: Grossly normal, performs annual eye exam  ADLs: Capable Fall risk: None Home safety: Good Cognitive evaluation: Intact to orientation, naming, recall and repetition EOL planning: No adv directives, DNR/ I agree  Preventative Medicine: She declines flu, zostovax, shingrix or covid vaccines. Pneumovax and prevnar UTD. She declines mammogram, pap smear, bone density or colon cancer screening at this time. Encouraged her to consume a balanced diet and exercise regimen. Advised her to see an eye doctor and  dentist annually. Will check CBC, CMET, Lipid and Vit D today. Due dates for screening exams given to patient as part of her AVS.   Next appointment: RTC in 1 year, sooner if needed   Nicki Reaper, NP This visit occurred during the SARS-CoV-2 public health emergency.  Safety protocols were in place, including screening questions prior to the visit, additional usage of staff PPE, and extensive cleaning of exam room while observing appropriate contact time as indicated for disinfecting solutions.

## 2019-10-19 NOTE — Assessment & Plan Note (Signed)
CMET and lipid profile today Continue Atorvastatin for now Encouraged her to consume a low fat diet

## 2019-10-19 NOTE — Assessment & Plan Note (Signed)
Continue Atorvastatin, Metoprolol and Aspirin She will continue to follow with cardiology

## 2019-10-19 NOTE — Assessment & Plan Note (Signed)
She is not interested in taking medication for this at this time Will monitor

## 2019-10-19 NOTE — Assessment & Plan Note (Signed)
Continue Losartan and Metoprolol C met today Reinforced DASH diet

## 2019-10-19 NOTE — Assessment & Plan Note (Signed)
This is her biggest issue She will consider nerve ablation by Dr. Ferdie Ping She will continue Diclofenac, Gabapentin and Methocarbamol as prescribed Encouraged daily stretching

## 2019-10-19 NOTE — Assessment & Plan Note (Signed)
Continue Valacyclovir as needed 

## 2019-10-19 NOTE — Assessment & Plan Note (Signed)
Continue Sertraline and Wellbutrin Support offered

## 2019-10-19 NOTE — Patient Instructions (Signed)

## 2019-10-25 NOTE — Addendum Note (Signed)
Addended by: Littie Deeds Y on: 10/25/2019 04:00 PM   Modules accepted: Orders

## 2019-10-26 ENCOUNTER — Other Ambulatory Visit: Payer: Self-pay | Admitting: Specialist

## 2019-10-28 ENCOUNTER — Other Ambulatory Visit: Payer: Self-pay | Admitting: Internal Medicine

## 2019-10-28 ENCOUNTER — Other Ambulatory Visit: Payer: Self-pay | Admitting: Specialist

## 2019-10-28 ENCOUNTER — Other Ambulatory Visit (INDEPENDENT_AMBULATORY_CARE_PROVIDER_SITE_OTHER): Payer: Medicare Other

## 2019-10-28 ENCOUNTER — Other Ambulatory Visit: Payer: Self-pay

## 2019-10-28 DIAGNOSIS — E875 Hyperkalemia: Secondary | ICD-10-CM | POA: Diagnosis not present

## 2019-10-28 LAB — POTASSIUM: Potassium: 5.4 mEq/L — ABNORMAL HIGH (ref 3.5–5.1)

## 2019-10-29 NOTE — Telephone Encounter (Signed)
Regina please advise. Pt wants refills on:  Sertraline 45 tabs   1-refills Last filled-09/02/2019 Last OV-10/19/2019   Buspar 60 tabs   0-refills Last filled-07/17/2019 Last OV-10/19/2019  My PMP isn't working I'm so sorry to not be of help with that.

## 2019-10-30 ENCOUNTER — Other Ambulatory Visit: Payer: Self-pay | Admitting: Cardiovascular Disease

## 2019-10-31 ENCOUNTER — Other Ambulatory Visit: Payer: Self-pay | Admitting: Specialist

## 2019-11-20 ENCOUNTER — Other Ambulatory Visit: Payer: Self-pay | Admitting: Internal Medicine

## 2019-11-21 ENCOUNTER — Other Ambulatory Visit: Payer: Self-pay | Admitting: Cardiovascular Disease

## 2019-11-24 ENCOUNTER — Ambulatory Visit: Payer: Medicare Other | Admitting: Specialist

## 2019-11-24 ENCOUNTER — Encounter: Payer: Self-pay | Admitting: Specialist

## 2019-11-24 VITALS — BP 161/71 | HR 62 | Ht 63.25 in | Wt 153.0 lb

## 2019-11-24 DIAGNOSIS — M1711 Unilateral primary osteoarthritis, right knee: Secondary | ICD-10-CM

## 2019-11-24 DIAGNOSIS — Z8679 Personal history of other diseases of the circulatory system: Secondary | ICD-10-CM | POA: Diagnosis not present

## 2019-11-24 DIAGNOSIS — Z9889 Other specified postprocedural states: Secondary | ICD-10-CM | POA: Diagnosis not present

## 2019-11-24 DIAGNOSIS — I739 Peripheral vascular disease, unspecified: Secondary | ICD-10-CM | POA: Diagnosis not present

## 2019-11-24 DIAGNOSIS — M1712 Unilateral primary osteoarthritis, left knee: Secondary | ICD-10-CM

## 2019-11-24 DIAGNOSIS — M4156 Other secondary scoliosis, lumbar region: Secondary | ICD-10-CM

## 2019-11-24 DIAGNOSIS — M4316 Spondylolisthesis, lumbar region: Secondary | ICD-10-CM | POA: Diagnosis not present

## 2019-11-24 DIAGNOSIS — M4807 Spinal stenosis, lumbosacral region: Secondary | ICD-10-CM

## 2019-11-24 MED ORDER — TRAMADOL-ACETAMINOPHEN 37.5-325 MG PO TABS
1.0000 | ORAL_TABLET | ORAL | 0 refills | Status: DC | PRN
Start: 2019-11-24 — End: 2019-12-08

## 2019-11-24 NOTE — Patient Instructions (Signed)
Avoid frequent bending and stooping  No lifting greater than 10 lbs. May use ice or moist heat for pain. Weight loss is of benefit. Stop the diclofenac medication for lumbar disc disease is arthritis. Exercise is important to improve your indurance and does allow people to function better inspite of back pain. Ultracet for pain. This has both tylenol and ultram for pain relief, more than 6 total tablets of 500mg  tylenol and ultracet combined per day to avoid having concentrations that may irritate the liver.

## 2019-11-24 NOTE — Progress Notes (Signed)
Office Visit Note   Patient: Heather [name]           Date of Birth: 1945-09-16           MRN: [id number] Visit Date: 11/24/2019              Requested by: Lorre Munroe, NP [address] [place],  [place] [address] PCP: Lorre Munroe, NP   Assessment & Plan: Visit Diagnoses:  1. Status post lumbar laminectomy   2. History of ASCVD (atherosclerotic cardiovascular disease)   3. Spinal stenosis of lumbosacral region   4. Spondylolisthesis, lumbar region   5. Claudication of left lower extremity (HCC)   6. Other secondary scoliosis, lumbar region   7. Unilateral primary osteoarthritis, left knee   8. Unilateral primary osteoarthritis, right knee     Plan: Avoid frequent bending and stooping  No lifting greater than 10 lbs. May use ice or moist heat for pain. Weight loss is of benefit. Stop the diclofenac medication for lumbar disc disease is arthritis. Exercise is important to improve your indurance and does allow people to function better inspite of back pain. Ultracet for pain. This has both tylenol and ultram for pain relief, more than 6 total tablets of 500mg  tylenol and ultracet combined per day to avoid having concentrations that may irritate the liver.   Follow-Up Instructions: No follow-ups on file.   Orders:  No orders of the defined types were placed in this encounter.  No orders of the defined types were placed in this encounter.     Procedures: No procedures performed   Clinical Data: No additional findings.   Subjective: Chief Complaint  Patient presents with  . Lower Back - Follow-up    74 year old female with history of lumbar spinal stenosis and underwent decompressive laminectomy with so initial improvement but has since had recurrent pain into the back and legs. Back pain is greater than leg pain and she finds some relief with walking but walking is also Limited due to claudication symptoms. No bowel or bladder difficulty. She does go  grocery shopping with her significant other and she does not lean on the cart for support at the grocery store.    Review of Systems  Constitutional: Negative.   HENT: Negative.   Eyes: Negative.   Respiratory: Negative.   Cardiovascular: Negative.   Gastrointestinal: Negative.   Endocrine: Negative.   Genitourinary: Negative.   Musculoskeletal: Negative.   Skin: Negative.   Allergic/Immunologic: Negative.   Neurological: Negative.   Hematological: Negative.   Psychiatric/Behavioral: Negative.      Objective: Vital Signs: BP (!) 161/71 (BP Location: Left Arm, Patient Position: Sitting)   Pulse 62   Ht 5' 3.25" (1.607 m)   Wt 153 lb (69.4 kg)   LMP 01/08/2012   BMI 26.89 kg/m   Physical Exam Musculoskeletal:     Lumbar back: Negative right straight leg raise test.     Back Exam   Muscle Strength  Right Quadriceps:  5/5  Left Quadriceps:  5/5  Right Hamstrings:  5/5  Left Hamstrings:  5/5   Tests  Straight leg raise right: negative  Reflexes  Patellar: 1/4 Achilles: 1/4  Other  Toe walk: abnormal Heel walk: abnormal      Specialty Comments:  No specialty comments available.  Imaging: No results found.   PMFS History: Patient Active Problem List   Diagnosis Date Noted  . OAB (overactive bladder) 11/18/2017  . Hypertension 12/17/2016  .  Lumbar stenosis with neurogenic claudication 07/03/2016  . Carotid disease, bilateral (HCC) 03/03/2015  . Seasonal allergies 03/03/2014  . Genital herpes 03/03/2014  . Hyperlipidemia 03/03/2013  . Anxiety and depression 12/11/2011   Past Medical History:  Diagnosis Date  . Anxiety   . Arthritis    "knees, right hip, lumbar back" (12/17/2016)  . Chicken pox   . Chronic lower back pain   . DDD (degenerative disc disease), lumbar   . Depression   . Headache 06/23/2015   "2d after carotid OR & again today" (12/17/2016)  . Heart murmur    MVP  . High cholesterol   . History of blood transfusion 1975    "related to femur  fracture" (12/17/2016)  . HOH (hard of hearing)    left  . Hypertension   . MVP (mitral valve prolapse)    echo 1986-mild  . 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 Shortness of breath dyspnea    WITH EXERTION    . Spinal stenosis of lumbar region   . Stroke [facility]) 04/2015   "light stroke; sometimes my thinking is slow since" (12/17/2016)  . Wears glasses     Family History  Adopted: Yes  Problem Relation Age of Onset  . Osteoporosis Mother   . Heart disease Mother   . Hypertension Mother   . Hyperlipidemia Mother   . Stroke Mother   . Arthritis Mother   . Diabetes Father   . Heart attack Father   . Cancer Sister        breast  . Colon cancer Neg Hx     Past Surgical History:  Procedure Laterality Date  . CARPAL TUNNEL RELEASE Right 03/31/2013   Procedure: RIGHT CARPAL TUNNEL RELEASE;  Surgeon: Nicki Reaper, MD;  Location: [facility];  Service: Orthopedics;  Laterality: Right;  . ENDARTERECTOMY Right 05/03/2015   Procedure: RIGHT CAROTID ENDARTERECTOMY ;  Surgeon: Fransisco Hertz, MD;  Location: [facility] OR;  Service: Vascular;  Laterality: Right;  . FEMUR FRACTURE SURGERY Right 1975   "hip"  . FRACTURE SURGERY    . LUMBAR LAMINECTOMY N/A 05/26/2017   Procedure: BILATERAL PARTIAL HEMILAMINECTOMIES L3-4 AND L4-5;  Surgeon: Kerrin Champagne, MD;  Location: [facility] OR;  Service: Orthopedics;  Laterality: N/A;  . NASAL SINUS SURGERY  1975  . PLANTAR FASCIA RELEASE Bilateral 1968  . SHOULDER OPEN ROTATOR CUFF REPAIR Right 1998  . TONSILLECTOMY  1951  . TUBAL LIGATION  1995   Social History   Occupational History  . Occupation: Retired  Tobacco Use  . Smoking status: Never Smoker  . Smokeless tobacco: Never Used  Vaping Use  . Vaping Use: Never used  Substance and Sexual Activity  . Alcohol use: Not Currently    Alcohol/week: 3.0 standard drinks    Types: 3 Cans of  beer per week  . Drug use: No  . Sexual activity: Yes

## 2019-11-27 ENCOUNTER — Other Ambulatory Visit: Payer: Self-pay | Admitting: Cardiovascular Disease

## 2019-12-08 ENCOUNTER — Other Ambulatory Visit: Payer: Self-pay | Admitting: Specialist

## 2019-12-08 ENCOUNTER — Other Ambulatory Visit: Payer: Self-pay | Admitting: Cardiovascular Disease

## 2019-12-09 ENCOUNTER — Telehealth: Payer: Self-pay | Admitting: Specialist

## 2019-12-09 NOTE — Telephone Encounter (Signed)
Patient is requesting a call back from [place]. Patient did not state the nature of her call. Patient phone number is [phone].

## 2019-12-10 ENCOUNTER — Other Ambulatory Visit: Payer: Self-pay | Admitting: Cardiovascular Disease

## 2019-12-10 NOTE — Telephone Encounter (Signed)
Patient was calling about getting a refill on her pain medication but it has already been filled.

## 2019-12-17 ENCOUNTER — Other Ambulatory Visit: Payer: Self-pay | Admitting: Internal Medicine

## 2019-12-17 ENCOUNTER — Other Ambulatory Visit: Payer: Self-pay | Admitting: Cardiovascular Disease

## 2019-12-24 ENCOUNTER — Other Ambulatory Visit: Payer: Self-pay | Admitting: Specialist

## 2020-01-18 ENCOUNTER — Other Ambulatory Visit: Payer: Self-pay | Admitting: Specialist

## 2020-02-02 ENCOUNTER — Other Ambulatory Visit: Payer: Self-pay | Admitting: Specialist

## 2020-02-02 ENCOUNTER — Other Ambulatory Visit: Payer: Self-pay | Admitting: Cardiovascular Disease

## 2020-02-04 ENCOUNTER — Other Ambulatory Visit: Payer: Self-pay | Admitting: Specialist

## 2020-02-16 ENCOUNTER — Other Ambulatory Visit: Payer: Self-pay | Admitting: Internal Medicine

## 2020-02-16 ENCOUNTER — Other Ambulatory Visit: Payer: Self-pay | Admitting: Specialist

## 2020-02-16 NOTE — Telephone Encounter (Signed)
Pls advise.  

## 2020-02-24 ENCOUNTER — Ambulatory Visit: Payer: Self-pay

## 2020-02-24 ENCOUNTER — Ambulatory Visit: Payer: Medicare Other | Admitting: Specialist

## 2020-02-24 ENCOUNTER — Other Ambulatory Visit: Payer: Self-pay

## 2020-02-24 ENCOUNTER — Encounter: Payer: Self-pay | Admitting: Specialist

## 2020-02-24 VITALS — BP 158/64 | HR 56 | Ht 63.25 in | Wt 153.0 lb

## 2020-02-24 DIAGNOSIS — M4807 Spinal stenosis, lumbosacral region: Secondary | ICD-10-CM

## 2020-02-24 DIAGNOSIS — Z9889 Other specified postprocedural states: Secondary | ICD-10-CM

## 2020-02-24 DIAGNOSIS — M47816 Spondylosis without myelopathy or radiculopathy, lumbar region: Secondary | ICD-10-CM

## 2020-02-24 DIAGNOSIS — M4156 Other secondary scoliosis, lumbar region: Secondary | ICD-10-CM

## 2020-02-24 DIAGNOSIS — M1712 Unilateral primary osteoarthritis, left knee: Secondary | ICD-10-CM | POA: Diagnosis not present

## 2020-02-24 DIAGNOSIS — M4316 Spondylolisthesis, lumbar region: Secondary | ICD-10-CM

## 2020-02-24 DIAGNOSIS — M1711 Unilateral primary osteoarthritis, right knee: Secondary | ICD-10-CM | POA: Diagnosis not present

## 2020-02-24 DIAGNOSIS — M48062 Spinal stenosis, lumbar region with neurogenic claudication: Secondary | ICD-10-CM

## 2020-02-24 DIAGNOSIS — M51369 Other intervertebral disc degeneration, lumbar region without mention of lumbar back pain or lower extremity pain: Secondary | ICD-10-CM

## 2020-02-24 DIAGNOSIS — M5136 Other intervertebral disc degeneration, lumbar region: Secondary | ICD-10-CM

## 2020-02-24 NOTE — Progress Notes (Signed)
Office Visit Note   Patient: Heather [name]           Date of Birth: 02/21/1945           MRN: [id number] Visit Date: 02/24/2020              Requested by: Lorre Munroe, NP [address] [place],  [place] [address] PCP: Lorre Munroe, NP   Assessment & Plan: Visit Diagnoses:  1. Status post lumbar laminectomy   2. Spinal stenosis of lumbosacral region   3. Other secondary scoliosis, lumbar region   4. Spondylolisthesis, lumbar region   5. Unilateral primary osteoarthritis, right knee   6. Spinal stenosis of lumbar region with neurogenic claudication   7. Unilateral primary osteoarthritis, left knee   8. Spondylosis without myelopathy or radiculopathy, lumbar region   9. Degenerative disc disease, lumbar     Plan: Avoid frequent bending and stooping  No lifting greater than 10 lbs. May use ice or moist heat for pain. Weight loss is of benefit. Stop the diclofenac medication for lumbar disc disease is arthritis. Exercise is important to improve your indurance and does allow people to function better inspite of back pain. Ultracet for pain. This has both tylenol and ultram for pain relief, more than 6 total tablets of 500mg  tylenol and ultracet combined per day to avoid having concentrations that may irritate the liver.  Recommend increase the tramadol to 50 mg tablets one every 6 hours prn pain.  Follow-Up Instructions: No follow-ups on file.   Orders:  Orders Placed This Encounter  Procedures  . XR Lumbar Spine 2-3 Views   No orders of the defined types were placed in this encounter.     Procedures: No procedures performed   Clinical Data: No additional findings.   Subjective: Chief Complaint  Patient presents with  . Lower Back - Follow-up  . Right Knee - Pain    75 year old female with history of lumbar decompressive laminectomy for spinal stenosis about 2 year ago. She has significant ASPVD. 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  She has been experiencing pain into the back and radiation into the lumbar spine the small of her back. Does have urinary urgency. 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 Most of the pain is in the back mid lumbar spine downwards to the L-S junction And into the small of her back.    Review of Systems  Constitutional: Negative.   HENT: Negative.   Eyes: Negative.   Respiratory: Negative.   Cardiovascular: Negative.   Gastrointestinal: Negative.   Endocrine: Negative.   Genitourinary: Negative.   Musculoskeletal: Negative.   Skin: Negative.   Allergic/Immunologic: Negative.   Neurological: Negative.   Hematological: Negative.   Psychiatric/Behavioral: Negative.      Objective: Vital Signs: BP (!) 158/64 (BP Location: Left Arm, Patient Position: Sitting)   Pulse (!) 56   Ht 5' 3.25" (1.607 m)   Wt 153 lb (69.4 kg)   LMP 01/08/2012   BMI 26.89 kg/m   Physical Exam Constitutional:      Appearance: She is well-developed and well-nourished.  HENT:     Head: Normocephalic and atraumatic.  Eyes:     Extraocular Movements: EOM normal.     Pupils: Pupils are equal, round, and reactive to light.  Pulmonary:  Effort: Pulmonary effort is normal.     Breath sounds: Normal breath sounds.  Abdominal:     General: Bowel sounds are normal.     Palpations: Abdomen is soft.  Musculoskeletal:     Cervical back: Normal range of motion and neck supple.     Lumbar back: Negative right straight leg raise test and negative left straight leg raise test.  Skin:    General: Skin is warm and dry.  Neurological:     Mental Status: She is [name] and oriented to person, place, and time.  Psychiatric:        Mood and Affect: Mood and affect normal.        Behavior: Behavior normal.        Thought Content: Thought content normal.        Judgment: Judgment normal.      Back Exam   Tenderness  The patient is experiencing tenderness in the lumbar.  Range of Motion  Extension: abnormal  Flexion: abnormal  Lateral bend right: abnormal  Lateral bend left: abnormal  Rotation left: abnormal   Muscle Strength  Right Quadriceps:  5/5  Right Hamstrings:  5/5  Left Hamstrings:  5/5   Tests  Straight leg raise right: negative Straight leg raise left: negative  Reflexes  Patellar: 2/4 Achilles: 2/4  Other  Toe walk: normal Heel walk: normal Sensation: normal Erythema: no back redness Scars: absent      Specialty Comments:  No specialty comments available.  Imaging: No results found.   PMFS History: Patient Active Problem List   Diagnosis Date Noted  . OAB (overactive bladder) 11/18/2017  . Hypertension 12/17/2016  . Lumbar stenosis with neurogenic claudication 07/03/2016  . Carotid disease, bilateral (HCC) 03/03/2015  . Seasonal allergies 03/03/2014  . Genital herpes 03/03/2014  . Hyperlipidemia 03/03/2013  . Anxiety and depression 12/11/2011   Past Medical History:  Diagnosis Date  . Anxiety   . Arthritis    "knees, right hip, lumbar back" (12/17/2016)  . Chicken pox   . Chronic lower back pain   . DDD (degenerative disc disease), lumbar   . Depression   . Headache 06/23/2015   "2d after carotid OR & again today" (12/17/2016)  . Heart murmur    MVP  . High cholesterol   . History of blood transfusion 1975   "related to femur  fracture" (12/17/2016)  . HOH (hard of hearing)    left  . Hypertension   . MVP (mitral valve prolapse)    echo 1986-mild  . 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 Shortness of breath dyspnea    WITH EXERTION    . Spinal stenosis of lumbar region   . Stroke [facility]) 04/2015   "light stroke; sometimes my thinking is slow since" (12/17/2016)  . Wears glasses     Family History  Adopted: Yes   Problem Relation Age of Onset  . Osteoporosis Mother   . Heart disease Mother   . Hypertension Mother   . Hyperlipidemia Mother   . Stroke Mother   . Arthritis Mother   . Diabetes Father   . Heart attack Father   . Cancer Sister        breast  . Colon cancer Neg Hx     Past Surgical History:  Procedure Laterality Date  . CARPAL TUNNEL RELEASE Right 03/31/2013   Procedure: RIGHT CARPAL TUNNEL RELEASE;  Surgeon: Nicki Reaper,  MD;  Location: [facility];  Service: Orthopedics;  Laterality: Right;  . ENDARTERECTOMY Right 05/03/2015   Procedure: RIGHT CAROTID ENDARTERECTOMY ;  Surgeon: Fransisco Hertz, MD;  Location: [facility] OR;  Service: Vascular;  Laterality: Right;  . FEMUR FRACTURE SURGERY Right 1975   "hip"  . FRACTURE SURGERY    . LUMBAR LAMINECTOMY N/A 05/26/2017   Procedure: BILATERAL PARTIAL HEMILAMINECTOMIES L3-4 AND L4-5;  Surgeon: Kerrin Champagne, MD;  Location: [facility] OR;  Service: Orthopedics;  Laterality: N/A;  . NASAL SINUS SURGERY  1975  . PLANTAR FASCIA RELEASE Bilateral 1968  . SHOULDER OPEN ROTATOR CUFF REPAIR Right 1998  . TONSILLECTOMY  1951  . TUBAL LIGATION  1995   Social History   Occupational History  . Occupation: Retired  Tobacco Use  . Smoking status: Never Smoker  . Smokeless tobacco: Never Used  Vaping Use  . Vaping Use: Never used  Substance and Sexual Activity  . Alcohol use: Not Currently    Alcohol/week: 3.0 standard drinks    Types: 3 Cans of beer per week  . Drug use: No  . Sexual activity: Yes

## 2020-02-24 NOTE — Patient Instructions (Addendum)
Plan: Avoid frequent bending and stooping  No lifting greater than 10 lbs. May use ice or moist heat for pain. Weight loss is of benefit. Stop the diclofenac medication for lumbar disc disease is arthritis. Exercise is important to improve your indurance and does allow people to function better inspite of back pain. Ultracet for pain. This has both tylenol and ultram for pain relief, more than 6 total tablets of 500mg  tylenol and ultracet combined per day to avoid having concentrations that may irritate the liver.  Recommend increase the tramadol to 50 mg tablets one or two every 6 hours prn pain.

## 2020-02-26 ENCOUNTER — Other Ambulatory Visit: Payer: Self-pay | Admitting: Cardiovascular Disease

## 2020-02-29 ENCOUNTER — Other Ambulatory Visit: Payer: Self-pay | Admitting: Specialist

## 2020-03-14 ENCOUNTER — Other Ambulatory Visit: Payer: Self-pay | Admitting: Specialist

## 2020-03-25 ENCOUNTER — Other Ambulatory Visit: Payer: Self-pay | Admitting: Cardiovascular Disease

## 2020-03-29 ENCOUNTER — Other Ambulatory Visit: Payer: Self-pay | Admitting: Specialist

## 2020-04-12 ENCOUNTER — Other Ambulatory Visit: Payer: Self-pay | Admitting: Specialist

## 2020-04-20 ENCOUNTER — Encounter: Payer: Self-pay | Admitting: Cardiovascular Disease

## 2020-04-20 NOTE — Progress Notes (Signed)
Cardiology Office Note   Date:  04/21/2020   ID:  Heather [name], [name] Sep 21, 1945, MRN [id number]  PCP:  Lorre Munroe, NP  Cardiologist:   Kristeen Miss, MD   Chief Complaint  Patient presents with   Hyperlipidemia     Problem List  1. Essential HTN 2 Carotid artery disease 3. Hyperlipidemia     Feb. 8, 2017 Heather [name] is a 75 y.o. female who presents for pre op evaluation prior to having carotid artery endarterectomy She has a history of mitral valve prolapse and has previously seen Dr. Aleen Campi. She's not seen a cardiologist for years. She has had some tachypalpitations in the past.  She's had some hyperlipidemia. She also has hypertension. She does not take her BP at home but thinks todays elevated BP is more due to white coat HTN  She has had some exertional chest tightness -  Last for several minutes ( or until she stops walking )  Has been going on for several months   Eats lots of processed foods and fast foods.     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  She has a history of hyperlipidemia. We started atorvastatin 40 mg a day. Her last lipid levels look very good.  Dec. 7, 2017:  Heather [name] is doing ok. Having back pain .   Was supposed to have back surgery earlier this year - had urgent carotid surgery instead ( and had a CVA as a complication of that )  Has some dyspnea.   Thinks its allergies.   February 05, 2016: Heather [name] is seen today for follow-up of a recent emergency room visit.  I saw [name] in 2017 for preoperative carotid surgery.  She presented to the emergency room in November, 2018 with increasing shortness of breath and some chest discomfort. Is better from a respiratory / cardiology standpoint .   Needs to have back surgery.   Waiting on clearance from Dr. Imogene Burn    She has a history of bilateral carotid artery disease.  She is status post right carotid endarterectomy in April, 2017.  Echo in Nov. 2018 showed normal LV function   April 21, 2020: Heather [name] is seen today for follow  Up for carotid artery disease. S/p right CEA in April 2017. She needs to have knee surgery .  BP is elevated today . Has not been checking her BP at home ,   Advised her to get a BP  Is not avoiding salt .  No CP ,  Is not exercising much because of knee pain   Echo in 2018 showed normal LV function   Past Medical History:  Diagnosis Date   Anxiety    Arthritis    "knees, right hip, lumbar back" (12/17/2016)   Chicken pox    Chronic lower back pain    DDD (degenerative disc disease), lumbar    Depression    Headache 06/23/2015   "2d after carotid OR & again today" (12/17/2016)   Heart murmur    MVP   High cholesterol    History of blood transfusion 1975   "related to femur  fracture" (12/17/2016)   HOH (hard of hearing)    left   Hypertension    MVP (mitral valve prolapse)  echo 1986-mild   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   Orogenital transmission (husband had cold sore; HSV1 culture positive)   Shortness of breath dyspnea    WITH EXERTION     Spinal stenosis of lumbar region    Stroke [facility]) 04/2015   "light stroke; sometimes my thinking is slow since" (12/17/2016)   Wears glasses     Past Surgical History:  Procedure Laterality Date   CARPAL TUNNEL RELEASE Right 03/31/2013   Procedure: RIGHT CARPAL TUNNEL RELEASE;  Surgeon: Nicki Reaper, MD;  Location:  SUR[facility]ERY CENTER;  Service: Orthopedics;  Laterality: Right;   ENDARTERECTOMY Right 05/03/2015   Procedure: RIGHT CAROTID ENDARTERECTOMY ;  Surgeon: Fransisco Hertz, MD;  Location[facility];  Service: Vascular;  Laterality: Right;   FEMUR FRACTURE SURGERY Right 1975   "hip"   FRACTURE SURGERY     LUMBAR LAMINECTOMY N/A 05/26/2017   Procedure: BILATERAL  PARTIAL HEMILAMINECTOMIES L3-4 AND L4-5;  Surgeon: Kerrin Champagne, MD;  Location: [facility] OR;  Service: Orthopedics;  Laterality: N/A;   NASAL SINUS SURGERY  1975   PLANTAR FASCIA RELEASE Bilateral 1968   SHOULDER OPEN ROTATOR CUFF REPAIR Right 1998   TONSILLECTOMY  1951   TUBAL LIGATION  1995     Current Outpatient Medications  Medication Sig Dispense Refill   Ascorbic Acid (VITAMIN C) 1000 MG tablet Take 1,000 mg by mouth daily.     aspirin EC 81 MG tablet Take 1 tablet (81 mg total) by mouth daily.     busPIRone (BUSPAR) 7.5 MG tablet Take 1 tablet (7.5 mg total) by mouth 2 (two) times daily. 180 tablet 2   Cholecalciferol (VITAMIN D3) 1000 UNITS CAPS Take 1,000 Units by mouth daily.      diazepam (VALIUM) 5 MG tablet Take 1 by mouth 1 hour  pre-procedure with very light food. May bring 2nd tablet to appointment. 2 tablet 0   docusate sodium (COLACE) 100 MG capsule TAKE 1 CAPSULE BY MOUTH TWICE A DAY 40 capsule 0   gabapentin (NEURONTIN) 300 MG capsule TAKE 1 CAPSULE BY MOUTH TWICE A DAY 180 capsule 4   lidocaine (XYLOCAINE) 2 % jelly Apply 1 application topically as needed. Apply to the skin over the knees and hips up to BID 30 mL 6   loratadine (CLARITIN) 10 MG tablet Take 10 mg by mouth daily.     losartan (COZAAR) 100 MG tablet TAKE 1 TABLET BY MOUTH EVERY DAY 90 tablet 3   methocarbamol (ROBAXIN) 500 MG tablet TAKE 1 TABLET BY MOUTH EVERY 8 HOURS AS NEEDED FOR MUSCLE SPASM 40 tablet 1   Multiple Vitamin (MULTIVITAMIN) tablet Take 1 tablet by mouth daily.     sertraline (ZOLOFT) 100 MG tablet TAKE 1 AND 1/2 TABLETS BY MOUTH DAILY 135 tablet 1   traMADol-acetaminophen (ULTRACET) 37.5-325 MG tablet TAKE 1 TABLET BY MOUTH EVERY 4 HOURS AS NEEDED FOR UP TO 7 DAYS. 40 tablet 0   valACYclovir (VALTREX) 500 MG tablet TAKE 1 TABLET BY MOUTH DAILY**CAN INCREASE TO 2 TABS FOR 5 DAY IN THE EVENT OF RECURRENCE** 180 tablet 1   atorvastatin (LIPITOR) 40 MG tablet Take 1 tablet (40  mg total) by mouth daily at 6 PM. 90 tablet 3   metoprolol tartrate (LOPRESSOR) 25 MG tablet Take 1 tablet (25 mg total) by mouth 2 (two) times daily. 180 tablet 3   No current facility-administered medications for this visit.    Allergies:   Sulfamethoxazole    Social  History:  The patient  reports that she has never smoked. She has never used smokeless tobacco. She reports previous alcohol use of about 3.0 standard drinks of alcohol per week. She reports that she does not use drugs.   Family History:  The patient's family history includes Arthritis in her mother; Cancer in her sister; Diabetes in her father; Heart attack in her father; Heart disease in her mother; Hyperlipidemia in her mother; Hypertension in her mother; Osteoporosis in her mother; Stroke in her mother. She was adopted.    ROS: Noted in current history.  Otherwise all systems are negative   Physical Exam: Blood pressure 125/72, pulse (!) 52, height 5\' 3"  (1.6 m), weight 151 lb (68.5 kg), last menstrual period 01/08/2012, SpO2 97 %.  GEN:  Well nourished, well developed in no acute distress HEENT: Normal NECK: No JVD; No carotid bruits LYMPHATICS: No lymphadenopathy CARDIAC: RRR , no significant murmur  RESPIRATORY:  Clear to auscultation without rales, wheezing or rhonchi  ABDOMEN: Soft, non-tender, non-distended MUSCULOSKELETAL:  No edema; No deformity  SKIN: Warm and dry NEUROLOGIC:  Alert and oriented x 3   EKG:   April 21, 2020: Sinus bradycardia 52.  T wave inversions in lead aVL.  Recent Labs: 10/19/2019: ALT 19; BUN 26; Creatinine, Ser 0.92; Hemoglobin 12.0; Platelets 263.0; Sodium 140 10/28/2019: Potassium 5.4 No hemolysis seen    Lipid Panel    Component Value Date/Time   CHOL 181 10/19/2019 1055   TRIG 119.0 10/19/2019 1055   HDL 68.40 10/19/2019 1055   CHOLHDL 3 10/19/2019 1055   VLDL 23.8 10/19/2019 1055   LDLCALC 88 10/19/2019 1055   LDLDIRECT 132.0 02/21/2015 1331      Wt Readings  from Last 3 Encounters:  04/21/20 151 lb (68.5 kg)  02/24/20 153 lb (69.4 kg)  11/24/19 153 lb (69.4 kg)      Other studies Reviewed: Additional studies/ records that were reviewed today include: . Review of the above records demonstrates:    ASSESSMENT AND PLAN:  1.  Carotid artery disease -  Needs to follow up with [facility].     2.Essential hypertension -    BP is mildly elevated. But came down after a fe   3. Hyperlipidemia -    Lipids have been stable .   Check lipids, ALT, BMP today    4.  Knee pain :   She has aortic sclerosis.  She is at low risk for her upcoming knee surgery   5.  PAD:  She is s/p R CEA.   Has seen [facility] in the past but has missed some appointments.   I've encouraged her to call [facility] back and re-establish with a new doctor there.   She previously seen by Dr. 11/26/19 .      Current medicines are reviewed at length with the patient today.  The patient does not have concerns regarding medicines.  The following changes have been made:  no change  Labs/ tests ordered today include:   Orders Placed This Encounter  Procedures   ALT   Basic metabolic panel   Lipid panel   EKG 12-Lead     Disposition:    Follow up in 1 year .   Imogene Burn, MD  04/21/2020 10:09 AM    [facility] [address] [place], [place], [place]  [place] Phone: [phone]; Fax: [phone]

## 2020-04-21 ENCOUNTER — Other Ambulatory Visit: Payer: Self-pay

## 2020-04-21 ENCOUNTER — Ambulatory Visit: Payer: Medicare Other | Admitting: Cardiovascular Disease

## 2020-04-21 ENCOUNTER — Encounter: Payer: Self-pay | Admitting: Cardiovascular Disease

## 2020-04-21 VITALS — BP 125/72 | HR 52 | Ht 63.0 in | Wt 151.0 lb

## 2020-04-21 DIAGNOSIS — E782 Mixed hyperlipidemia: Secondary | ICD-10-CM | POA: Diagnosis not present

## 2020-04-21 LAB — ALT: ALT: 18 IU/L (ref 0–32)

## 2020-04-21 LAB — LIPID PANEL
Chol/HDL Ratio: 2.2 ratio (ref 0.0–4.4)
Cholesterol, Total: 170 mg/dL (ref 100–199)
HDL: 76 mg/dL (ref >39)
LDL Chol Calc (NIH): 74 mg/dL (ref 0–99)
Triglycerides: 113 mg/dL (ref 0–149)
VLDL Cholesterol Cal: 20 mg/dL (ref 5–40)

## 2020-04-21 LAB — BASIC METABOLIC PANEL
BUN/Creatinine Ratio: 17 (ref 12–28)
BUN: 15 mg/dL (ref 8–27)
CO2: 25 mmol/L (ref 20–29)
Calcium: 10.2 mg/dL (ref 8.7–10.3)
Chloride: 101 mmol/L (ref 96–106)
Creatinine, Ser: 0.89 mg/dL (ref 0.57–1.00)
Glucose: 96 mg/dL (ref 65–99)
Potassium: 5 mmol/L (ref 3.5–5.2)
Sodium: 141 mmol/L (ref 134–144)
eGFR: 68 mL/min/{1.73_m2} (ref >59)

## 2020-04-21 MED ORDER — METOPROLOL TARTRATE 25 MG PO TABS: 25.0000 mg | ORAL_TABLET | Freq: Two times a day (BID) | ORAL | 3 refills | Status: DC

## 2020-04-21 MED ORDER — ATORVASTATIN CALCIUM 40 MG PO TABS: 40.0000 mg | ORAL_TABLET | Freq: Every day | ORAL | 3 refills | Status: DC

## 2020-04-21 NOTE — Patient Instructions (Addendum)
Medication Instructions:  Your physician recommends that you continue on your current medications as directed. Please refer to the Current Medication list given to you today.  *If you need a refill on your cardiac medications before your next appointment, please call your pharmacy*   Lab Work: Today: Lipids, alt, BMET If you have labs (blood work) drawn today and your tests are completely normal, you will receive your results only by: Marland Kitchen MyChart Message (if you have MyChart) OR . A paper copy in the mail If you have any lab test that is abnormal or we need to change your treatment, we will call you to review the results.   Testing/Procedures: none   Follow-Up: At [facility], you and your health needs are our priority.  As part of our continuing mission to provide you with exceptional heart care, we have created designated Provider Care Teams.  These Care Teams include your primary Cardiologist (physician) and Advanced Practice Providers (APPs -  Physician Assistants and Nurse Practitioners) who all work together to provide you with the care you need, when you need it.  We recommend signing up for the patient portal called "MyChart".  Sign up information is provided on this After Visit Summary.  MyChart is used to connect with patients for Virtual Visits (Telemedicine).  Patients are able to view lab/test results, encounter notes, upcoming appointments, etc.  Non-urgent messages can be sent to your provider as well.   To learn more about what you can do with MyChart, go to [email or web address].    Your next appointment:   1 year(s)  The format for your next appointment:   In Person  Provider:   You will see one of the following Advanced Practice Providers on your designated Care Team:    Tereso Newcomer, PA-C  Vin [place]alt, [place]

## 2020-04-25 ENCOUNTER — Other Ambulatory Visit: Payer: Self-pay | Admitting: Specialist

## 2020-04-25 ENCOUNTER — Other Ambulatory Visit: Payer: Self-pay | Admitting: Internal Medicine

## 2020-05-02 ENCOUNTER — Other Ambulatory Visit: Payer: Self-pay | Admitting: Specialist

## 2020-05-09 ENCOUNTER — Encounter: Payer: Self-pay | Admitting: Internal Medicine

## 2020-05-09 ENCOUNTER — Ambulatory Visit (INDEPENDENT_AMBULATORY_CARE_PROVIDER_SITE_OTHER): Payer: Medicare Other | Admitting: Internal Medicine

## 2020-05-09 ENCOUNTER — Other Ambulatory Visit: Payer: Self-pay | Admitting: Specialist

## 2020-05-09 ENCOUNTER — Other Ambulatory Visit: Payer: Self-pay

## 2020-05-09 VITALS — BP 124/68 | HR 77 | Temp 98.0°F | Wt 149.0 lb

## 2020-05-09 DIAGNOSIS — L299 Pruritus, unspecified: Secondary | ICD-10-CM

## 2020-05-09 DIAGNOSIS — L853 Xerosis cutis: Secondary | ICD-10-CM

## 2020-05-09 DIAGNOSIS — A6004 Herpesviral vulvovaginitis: Secondary | ICD-10-CM | POA: Diagnosis not present

## 2020-05-09 MED ORDER — VALACYCLOVIR HCL 500 MG PO TABS: 500.0000 mg | ORAL_TABLET | Freq: Every day | ORAL | 1 refills | Status: DC

## 2020-05-09 NOTE — Patient Instructions (Signed)
Pruritus Pruritus is an itchy feeling on the skin. One of the most common causes is dry skin, but many different things can cause itching. Most cases of itching do not require medical attention. Sometimes itchy skin can turn into a rash. Follow these instructions at home: Skin care  Apply moisturizing lotion to your skin as needed. Lotion that contains petroleum jelly is best.  Take medicines or apply medicated creams only as told by your health care provider. This may include: ? Corticosteroid cream. ? Anti-itch lotions. ? Oral antihistamines.  Apply a cool, wet cloth (cool compress) to the affected areas.  Take baths with one of the following: ? Epsom salts. You can get these at your local pharmacy or grocery store. Follow the instructions on the packaging. ? Baking soda. Pour a small amount into the bath as told by your health care provider. ? Colloidal oatmeal. You can get this at your local pharmacy or grocery store. Follow the instructions on the packaging.  Apply baking soda paste to your skin. To make the paste, stir water into a small amount of baking soda until it reaches a paste-like consistency.  Do not scratch your skin.  Do not take hot showers or baths, which can make itching worse. A cool shower may help with itching as long as you apply moisturizing lotion after the shower.  Do not use scented soaps, detergents, perfumes, and cosmetic products. Instead, use gentle, unscented versions of these items.   General instructions  Avoid wearing tight clothes.  Keep a journal to help find out what is causing your itching. Write down: ? What you eat and drink. ? What cosmetic products you use. ? What soaps or detergents you use. ? What you wear, including jewelry.  Use a humidifier. This keeps the air moist, which helps to prevent dry skin.  Be aware of any changes in your itchiness. Contact a health care provider if:  The itching does not go away after several  days.  You are unusually thirsty or urinating more than normal.  Your skin tingles or feels numb.  Your skin or the white parts of your eyes turn yellow (jaundice).  You feel weak.  You have any of the following: ? Night sweats. ? Tiredness (fatigue). ? Weight loss. ? Abdominal pain. Summary  Pruritus is an itchy feeling on the skin. One of the most common causes is dry skin, but many different conditions and factors can cause itching.  Apply moisturizing lotion to your skin as needed. Lotion that contains petroleum jelly is best.  Take medicines or apply medicated creams only as told by your health care provider.  Do not take hot showers or baths. Do not use scented soaps, detergents, perfumes, or cosmetic products. This information is not intended to replace advice given to you by your health care provider. Make sure you discuss any questions you have with your health care provider. Document Revised: 01/28/2017 Document Reviewed: 01/28/2017 Elsevier Patient Education  2021 [employer]  

## 2020-05-09 NOTE — Progress Notes (Signed)
Subjective:    Patient ID: Heather [name], female    DOB: May 01, 1945, 75 y.o.   MRN: [id number]  HPI  Pt presents to the clinic today with c/o skin irritation. She reports over the last year, she has intermittent felt small bumps under her skin. She reports these bumps can occasionally come up to the surface, look like a pimple and drain clear fluid. Other times, she sees nothing at the skin surface. She reports intense itching of the upper and lower extremities to the point she scratches it until it bleeds. She has tried Hydrocortisone cream OTC with some relief of symptoms.  She also would like a refill of Valacyclovir. She has a history of genital herpes, but has not had a recent outbreak. She is still sexually active.   Review of Systems      Past Medical History:  Diagnosis Date  . Anxiety   . Arthritis    "knees, right hip, lumbar back" (12/17/2016)  . Chicken pox   . Chronic lower back pain   . DDD (degenerative disc disease), lumbar   . Depression   . Headache 06/23/2015   "2d after carotid OR & again today" (12/17/2016)  . Heart murmur    MVP  . High cholesterol   . History of blood transfusion 1975   "related to femur  fracture" (12/17/2016)  . HOH (hard of hearing)    left  . Hypertension   . MVP (mitral valve prolapse)    echo 1986-mild  . 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 Shortness of breath dyspnea    WITH EXERTION    . Spinal stenosis of lumbar region   . Stroke [facility]) 04/2015   "light stroke; sometimes my thinking is slow since" (12/17/2016)  . Wears glasses     Current Outpatient Medications  Medication Sig Dispense Refill  . Ascorbic Acid (VITAMIN C) 1000 MG tablet Take 1,000 mg by mouth daily.    Marland Kitchen aspirin EC 81 MG tablet Take 1 tablet (81 mg total) by mouth daily.    Marland Kitchen atorvastatin (LIPITOR) 40 MG tablet Take 1 tablet (40 mg total) by mouth daily at 6 PM.  90 tablet 3  . busPIRone (BUSPAR) 7.5 MG tablet Take 1 tablet (7.5 mg total) by mouth 2 (two) times daily. 180 tablet 2  . Cholecalciferol (VITAMIN D3) 1000 UNITS CAPS Take 1,000 Units by mouth daily.     . diazepam (VALIUM) 5 MG tablet Take 1 by mouth 1 hour  pre-procedure with very light food. May bring 2nd tablet to appointment. 2 tablet 0  . docusate sodium (COLACE) 100 MG capsule TAKE 1 CAPSULE BY MOUTH TWICE A DAY 40 capsule 0  . gabapentin (NEURONTIN) 300 MG capsule TAKE 1 CAPSULE BY MOUTH TWICE A DAY 180 capsule 4  . lidocaine (XYLOCAINE) 2 % jelly Apply 1 application topically as needed. Apply to the skin over the knees and hips up to BID 30 mL 6  . loratadine (CLARITIN) 10 MG tablet Take 10 mg by mouth daily.    Marland Kitchen losartan (COZAAR) 100 MG tablet TAKE 1 TABLET BY MOUTH EVERY DAY 90 tablet 3  . methocarbamol (ROBAXIN) 500 MG tablet TAKE 1 TABLET BY MOUTH EVERY 8 HOURS AS NEEDED FOR MUSCLE SPASMS 40 tablet 1  . metoprolol tartrate (LOPRESSOR) 25 MG tablet Take 1 tablet (25 mg total) by mouth 2 (two) times daily. 180 tablet 3  .  Multiple Vitamin (MULTIVITAMIN) tablet Take 1 tablet by mouth daily.    . sertraline (ZOLOFT) 100 MG tablet TAKE 1 AND 1/2 TABLETS DAILY BY MOUTH 135 tablet 0  . traMADol-acetaminophen (ULTRACET) 37.5-325 MG tablet TAKE 1 TABLET BY MOUTH EVERY 4 HOURS AS NEEDED FOR UP TO 7 DAYS. 40 tablet 0  . valACYclovir (VALTREX) 500 MG tablet TAKE 1 TABLET BY MOUTH DAILY**CAN INCREASE TO 2 TABS FOR 5 DAY IN THE EVENT OF RECURRENCE** 180 tablet 1   No current facility-administered medications for this visit.    Allergies  Allergen Reactions  . Sulfamethoxazole Swelling and Other (See Comments)    SWELLING REACTION UNSPECIFIED     Family History  Adopted: Yes  Problem Relation Age of Onset  . Osteoporosis Mother   . Heart disease Mother   . Hypertension Mother   . Hyperlipidemia Mother   . Stroke Mother   . Arthritis Mother   . Diabetes Father   . Heart attack  Father   . Cancer Sister        breast  . Colon cancer Neg Hx     Social History   Socioeconomic History  . Marital status: Divorced    Spouse name: Not on file  . Number of children: 1  . Years of education: 30  . Highest education level: Not on file  Occupational History  . Occupation: Retired  Tobacco Use  . Smoking status: Never Smoker  . Smokeless tobacco: Never Used  Vaping Use  . Vaping Use: Never used  Substance and Sexual Activity  . Alcohol use: Not Currently    Alcohol/week: 3.0 standard drinks    Types: 3 Cans of beer per week  . Drug use: No  . Sexual activity: Yes  Other Topics Concern  . Not on file  Social History Narrative   Lives at home w/ significant other   Right-handed   Drinks 2 cups caffeine per day   Social Determinants of Health   Financial Resource Strain: Not on file  Food Insecurity: Not on file  Transportation Needs: Not on file  Physical Activity: Not on file  Stress: Not on file  Social Connections: Not on file  Intimate Partner Violence: Not on file     Constitutional: Denies fever, malaise, fatigue, headache or abrupt weight changes.  Respiratory: Denies difficulty breathing, shortness of breath, cough or sputum production.   Cardiovascular: Denies chest pain, chest tightness, palpitations or swelling in the hands or feet.  Skin: Pt reports itching. Denies redness, lesions or ulcercations.   No other specific complaints in a complete review of systems (except as listed in HPI above).  Objective:   Physical Exam  BP 124/68   Pulse 77   Temp 98 F (36.7 C) (Temporal)   Wt 149 lb (67.6 kg)   LMP 01/08/2012   SpO2 98%   BMI 26.39 kg/m   Wt Readings from Last 3 Encounters:  04/21/20 151 lb (68.5 kg)  02/24/20 153 lb (69.4 kg)  11/24/19 153 lb (69.4 kg)    General: Appears her stated age, well developed, well nourished in NAD. Skin: Dry skin with excoriation noted of upper and lower extremities. No rash  noted. Cardiovascular: Normal rate. Pulmonary/Chest: Normal effort. Neurological: [name] and oriented.   BMET    Component Value Date/Time   NA 141 04/21/2020 1011   K 5.0 04/21/2020 1011   CL 101 04/21/2020 1011   CO2 25 04/21/2020 1011   GLUCOSE 96 04/21/2020 1011  GLUCOSE 101 (H) 10/19/2019 1055   BUN 15 04/21/2020 1011   CREATININE 0.89 04/21/2020 1011   CREATININE 0.84 07/27/2015 1016   CALCIUM 10.2 04/21/2020 1011   GFRNONAA 51 (L) 05/27/2017 1951   GFRAA 59 (L) 05/27/2017 1951    Lipid Panel     Component Value Date/Time   CHOL 170 04/21/2020 1011   TRIG 113 04/21/2020 1011   HDL 76 04/21/2020 1011   CHOLHDL 2.2 04/21/2020 1011   CHOLHDL 3 10/19/2019 1055   VLDL 23.8 10/19/2019 1055   LDLCALC 74 04/21/2020 1011    CBC    Component Value Date/Time   WBC 7.1 10/19/2019 1055   RBC 3.64 (L) 10/19/2019 1055   HGB 12.0 10/19/2019 1055   HCT 35.2 (L) 10/19/2019 1055   PLT 263.0 10/19/2019 1055   MCV 96.8 10/19/2019 1055   MCH 30.7 05/27/2017 1951   MCHC 34.1 10/19/2019 1055   RDW 13.7 10/19/2019 1055   LYMPHSABS 1.3 05/27/2017 1951   MONOABS 1.3 (H) 05/27/2017 1951   EOSABS 0.2 05/27/2017 1951   BASOSABS 0.0 05/27/2017 1951    Hgb A1C Lab Results  Component Value Date   HGBA1C 5.8 02/21/2015           Assessment & Plan:   Pruritis, Dry Skin:  Recommend moisturizing lotion like Aveeno, Eucerin or Cetaphil daily after bathing Stop Claritin Start Allegra OTC Continue Hydrocortisone Cream 1% OTC BID prn Could consider referral to dermatology if symptoms persist or worsen  Genital Herpes:  She will continue suppression until she is no longer sexually active Valacyclovir refilled today  Return precautions discussed Nicki Reaper, NP This visit occurred during the SARS-CoV-2 public health emergency.  Safety protocols were in place, including screening questions prior to the visit, additional usage of staff PPE, and extensive cleaning of exam  room while observing appropriate contact time as indicated for disinfecting solutions.

## 2020-05-23 ENCOUNTER — Other Ambulatory Visit: Payer: Self-pay | Admitting: Specialist

## 2020-06-09 ENCOUNTER — Other Ambulatory Visit: Payer: Self-pay | Admitting: Specialist

## 2020-06-16 ENCOUNTER — Other Ambulatory Visit: Payer: Self-pay | Admitting: Specialist

## 2020-06-20 ENCOUNTER — Other Ambulatory Visit: Payer: Self-pay | Admitting: Specialist

## 2020-07-10 ENCOUNTER — Other Ambulatory Visit: Payer: Self-pay | Admitting: Specialist

## 2020-07-25 ENCOUNTER — Other Ambulatory Visit: Payer: Self-pay | Admitting: Specialist

## 2020-08-02 ENCOUNTER — Other Ambulatory Visit: Payer: Self-pay | Admitting: Specialist

## 2020-08-08 ENCOUNTER — Other Ambulatory Visit: Payer: Self-pay | Admitting: Specialist

## 2020-08-20 ENCOUNTER — Other Ambulatory Visit: Payer: Self-pay | Admitting: Internal Medicine

## 2020-08-22 NOTE — Telephone Encounter (Signed)
Last OV- 05/09/2020 Next OV - N/A Last Filled - 11/22/2019

## 2020-08-23 ENCOUNTER — Ambulatory Visit: Payer: Medicare Other | Admitting: Specialist

## 2020-08-23 ENCOUNTER — Other Ambulatory Visit: Payer: Self-pay

## 2020-08-23 ENCOUNTER — Other Ambulatory Visit: Payer: Self-pay | Admitting: Specialist

## 2020-08-23 ENCOUNTER — Encounter: Payer: Self-pay | Admitting: Specialist

## 2020-08-23 VITALS — BP 174/69 | HR 53 | Ht 63.0 in | Wt 149.0 lb

## 2020-08-23 DIAGNOSIS — M4156 Other secondary scoliosis, lumbar region: Secondary | ICD-10-CM | POA: Diagnosis not present

## 2020-08-23 DIAGNOSIS — M4316 Spondylolisthesis, lumbar region: Secondary | ICD-10-CM | POA: Diagnosis not present

## 2020-08-23 DIAGNOSIS — M51369 Other intervertebral disc degeneration, lumbar region without mention of lumbar back pain or lower extremity pain: Secondary | ICD-10-CM

## 2020-08-23 DIAGNOSIS — M5136 Other intervertebral disc degeneration, lumbar region: Secondary | ICD-10-CM

## 2020-08-23 DIAGNOSIS — Z9889 Other specified postprocedural states: Secondary | ICD-10-CM

## 2020-08-23 DIAGNOSIS — M4807 Spinal stenosis, lumbosacral region: Secondary | ICD-10-CM

## 2020-08-23 DIAGNOSIS — M48062 Spinal stenosis, lumbar region with neurogenic claudication: Secondary | ICD-10-CM

## 2020-08-23 DIAGNOSIS — M1711 Unilateral primary osteoarthritis, right knee: Secondary | ICD-10-CM

## 2020-08-23 DIAGNOSIS — M47816 Spondylosis without myelopathy or radiculopathy, lumbar region: Secondary | ICD-10-CM

## 2020-08-23 NOTE — Patient Instructions (Signed)
Plan:Avoid bending, stooping and avoid lifting weights greater than 10 lbs. Avoid prolong standing and walking. Avoid frequent bending and stooping No lifting greater than 10 lbs. May use ice or moist heat for pain. Weight loss is of benefit. Handicap license is approved. Exercise with stationary bike or pool walking execises. Walk to tolerance and perform flexion exercises, stomach crunches, leg lifts and Avoid backwards bending. Ultracet for pain when arthritis meds are not helpful. Follow-Up Instructions: No follow-ups on file.    Orders:  No orders of the defined types were placed in this encounter.   No orders of the defined types were placed in this encounter.

## 2020-08-23 NOTE — Progress Notes (Signed)
Office Visit Note   Patient: Heather [name]           Date of Birth: May 03, 1945           MRN: [id number] Visit Date: 08/23/2020              Requested by: Lorre Munroe, NP [address] [place],  [place] [address] PCP: Lorre Munroe, NP   Assessment & Plan: Visit Diagnoses:  1. Status post lumbar laminectomy   2. Spinal stenosis of lumbosacral region   3. Other secondary scoliosis, lumbar region   4. Spondylolisthesis, lumbar region   5. Unilateral primary osteoarthritis, right knee   6. Spinal stenosis of lumbar region with neurogenic claudication   7. Degenerative disc disease, lumbar   8. Spondylosis without myelopathy or radiculopathy, lumbar region     Plan:Avoid bending, stooping and avoid lifting weights greater than 10 lbs. Avoid prolong standing and walking. Avoid frequent bending and stooping  No lifting greater than 10 lbs. May use ice or moist heat for pain. Weight loss is of benefit. Handicap license is approved. Exercise with stationary bike or pool walking execises. Walk to tolerance and perform flexion exercises, stomach crunches, leg lifts and  Avoid backwards bending. Ultracet for pain when arthritis meds are not helpful. Follow-Up Instructions: No follow-ups on file.   Orders:  No orders of the defined types were placed in this encounter.  No orders of the defined types were placed in this encounter.     Procedures: No procedures performed   Clinical Data: No additional findings.   Subjective: Chief Complaint  Patient presents with   Lower Back - Follow-up    75 year old female with history of lumbar spondylosis and spinal stenosis post lumbar laminectomies and she is having increasing pain into the right buttock and hip and with standing and walking. She has less pain with sitting but gets stiffness with prolong sitting. In the AM difficulty straightening up with standingup in the AM. She has difficulty standing in one place. She has has  10 major surgeries and she is concern about affects of anesthesia.   Review of Systems   Objective: Vital Signs: BP (!) 174/69 (BP Location: Left Arm, Patient Position: Sitting)   Pulse (!) 53   Ht 5\' 3"  (1.6 m)   Wt 149 lb (67.6 kg)   LMP 01/08/2012   BMI 26.39 kg/m   Physical Exam Musculoskeletal:     Lumbar back: Negative right straight leg raise test and negative left straight leg raise test.   Back Exam   Tenderness  The patient is experiencing tenderness in the lumbar.  Range of Motion  Extension:  abnormal  Flexion:  abnormal  Lateral bend right:  abnormal  Lateral bend left:  abnormal  Rotation right:  abnormal  Rotation left:  abnormal   Muscle Strength  Right Quadriceps:  5/5  Left Quadriceps:  5/5  Right Hamstrings:  5/5  Left Hamstrings:  5/5   Tests  Straight leg raise right: negative Straight leg raise left: negative  Reflexes  Patellar:  2/4 Achilles:  2/4 Babinski's sign: normal   Other  Toe walk: normal Heel walk: normal Sensation: normal Gait: abnormal     Specialty Comments:  No specialty comments available.  Imaging: No results found.   PMFS History: Patient Active Problem List   Diagnosis Date Noted   OAB (overactive bladder) 11/18/2017   Hypertension 12/17/2016   Lumbar stenosis with neurogenic claudication 07/03/2016  Carotid disease, bilateral (HCC) 03/03/2015   Seasonal allergies 03/03/2014   Genital herpes 03/03/2014   Hyperlipidemia 03/03/2013   Anxiety and depression 12/11/2011   Past Medical History:  Diagnosis Date   Anxiety    Arthritis    "knees, right hip, lumbar back" (12/17/2016)   Chicken pox    Chronic lower back pain    DDD (degenerative disc disease), lumbar    Depression    Headache 06/23/2015   "2d after carotid OR & again today" (12/17/2016)   Heart murmur    MVP   High cholesterol    History of blood transfusion 1975   "related to femur  fracture" (12/17/2016)   HOH (hard of hearing)     left   Hypertension    MVP (mitral valve prolapse)    echo 1986-mild   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   Orogenital transmission (husband had cold sore; HSV1 culture positive)   Shortness of breath dyspnea    WITH EXERTION     Spinal stenosis of lumbar region    Stroke [facility]) 04/2015   "light stroke; sometimes my thinking is slow since" (12/17/2016)   Wears glasses     Family History  Adopted: Yes  Problem Relation Age of Onset   Osteoporosis Mother    Heart disease Mother    Hypertension Mother    Hyperlipidemia Mother    Stroke Mother    Arthritis Mother    Diabetes Father    Heart attack Father    Cancer Sister        breast   Colon cancer Neg Hx     Past Surgical History:  Procedure Laterality Date   CARPAL TUNNEL RELEASE Right 03/31/2013   Procedure: RIGHT CARPAL TUNNEL RELEASE;  Surgeon: Nicki Reaper, MD;  Location: [facility];  Service: Orthopedics;  Laterality: Right;   ENDARTERECTOMY Right 05/03/2015   Procedure: RIGHT CAROTID ENDARTERECTOMY ;  Surgeon: Fransisco Hertz, MD;  Location: [facility] OR;  Service: Vascular;  Laterality: Right;   FEMUR FRACTURE SURGERY Right 1975   "hip"   FRACTURE SURGERY     LUMBAR LAMINECTOMY N/A 05/26/2017   Procedure: BILATERAL PARTIAL HEMILAMINECTOMIES L3-4 AND L4-5;  Surgeon: Kerrin Champagne, MD;  Location: [facility] OR;  Service: Orthopedics;  Laterality: N/A;   NASAL SINUS SURGERY  1975   PLANTAR FASCIA RELEASE Bilateral 1968   SHOULDER OPEN ROTATOR CUFF REPAIR Right 1998   TONSILLECTOMY  1951   TUBAL LIGATION  1995   Social History   Occupational History   Occupation: Retired  Tobacco Use   Smoking status: Never   Smokeless tobacco: Never  Vaping Use   Vaping Use: Never used  Substance and Sexual Activity   Alcohol use: Not Currently    Alcohol/week: 3.0 standard drinks    Types: 3 Cans of beer per week   Drug use: No   Sexual activity: Yes

## 2020-09-05 ENCOUNTER — Other Ambulatory Visit: Payer: Self-pay | Admitting: Specialist

## 2020-09-06 ENCOUNTER — Ambulatory Visit (INDEPENDENT_AMBULATORY_CARE_PROVIDER_SITE_OTHER): Payer: Medicare Other | Admitting: Nurse Practitioner

## 2020-09-06 ENCOUNTER — Encounter: Payer: Self-pay | Admitting: Nurse Practitioner

## 2020-09-06 ENCOUNTER — Other Ambulatory Visit: Payer: Self-pay

## 2020-09-06 VITALS — BP 160/72 | HR 55 | Temp 97.9°F | Resp 16 | Ht 63.25 in | Wt 146.5 lb

## 2020-09-06 DIAGNOSIS — E782 Mixed hyperlipidemia: Secondary | ICD-10-CM | POA: Diagnosis not present

## 2020-09-06 DIAGNOSIS — I6523 Occlusion and stenosis of bilateral carotid arteries: Secondary | ICD-10-CM | POA: Diagnosis not present

## 2020-09-06 DIAGNOSIS — F419 Anxiety disorder, unspecified: Secondary | ICD-10-CM | POA: Diagnosis not present

## 2020-09-06 DIAGNOSIS — K4090 Unilateral inguinal hernia, without obstruction or gangrene, not specified as recurrent: Secondary | ICD-10-CM

## 2020-09-06 DIAGNOSIS — I10 Essential (primary) hypertension: Secondary | ICD-10-CM

## 2020-09-06 DIAGNOSIS — F32A Depression, unspecified: Secondary | ICD-10-CM

## 2020-09-06 DIAGNOSIS — R2232 Localized swelling, mass and lump, left upper limb: Secondary | ICD-10-CM

## 2020-09-06 NOTE — Assessment & Plan Note (Signed)
Currently maintained on atorvastatin.  Last LDL was within normal limits.  Continue atorvastatin.

## 2020-09-06 NOTE — Assessment & Plan Note (Signed)
Currently not at goal.  States cardiology manages her high blood pressure.  Currently takes metoprolol daily.  Today's blood pressure and last month office visit blood pressure was above goal.  Patient does have ability to check blood pressure at home but states she does not.  Asymptomatic in office.  Informed patient she needs to check her blood pressure 3 times weekly and I will call next week for the blood pressure readings.  Pending readings reach out to cardiology for medication management or I will change medications personally.

## 2020-09-06 NOTE — Assessment & Plan Note (Signed)
History of right-sided carotid enterectomy.  Currently maintained on atorvastatin.  Also followed by cardiology.  Continue atorvastatin and follow-up with specialties as scheduled.

## 2020-09-06 NOTE — Patient Instructions (Addendum)
I need you to check you blood pressure at home 3 times a week. I will be calling next week to get the readings from you. We will get the image to determine what the "knot" is Follow up in 2-3 months for Wellness exam

## 2020-09-06 NOTE — Assessment & Plan Note (Signed)
Patient states been there for years.  Patient did not mention that happened to notice on exam.  Soft nontender large mass.  States her father had something similar on the back and was seen at the [place] many times but nothing was ever said.  We will pursue ultrasound to see what masses. Pending imaging results.

## 2020-09-06 NOTE — Assessment & Plan Note (Signed)
This is a suspected diagnosis.  Given exam discussed with patient signs and symptoms of incarceration of hernia and when she needs to seek urgent or emergent medical care.  Patient acknowledged.  We will pursue ultrasound for confirmation of diagnosis.  Patient did mention that she does not want to have surgery if she does not have to.  She states she is had plenty within her lifetime.  Depending on imaging.  We can practice watchful waiting and before. Pending imaging results

## 2020-09-06 NOTE — Progress Notes (Signed)
Established Patient Office Visit  Subjective:  Patient ID: Heather [name], female    DOB: 1945-12-20  Age: 75 y.o. MRN: [id number]  CC:  Chief Complaint  Patient presents with   Transfer of Care    From [facility]   Abdominal Pain    Lower abdomen-top of pelvic area-feels like a knot. Patient's Ortho mentioned hernia and advised to check with PCP    HPI Heather [name] presents for transfer of care  Hypertension: Cardiology manages BP. Has the ability to check her blood pressure at home but doesn't. Today's reading is elevated and an office visit from last month was elevated.  Carotid Disease: history of bilateral carotid disease. Went for CEA on the right side in 2017. Was cleared from vascular surgery and has not followed up since. She does have a cardiologist that follows her.  Hyperlipidemia:no particular diet. Varity of food. Like salads and fruit. Does not have an exercise program.   Abdominal "knot": approx 1 month ago she noticed it. Does cause pain intermittently, dull pain. No particular time does she notice it. No history of hernias, bladder, or uterine prolapse.  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   Orogenital transmission (husband had cold sore; HSV1 culture positive)   Shortness of breath dyspnea    WITH EXERTION     Spinal stenosis of lumbar region    Stroke [facility]) 04/2015   "light stroke; sometimes my thinking is slow since" (12/17/2016)    Wears glasses     Past Surgical History:  Procedure Laterality Date   CARPAL TUNNEL RELEASE Right 03/31/2013   Procedure: RIGHT CARPAL TUNNEL RELEASE;  Surgeon: Wynonia Sours, MD;  Location: [place];  Service: Orthopedics;  Laterality: Right;   ENDARTERECTOMY Right 05/03/2015   Procedure: RIGHT CAROTID ENDARTERECTOMY ;  Surgeon: Conrad [place], MD;  Location: [place];  Service: Vascular;  Laterality: Right;   FEMUR FRACTURE SURGERY Right 1975   "hip"   FRACTURE SURGERY     LUMBAR LAMINECTOMY N/A 05/26/2017   Procedure: BILATERAL PARTIAL HEMILAMINECTOMIES L3-4 AND L4-5;  Surgeon: Jessy Oto, MD;  Location: [place];  Service: Orthopedics;  Laterality: N/A;   NASAL SINUS SURGERY  1975   PLANTAR FASCIA RELEASE Bilateral 1968   SHOULDER OPEN ROTATOR CUFF REPAIR Right 1998   TONSILLECTOMY  1951   TUBAL LIGATION  1995    Family History  Adopted: Yes  Problem Relation Age of Onset   Osteoporosis Mother    Heart disease Mother    Hypertension Mother    Hyperlipidemia Mother    Stroke Mother    Arthritis Mother    Diabetes Father    Heart attack Father    Cancer Sister        breast   Colon cancer Neg Hx  Social History   Socioeconomic History   Marital status: Divorced    Spouse name: Not on file   Number of children: 1   Years of education: 12   Highest education level: Not on file  Occupational History   Occupation: Retired  Tobacco Use   Smoking status: Never   Smokeless tobacco: Never  Vaping Use   Vaping Use: Never used  Substance and Sexual Activity   Alcohol use: Not Currently    Alcohol/week: 3.0 standard drinks    Types: 3 Cans of beer per week   Drug use: No   Sexual activity: Yes  Other Topics Concern   Not on file  Social History Narrative   Lives at home w/ significant other   Right-handed   Drinks 2 cups caffeine per day   Social Determinants of Health   Financial Resource Strain: Not on file  Food Insecurity: Not on file   Transportation Needs: Not on file  Physical Activity: Not on file  Stress: Not on file  Social Connections: Not on file  Intimate Partner Violence: Not on file    Outpatient Medications Prior to Visit  Medication Sig Dispense Refill   Ascorbic Acid (VITAMIN C) 1000 MG tablet Take 1,000 mg by mouth daily.     aspirin EC 81 MG tablet Take 1 tablet (81 mg total) by mouth daily.     atorvastatin (LIPITOR) 40 MG tablet Take 1 tablet (40 mg total) by mouth daily at 6 PM. 90 tablet 3   busPIRone (BUSPAR) 7.5 MG tablet TAKE 1 TABLET BY MOUTH 2 TIMES DAILY. 180 tablet 2   Cholecalciferol (VITAMIN D3) 1000 UNITS CAPS Take 1,000 Units by mouth daily.      gabapentin (NEURONTIN) 300 MG capsule TAKE 1 CAPSULE BY MOUTH TWICE A DAY 180 capsule 4   lidocaine (XYLOCAINE) 2 % jelly Apply 1 application topically as needed. Apply to the skin over the knees and hips up to BID 30 mL 6   loratadine (CLARITIN) 10 MG tablet Take 10 mg by mouth daily.     methocarbamol (ROBAXIN) 500 MG tablet TAKE 1 TABLET BY MOUTH EVERY 8 HOURS AS NEEDED FOR MUSCLE SPASMS 40 tablet 1   metoprolol tartrate (LOPRESSOR) 25 MG tablet Take 1 tablet (25 mg total) by mouth 2 (two) times daily. 180 tablet 3   Multiple Vitamin (MULTIVITAMIN) tablet Take 1 tablet by mouth daily.     sertraline (ZOLOFT) 100 MG tablet TAKE 1 AND 1/2 TABLETS DAILY BY MOUTH 135 tablet 0   traMADol-acetaminophen (ULTRACET) 37.5-325 MG tablet TAKE 1 TABLET BY MOUTH EVERY 4 HOURS AS NEEDED FOR UP TO 7 DAYS. 40 tablet 0   valACYclovir (VALTREX) 500 MG tablet Take 1 tablet (500 mg total) by mouth daily. 90 tablet 1   fexofenadine (ALLEGRA ALLERGY) 180 MG tablet Take 1 tablet (180 mg total) by mouth daily. 30 tablet 11   No facility-administered medications prior to visit.    Allergies  Allergen Reactions   Sulfamethoxazole Swelling and Other (See Comments)    SWELLING REACTION UNSPECIFIED     ROS Review of Systems  Constitutional:  Negative for chills,  fever and unexpected weight change.  Respiratory:  Negative for shortness of breath and wheezing.   Cardiovascular:  Negative for chest pain.  Gastrointestinal:  Negative for abdominal pain (knot to left lower abdomen), diarrhea, nausea and vomiting.  Musculoskeletal:  Positive for back pain.  Skin:  Negative for color change and pallor.  Neurological:  Positive  for numbness.  Psychiatric/Behavioral:  Negative for agitation, confusion and suicidal ideas.      Objective:    Physical Exam Vitals and nursing note reviewed.  Constitutional:      Appearance: She is normal weight.  Cardiovascular:     Rate and Rhythm: Normal rate and regular rhythm.  Pulmonary:     Effort: Pulmonary effort is normal.     Breath sounds: Normal breath sounds.  Abdominal:     General: Bowel sounds are normal.     Palpations: Abdomen is soft. There is no mass.     Tenderness: There is no abdominal tenderness.     Hernia: A hernia is present. Hernia is present in the left inguinal area.  Skin:    General: Skin is warm.       Neurological:     Mental Status: She is alert. Mental status is at baseline.  Psychiatric:        Mood and Affect: Mood normal.        Thought Content: Thought content normal.        Judgment: Judgment normal.    BP (!) 160/72   Pulse (!) 55   Temp 97.9 F (36.6 C)   Resp 16   Ht 5' 3.25" (1.607 m)   Wt 146 lb 8 oz (66.5 kg)   LMP 01/08/2012   SpO2 97%   BMI 25.75 kg/m  Wt Readings from Last 3 Encounters:  09/06/20 146 lb 8 oz (66.5 kg)  08/23/20 149 lb (67.6 kg)  05/09/20 149 lb (67.6 kg)     Health Maintenance Due  Topic Date Due   COVID-19 Vaccine (1) Never done   Zoster Vaccines- Shingrix (1 of 2) Never done   TETANUS/TDAP  04/19/2020   INFLUENZA VACCINE  08/28/2020    There are no preventive care reminders to display for this patient.  Lab Results  Component Value Date   TSH 1.81 03/03/2013   Lab Results  Component Value Date   WBC 7.1 10/19/2019    HGB 12.0 10/19/2019   HCT 35.2 (L) 10/19/2019   MCV 96.8 10/19/2019   PLT 263.0 10/19/2019   Lab Results  Component Value Date   NA 141 04/21/2020   K 5.0 04/21/2020   CO2 25 04/21/2020   GLUCOSE 96 04/21/2020   BUN 15 04/21/2020   CREATININE 0.89 04/21/2020   BILITOT 0.4 10/19/2019   ALKPHOS 82 10/19/2019   AST 17 10/19/2019   ALT 18 04/21/2020   PROT 6.3 10/19/2019   ALBUMIN 4.4 10/19/2019   CALCIUM 10.2 04/21/2020   ANIONGAP 6 05/27/2017   EGFR 68 04/21/2020   GFR 59.55 (L) 10/19/2019   Lab Results  Component Value Date   CHOL 170 04/21/2020   Lab Results  Component Value Date   HDL 76 04/21/2020   Lab Results  Component Value Date   LDLCALC 74 04/21/2020   Lab Results  Component Value Date   TRIG 113 04/21/2020   Lab Results  Component Value Date   CHOLHDL 2.2 04/21/2020   Lab Results  Component Value Date   HGBA1C 5.8 02/21/2015      Assessment & Plan:   Problem List Items Addressed This Visit       Cardiovascular and Mediastinum   Carotid disease, bilateral ([place])    History of right-sided carotid enterectomy.  Currently maintained on atorvastatin.  Also followed by cardiology.  Continue atorvastatin and follow-up with specialties as scheduled.       Hypertension  Currently not at goal.  States cardiology manages her high blood pressure.  Currently takes metoprolol daily.  Today's blood pressure and last month office visit blood pressure was above goal.  Patient does have ability to check blood pressure at home but states she does not.  Asymptomatic in office.  Informed patient she needs to check her blood pressure 3 times weekly and I will call next week for the blood pressure readings.  Pending readings reach out to cardiology for medication management or I will change medications personally.         Other   Anxiety and depression   Hyperlipidemia    Currently maintained on atorvastatin.  Last LDL was within normal limits.  Continue  atorvastatin.       Non-recurrent unilateral inguinal hernia without obstruction or gangrene - Primary    This is a suspected diagnosis.  Given exam discussed with patient signs and symptoms of incarceration of hernia and when she needs to seek urgent or emergent medical care.  Patient acknowledged.  We will pursue ultrasound for confirmation of diagnosis.  Patient did mention that she does not want to have surgery if she does not have to.  She states she is had plenty within her lifetime.  Depending on imaging.  We can practice watchful waiting and before. Pending imaging results       Relevant Orders   US Pelvis Limited   Mass of left forearm    Patient states been there for years.  Patient did not mention that happened to notice on exam.  Soft nontender large mass.  States her father had something similar on the back and was seen at the [place] many times but nothing was ever said.  We will pursue ultrasound to see what masses. Pending imaging results.       Relevant Orders   [place] LT UPPER EXTREM LTD SOFT TISSUE NON VASCULAR    No orders of the defined types were placed in this encounter.   Follow-up: Return in about 2 months (around 11/06/2020) for Medicare wellness exam.    This visit occurred during the SARS-CoV-2 public health emergency.  Safety protocols were in place, including screening questions prior to the visit, additional usage of staff PPE, and extensive cleaning of exam room while observing appropriate contact time as indicated for disinfecting solutions.   Romilda Garret, NP

## 2020-09-12 ENCOUNTER — Ambulatory Visit
Admission: RE | Admit: 2020-09-12 | Discharge: 2020-09-12 | Disposition: A | Discharge status: Home / Self Care | Payer: Medicare Other | Source: Ambulatory Visit | Attending: Nurse Practitioner | Admitting: Nurse Practitioner

## 2020-09-12 ENCOUNTER — Other Ambulatory Visit: Payer: Self-pay

## 2020-09-12 DIAGNOSIS — K4090 Unilateral inguinal hernia, without obstruction or gangrene, not specified as recurrent: Secondary | ICD-10-CM

## 2020-09-12 DIAGNOSIS — R2232 Localized swelling, mass and lump, left upper limb: Secondary | ICD-10-CM

## 2020-09-17 ENCOUNTER — Other Ambulatory Visit: Payer: Self-pay | Admitting: Internal Medicine

## 2020-09-17 NOTE — Telephone Encounter (Signed)
PCP is Mordecai Maes NP- not at this practice

## 2020-09-18 ENCOUNTER — Other Ambulatory Visit: Payer: Self-pay | Admitting: Specialist

## 2020-09-23 ENCOUNTER — Other Ambulatory Visit: Payer: Self-pay | Admitting: Internal Medicine

## 2020-09-27 ENCOUNTER — Other Ambulatory Visit: Payer: Self-pay | Admitting: Nurse Practitioner

## 2020-09-27 DIAGNOSIS — A6004 Herpesviral vulvovaginitis: Secondary | ICD-10-CM

## 2020-09-27 MED ORDER — VALACYCLOVIR HCL 500 MG PO TABS: 500.0000 mg | ORAL_TABLET | Freq: Every day | ORAL | 1 refills | Status: DC

## 2020-10-04 ENCOUNTER — Telehealth: Payer: Self-pay | Admitting: Specialist

## 2020-10-04 ENCOUNTER — Other Ambulatory Visit: Payer: Self-pay | Admitting: Internal Medicine

## 2020-10-04 ENCOUNTER — Other Ambulatory Visit: Payer: Self-pay | Admitting: Surgery

## 2020-10-04 NOTE — Telephone Encounter (Signed)
Pt called stating she needs a refill on her tramadol and would like a CB when that's done.   [phone]

## 2020-10-04 NOTE — Telephone Encounter (Signed)
Sent request to Dr. Otelia Sergeant to review for approval---pharmacy sent the request

## 2020-10-06 NOTE — Telephone Encounter (Signed)
Patient is aware meds have been sent in

## 2020-10-17 ENCOUNTER — Other Ambulatory Visit: Payer: Self-pay | Admitting: Surgery

## 2020-10-31 ENCOUNTER — Other Ambulatory Visit: Payer: Self-pay | Admitting: Specialist

## 2020-11-08 ENCOUNTER — Ambulatory Visit: Payer: Medicare Other | Admitting: Nurse Practitioner

## 2020-11-09 ENCOUNTER — Other Ambulatory Visit: Payer: Self-pay | Admitting: Specialist

## 2020-11-23 ENCOUNTER — Other Ambulatory Visit: Payer: Self-pay | Admitting: Specialist

## 2020-11-23 DIAGNOSIS — Z9889 Other specified postprocedural states: Secondary | ICD-10-CM

## 2020-11-30 ENCOUNTER — Other Ambulatory Visit: Payer: Self-pay | Admitting: Specialist

## 2020-12-11 ENCOUNTER — Other Ambulatory Visit: Payer: Self-pay | Admitting: Specialist

## 2020-12-11 DIAGNOSIS — Z9889 Other specified postprocedural states: Secondary | ICD-10-CM

## 2020-12-19 ENCOUNTER — Other Ambulatory Visit: Payer: Self-pay | Admitting: Specialist

## 2020-12-27 ENCOUNTER — Ambulatory Visit: Payer: Self-pay

## 2020-12-27 ENCOUNTER — Ambulatory Visit: Payer: Medicare Other | Admitting: Specialist

## 2020-12-27 ENCOUNTER — Encounter: Payer: Self-pay | Admitting: Specialist

## 2020-12-27 ENCOUNTER — Other Ambulatory Visit: Payer: Self-pay

## 2020-12-27 VITALS — BP 146/73 | HR 60 | Ht 63.25 in | Wt 146.5 lb

## 2020-12-27 DIAGNOSIS — M4156 Other secondary scoliosis, lumbar region: Secondary | ICD-10-CM

## 2020-12-27 DIAGNOSIS — Z8679 Personal history of other diseases of the circulatory system: Secondary | ICD-10-CM

## 2020-12-27 DIAGNOSIS — M25531 Pain in right wrist: Secondary | ICD-10-CM | POA: Diagnosis not present

## 2020-12-27 DIAGNOSIS — Z9889 Other specified postprocedural states: Secondary | ICD-10-CM

## 2020-12-27 DIAGNOSIS — S62001A Unspecified fracture of navicular [scaphoid] bone of right wrist, initial encounter for closed fracture: Secondary | ICD-10-CM

## 2020-12-27 DIAGNOSIS — M1712 Unilateral primary osteoarthritis, left knee: Secondary | ICD-10-CM | POA: Diagnosis not present

## 2020-12-27 DIAGNOSIS — M47816 Spondylosis without myelopathy or radiculopathy, lumbar region: Secondary | ICD-10-CM

## 2020-12-27 DIAGNOSIS — M4807 Spinal stenosis, lumbosacral region: Secondary | ICD-10-CM

## 2020-12-27 DIAGNOSIS — M1711 Unilateral primary osteoarthritis, right knee: Secondary | ICD-10-CM

## 2020-12-27 DIAGNOSIS — M4316 Spondylolisthesis, lumbar region: Secondary | ICD-10-CM

## 2020-12-27 MED ORDER — METHYLPREDNISOLONE ACETATE 40 MG/ML IJ SUSP
40.0000 mg | INTRAMUSCULAR | Status: AC | PRN
  Administered 2020-12-27: 40 mg via INTRA_ARTICULAR

## 2020-12-27 MED ORDER — BUPIVACAINE HCL 0.5 % IJ SOLN
5.0000 mL | INTRAMUSCULAR | Status: AC | PRN
  Administered 2020-12-27: 5 mL via INTRA_ARTICULAR

## 2020-12-27 NOTE — Progress Notes (Addendum)
Office Visit Note   Patient: Heather [name]           Date of Birth: 1945-06-29           MRN: [id number] Visit Date: 12/27/2020              Requested by: Jearld Fenton, NP [place],  [place] [address] PCP: Michela Pitcher, NP   Assessment & Plan: Visit Diagnoses:  1. Status post lumbar laminectomy   2. Spinal stenosis of lumbosacral region   3. Other secondary scoliosis, lumbar region   4. Spondylolisthesis, lumbar region   5. Spondylosis without myelopathy or radiculopathy, lumbar region   6. History of ASCVD (atherosclerotic cardiovascular disease)   7. Unilateral primary osteoarthritis, left knee   8. Spondylolisthesis of lumbar region   9. Pain in right wrist   10. Closed nondisplaced fracture of scaphoid of right wrist, unspecified portion of scaphoid, initial encounter     Plan: Avoid bending, stooping and avoid lifting weights greater than 10 lbs. Avoid prolong standing and walking. Avoid frequent bending and stooping  No lifting greater than 10 lbs. May use ice or moist heat for pain. Weight loss is of benefit. Handicap license is approved Knee is suffering from osteoarthritis, only real proven treatments are Weight loss,Tylenol and exercise. Well padded shoes help. Ice the knee that is suffering from osteoarthritis, only real proven treatments are Diclofenac gel applied 3-4 times per day Tylenol up to 3000 mg total per day. Well padded shoes help. Ice the knee 2-3 times a day 15-20 mins at a time.-3 times a day 15-20 mins at a time. Hot showers in the AM.  Injection with steroid may be of benefit. Hemp CBD capsules, amazon.com 5,000-7,000 mg per bottle, 60 capsules per bottle, take one capsule twice a day. Cane in the left hand to use with left leg weight bearing. Follow-Up Instructions: No follow-ups on file.   Decrease the gabapentin one tablet every 4-5 days and remain on just the night dose.  Right wrist has radiographic appearance of a  nondisplaced scaphoid fracture, an MRI is recommended to confirm the diagnosis and to assess for any signs of displacement if the study confirms the diagnosis then A long arm cast may be necessary to maintain the aligment, if the study shows a displaced fracture then Surgery may be indicated.  Follow-Up Instructions: Return in about 2 weeks (around 01/10/2021).   Orders:  Orders Placed This Encounter  Procedures  . XR Lumbar Spine 2-3 Views  . MR Lumbar Spine W Wo Contrast  . XR Wrist Complete Right  . CT WRIST RIGHT WO CONTRAST   No orders of the defined types were placed in this encounter.     Procedures: Large Joint Inj: bilateral knee on 12/27/2020 12:16 PM Indications: pain Details: 25 G 1.5 in needle, anterolateral approach  Arthrogram: No  Medications (Right): 5 mL bupivacaine 0.5 %; 40 mg methylPREDNISolone acetate 40 MG/ML Medications (Left): 5 mL bupivacaine 0.5 %; 40 mg methylPREDNISolone acetate 40 MG/ML Outcome: tolerated well, no immediate complications Procedure, treatment alternatives, risks and benefits explained, specific risks discussed. Consent was given by the patient. Immediately prior to procedure a time out was called to verify the correct patient, procedure, equipment, support staff and site/side marked as required. Patient was prepped and draped in the usual sterile fashion.     Clinical Data: No additional findings.   Subjective: Chief Complaint  Patient presents with  . Lower Back -  Follow-up  . Right Knee - Pain    HPI  Review of Systems   Objective: Vital Signs: BP (!) 146/73 (BP Location: Left Arm, Patient Position: Sitting)   Pulse 60   Ht 5' 3.25" (1.607 m)   Wt 146 lb 8 oz (66.5 kg)   LMP 01/08/2012   BMI 25.75 kg/m   Physical Exam Constitutional:      Appearance: She is well-developed.  HENT:     Head: Normocephalic and atraumatic.  Eyes:     Pupils: Pupils are equal, round, and reactive to light.  Pulmonary:      Effort: Pulmonary effort is normal.     Breath sounds: Normal breath sounds.  Abdominal:     General: Bowel sounds are normal.     Palpations: Abdomen is soft.  Musculoskeletal:     Cervical back: Normal range of motion and neck supple.     Lumbar back: Negative right straight leg raise test and negative left straight leg raise test.  Skin:    General: Skin is warm and dry.  Neurological:     Mental Status: She is alert and oriented to person, place, and time.  Psychiatric:        Behavior: Behavior normal.        Thought Content: Thought content normal.        Judgment: Judgment normal.   Back Exam   Tenderness  The patient is experiencing tenderness in the lumbar.  Range of Motion  Extension:  abnormal  Flexion:  abnormal  Lateral bend right:  abnormal  Lateral bend left:  abnormal  Rotation right:  abnormal  Rotation left:  abnormal   Muscle Strength  The patient has normal back strength.  Tests  Straight leg raise right: negative Straight leg raise left: negative  Other  Toe walk: normal Heel walk: normal Sensation: normal Gait: normal     Specialty Comments:  No specialty comments available.  Imaging: XR Wrist Complete Right  Result Date: 12/27/2020 Right wrist AP, lateral and oblique radiographs show moderated DJD of the right thumb MC-C joint with joint line narrowing and lateral subluxation. There is sclerosis of the joint subchondrally. There is calcification in the area of the TFCC and the articulation of the right ulna with the lunate. A lucency is seen transversely through the middle 1/3 of the right scaphoid and it appears to represent a scaphoid fracture.  The scapholunate joint is normal width.   XR Lumbar Spine 2-3 Views  Result Date: 12/27/2020 AP and lateral radiographs of the lumbar spine in flexion and extension shows a mild lumbar scoliosis with multiple level DDD nearly every level, right lateral listhesis at L3-4 and anterolisthesis at  L4-5 that is grade 1-2 with vacuum sign at L4-5 and mild posterior spondylosis changes .     PMFS History: Patient Active Problem List   Diagnosis Date Noted  . Non-recurrent unilateral inguinal hernia without obstruction or gangrene 09/06/2020  . Mass of left forearm 09/06/2020  . OAB (overactive bladder) 11/18/2017  . Hypertension 12/17/2016  . Lumbar stenosis with neurogenic claudication 07/03/2016  . Carotid disease, bilateral ([facility]) 03/03/2015  . Seasonal allergies 03/03/2014  . Genital herpes 03/03/2014  . Hyperlipidemia 03/03/2013  . Anxiety and depression 12/11/2011   Past Medical History:  Diagnosis Date  . Anxiety   . Arthritis    "knees, right hip, lumbar back" (12/17/2016)  . Chicken pox   . Chronic lower back pain   . DDD (degenerative disc disease),  lumbar   . Depression   . Headache 06/23/2015   "2d after carotid OR & again today" (12/17/2016)  . Heart murmur    MVP  . High cholesterol   . History of blood transfusion 1975   "related to femur  fracture" (12/17/2016)  . HOH (hard of hearing)    left  . Hypertension   . MVP (mitral valve prolapse)    echo 1986-mild  . 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 Shortness of breath dyspnea    WITH EXERTION    . Spinal stenosis of lumbar region   . Stroke [facility]) 04/2015   "light stroke; sometimes my thinking is slow since" (12/17/2016)  . Wears glasses     Family History  Adopted: Yes  Problem Relation Age of Onset  . Osteoporosis Mother   . Heart disease Mother   . Hypertension Mother   . Hyperlipidemia Mother   . Stroke Mother   . Arthritis Mother   . Diabetes Father   . Heart attack Father   . Cancer Sister        breast  . Colon cancer Neg Hx     Past Surgical History:  Procedure Laterality Date  . CARPAL TUNNEL RELEASE Right 03/31/2013   Procedure: RIGHT CARPAL TUNNEL RELEASE;  Surgeon: Wynonia Sours, MD;   Location: [place];  Service: Orthopedics;  Laterality: Right;  . ENDARTERECTOMY Right 05/03/2015   Procedure: RIGHT CAROTID ENDARTERECTOMY ;  Surgeon: Conrad [place], MD;  Location: [place];  Service: Vascular;  Laterality: Right;  . FEMUR FRACTURE SURGERY Right 1975   "hip"  . FRACTURE SURGERY    . LUMBAR LAMINECTOMY N/A 05/26/2017   Procedure: BILATERAL PARTIAL HEMILAMINECTOMIES L3-4 AND L4-5;  Surgeon: Jessy Oto, MD;  Location: [place];  Service: Orthopedics;  Laterality: N/A;  . NASAL SINUS SURGERY  1975  . PLANTAR FASCIA RELEASE Bilateral 1968  . SHOULDER OPEN ROTATOR CUFF REPAIR Right 1998  . TONSILLECTOMY  1951  . TUBAL LIGATION  1995   Social History   Occupational History  . Occupation: Retired  Tobacco Use  . Smoking status: Never  . Smokeless tobacco: Never  Vaping Use  . Vaping Use: Never used  Substance and Sexual Activity  . Alcohol use: Not Currently    Alcohol/week: 3.0 standard drinks    Types: 3 Cans of beer per week  . Drug use: No  . Sexual activity: Yes

## 2020-12-27 NOTE — Patient Instructions (Addendum)
Avoid bending, stooping and avoid lifting weights greater than 10 lbs. Avoid prolong standing and walking. Avoid frequent bending and stooping  No lifting greater than 10 lbs. May use ice or moist heat for pain. Weight loss is of benefit. Handicap license is approved Knee is suffering from osteoarthritis, only real proven treatments are Weight loss,Tylenol and exercise. Well padded shoes help. Ice the knee that is suffering from osteoarthritis, only real proven treatments are Diclofenac gel applied 3-4 times per day Tylenol up to 3000 mg total per day. Well padded shoes help. Ice the knee 2-3 times a day 15-20 mins at a time.-3 times a day 15-20 mins at a time. Hot showers in the AM.  Injection with steroid may be of benefit. Hemp CBD capsules, amazon.com 5,000-7,000 mg per bottle, 60 capsules per bottle, take one capsule twice a day. Cane in the left hand to use with left leg weight bearing. Follow-Up Instructions: No follow-ups on file.   Decrease the gabapentin one tablet every 4-5 days and remain on just the night dose.  Right wrist has radiographic appearance of a nondisplaced scaphoid fracture, an MRI is recommended to confirm the diagnosis and to assess for any signs of displacement if the study confirms the diagnosis then A long arm cast may be necessary to maintain the aligment, if the study shows a displaced fracture then Surgery may be indicated.  Avoid lifting greater than a couple Oz weight with the left hand.

## 2021-01-01 ENCOUNTER — Other Ambulatory Visit: Payer: Self-pay | Admitting: Specialist

## 2021-01-01 DIAGNOSIS — Z9889 Other specified postprocedural states: Secondary | ICD-10-CM

## 2021-01-08 ENCOUNTER — Telehealth: Payer: Self-pay

## 2021-01-08 NOTE — Telephone Encounter (Signed)
Pt called and she doesn't have her CT scan until the 22nd she wanted to know if she still needs to come in Friday or does she need to R/S

## 2021-01-08 NOTE — Telephone Encounter (Signed)
Please have patient reschedule until she has her scan preformed.   Thank you

## 2021-01-11 ENCOUNTER — Other Ambulatory Visit: Payer: Self-pay | Admitting: Specialist

## 2021-01-12 ENCOUNTER — Ambulatory Visit: Payer: Medicare Other | Admitting: Specialist

## 2021-01-18 ENCOUNTER — Other Ambulatory Visit: Payer: Medicare Other

## 2021-01-25 ENCOUNTER — Other Ambulatory Visit: Payer: Self-pay | Admitting: Specialist

## 2021-01-25 DIAGNOSIS — Z9889 Other specified postprocedural states: Secondary | ICD-10-CM

## 2021-01-26 ENCOUNTER — Other Ambulatory Visit: Payer: Medicare Other

## 2021-01-30 ENCOUNTER — Other Ambulatory Visit: Payer: Self-pay | Admitting: Specialist

## 2021-01-30 ENCOUNTER — Other Ambulatory Visit: Payer: Self-pay | Admitting: Cardiovascular Disease

## 2021-02-07 ENCOUNTER — Ambulatory Visit (INDEPENDENT_AMBULATORY_CARE_PROVIDER_SITE_OTHER): Payer: Medicare Other | Admitting: Specialist

## 2021-02-07 ENCOUNTER — Other Ambulatory Visit: Payer: Self-pay

## 2021-02-07 ENCOUNTER — Encounter: Payer: Self-pay | Admitting: Specialist

## 2021-02-07 ENCOUNTER — Ambulatory Visit: Payer: Self-pay

## 2021-02-07 VITALS — BP 196/64 | HR 60 | Ht 63.25 in | Wt 146.5 lb

## 2021-02-07 DIAGNOSIS — S62001A Unspecified fracture of navicular [scaphoid] bone of right wrist, initial encounter for closed fracture: Secondary | ICD-10-CM

## 2021-02-07 DIAGNOSIS — M25531 Pain in right wrist: Secondary | ICD-10-CM | POA: Diagnosis not present

## 2021-02-07 NOTE — Patient Instructions (Signed)
Decrease the gabapentin one tablet every 4-5 days and remain on just the night dose.  Right wrist has radiographic appearance of a nondisplaced scaphoid fracture, an MRI is recommended to confirm the diagnosis and to assess for any signs of displacement if the study confirms the diagnosis then A long arm cast may be necessary to maintain the aligment, if the study shows a displaced fracture then Surgery may be indicated.

## 2021-02-07 NOTE — Progress Notes (Signed)
Office Visit Note   Patient: Heather [name]           Date of Birth: 1945/05/20           MRN: [id number] Visit Date: 02/07/2021              Requested by: Eden Emms, NP [address] [place],  [place] [address] PCP: Eden Emms, NP   Assessment & Plan: Visit Diagnoses:  1. Pain in right wrist   2. Closed nondisplaced fracture of scaphoid of right wrist, unspecified portion of scaphoid, initial encounter     Plan: Decrease the gabapentin one tablet every 4-5 days and remain on just the night dose.  Right wrist has radiographic appearance of a nondisplaced scaphoid fracture, an MRI is recommended to confirm the diagnosis and to assess for any signs of displacement if the study confirms the diagnosis then A long arm cast may be necessary to maintain the aligment, if the study shows a displaced fracture then Surgery may be indicated. Right wrist splint.  Follow-Up Instructions: Return in about 3 weeks (around 02/28/2021).   Orders:  Orders Placed This Encounter  Procedures   XR Wrist Complete Right   CT WRIST RIGHT WO CONTRAST   No orders of the defined types were placed in this encounter.     Procedures: No procedures performed   Clinical Data: No additional findings.   Subjective: Chief Complaint  Patient presents with   Lower Back - Follow-up    Was not able to get MRI due to not being able to afford it.   Right Wrist - Follow-up    Was not able to get CT due to not being able to afford it.    76 year old right handed female with history of right wrist pain post hyperextension injury while helping to load some Of husbands metal work into a truck bed in October. She has right wrist pain. Plain radiographs show a possible hairline right scaphoid fracture.  Review of Systems  Constitutional: Negative.   HENT: Negative.    Eyes: Negative.   Respiratory: Negative.    Cardiovascular: Negative.   Gastrointestinal: Negative.   Endocrine: Negative.    Genitourinary: Negative.   Musculoskeletal: Negative.   Skin: Negative.   Allergic/Immunologic: Negative.   Neurological: Negative.   Hematological: Negative.   Psychiatric/Behavioral: Negative.      Objective: Vital Signs: BP (!) 196/64 (BP Location: Left Arm, Patient Position: Sitting)    Pulse 60    Ht 5' 3.25" (1.607 m)    Wt 146 lb 8 oz (66.5 kg)    LMP 01/08/2012    BMI 25.75 kg/m   Physical Exam Constitutional:      Appearance: She is well-developed.  HENT:     Head: Normocephalic and atraumatic.  Eyes:     Pupils: Pupils are equal, round, and reactive to light.  Pulmonary:     Effort: Pulmonary effort is normal.     Breath sounds: Normal breath sounds.  Abdominal:     General: Bowel sounds are normal.     Palpations: Abdomen is soft.  Musculoskeletal:     Cervical back: Normal range of motion and neck supple.  Skin:    General: Skin is warm and dry.  Neurological:     Mental Status: She is [name] and oriented to person, place, and time.  Psychiatric:        Behavior: Behavior normal.  Thought Content: Thought content normal.        Judgment: Judgment normal.   Right Hand Exam   Tenderness  The patient is experiencing tenderness in the snuff box, dorsal area and palmar area.  Range of Motion  Wrist  Extension:  abnormal  Flexion:  abnormal   Tests  Phalens Sign: negative Tinel's sign (median nerve): negative Finkelstein's test: negative  Comments:  Pain with pressure over the right dorsal snuff box and with radial and ulnar deviation    Specialty Comments:  No specialty comments available.  Imaging: XR Wrist Complete Right  Result Date: 02/07/2021 Radiographs show area of lucency distal 30% transverse.     PMFS History: Patient Active Problem List   Diagnosis Date Noted   Non-recurrent unilateral inguinal hernia without obstruction or gangrene 09/06/2020   Mass of left forearm 09/06/2020   OAB (overactive bladder) 11/18/2017    Hypertension 12/17/2016   Lumbar stenosis with neurogenic claudication 07/03/2016   Carotid disease, bilateral (HCC) 03/03/2015   Seasonal allergies 03/03/2014   Genital herpes 03/03/2014   Hyperlipidemia 03/03/2013   Anxiety and depression 12/11/2011   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   Orogenital transmission (husband had cold sore; HSV1 culture positive)   Shortness of breath dyspnea    WITH EXERTION     Spinal stenosis of lumbar region    Stroke [facility](HCC) 04/2015   "light stroke; sometimes my thinking is slow since" (12/17/2016)   Wears glasses     Family History  Adopted: Yes  Problem Relation Age of Onset   Osteoporosis Mother    Heart disease Mother    Hypertension Mother    Hyperlipidemia Mother    Stroke Mother    Arthritis Mother    Diabetes Father    Heart attack Father    Cancer Sister        breast   Colon cancer Neg Hx     Past Surgical History:  Procedure Laterality Date   CARPAL TUNNEL RELEASE Right 03/31/2013   Procedure: RIGHT CARPAL TUNNEL RELEASE;  Surgeon: Nicki ReaperGary R Kuzma, MD;  Location: [facility];  Service: Orthopedics;  Laterality: Right;   ENDARTERECTOMY Right 05/03/2015   Procedure: RIGHT CAROTID ENDARTERECTOMY ;  Surgeon: Fransisco HertzBrian L Chen, MD;  [facility];  Service: Vascular;  Laterality: Right;   FEMUR FRACTURE SURGERY Right 1975   "hip"   FRACTURE SURGERY     LUMBAR LAMINECTOMY N/A 05/26/2017   Procedure:  BILATERAL PARTIAL HEMILAMINECTOMIES L3-4 AND L4-5;  Surgeon: Kerrin ChampagneNitka, Shakiyla Kook E, MD;  Location: [facility];  Service: Orthopedics;  Laterality: N/A;   NASAL SINUS SURGERY  1975   PLANTAR FASCIA RELEASE Bilateral 1968   SHOULDER OPEN ROTATOR CUFF REPAIR Right 1998   TONSILLECTOMY  1951   TUBAL LIGATION  1995   Social History  Occupational History   Occupation: Retired  Tobacco Use   Smoking status: Never   Smokeless tobacco: Never  Vaping Use   Vaping Use: Never used  Substance and Sexual Activity   Alcohol use: Not Currently    Alcohol/week: 3.0 standard drinks    Types: 3 Cans of beer per week   Drug use: No   Sexual activity: Yes

## 2021-02-14 ENCOUNTER — Other Ambulatory Visit: Payer: Self-pay | Admitting: Specialist

## 2021-02-14 DIAGNOSIS — Z9889 Other specified postprocedural states: Secondary | ICD-10-CM

## 2021-03-01 ENCOUNTER — Ambulatory Visit
Admission: RE | Admit: 2021-03-01 | Discharge: 2021-03-01 | Disposition: A | Discharge status: Home / Self Care | Payer: Medicare Other | Source: Ambulatory Visit | Attending: Specialist | Admitting: Specialist

## 2021-03-01 DIAGNOSIS — M25531 Pain in right wrist: Secondary | ICD-10-CM

## 2021-03-01 DIAGNOSIS — S62001A Unspecified fracture of navicular [scaphoid] bone of right wrist, initial encounter for closed fracture: Secondary | ICD-10-CM

## 2021-03-02 ENCOUNTER — Encounter: Payer: Self-pay | Admitting: Specialist

## 2021-03-02 ENCOUNTER — Ambulatory Visit (INDEPENDENT_AMBULATORY_CARE_PROVIDER_SITE_OTHER): Payer: Medicare Other | Admitting: Specialist

## 2021-03-02 ENCOUNTER — Other Ambulatory Visit: Payer: Self-pay

## 2021-03-02 VITALS — BP 185/90 | HR 58 | Ht 63.25 in | Wt 146.5 lb

## 2021-03-02 DIAGNOSIS — M1711 Unilateral primary osteoarthritis, right knee: Secondary | ICD-10-CM

## 2021-03-02 DIAGNOSIS — M1712 Unilateral primary osteoarthritis, left knee: Secondary | ICD-10-CM

## 2021-03-02 DIAGNOSIS — M19031 Primary osteoarthritis, right wrist: Secondary | ICD-10-CM | POA: Diagnosis not present

## 2021-03-02 DIAGNOSIS — M4316 Spondylolisthesis, lumbar region: Secondary | ICD-10-CM

## 2021-03-02 DIAGNOSIS — M4156 Other secondary scoliosis, lumbar region: Secondary | ICD-10-CM

## 2021-03-02 NOTE — Patient Instructions (Signed)
Plan: Use splint with heavy work and gardening. Voltaren gel 3-4 x per day for 2 weeks right wrist. Avoid bending, stooping and avoid lifting weights greater than 10 lbs. Avoid prolong standing and walking. Avoid frequent bending and stooping  No lifting greater than 10 lbs. May use ice or moist heat for pain. Weight loss is of benefit. Handicap license is approved. Knee is suffering from osteoarthritis, only real proven treatments are Weight loss, NSIADs like tylenol and exercise. Well padded shoes help. Ice the knee that is suffering from osteoarthritis, only real proven treatments are Arthritis meds like motrin and alleve can compete with the good affects of aspirin and you should avoid them  Well padded shoes help. Ice the knee 2-3 times a day 15-20 mins at a time.-3 times a day 15-20 mins at a time. Hot showers in the AM.  Injection with steroid may be of benefit. Hemp CBD capsules, amazon.com 5,000-7,000 mg per bottle, 60 capsules per bottle, take one capsule twice a day. Cane in the same hand to use with the painful leg weight bearing.

## 2021-03-02 NOTE — Progress Notes (Signed)
Office Visit Note   Patient: Heather [name]           Date of Birth: Sep 11, 1945           MRN: [id number] Visit Date: 03/02/2021              Requested by: Eden Emms, NP [address] [place],  [place] [address] PCP: Eden Emms, NP   Assessment & Plan: Visit Diagnoses:  1. Spondylolisthesis, lumbar region   2. Other secondary scoliosis, lumbar region   3. Arthritis of right wrist   4. Unilateral primary osteoarthritis, right knee   5. Unilateral primary osteoarthritis, left knee     Plan: Use splint with heavy work and gardening. Voltaren gel 3-4 x per day for 2 weeks right wrist. Avoid bending, stooping and avoid lifting weights greater than 10 lbs. Avoid prolong standing and walking. Avoid frequent bending and stooping  No lifting greater than 10 lbs. May use ice or moist heat for pain. Weight loss is of benefit. Handicap license is approved. Knee is suffering from osteoarthritis, only real proven treatments are Weight loss, NSIADs like tylenol and exercise. Well padded shoes help. Ice the knee that is suffering from osteoarthritis, only real proven treatments are Arthritis meds like motrin and alleve can compete with the good affects of aspirin and you should avoid them  Well padded shoes help. Ice the knee 2-3 times a day 15-20 mins at a time.-3 times a day 15-20 mins at a time. Hot showers in the AM.  Injection with steroid may be of benefit. Hemp CBD capsules, amazon.com 5,000-7,000 mg per bottle, 60 capsules per bottle, take one capsule twice a day. Cane in the same hand to use with the painful leg weight bearing.  Follow-Up Instructions: No follow-ups on file.   Orders:  No orders of the defined types were placed in this encounter.  No orders of the defined types were placed in this encounter.     Procedures: No procedures performed   Clinical Data: Findings:  Study Result  Narrative & Impression CLINICAL DATA:  Wrist pain following  injury in September 2022. Wrist fracture suspected.   EXAM: CT OF THE RIGHT WRIST WITHOUT CONTRAST   TECHNIQUE: Multidetector CT imaging of the right wrist was performed according to the standard protocol. Multiplanar CT image reconstructions were also generated.   RADIATION DOSE REDUCTION: This exam was performed according to the departmental dose-optimization program which includes automated exposure control, adjustment of the mA and/or kV according to patient size and/or use of iterative reconstruction technique.   COMPARISON:  Office radiographs 02/07/2021 and 12/27/2020.   FINDINGS: Bones/Joint/Cartilage   No evidence of acute fracture, dislocation or avascular necrosis. As seen on prior radiographs, there are moderate degenerative changes at the scaphotrapeziotrapezoidal articulation and additional scattered lesser degenerative changes. There is diffuse chondrocalcinosis with involvement of the triangular fibrocartilage. No large joint effusions are evident.   Ligaments   Suboptimally assessed by CT.   Muscles and Tendons   The extensor pollicis brevis and abductor pollicis longus tendons demonstrate enlargement and moderate sheath fluid consistent with de Quervain tenosynovitis. The additional wrist tendons and muscles appear unremarkable.   Soft tissues   No other soft tissue abnormalities are identified.   IMPRESSION: 1. Findings suspicious for de Quervain tenosynovitis. Correlate clinically. 2. No evidence of acute fracture, dislocation or avascular necrosis. 3. Degenerative changes and multifocal chondrocalcinosis as described.     Electronically Signed   By: Chrissie Noa  Purcell Mouton M.D.   On: 03/02/2021 09:05       Subjective: Chief Complaint  Patient presents with   Right Wrist - Follow-up    CT scan review    76 year old female with history of lumbar spinal stenosis and vascular disease. She has been treated conservatively for lumbar spinal  stenosis and spondylosis and is post op laminectomy   Review of Systems  Constitutional: Negative.   HENT: Negative.    Eyes: Negative.   Respiratory: Negative.    Cardiovascular: Negative.   Gastrointestinal: Negative.   Endocrine: Negative.   Genitourinary: Negative.   Musculoskeletal: Negative.   Skin: Negative.   Allergic/Immunologic: Negative.   Neurological: Negative.   Hematological: Negative.   Psychiatric/Behavioral: Negative.      Objective: Vital Signs: BP (!) 185/90 (BP Location: Left Arm, Patient Position: Sitting)    Pulse (!) 58    Ht 5' 3.25" (1.607 m)    Wt 146 lb 8 oz (66.5 kg)    LMP 01/08/2012    BMI 25.75 kg/m   Physical Exam Constitutional:      Appearance: She is well-developed.  HENT:     Head: Normocephalic and atraumatic.  Eyes:     Pupils: Pupils are equal, round, and reactive to light.  Pulmonary:     Effort: Pulmonary effort is normal.     Breath sounds: Normal breath sounds.  Abdominal:     General: Bowel sounds are normal.     Palpations: Abdomen is soft.  Musculoskeletal:     Cervical back: Normal range of motion and neck supple.     Right knee:     Instability Tests: Medial McMurray test positive.     Left knee:     Instability Tests: Medial McMurray test positive.  Skin:    General: Skin is warm and dry.  Neurological:     Mental Status: She is [name] and oriented to person, place, and time.  Psychiatric:        Behavior: Behavior normal.        Thought Content: Thought content normal.        Judgment: Judgment normal.   Right Knee Exam   Range of Motion  Extension:  -5 abnormal  Flexion:  130 abnormal   Tests  McMurray:  Medial - positive  Drawer:  Anterior - negative    Posterior - negative Patellar apprehension: negative   Left Knee Exam   Tenderness  The patient is experiencing tenderness in the lateral joint line.  Range of Motion  Extension:  -5 abnormal  Flexion:  120 abnormal   Tests  McMurray:  Medial  - positive  Lachman:  Anterior - negative    Posterior - negative Drawer:  Anterior - negative     Posterior - negative  Other  Erythema: present Scars: present Swelling: none  Comments:  Varus deformity    Specialty Comments:  No specialty comments available.  Imaging: CT WRIST RIGHT WO CONTRAST  Result Date: 03/02/2021 CLINICAL DATA:  Wrist pain following injury in September 2022. Wrist fracture suspected. EXAM: CT OF THE RIGHT WRIST WITHOUT CONTRAST TECHNIQUE: Multidetector CT imaging of the right wrist was performed according to the standard protocol. Multiplanar CT image reconstructions were also generated. RADIATION DOSE REDUCTION: This exam was performed according to the departmental dose-optimization program which includes automated exposure control, adjustment of the mA and/or kV according to patient size and/or use of iterative reconstruction technique. COMPARISON:  Office radiographs 02/07/2021 and 12/27/2020. FINDINGS: Bones/Joint/Cartilage  No evidence of acute fracture, dislocation or avascular necrosis. As seen on prior radiographs, there are moderate degenerative changes at the scaphotrapeziotrapezoidal articulation and additional scattered lesser degenerative changes. There is diffuse chondrocalcinosis with involvement of the triangular fibrocartilage. No large joint effusions are evident. Ligaments Suboptimally assessed by CT. Muscles and Tendons The extensor pollicis brevis and abductor pollicis longus tendons demonstrate enlargement and moderate sheath fluid consistent with de Quervain tenosynovitis. The additional wrist tendons and muscles appear unremarkable. Soft tissues No other soft tissue abnormalities are identified. IMPRESSION: 1. Findings suspicious for de Quervain tenosynovitis. Correlate clinically. 2. No evidence of acute fracture, dislocation or avascular necrosis. 3. Degenerative changes and multifocal chondrocalcinosis as described. Electronically Signed   By:  Carey BullocksWilliam  Veazey M.D.   On: 03/02/2021 09:05     PMFS History: Patient Active Problem List   Diagnosis Date Noted   Non-recurrent unilateral inguinal hernia without obstruction or gangrene 09/06/2020   Mass of left forearm 09/06/2020   OAB (overactive bladder) 11/18/2017   Hypertension 12/17/2016   Lumbar stenosis with neurogenic claudication 07/03/2016   Carotid disease, bilateral (HCC) 03/03/2015   Seasonal allergies 03/03/2014   Genital herpes 03/03/2014   Hyperlipidemia 03/03/2013   Anxiety and depression 12/11/2011   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   Orogenital transmission (husband had cold sore; HSV1 culture positive)   Shortness of breath dyspnea    WITH EXERTION     Spinal stenosis of lumbar region    Stroke [facility](HCC) 04/2015   "light stroke; sometimes my thinking is slow since" (12/17/2016)   Wears glasses     Family History  Adopted: Yes  Problem Relation Age of Onset   Osteoporosis Mother    Heart disease Mother    Hypertension Mother    Hyperlipidemia Mother    Stroke Mother    Arthritis Mother    Diabetes Father    Heart attack Father    Cancer Sister        breast   Colon cancer Neg Hx     Past Surgical History:  Procedure Laterality Date   CARPAL TUNNEL RELEASE Right 03/31/2013   Procedure: RIGHT CARPAL TUNNEL RELEASE;  Surgeon: Nicki ReaperGary R Kuzma, MD;  Location: [facility];  Service: Orthopedics;  Laterality: Right;    ENDARTERECTOMY Right 05/03/2015   Procedure: RIGHT CAROTID ENDARTERECTOMY ;  Surgeon: Fransisco HertzBrian L Chen, MD;  [facility];  Service: Vascular;  Laterality: Right;   FEMUR FRACTURE SURGERY Right 1975   "hip"   FRACTURE SURGERY     LUMBAR LAMINECTOMY N/A 05/26/2017   Procedure: BILATERAL PARTIAL HEMILAMINECTOMIES L3-4 AND L4-5;  Surgeon: Kerrin ChampagneNitka, Johnnisha Forton E, MD;  Location: [facility] OR;  Service: Orthopedics;  Laterality: N/A;   NASAL SINUS SURGERY  1975   PLANTAR FASCIA RELEASE Bilateral 1968   SHOULDER OPEN ROTATOR CUFF  REPAIR Right 1998   TONSILLECTOMY  1951   TUBAL LIGATION  1995   Social History   Occupational History   Occupation: Retired  Tobacco Use   Smoking status: Never   Smokeless tobacco: Never  Vaping Use   Vaping Use: Never used  Substance and Sexual Activity   Alcohol use: Not Currently    Alcohol/week: 3.0 standard drinks    Types: 3 Cans of beer per week   Drug use: No   Sexual activity: Yes

## 2021-03-06 ENCOUNTER — Other Ambulatory Visit: Payer: Self-pay | Admitting: Specialist

## 2021-03-06 DIAGNOSIS — Z9889 Other specified postprocedural states: Secondary | ICD-10-CM

## 2021-03-08 ENCOUNTER — Other Ambulatory Visit: Payer: Self-pay | Admitting: Nurse Practitioner

## 2021-03-08 DIAGNOSIS — A6004 Herpesviral vulvovaginitis: Secondary | ICD-10-CM

## 2021-03-08 MED ORDER — VALACYCLOVIR HCL 500 MG PO TABS: 500.0000 mg | ORAL_TABLET | Freq: Every day | ORAL | 0 refills | Status: DC

## 2021-03-08 NOTE — Addendum Note (Signed)
Addended by: Consuella Lose on: 03/08/2021 10:36 AM   Modules accepted: Orders

## 2021-03-08 NOTE — Telephone Encounter (Signed)
Patient advised. Appt made for AWV for 04/05/21

## 2021-03-08 NOTE — Addendum Note (Signed)
Addended by: Eden Emms on: 03/08/2021 10:40 AM   Modules accepted: Orders

## 2021-03-08 NOTE — Telephone Encounter (Signed)
Patient needs to have a physical scheduled as she will need labs. I will go ahead and refill but she needs to schedule up

## 2021-03-08 NOTE — Telephone Encounter (Signed)
Pt needs a refill on valACYclovir (VALTREX) 500 MG tablet sent to [facility]. She is only wanting a 30day supply because of the cost

## 2021-03-13 ENCOUNTER — Telehealth: Payer: Self-pay

## 2021-03-13 ENCOUNTER — Encounter: Payer: Self-pay | Admitting: Emergency Medicine

## 2021-03-13 ENCOUNTER — Ambulatory Visit
Admission: EM | Admit: 2021-03-13 | Discharge: 2021-03-13 | Disposition: A | Discharge status: Home / Self Care | Payer: Medicare Other | Attending: Urgent Care | Admitting: Urgent Care

## 2021-03-13 ENCOUNTER — Other Ambulatory Visit: Payer: Self-pay

## 2021-03-13 DIAGNOSIS — J3089 Other allergic rhinitis: Secondary | ICD-10-CM

## 2021-03-13 DIAGNOSIS — I10 Essential (primary) hypertension: Secondary | ICD-10-CM

## 2021-03-13 DIAGNOSIS — R052 Subacute cough: Secondary | ICD-10-CM | POA: Diagnosis not present

## 2021-03-13 DIAGNOSIS — J3489 Other specified disorders of nose and nasal sinuses: Secondary | ICD-10-CM | POA: Diagnosis not present

## 2021-03-13 DIAGNOSIS — B349 Viral infection, unspecified: Secondary | ICD-10-CM

## 2021-03-13 MED ORDER — HYDROCHLOROTHIAZIDE 12.5 MG PO TABS: 12.5000 mg | ORAL_TABLET | Freq: Every day | ORAL | 0 refills | Status: DC

## 2021-03-13 MED ORDER — LEVOCETIRIZINE DIHYDROCHLORIDE 5 MG PO TABS: 5.0000 mg | ORAL_TABLET | Freq: Every evening | ORAL | 0 refills | Status: DC

## 2021-03-13 MED ORDER — ACETAMINOPHEN 325 MG PO TABS: 650.0000 mg | ORAL_TABLET | Freq: Four times a day (QID) | ORAL | 0 refills | Status: AC | PRN

## 2021-03-13 MED ORDER — MONTELUKAST SODIUM 10 MG PO TABS: 10.0000 mg | ORAL_TABLET | Freq: Every day | ORAL | 0 refills | Status: DC

## 2021-03-13 MED ORDER — PREDNISONE 20 MG PO TABS: ORAL_TABLET | ORAL | 0 refills | Status: DC

## 2021-03-13 NOTE — Discharge Instructions (Addendum)
Make sure you follow up with your PCP for your blood pressure and in the meantime, start hydrochlorothiazide to help with this.  Use the steroid for your allergies. It's just 5 days worth. Stop loratadine and start Xyzal instead.

## 2021-03-13 NOTE — Telephone Encounter (Signed)
[facility] Day - Client TELEPHONE ADVICE RECORD AccessNurse Patient Name: Heather [name] Gender: Female DOB: 1945-12-18 Age: 76 Y 2 M 1 D Return Phone Number: [phone] (Primary), [phone] (Secondary) Address[place] [address] Client [facility] Day - Client Client Site [facility] - Day Provider Audria Nine- NP Contact Type Call Who Is Calling Patient / Member / Family / Caregiver Call Type Triage / Clinical Relationship To Patient Self Return Phone Number [phone] (Secondary) Chief Complaint Anxiety and Panic Attack Reason for Call Symptomatic / Request for Health Information Initial Comment Caller states she has a sinus infection with yellow discharge and anxiety attack. Translation No Nurse Assessment Nurse: Stefano Gaul, RN, Dwana Curd Date/Time (Eastern Time): 03/13/2021 8:30:38 AM Confirm and document reason for call. If symptomatic, describe symptoms. ---Caller states she thinks she has sinus infection. Has a lot of nasal congestion that is yellow. her heart was racing yesterday and Sunday. Does the patient have any new or worsening symptoms? ---Yes Will a triage be completed? ---Yes Related visit to physician within the last 2 weeks? ---No Does the PT have any chronic conditions? (i.e. diabetes, asthma, this includes High risk factors for pregnancy, etc.) ---No Is this a behavioral health or substance abuse call? ---No Guidelines Guideline Title Affirmed Question Affirmed Notes Nurse Date/Time (Eastern Time) Heart Rate and Heartbeat Questions Age > 60 years (Exception: brief heartbeat symptoms that went away and now feels well) Stefano Gaul, Charity fundraiser, Dwana Curd 03/13/2021 8:35:00 AM Disp. Time Lamount Cohen Time) Disposition Final User 03/13/2021 8:44:55 AM See HCP within 4 Hours (or PCP triage) Yes Stefano Gaul, RN, Dwana Curd PLEASE NOTE: All timestamps contained within this report are  represented as [place] Standard Time. CONFIDENTIALTY NOTICE: This fax transmission is intended only for the addressee. It contains information that is legally privileged, confidential or otherwise protected from use or disclosure. If you are not the intended recipient, you are strictly prohibited from reviewing, disclosing, copying using or disseminating any of this information or taking any action in reliance on or regarding this information. If you have received this fax in error, please notify us immediately by telephone so that we can arrange for its return to [place]. Phone: [phone], Toll-Free: [phone], Fax: [phone] Page: 2 of 2 Call Id: [id number] Caller Disagree/Comply Comply Caller Understands Yes PreDisposition Call Doctor Care Advice Given Per Guideline SEE HCP (OR PCP TRIAGE) WITHIN 4 HOURS: * IF OFFICE WILL BE OPEN: You need to be seen within the next 3 or 4 hours. Call your doctor (or NP/PA) now or as soon as the office opens. CALL BACK IF: * You become worse CARE ADVICE given per Heart Rate and Heartbeat Questions (Adult) guideline. Comments User: Art Buff, RN Date/Time Lamount Cohen Time): 03/13/2021 8:44:39 AM Called back line at office and spoke to [place] and gave report that pt had racing heart yesterday and sunday. Has thick yellow nasal discharge. Triage outcome see physician within 4 hrs. No appts available. Advised pt to go to urgent care. states she will. Referrals GO TO FACILITY UNDECIDE

## 2021-03-13 NOTE — Telephone Encounter (Signed)
Noted. See the urgent care noted closed in the system

## 2021-03-13 NOTE — ED Triage Notes (Signed)
Pt presents with sinus pressure/pain, runny nose, cough x 5 days

## 2021-03-13 NOTE — ED Provider Notes (Signed)
Heather [name]   MRN: [id number] DOB: 1945/10/30  Subjective:   Heather [name] is a 76 y.o. female presenting for 5-day history of acute onset recurrent sinus pressure, sinus headaches, runny nose, coughing.  Patient has had elevated blood pressure readings but has never been prescribed blood pressure medications in the past. No smoking. No history of asthma, respiratory disorders. Has a history of allergic rhinitis, takes loratadine for this.  No chest pain, shortness of breath, wheezing, confusion, weakness, numbness or tingling on one side of the body.  She does have a history of lumbar stenosis with neurogenic claudication and is on chronic pain medications for this.  No history of stroke, heart disease.  No current facility-administered medications for this encounter.  Current Outpatient Medications:    Ascorbic Acid (VITAMIN C) 1000 MG tablet, Take 1,000 mg by mouth daily., Disp: , Rfl:    aspirin EC 81 MG tablet, Take 1 tablet (81 mg total) by mouth daily., Disp: , Rfl:    atorvastatin (LIPITOR) 40 MG tablet, TAKE 1 TABLET BY MOUTH DAILY AT 6 PM., Disp: 90 tablet, Rfl: 0   busPIRone (BUSPAR) 7.5 MG tablet, TAKE 1 TABLET BY MOUTH 2 TIMES DAILY., Disp: 180 tablet, Rfl: 2   Cholecalciferol (VITAMIN D3) 1000 UNITS CAPS, Take 1,000 Units by mouth daily. , Disp: , Rfl:    gabapentin (NEURONTIN) 300 MG capsule, TAKE 1 CAPSULE BY MOUTH TWICE A DAY, Disp: 180 capsule, Rfl: 4   lidocaine (XYLOCAINE) 2 % jelly, Apply 1 application topically as needed. Apply to the skin over the knees and hips up to BID, Disp: 30 mL, Rfl: 6   loratadine (CLARITIN) 10 MG tablet, Take 10 mg by mouth daily., Disp: , Rfl:    methocarbamol (ROBAXIN) 500 MG tablet, TAKE 1 TABLET BY MOUTH EVERY 8 TO 12 HOURS AS NEEDED FOR MUSCLE SPASMS, Disp: 40 tablet, Rfl: 3   metoprolol tartrate (LOPRESSOR) 25 MG tablet, Take 1 tablet (25 mg total) by mouth 2 (two) times daily., Disp: 180 tablet, Rfl: 3   Multiple Vitamin  (MULTIVITAMIN) tablet, Take 1 tablet by mouth daily., Disp: , Rfl:    sertraline (ZOLOFT) 100 MG tablet, TAKE 1 AND 1/2 TABLETS BY MOUTH DAILY, Disp: 135 tablet, Rfl: 0   traMADol-acetaminophen (ULTRACET) 37.5-325 MG tablet, TAKE 1 TABLET BY MOUTH EVERY 12 HOURS AS NEEDED FOR MODERATE PAIN., Disp: 40 tablet, Rfl: 0   valACYclovir (VALTREX) 500 MG tablet, Take 1 tablet (500 mg total) by mouth daily., Disp: 30 tablet, Rfl: 0   Allergies  Allergen Reactions   Sulfamethoxazole Swelling and Other (See Comments)    SWELLING REACTION UNSPECIFIED     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   Orogenital transmission (husband had cold sore; HSV1 culture positive)   Shortness of breath dyspnea    WITH EXERTION  Spinal stenosis of lumbar region    Stroke [facility]) 04/2015   "light stroke; sometimes my thinking is slow since" (12/17/2016)   Wears glasses      Past Surgical History:  Procedure Laterality Date   CARPAL TUNNEL RELEASE Right 03/31/2013   Procedure: RIGHT CARPAL TUNNEL RELEASE;  Surgeon: Nicki Reaper, MD;  Location: [facility];  Service: Orthopedics;  Laterality: Right;   ENDARTERECTOMY Right 05/03/2015   Procedure: RIGHT CAROTID ENDARTERECTOMY ;  Surgeon: Fransisco Hertz, MD;  Location[facility] OR;  Service: Vascular;  Laterality: Right;   FEMUR FRACTURE SURGERY Right 1975   "hip"   FRACTURE SURGERY     LUMBAR LAMINECTOMY N/A 05/26/2017   Procedure: BILATERAL  PARTIAL HEMILAMINECTOMIES L3-4 AND L4-5;  Surgeon: Kerrin Champagne, MD;  Location: [facility] OR;  Service: Orthopedics;  Laterality: N/A;   NASAL SINUS SURGERY  1975   PLANTAR FASCIA RELEASE Bilateral 1968   SHOULDER OPEN ROTATOR CUFF REPAIR Right 1998   TONSILLECTOMY  1951   TUBAL LIGATION  1995    Family History  Adopted: Yes  Problem Relation Age of Onset   Osteoporosis Mother    Heart disease Mother    Hypertension Mother    Hyperlipidemia Mother    Stroke Mother    Arthritis Mother    Diabetes Father    Heart attack Father    Cancer Sister        breast   Colon cancer Neg Hx     Social History   Tobacco Use   Smoking status: Never   Smokeless tobacco: Never  Vaping Use   Vaping Use: Never used  Substance Use Topics   Alcohol use: Not Currently    Alcohol/week: 3.0 standard drinks    Types: 3 Cans of beer per week   Drug use: No    ROS   Objective:   Vitals: BP (!) 184/82 (BP Location: Right Arm)    Pulse 99    Temp 98.4 F (36.9 C) (Oral)    Resp 18    LMP 01/08/2012    SpO2 98%   BP 175/86 on recheck.  BP Readings from Last 3 Encounters:  03/13/21 (!) 184/82  03/02/21 (!) 185/90  02/07/21 (!) 196/64    Physical Exam Constitutional:      General: She is not in acute distress.    Appearance: Normal appearance. She is well-developed and normal weight. She is not ill-appearing, toxic-appearing or diaphoretic.  HENT:     Head: Normocephalic and atraumatic.     Right Ear: Tympanic membrane, ear canal and external ear normal. No drainage or tenderness. No middle ear effusion. There is no impacted cerumen. Tympanic membrane is not erythematous.     Left Ear: Tympanic membrane, ear canal and external ear normal. No drainage or tenderness.  No middle ear effusion. There is no impacted cerumen. Tympanic membrane is not erythematous.     Nose: Congestion and rhinorrhea present.     Mouth/Throat:     Mouth: Mucous membranes are moist. No oral lesions.     Pharynx:  No pharyngeal swelling, oropharyngeal exudate, posterior oropharyngeal erythema or uvula swelling.     Tonsils: No tonsillar exudate or tonsillar abscesses.  Eyes:     General: No scleral icterus.       Right eye: No discharge.        Left eye: No discharge.     Extraocular Movements: Extraocular movements intact.     Right eye: Normal extraocular  motion.     Left eye: Normal extraocular motion.     Conjunctiva/sclera: Conjunctivae normal.  Cardiovascular:     Rate and Rhythm: Normal rate.     Heart sounds: No murmur heard.   No friction rub. No gallop.  Pulmonary:     Effort: Pulmonary effort is normal. No respiratory distress.     Breath sounds: No stridor. No wheezing, rhonchi or rales.  Chest:     Chest wall: No tenderness.  Musculoskeletal:     Cervical back: Normal range of motion and neck supple.  Lymphadenopathy:     Cervical: No cervical adenopathy.  Skin:    General: Skin is warm and dry.  Neurological:     General: No focal deficit present.     Mental Status: She is alert and oriented to person, place, and time.     Cranial Nerves: No cranial nerve deficit.     Motor: No weakness.     Coordination: Coordination normal.     Gait: Gait normal.     Comments: Negative Romberg and pronator drift.   Psychiatric:        Mood and Affect: Mood normal.        Behavior: Behavior normal.        Thought Content: Thought content normal.        Judgment: Judgment normal.    Assessment and Plan :   PDMP not reviewed this encounter.  1. Acute viral syndrome   2. Stuffy and runny nose   3. Subacute cough   4. Allergic rhinitis due to other allergic trigger, unspecified seasonality   5. Essential hypertension    No suspicion for an acute encephalopathy, stroke.  Suspect patient has a viral syndrome likely made worse by her severe allergic rhinitis.  Recommended starting Xyzal and Singulair daily for this.  For now, will use an oral prednisone course to help her with her  sinus inflammation.  Use supportive care otherwise including Tylenol.  Patient declined any respiratory testing. Deferred imaging given clear cardiopulmonary exam, hemodynamically stable vital signs.  I will start the patient on hydrochlorothiazide to help her with her blood pressure.  Recommended very close follow-up with her PCP for recheck on this.  Counseled patient on potential for adverse effects with medications prescribed/recommended today, ER and return-to-clinic precautions discussed, patient verbalized understanding.    Wallis Bamberg, PA-C 03/13/21 1119

## 2021-03-13 NOTE — Telephone Encounter (Signed)
I spoke with pt and she is presently at [facility] but pt said she has a long wait due to waiting room being full. I checked on .com and [facility] wait time is 7 mnis now. Pt is going to [facility]. Sending note to Audria Nine NP and Anastasiya CMA.

## 2021-03-26 ENCOUNTER — Other Ambulatory Visit: Payer: Self-pay | Admitting: Specialist

## 2021-04-01 ENCOUNTER — Other Ambulatory Visit: Payer: Self-pay | Admitting: Nurse Practitioner

## 2021-04-01 DIAGNOSIS — A6004 Herpesviral vulvovaginitis: Secondary | ICD-10-CM

## 2021-04-03 ENCOUNTER — Other Ambulatory Visit: Payer: Self-pay | Admitting: Nurse Practitioner

## 2021-04-03 DIAGNOSIS — A6004 Herpesviral vulvovaginitis: Secondary | ICD-10-CM

## 2021-04-03 MED ORDER — VALACYCLOVIR HCL 500 MG PO TABS: 500.0000 mg | ORAL_TABLET | Freq: Every day | ORAL | 1 refills | Status: DC

## 2021-04-03 NOTE — Telephone Encounter (Signed)
She is already scheduled for 04/05/21 for her physical ?

## 2021-04-05 ENCOUNTER — Other Ambulatory Visit: Payer: Self-pay

## 2021-04-05 ENCOUNTER — Encounter: Payer: Self-pay | Admitting: Nurse Practitioner

## 2021-04-05 ENCOUNTER — Ambulatory Visit (INDEPENDENT_AMBULATORY_CARE_PROVIDER_SITE_OTHER): Payer: Medicare Other | Admitting: Nurse Practitioner

## 2021-04-05 VITALS — BP 132/62 | HR 53 | Temp 98.3°F | Resp 14 | Ht 63.0 in | Wt 147.0 lb

## 2021-04-05 DIAGNOSIS — I10 Essential (primary) hypertension: Secondary | ICD-10-CM

## 2021-04-05 DIAGNOSIS — Z0000 Encounter for general adult medical examination without abnormal findings: Secondary | ICD-10-CM | POA: Diagnosis not present

## 2021-04-05 DIAGNOSIS — K4090 Unilateral inguinal hernia, without obstruction or gangrene, not specified as recurrent: Secondary | ICD-10-CM

## 2021-04-05 DIAGNOSIS — Z1211 Encounter for screening for malignant neoplasm of colon: Secondary | ICD-10-CM

## 2021-04-05 DIAGNOSIS — Z780 Asymptomatic menopausal state: Secondary | ICD-10-CM

## 2021-04-05 DIAGNOSIS — F419 Anxiety disorder, unspecified: Secondary | ICD-10-CM

## 2021-04-05 DIAGNOSIS — A6004 Herpesviral vulvovaginitis: Secondary | ICD-10-CM

## 2021-04-05 DIAGNOSIS — E782 Mixed hyperlipidemia: Secondary | ICD-10-CM

## 2021-04-05 DIAGNOSIS — R2232 Localized swelling, mass and lump, left upper limb: Secondary | ICD-10-CM

## 2021-04-05 DIAGNOSIS — J302 Other seasonal allergic rhinitis: Secondary | ICD-10-CM

## 2021-04-05 DIAGNOSIS — Z7189 Other specified counseling: Secondary | ICD-10-CM

## 2021-04-05 DIAGNOSIS — F32A Depression, unspecified: Secondary | ICD-10-CM

## 2021-04-05 LAB — COMPREHENSIVE METABOLIC PANEL
ALT: 19 U/L (ref 0–35)
AST: 18 U/L (ref 0–37)
Albumin: 4.2 g/dL (ref 3.5–5.2)
Alkaline Phosphatase: 101 U/L (ref 39–117)
BUN: 16 mg/dL (ref 6–23)
CO2: 32 mEq/L (ref 19–32)
Calcium: 10.4 mg/dL (ref 8.4–10.5)
Chloride: 104 mEq/L (ref 96–112)
Creatinine, Ser: 0.81 mg/dL (ref 0.40–1.20)
GFR: 70.6 mL/min (ref >60.00)
Glucose, Bld: 89 mg/dL (ref 70–99)
Potassium: 4.5 mEq/L (ref 3.5–5.1)
Sodium: 142 mEq/L (ref 135–145)
Total Bilirubin: 0.5 mg/dL (ref 0.2–1.2)
Total Protein: 6.3 g/dL (ref 6.0–8.3)

## 2021-04-05 LAB — CBC
HCT: 37.4 % (ref 36.0–46.0)
Hemoglobin: 12.7 g/dL (ref 12.0–15.0)
MCHC: 34 g/dL (ref 30.0–36.0)
MCV: 94.2 fl (ref 78.0–100.0)
Platelets: 310 10*3/uL (ref 150.0–400.0)
RBC: 3.97 Mil/uL (ref 3.87–5.11)
RDW: 12.8 % (ref 11.5–15.5)
WBC: 7.2 10*3/uL (ref 4.0–10.5)

## 2021-04-05 LAB — LIPID PANEL
Cholesterol: 184 mg/dL (ref 0–200)
HDL: 75.8 mg/dL (ref >39.00)
LDL Cholesterol: 86 mg/dL (ref 0–99)
NonHDL: 108.3
Total CHOL/HDL Ratio: 2
Triglycerides: 110 mg/dL (ref 0.0–149.0)
VLDL: 22 mg/dL (ref 0.0–40.0)

## 2021-04-05 LAB — TSH: TSH: 2.65 u[IU]/mL (ref 0.35–5.50)

## 2021-04-05 NOTE — Assessment & Plan Note (Signed)
Did discuss healthcare power of attorney and living will with patient.  Gave handout from office for her review read and fill out if she wishes. ?

## 2021-04-05 NOTE — Assessment & Plan Note (Signed)
Discussed age-appropriate immunizations and screening exams.  Orders placed for appropriate testing.  Did review Medicare annual wellness paperwork with patient in office, signed it and sent it to be scanned to patient's chart. ?

## 2021-04-05 NOTE — Assessment & Plan Note (Addendum)
PHQ-9 and GAD-7 administered in office.  Currently maintained on BuSpar 7.5 twice daily continue to monitor ?

## 2021-04-05 NOTE — Assessment & Plan Note (Signed)
Continue to monitor.  Did discuss signs symptoms when to seek emergent healthcare regards to incarcerated hernias. ?

## 2021-04-05 NOTE — Assessment & Plan Note (Signed)
Present in the past.  Patient does not want to pursue MRI or further imaging or procedures.  Continue to monitor ?

## 2021-04-05 NOTE — Assessment & Plan Note (Signed)
Since patient is postmenopausal and had many orthopedic issues and surgeries will have her do a DEXA scan to see if she is osteoporotic. ?

## 2021-04-05 NOTE — Assessment & Plan Note (Signed)
Currently maintained on atorvastatin managed by patient's cardiologist continue medication as prescribed follow-up as recommended. ?

## 2021-04-05 NOTE — Patient Instructions (Signed)
Nice to see you today ?I will be in touch with the lab results ?I will see you in 6 months, sooner if you need me ? ?

## 2021-04-05 NOTE — Progress Notes (Signed)
Established Patient Office Visit  Subjective:  Patient ID: Heather [name], female    DOB: 11-Jun-1945  Age: 76 y.o. MRN: [id number]  CC:  Chief Complaint  Patient presents with   Medicare Wellness    HPI [name] presents for Medicare annual wellness visit and for complete physical and follow up of chronic conditions.  Immunizations: -Tetanus: 2012, will need to update at pharmacy  -Influenza: refused -Covid-19: refused -Shingles: discussed will need to update at pharmacy -Pneumonia: UTD  -HPV: Aged out  Diet: [place]. 3 times a day with her meals Exercise: Daily for 30 mins a day  Eye exam: wears glasses. Needs updating. [facility] to [facility] Dr Katy Fitch Dental exam: Completes semi-annually   Pap Smear:  Aged out Mammogram: Does not want to continue Dexa: Unsure. [facility] for order Colonoscopy: Does not want to do. Will order cologuard   Lung Cancer Screening: NA   Dr. Basil Dess: orthopedist Dr. Charisse March: cardiology   HTN: Metoporol, hctz. Has been having high readings in the past. Several high readings in regards to seeing Dr. Louanne Skye. Cardiology manages the hypertension medications. She was seen at urgent care and placed on HCTZ. BP within goal today    Hearing Screening   '500Hz'  '1000Hz'  '2000Hz'  '4000Hz'   Right ear 0 0 25 25  Left ear 0 0 0 0   Vision Screening   Right eye Left eye Both eyes  Without correction     With correction '20/20 20/20 20/20 '  Comments: Saw all colors correct   Past Medical History:  Diagnosis Date   Anxiety    Arthritis    "knees, right hip, lumbar back" (12/17/2016)   Chicken pox    Chronic lower back pain    DDD (degenerative disc disease), lumbar    Depression    Headache 06/23/2015   "2d after carotid OR & again today" (12/17/2016)   Heart murmur    MVP   High cholesterol    History of blood transfusion 1975   "related to femur  fracture" (12/17/2016)   HOH (hard of hearing)    left    Hypertension    MVP (mitral valve prolapse)    echo 1986-mild   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   Orogenital transmission (husband had cold sore; HSV1 culture positive)   Shortness of breath dyspnea    WITH EXERTION     Spinal stenosis of lumbar region    Stroke [facility]) 04/2015   "light stroke; sometimes my thinking is slow since" (12/17/2016)   Wears glasses     Past Surgical History:  Procedure Laterality Date   CARPAL TUNNEL RELEASE Right 03/31/2013   Procedure: RIGHT CARPAL TUNNEL RELEASE;  Surgeon: Wynonia Sours, MD;  Location: [place];  Service: Orthopedics;  Laterality: Right;   ENDARTERECTOMY Right 05/03/2015   Procedure: RIGHT CAROTID ENDARTERECTOMY ;  Surgeon: Conrad Carbonville, MD;  Location: [place];  Service: Vascular;  Laterality: Right;   FEMUR FRACTURE SURGERY Right 1975   "hip"   FRACTURE SURGERY     LUMBAR LAMINECTOMY N/A 05/26/2017   Procedure: BILATERAL PARTIAL HEMILAMINECTOMIES L3-4 AND L4-5;  Surgeon: Jessy Oto, MD;  Location: [place];  Service: Orthopedics;  Laterality: N/A;   NASAL SINUS SURGERY  1975   PLANTAR FASCIA RELEASE Bilateral 1968   SHOULDER OPEN ROTATOR CUFF REPAIR Right [place]    Family History  Adopted: Yes  Problem Relation Age of Onset   Osteoporosis Mother    Heart disease Mother    Hypertension Mother    Hyperlipidemia Mother    Stroke Mother    Arthritis Mother    Diabetes Father    Heart attack Father    Cancer Sister        breast   Colon cancer Neg Hx     Social History   Socioeconomic History   Marital status: Divorced    Spouse name: Not on file   Number of children: 1   Years of education: 12   Highest education level: Not on file  Occupational History   Occupation: Retired  Tobacco Use   Smoking status: Never   Smokeless tobacco: Never  Scientific laboratory technician Use: Never used  Substance and Sexual Activity   Alcohol use: Not Currently     Alcohol/week: 3.0 standard drinks    Types: 3 Cans of beer per week   Drug use: No   Sexual activity: Yes  Other Topics Concern   Not on file  Social History Narrative   Lives at home w/ significant other   Right-handed   Drinks 2 cups caffeine per day   Social Determinants of Health   Financial Resource Strain: Not on file  Food Insecurity: Not on file  Transportation Needs: Not on file  Physical Activity: Not on file  Stress: Not on file  Social Connections: Not on file  Intimate Partner Violence: Not on file    Outpatient Medications Prior to Visit  Medication Sig Dispense Refill   acetaminophen (TYLENOL) 325 MG tablet Take 2 tablets (650 mg total) by mouth every 6 (six) hours as needed for moderate pain. 30 tablet 0   Ascorbic Acid (VITAMIN C) 1000 MG tablet Take 1,000 mg by mouth daily.     aspirin EC 81 MG tablet Take 1 tablet (81 mg total) by mouth daily.     atorvastatin (LIPITOR) 40 MG tablet TAKE 1 TABLET BY MOUTH DAILY AT 6 PM. 90 tablet 0   busPIRone (BUSPAR) 7.5 MG tablet TAKE 1 TABLET BY MOUTH 2 TIMES DAILY. 180 tablet 2   gabapentin (NEURONTIN) 300 MG capsule TAKE 1 CAPSULE BY MOUTH TWICE A DAY 180 capsule 4   hydrochlorothiazide (HYDRODIURIL) 12.5 MG tablet Take 1 tablet (12.5 mg total) by mouth daily. 90 tablet 0   levocetirizine (XYZAL) 5 MG tablet Take 1 tablet (5 mg total) by mouth every evening. 90 tablet 0   lidocaine (XYLOCAINE) 2 % jelly Apply 1 application topically as needed. Apply to the skin over the knees and hips up to BID 30 mL 6   methocarbamol (ROBAXIN) 500 MG tablet TAKE 1 TABLET BY MOUTH EVERY 8 TO 12 HOURS AS NEEDED FOR MUSCLE SPASMS 40 tablet 3   metoprolol tartrate (LOPRESSOR) 25 MG tablet Take 1 tablet (25 mg total) by mouth 2 (two) times daily. 180 tablet 3   traMADol-acetaminophen (ULTRACET) 37.5-325 MG tablet TAKE 1 TABLET BY MOUTH EVERY 12 HOURS AS NEEDED FOR MODERATE PAIN. 40 tablet 0   valACYclovir (VALTREX) 500 MG tablet Take 1 tablet  (500 mg total) by mouth daily. 90 tablet 1   Cholecalciferol (VITAMIN D3) 1000 UNITS CAPS Take 1,000 Units by mouth daily.      loratadine (CLARITIN) 10 MG tablet Take 10 mg by mouth daily.     montelukast (SINGULAIR) 10 MG tablet Take 1 tablet (10 mg total) by mouth at bedtime. 90 tablet  0   Multiple Vitamin (MULTIVITAMIN) tablet Take 1 tablet by mouth daily.     predniSONE (DELTASONE) 20 MG tablet Take 2 tablets daily with breakfast. 10 tablet 0   sertraline (ZOLOFT) 100 MG tablet TAKE 1 AND 1/2 TABLETS BY MOUTH DAILY 135 tablet 0   No facility-administered medications prior to visit.    Allergies  Allergen Reactions   Sulfamethoxazole Swelling and Other (See Comments)    SWELLING REACTION UNSPECIFIED     ROS Review of Systems  Constitutional:  Negative for chills and fever.  Respiratory:  Positive for cough (resovling). Negative for shortness of breath.   Cardiovascular:  Negative for chest pain.  Gastrointestinal:  Negative for abdominal pain, constipation, diarrhea, nausea and vomiting.       BM every 2-3 days  Genitourinary:  Negative for difficulty urinating, dysuria, hematuria and vaginal bleeding.       Nocturia "+" 2  Neurological:  Positive for numbness (intermittent in the toe). Negative for dizziness, light-headedness and headaches.  Psychiatric/Behavioral:  Negative for hallucinations and suicidal ideas.      Objective:    Physical Exam Vitals and nursing note reviewed.  Constitutional:      Appearance: Normal appearance.  HENT:     Right Ear: Tympanic membrane, ear canal and external ear normal.     Left Ear: Tympanic membrane, ear canal and external ear normal.  Cardiovascular:     Rate and Rhythm: Normal rate and regular rhythm.  Pulmonary:     Effort: Pulmonary effort is normal.     Breath sounds: Normal breath sounds.  Abdominal:     General: Bowel sounds are normal. There is no distension.     Tenderness: There is no abdominal tenderness.   Neurological:     Mental Status: She is alert.     Comments: Bilateral upper and lower extremity strength /  Psychiatric:        Mood and Affect: Mood normal.        Behavior: Behavior normal.        Thought Content: Thought content normal.        Judgment: Judgment normal.    BP 132/62    Pulse (!) 53    Temp 98.3 F (36.8 C)    Resp 14    Ht '5\' 3"'  (1.6 m)    Wt 147 lb (66.7 kg)    LMP 01/08/2012    SpO2 99%    BMI 26.04 kg/m  Wt Readings from Last 3 Encounters:  04/05/21 147 lb (66.7 kg)  03/02/21 146 lb 8 oz (66.5 kg)  02/07/21 146 lb 8 oz (66.5 kg)     Health Maintenance Due  Topic Date Due   Zoster Vaccines- Shingrix (1 of 2) Never done   DEXA SCAN  Never done   TETANUS/TDAP  04/19/2020    There are no preventive care reminders to display for this patient.  Lab Results  Component Value Date   TSH 1.81 03/03/2013   Lab Results  Component Value Date   WBC 7.1 10/19/2019   HGB 12.0 10/19/2019   HCT 35.2 (L) 10/19/2019   MCV 96.8 10/19/2019   PLT 263.0 10/19/2019   Lab Results  Component Value Date   NA 141 04/21/2020   K 5.0 04/21/2020   CO2 25 04/21/2020   GLUCOSE 96 04/21/2020   BUN 15 04/21/2020   CREATININE 0.89 04/21/2020   BILITOT 0.4 10/19/2019   ALKPHOS 82 10/19/2019   AST 17 10/19/2019  ALT 18 04/21/2020   PROT 6.3 10/19/2019   ALBUMIN 4.4 10/19/2019   CALCIUM 10.2 04/21/2020   ANIONGAP 6 05/27/2017   EGFR 68 04/21/2020   GFR 59.55 (L) 10/19/2019   Lab Results  Component Value Date   CHOL 170 04/21/2020   Lab Results  Component Value Date   HDL 76 04/21/2020   Lab Results  Component Value Date   LDLCALC 74 04/21/2020   Lab Results  Component Value Date   TRIG 113 04/21/2020   Lab Results  Component Value Date   CHOLHDL 2.2 04/21/2020   Lab Results  Component Value Date   HGBA1C 5.8 02/21/2015      Assessment & Plan:   Problem List Items Addressed This Visit       Cardiovascular and Mediastinum   Hypertension     Currently managed by Dr. Cleatrice Burke, patient's cardiologist.  Patiently currently maintained on metoprolol and hydrochlorothiazide.  Continue medications check CMP today as patient was recently started on HCTZ follow-up with cardiologist as recommended      Relevant Orders   CBC   Comprehensive metabolic panel   TSH     Genitourinary   Genital herpes    Since patient is sexually active she would like to stay on suppressive therapy.  Continue valacyclovir pending lab results today.        Other   Anxiety and depression    PHQ-9 and GAD-7 administered in office.  Currently maintained on BuSpar 7.5 twice daily continue to monitor      Hyperlipidemia    Currently maintained on atorvastatin managed by patient's cardiologist continue medication as prescribed follow-up as recommended.      Relevant Orders   Lipid panel   Seasonal allergies   Non-recurrent unilateral inguinal hernia without obstruction or gangrene    Continue to monitor.  Did discuss signs symptoms when to seek emergent healthcare regards to incarcerated hernias.      Mass of left forearm    Present in the past.  Patient does not want to pursue MRI or further imaging or procedures.  Continue to monitor      Medicare annual wellness visit, subsequent - Primary    Discussed age-appropriate immunizations and screening exams.  Orders placed for appropriate testing.  Did review Medicare annual wellness paperwork with patient in office, signed it and sent it to be scanned to patient's chart.      Relevant Orders   Ambulatory referral to Ophthalmology   Ambulatory referral to Audiology   Post-menopausal    Since patient is postmenopausal and had many orthopedic issues and surgeries will have her do a DEXA scan to see if she is osteoporotic.      Relevant Orders   DG Bone Density   Advance directive discussed with patient    Did discuss healthcare power of attorney and living will with patient.  Gave handout from  office for her review read and fill out if she wishes.      Other Visit Diagnoses     Screening for colon cancer       Relevant Orders   Cologuard       No orders of the defined types were placed in this encounter.   Follow-up: Return in about 6 months (around 10/06/2021) for recheck .    Romilda Garret, NP

## 2021-04-05 NOTE — Assessment & Plan Note (Signed)
Since patient is sexually active she would like to stay on suppressive therapy.  Continue valacyclovir pending lab results today. ?

## 2021-04-05 NOTE — Assessment & Plan Note (Signed)
Currently managed by Dr. Laqueta Carina, patient's cardiologist.  Patiently currently maintained on metoprolol and hydrochlorothiazide.  Continue medications check CMP today as patient was recently started on HCTZ follow-up with cardiologist as recommended ?

## 2021-04-21 ENCOUNTER — Other Ambulatory Visit: Payer: Self-pay | Admitting: Cardiovascular Disease

## 2021-04-30 ENCOUNTER — Ambulatory Visit: Payer: Medicare Other | Admitting: Audiologist

## 2021-05-10 ENCOUNTER — Other Ambulatory Visit: Payer: Self-pay | Admitting: Specialist

## 2021-05-10 DIAGNOSIS — Z9889 Other specified postprocedural states: Secondary | ICD-10-CM

## 2021-05-14 ENCOUNTER — Encounter: Payer: Self-pay | Admitting: Cardiovascular Disease

## 2021-05-14 NOTE — Progress Notes (Signed)
? ?Cardiology Office Note ? ? ?Date:  05/15/2021  ? ?ID:  Heather [name], DOB 1945-05-03, MRN [id number] ? ?PCP:  Eden Emms, NP  ?Cardiologist:   Kristeen Miss, MD  ? ?Chief Complaint  ?Patient presents with  ? Hypertension  ?   ?  ? Hyperlipidemia  ? ? ? Problem List ? ?1. Essential HTN ?2 Carotid artery disease ?3. Hyperlipidemia ? ? ?  ?Feb. 8, 2017 ?Heather [name] is a 76 y.o. female who presents for pre op evaluation prior to having carotid artery endarterectomy ?She has a history of mitral valve prolapse and has previously seen Dr. Aleen Campi. She's not seen a cardiologist for years. She has had some tachypalpitations in the past.  ?She's had some hyperlipidemia. She also has hypertension. ?She does not take her BP at home but thinks todays elevated BP is more due to white coat HTN ? ?She has had some exertional chest tightness -  Last for several minutes ( or until she stops walking )  ?Has been going on for several months  ? ?Eats lots of processed foods and fast foods.    ? ?July 06, 2015: ? 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 ?She received lots of antibiotics over the course of the past couple months then and has developed thrush. ? ?She has a history of hyperlipidemia. We started atorvastatin 40 mg a day. Her last lipid levels look very good. ? ?Dec. 7, 2017: ? ?Heather [name] is doing ok. ?Having back pain .   Was supposed to have back surgery earlier this year - had urgent carotid surgery instead ( and had a CVA as a complication of that )  ?Has some dyspnea.   Thinks its allergies.  ? ?February 05, 2016: ?Heather [name] is seen today for follow-up of a recent emergency room visit.  I saw Heather [name] in 2017 for preoperative carotid surgery.  She presented to the emergency room in November, 2018 with increasing shortness of breath and some chest discomfort. ?Is better from a respiratory / cardiology standpoint .  ? ?Needs to have back surgery.   Waiting on  clearance from Dr. Imogene Burn  ? ?She has a history of bilateral carotid artery disease.  She is status post right carotid endarterectomy in April, 2017. ? ?Echo in Nov. 2018 showed normal LV function  ? ?April 21, 2020: ?Heather [name] is seen today for follow  Up for carotid artery disease. S/p right CEA in April 2017. ?She needs to have knee surgery .  ?BP is elevated today . ?Has not been checking her BP at home ,   Advised her to get a BP  ?Is not avoiding salt .  ?No CP ,  ?Is not exercising much because of knee pain  ? ?Echo in 2018 showed normal LV function  ? ?May 15, 2021 ?Heather [name] is seen for follow up of her carotid artery disease ?S/p R CEA in April 2017 ? ?Has had a URI since Feb was not COVID.    ?While she had the viral infection she had stopped her Lopressor temporarily.  She was having palpitations.  She learned that if she took in a deep breath and held for for 5 seconds that the palpitations would resolve. ?Her URI symptoms were eased with prednisone,    ? ?She has been started on hydrochlorothiazide 12.5 mg a day. ?Eats lots of processed meats  ? ?Echocardiogram from  2018 reveals normal left ventricular function.  She has mild aortic sclerosis. ? ?Encouraged her to get regular exercise  ? ?Past Medical History:  ?Diagnosis Date  ? Anxiety   ? Arthritis   ? "knees, right hip, lumbar back" (12/17/2016)  ? Chicken pox   ? Chronic lower back pain   ? DDD (degenerative disc disease), lumbar   ? Depression   ? Headache 06/23/2015  ? "2d after carotid OR & again today" (12/17/2016)  ? Heart murmur   ? MVP  ? High cholesterol   ? History of blood transfusion 1975  ? "related to femur  fracture" (12/17/2016)  ? HOH (hard of hearing)   ? left  ? Hypertension   ? MVP (mitral valve prolapse)   ? echo 1986-mild  ? 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  ? Orogenital transmission (husband had cold sore; HSV1 culture positive)  ? Shortness of breath dyspnea   ? WITH EXERTION    ? Spinal stenosis of lumbar region   ?  Stroke [facility]) 04/2015  ? "light stroke; sometimes my thinking is slow since" (12/17/2016)  ? Wears glasses   ? ? ?Past Surgical History:  ?Procedure Laterality Date  ? CARPAL TUNNEL RELEASE Right 03/31/2013  ? Procedure: RIGHT CARPAL TUNNEL RELEASE;  Surgeon: Nicki Reaper, MD;  Location: [facility];  Service: Orthopedics;  Laterality: Right;  ? ENDARTERECTOMY Right 05/03/2015  ? Procedure: RIGHT CAROTID ENDARTERECTOMY ;  Surgeon: Fransisco Hertz, MD;  Location: [facility] OR;  Service: Vascular;  Laterality: Right;  ? FEMUR FRACTURE SURGERY Right 1975  ? "hip"  ? FRACTURE SURGERY    ? LUMBAR LAMINECTOMY N/A 05/26/2017  ? Procedure: BILATERAL PARTIAL HEMILAMINECTOMIES L3-4 AND L4-5;  Surgeon: Kerrin Champagne, MD;  Location: [facility] OR;  Service: Orthopedics;  Laterality: N/A;  ? NASAL SINUS SURGERY  1975  ? PLANTAR FASCIA RELEASE Bilateral 1968  ? SHOULDER OPEN ROTATOR CUFF REPAIR Right 1998  ? TONSILLECTOMY  1951  ? TUBAL LIGATION  1995  ? ? ? ?Current Outpatient Medications  ?Medication Sig Dispense Refill  ? acetaminophen (TYLENOL) 325 MG tablet Take 2 tablets (650 mg total) by mouth every 6 (six) hours as needed for moderate pain. 30 tablet 0  ? Ascorbic Acid (VITAMIN C) 1000 MG tablet Take 1,000 mg by mouth daily.    ? aspirin EC 81 MG tablet Take 1 tablet (81 mg total) by mouth daily.    ? atorvastatin (LIPITOR) 40 MG tablet TAKE 1 TABLET BY MOUTH DAILY AT 6 PM. 90 tablet 0  ? busPIRone (BUSPAR) 7.5 MG tablet TAKE 1 TABLET BY MOUTH 2 TIMES DAILY. 180 tablet 2  ? gabapentin (NEURONTIN) 300 MG capsule Take 300 mg by mouth every evening.    ? hydrochlorothiazide (HYDRODIURIL) 12.5 MG tablet Take 1 tablet (12.5 mg total) by mouth daily. 90 tablet 0  ? levocetirizine (XYZAL) 5 MG tablet Take 1 tablet (5 mg total) by mouth every evening. 90 tablet 0  ? lidocaine (XYLOCAINE) 2 % jelly Apply 1 application topically as needed. Apply to the skin over the knees and hips up to BID 30 mL 6  ? methocarbamol (ROBAXIN) 500 MG tablet  TAKE 1 TABLET BY MOUTH EVERY 8 TO 12 HOURS AS NEEDED FOR MUSCLE SPASMS 40 tablet 3  ? metoprolol tartrate (LOPRESSOR) 25 MG tablet TAKE 1 TABLET BY MOUTH TWICE A DAY 60 tablet 0  ? traMADol-acetaminophen (ULTRACET) 37.5-325 MG tablet TAKE 1 TABLET BY MOUTH EVERY 12  HOURS AS NEEDED FOR MODERATE PAIN. 40 tablet 0  ? valACYclovir (VALTREX) 500 MG tablet Take 1 tablet (500 mg total) by mouth daily. 90 tablet 1  ? ?No current facility-administered medications for this visit.  ? ? ?Allergies:   Sulfamethoxazole  ? ? ?Social History:  The patient  reports that she has never smoked. She has never used smokeless tobacco. She reports that she does not currently use alcohol after a past usage of about 3.0 standard drinks per week. She reports that she does not use drugs.  ? ?Family History:  The patient's family history includes Arthritis in her mother; Cancer in her sister; Diabetes in her father; Heart attack in her father; Heart disease in her mother; Hyperlipidemia in her mother; Hypertension in her mother; Osteoporosis in her mother; Stroke in her mother. She was adopted.  ? ? ?ROS: Noted in current history.  Otherwise all systems are negative ? ?Physical Exam: ?Blood pressure (!) 142/76, pulse 63, height 5\' 3"  (1.6 m), weight 148 lb (67.1 kg), last menstrual period 01/08/2012, SpO2 94 %. ? ?GEN:  elderly female , in  no acute distress ?HEENT: Normal ?NECK: No JVD; Right carotid CEA scar, associated with a bruit  ?LYMPHATICS: No lymphadenopathy ?CARDIAC: RRR , sof systolic murmur  ?RESPIRATORY:  Clear to auscultation without rales, wheezing or rhonchi  ?ABDOMEN: Soft, non-tender, non-distended ?MUSCULOSKELETAL:  No edema; No deformity  ?SKIN: Warm and dry ?NEUROLOGIC:  Alert and oriented x 3 ? ? ? ?EKG:   May 15, 2021: Normal sinus rhythm at 63.  Nonspecific ST changes.  No changes from previous EKG. ? ?Recent Labs: ?04/05/2021: ALT 19; BUN 16; Creatinine, Ser 0.81; Hemoglobin 12.7; Platelets 310.0; Potassium 4.5; Sodium  142; TSH 2.65  ? ? ?Lipid Panel ?   ?Component Value Date/Time  ? CHOL 184 04/05/2021 1023  ? CHOL 170 04/21/2020 1011  ? TRIG 110.0 04/05/2021 1023  ? HDL 75.80 04/05/2021 1023  ? HDL 76 04/21/2020 1011  ?

## 2021-05-15 ENCOUNTER — Encounter: Payer: Self-pay | Admitting: Cardiovascular Disease

## 2021-05-15 ENCOUNTER — Ambulatory Visit: Payer: Medicare Other | Admitting: Cardiovascular Disease

## 2021-05-15 VITALS — BP 142/76 | HR 63 | Ht 63.0 in | Wt 148.0 lb

## 2021-05-15 DIAGNOSIS — I10 Essential (primary) hypertension: Secondary | ICD-10-CM | POA: Diagnosis not present

## 2021-05-15 DIAGNOSIS — Z79899 Other long term (current) drug therapy: Secondary | ICD-10-CM | POA: Diagnosis not present

## 2021-05-15 MED ORDER — HYDROCHLOROTHIAZIDE 25 MG PO TABS: 25.0000 mg | ORAL_TABLET | Freq: Every day | ORAL | 3 refills | Status: DC

## 2021-05-15 NOTE — Patient Instructions (Signed)
Medication Instructions:  ?Your physician has recommended you make the following change in your medication:  ? ?1) INCREASE Hydrochlorothiazide (HCTZ) 25mg  daily ? ?*If you need a refill on your cardiac medications before your next appointment, please call your pharmacy* ? ?Lab Work: ?In 2-3 weeks: BMP ?If you have labs (blood work) drawn today and your tests are completely normal, you will receive your results only by: ?MyChart Message (if you have MyChart) OR ?A paper copy in the mail ?If you have any lab test that is abnormal or we need to change your treatment, we will call you to review the results. ? ?Testing/Procedures: ?NONE ? ?Follow-Up: ?At [facility], you and your health needs are our priority.  As part of our continuing mission to provide you with exceptional heart care, we have created designated Provider Care Teams.  These Care Teams include your primary Cardiologist (physician) and Advanced Practice Providers (APPs -  Physician Assistants and Nurse Practitioners) who all work together to provide you with the care you need, when you need it. ? ?Your next appointment:   ?6 month(s) ? ?The format for your next appointment:   ?In Person ? ?Provider:   ?[facility], PA-C    ? ?Other Instructions ?Important Information About Sugar ? ? ? ? ?  ?

## 2021-05-19 ENCOUNTER — Other Ambulatory Visit: Payer: Self-pay | Admitting: Cardiovascular Disease

## 2021-05-24 ENCOUNTER — Other Ambulatory Visit: Payer: Self-pay | Admitting: Nurse Practitioner

## 2021-05-24 ENCOUNTER — Telehealth: Payer: Self-pay | Admitting: Nurse Practitioner

## 2021-05-24 DIAGNOSIS — F32A Depression, unspecified: Secondary | ICD-10-CM

## 2021-05-24 MED ORDER — BUSPIRONE HCL 7.5 MG PO TABS: 7.5000 mg | ORAL_TABLET | Freq: Two times a day (BID) | ORAL | 2 refills | Status: DC

## 2021-05-24 NOTE — Telephone Encounter (Signed)
Encourage patient to contact the pharmacy for refills or they can request refills through [facility] ? ?Did the patient contact the pharmacy:  Pharmacy told pt to call us ? ?LAST APPOINTMENT DATE: 04/05/2021 ? ?NEXT APPOINTMENT DATE: N/A ? ?MEDICATION: busPIRone (BUSPAR) 7.5 MG tablet ? ?Is the patient out of medication? No ? ?If not, how much is left? 20 tablets left ? ?Is this a 90 day supply: 180 tablets  ? ?PHARMACY:  [id number] - [name], [place] - [address] ? ?Phone Number: [phone] ? ?Let patient know to contact pharmacy at the end of the day to make sure medication is ready. ? ?Please notify patient to allow 48-72 hours to process ?  ?

## 2021-05-24 NOTE — Telephone Encounter (Signed)
Patient advised.

## 2021-05-24 NOTE — Telephone Encounter (Signed)
Medication sent in. 

## 2021-05-30 ENCOUNTER — Other Ambulatory Visit: Payer: Self-pay | Admitting: Specialist

## 2021-06-05 ENCOUNTER — Other Ambulatory Visit: Payer: Medicare Other | Admitting: *Deleted

## 2021-06-05 DIAGNOSIS — Z79899 Other long term (current) drug therapy: Secondary | ICD-10-CM

## 2021-06-05 DIAGNOSIS — I10 Essential (primary) hypertension: Secondary | ICD-10-CM

## 2021-06-05 LAB — BASIC METABOLIC PANEL
BUN/Creatinine Ratio: 24 (ref 12–28)
BUN: 20 mg/dL (ref 8–27)
CO2: 25 mmol/L (ref 20–29)
Calcium: 9.7 mg/dL (ref 8.7–10.3)
Chloride: 98 mmol/L (ref 96–106)
Creatinine, Ser: 0.82 mg/dL (ref 0.57–1.00)
Glucose: 95 mg/dL (ref 70–99)
Potassium: 3.9 mmol/L (ref 3.5–5.2)
Sodium: 137 mmol/L (ref 134–144)
eGFR: 74 mL/min/{1.73_m2} (ref >59)

## 2021-06-14 ENCOUNTER — Other Ambulatory Visit: Payer: Self-pay | Admitting: Specialist

## 2021-06-14 DIAGNOSIS — Z9889 Other specified postprocedural states: Secondary | ICD-10-CM

## 2021-06-27 ENCOUNTER — Telehealth: Payer: Self-pay | Admitting: Nurse Practitioner

## 2021-06-27 MED ORDER — LEVOCETIRIZINE DIHYDROCHLORIDE 5 MG PO TABS: 5.0000 mg | ORAL_TABLET | Freq: Every evening | ORAL | 1 refills | Status: DC

## 2021-06-27 NOTE — Telephone Encounter (Signed)
Patient was seen on 03/13/21 at Urgent Care and was given Xyzal 5 mg and Montelukast 10 mg and that helped a lot with her sinuses/allergies. She has ran out of both and would like to get refills on both of those to take regularly. She has noticed her allergies flaring up since not having this medication the last few days. She is in environment with more dust too right now.

## 2021-06-27 NOTE — Telephone Encounter (Signed)
Lets start with the xyzal and she how she does before we add back on the singular

## 2021-06-27 NOTE — Telephone Encounter (Signed)
Pt called and wanted to speak with the nurse about two medications: 1. levocetirizine (XYZAL) 5 MG tablet and another medication for 10 mg, pt did not know how to pronounce/spell it. Please return a call back when possible, thanks.  Callback Number: [phone]

## 2021-06-27 NOTE — Telephone Encounter (Signed)
Patient advised.

## 2021-07-02 ENCOUNTER — Other Ambulatory Visit: Payer: Self-pay | Admitting: Cardiovascular Disease

## 2021-07-12 ENCOUNTER — Other Ambulatory Visit: Payer: Self-pay | Admitting: Specialist

## 2021-07-12 DIAGNOSIS — Z9889 Other specified postprocedural states: Secondary | ICD-10-CM

## 2021-08-02 ENCOUNTER — Other Ambulatory Visit: Payer: Self-pay | Admitting: Specialist

## 2021-08-02 DIAGNOSIS — Z9889 Other specified postprocedural states: Secondary | ICD-10-CM

## 2021-08-28 ENCOUNTER — Other Ambulatory Visit: Payer: Self-pay | Admitting: Specialist

## 2021-08-28 DIAGNOSIS — Z9889 Other specified postprocedural states: Secondary | ICD-10-CM

## 2021-09-17 ENCOUNTER — Other Ambulatory Visit: Payer: Self-pay | Admitting: Specialist

## 2021-09-17 DIAGNOSIS — Z9889 Other specified postprocedural states: Secondary | ICD-10-CM

## 2021-09-26 ENCOUNTER — Other Ambulatory Visit: Payer: Self-pay | Admitting: Cardiovascular Disease

## 2021-10-08 ENCOUNTER — Other Ambulatory Visit: Payer: Self-pay | Admitting: Nurse Practitioner

## 2021-10-08 ENCOUNTER — Other Ambulatory Visit: Payer: Self-pay | Admitting: Specialist

## 2021-10-08 DIAGNOSIS — A6004 Herpesviral vulvovaginitis: Secondary | ICD-10-CM

## 2021-10-08 DIAGNOSIS — Z9889 Other specified postprocedural states: Secondary | ICD-10-CM

## 2021-10-19 ENCOUNTER — Other Ambulatory Visit: Payer: Self-pay | Admitting: Specialist

## 2021-10-19 ENCOUNTER — Other Ambulatory Visit: Payer: Self-pay | Admitting: Nurse Practitioner

## 2021-10-29 ENCOUNTER — Other Ambulatory Visit: Payer: Self-pay | Admitting: Specialist

## 2021-10-29 DIAGNOSIS — Z9889 Other specified postprocedural states: Secondary | ICD-10-CM

## 2021-11-20 ENCOUNTER — Other Ambulatory Visit: Payer: Self-pay | Admitting: Specialist

## 2021-11-20 DIAGNOSIS — Z9889 Other specified postprocedural states: Secondary | ICD-10-CM

## 2021-11-23 ENCOUNTER — Other Ambulatory Visit: Payer: Self-pay | Admitting: Specialist

## 2021-11-23 NOTE — Telephone Encounter (Signed)
Patient need refill on medication Tramadol [facility]. Please call patient to confirm [phone]

## 2021-11-27 ENCOUNTER — Other Ambulatory Visit: Payer: Self-pay | Admitting: Specialist

## 2021-12-16 ENCOUNTER — Other Ambulatory Visit: Payer: Self-pay | Admitting: Nurse Practitioner

## 2021-12-16 DIAGNOSIS — F419 Anxiety disorder, unspecified: Secondary | ICD-10-CM

## 2021-12-17 NOTE — Telephone Encounter (Signed)
Patient scheduled.

## 2021-12-17 NOTE — Telephone Encounter (Signed)
Please schedule follow up anytime now. Thank you

## 2021-12-18 ENCOUNTER — Other Ambulatory Visit: Payer: Self-pay | Admitting: Orthopaedic Surgery

## 2021-12-18 ENCOUNTER — Other Ambulatory Visit: Payer: Self-pay | Admitting: Radiology

## 2021-12-18 NOTE — Telephone Encounter (Signed)
I called and advised patient of Dr. Ophelia Charter message. She asked for an appt with him, I advised that I cannot guarantee that he will rx her the meds, she states that she is not able to have surgery as she does not do well with it. I did try to advise her that if that is the case that she needs to go to pain management since she is not a surgical candidate but she wanted to be seen in the office

## 2021-12-25 ENCOUNTER — Ambulatory Visit: Payer: Medicare Other | Admitting: Orthopaedic Surgery

## 2021-12-25 ENCOUNTER — Encounter: Payer: Self-pay | Admitting: Orthopaedic Surgery

## 2021-12-25 VITALS — BP 194/82 | HR 85 | Ht 63.0 in | Wt 153.0 lb

## 2021-12-25 DIAGNOSIS — M48062 Spinal stenosis, lumbar region with neurogenic claudication: Secondary | ICD-10-CM | POA: Diagnosis not present

## 2021-12-25 MED ORDER — TRAMADOL-ACETAMINOPHEN 37.5-325 MG PO TABS: 1.0000 | ORAL_TABLET | Freq: Two times a day (BID) | ORAL | 0 refills | Status: DC | PRN

## 2021-12-25 NOTE — Progress Notes (Signed)
Office Visit Note   Patient: Heather [name]           Date of Birth: May 12, 1945           MRN: [id number] Visit Date: 12/25/2021              Requested by: Michela Pitcher, NP [address] [place],  [place] [id number] PCP: Michela Pitcher, NP   Assessment & Plan: Visit Diagnoses:  1. Lumbar stenosis with neurogenic claudication     Plan: I refilled her medication Heather [name] can talk with her PCP.  Heather [name] has been on a stable dose for 2 years has not increased the dosage does not appear to abuse the medicine.  Heather [name] was last seen by Dr. Louanne Skye back in February and today is the first visit to have seen her.  Heather [name] is not interested in any back surgery.  Epidural injections have not helped.  If her knees get worse Heather [name] can return to see me for knee x-rays.  Heather [name] can talk to her PCP see if they be willing to refill her medication since currently Heather [name] is not actively being followed by [place].  Follow-Up Instructions: No follow-ups on file.   Orders:  No orders of the defined types were placed in this encounter.  Meds ordered this encounter  Medications   traMADol-acetaminophen (ULTRACET) 37.5-325 MG tablet    Sig: Take 1 tablet by mouth every 12 (twelve) hours as needed.    Dispense:  40 tablet    Refill:  0      Procedures: No procedures performed   Clinical Data: No additional findings.   Subjective: Chief Complaint  Patient presents with   Lower Back - Pain    HPI 76 year old female long-term patient of Dr. Sharrie Rothman who had previous surgery L3-4 L4-5 in 2019.  Postop considerable problems getting mobile.  Additional history of carotid disease heart disease some knee pain with knee arthritis previous knee injections.  Heather [name] uses a knee sleeve brace on her right knee.  Heather [name] has problems if Heather [name] gets down on her knees on the floor.  Problems with stairs.  Patient has some scoliosis and multilevel disc degenerative changes with varying degrees of lateral recess and foraminal compression.  Heather [name] has  been on tramadol/acetaminophen from Dr. Louanne Skye for years and gets 40 tablets of 37.5/325 which usually last for 20 days.  Previous left femur fracture treated with IM rod by Dr. Elvina Mattes later removed by Dr. Achilles Dunk.  Patient has right hip osteoarthritis by x-ray but no limitation of motion and no groin pain.  Review of Systems all systems noncontributory HPI.   Objective: Vital Signs: BP (!) 194/82   Pulse 85   Ht 5\' 3"  (1.6 m)   Wt 153 lb (69.4 kg)   LMP 12/29/2011   BMI 27.10 kg/m   Physical Exam Constitutional:      Appearance: Heather [name] is well-developed.  HENT:     Head: Normocephalic.     Right Ear: External ear normal.     Left Ear: External ear normal. There is no impacted cerumen.  Eyes:     Pupils: Pupils are equal, round, and reactive to light.  Neck:     Thyroid: No thyromegaly.     Trachea: No tracheal deviation.  Cardiovascular:     Rate and Rhythm: Normal rate.  Pulmonary:     Effort: Pulmonary effort is normal.  Abdominal:     Palpations: Abdomen is soft.  Musculoskeletal:  Cervical back: No rigidity.  Skin:    General: Skin is warm and dry.  Neurological:     Mental Status: Heather [name] is alert and oriented to person, place, and time.  Psychiatric:        Behavior: Behavior normal.     Ortho Exam patient has a slow short stride gait right knee lacks full extension but passively does reach close to full extension.  Mild right knee swelling.  Negative logroll the hips.    Specialty Comments:  No specialty comments available.  Imaging: No results found.   PMFS History: Patient Active Problem List   Diagnosis Date Noted   Medicare annual wellness visit, subsequent 04/05/2021   Post-menopausal 04/05/2021   Advance directive discussed with patient 04/05/2021   Non-recurrent unilateral inguinal hernia without obstruction or gangrene 09/06/2020   Mass of left forearm 09/06/2020   OAB (overactive bladder) 11/18/2017   Hypertension 12/17/2016   Lumbar  stenosis with neurogenic claudication 07/03/2016   Carotid disease, bilateral (HCC) 03/03/2015   Seasonal allergies 03/03/2014   Genital herpes 03/03/2014   Hyperlipidemia 03/03/2013   Anxiety and depression 12/11/2011   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   Orogenital transmission (husband had cold sore; HSV1 culture positive)   Shortness of breath dyspnea    WITH EXERTION     Spinal stenosis of lumbar region    Stroke (HCC) 04/2015   "light stroke; sometimes my thinking is slow since" (12/17/2016)   Wears glasses     Family History  Adopted: Yes  Problem Relation Age of Onset   Osteoporosis Mother    Heart disease Mother    Hypertension Mother    Hyperlipidemia Mother    Stroke Mother    Arthritis Mother    Diabetes Father    Heart attack Father    Cancer Sister        breast   Colon cancer Neg Hx     Past Surgical History:  Procedure Laterality Date   CARPAL TUNNEL RELEASE Right 03/31/2013   Procedure: RIGHT CARPAL TUNNEL RELEASE;  Surgeon: Nicki Reaper, MD;  Location: [facility];  Service: Orthopedics;  Laterality: Right;   ENDARTERECTOMY Right 05/03/2015   Procedure: RIGHT CAROTID ENDARTERECTOMY ;  Surgeon: Fransisco Hertz, MD;  Location: [facility] OR;  Service: Vascular;  Laterality: Right;   FEMUR FRACTURE SURGERY Right 1975   "hip"   FRACTURE SURGERY     LUMBAR LAMINECTOMY N/A 05/26/2017   Procedure: BILATERAL PARTIAL HEMILAMINECTOMIES L3-4  AND L4-5;  Surgeon: Kerrin Champagne, MD;  Location: [facility] OR;  Service: Orthopedics;  Laterality: N/A;   NASAL SINUS SURGERY  1975   PLANTAR FASCIA RELEASE Bilateral 1968   SHOULDER OPEN ROTATOR CUFF REPAIR Right 1998   TONSILLECTOMY  1951   TUBAL LIGATION  1995   Social History  Occupational History   Occupation: Retired  Tobacco Use   Smoking status: Never   Smokeless tobacco: Never  Vaping Use   Vaping Use: Never used  Substance and Sexual Activity   Alcohol use: Not Currently    Alcohol/week: 3.0 standard drinks of alcohol    Types: 3 Cans of beer per week   Drug use: No   Sexual activity: Yes

## 2021-12-31 ENCOUNTER — Ambulatory Visit (INDEPENDENT_AMBULATORY_CARE_PROVIDER_SITE_OTHER): Payer: Medicare Other | Admitting: Nurse Practitioner

## 2021-12-31 ENCOUNTER — Encounter: Payer: Self-pay | Admitting: Nurse Practitioner

## 2021-12-31 ENCOUNTER — Telehealth: Payer: Self-pay | Admitting: Nurse Practitioner

## 2021-12-31 VITALS — BP 166/78 | HR 60 | Temp 98.8°F | Ht 63.0 in | Wt 151.0 lb

## 2021-12-31 DIAGNOSIS — N3941 Urge incontinence: Secondary | ICD-10-CM

## 2021-12-31 DIAGNOSIS — F32A Depression, unspecified: Secondary | ICD-10-CM

## 2021-12-31 DIAGNOSIS — M48062 Spinal stenosis, lumbar region with neurogenic claudication: Secondary | ICD-10-CM

## 2021-12-31 DIAGNOSIS — F419 Anxiety disorder, unspecified: Secondary | ICD-10-CM

## 2021-12-31 DIAGNOSIS — I10 Essential (primary) hypertension: Secondary | ICD-10-CM | POA: Diagnosis not present

## 2021-12-31 DIAGNOSIS — R300 Dysuria: Secondary | ICD-10-CM

## 2021-12-31 DIAGNOSIS — N3090 Cystitis, unspecified without hematuria: Secondary | ICD-10-CM

## 2021-12-31 LAB — POCT URINALYSIS DIPSTICK
Bilirubin, UA: NEGATIVE
Blood, UA: POSITIVE
Glucose, UA: NEGATIVE
Ketones, UA: NEGATIVE
Nitrite, UA: NEGATIVE
Protein, UA: NEGATIVE
Spec Grav, UA: <=1.005 — AB (ref 1.010–1.025)
Urobilinogen, UA: 0.2 E.U./dL
pH, UA: 6 (ref 5.0–8.0)

## 2021-12-31 MED ORDER — NITROFURANTOIN MONOHYD MACRO 100 MG PO CAPS: 100.0000 mg | ORAL_CAPSULE | Freq: Two times a day (BID) | ORAL | 0 refills | Status: DC

## 2021-12-31 NOTE — Progress Notes (Signed)
Established Patient Office Visit  Subjective   Patient ID: Heather [name], female    DOB: 1945/07/26  Age: 76 y.o. MRN: [id number]  Chief Complaint  Patient presents with   Follow-up    Anxiety     HPI  HTN: States that she is followed by Alfonzo Beers. She is on metoprolol and hctz. BP is on the higher side today. States that she is suppose to see cardiology later this month to transition care  Urine incontience: States that she has been having dyuria along with some incontinence. States that it has been going on approx 2 months.  Anxiety and depression: currently maintained on buspar 7.5mg  BID and doing well. States that her emotinons are quick to change  Chronic Pian: Patient was seeing Dr. Basil Dess, who has since retired. She has established with Dr. Rodell Perna. States she has had one visit with him thus far. She mentioned that he asked her to ask me to take over her pain medications. This is per his last office note also      12/31/2021    8:50 AM 04/05/2021   10:30 AM 04/05/2021    9:30 AM  PHQ9 SCORE ONLY  PHQ-9 Total Score 3 0 0       12/31/2021    8:56 AM 04/05/2021   10:30 AM 09/06/2020   12:58 PM  GAD 7 : Generalized Anxiety Score  Nervous, Anxious, on Edge 0 1 0  Control/stop worrying 1 0 0  Worry too much - different things 2 0 0  Trouble relaxing 0 0 0  Restless 0 0 0  Easily annoyed or irritable 0 0 0  Afraid - awful might happen 0 0 0  Total GAD 7 Score 3 1 0  Anxiety Difficulty Not difficult at all Not difficult at all Not difficult at all      Dr. Alfonzo Beers: Cardiology will be seeing Dr Kathlen Mody as patient states Dr. Cathie Olden is retiring   Dr. Rodell Perna: Ortho    Review of Systems  Constitutional:  Negative for chills and fever.  Respiratory:  Negative for shortness of breath.   Cardiovascular:  Negative for chest pain.  Gastrointestinal:        BM every other day  Genitourinary:  Positive for dysuria, frequency and urgency. Negative for  hematuria.  Psychiatric/Behavioral:  Negative for hallucinations and suicidal ideas.       Objective:     BP (!) 166/78   Pulse 60   Temp 98.8 F (37.1 C) (Temporal)   Ht 5\' 3"  (1.6 m)   Wt 151 lb (68.5 kg)   LMP 12/29/2011   SpO2 98%   BMI 26.75 kg/m  BP Readings from Last 3 Encounters:  12/31/21 (!) 166/78  12/25/21 (!) 194/82  05/15/21 (!) 142/76   Wt Readings from Last 3 Encounters:  12/31/21 151 lb (68.5 kg)  12/25/21 153 lb (69.4 kg)  05/15/21 148 lb (67.1 kg)      Physical Exam Vitals and nursing note reviewed.  Constitutional:      Appearance: Normal appearance.  Cardiovascular:     Rate and Rhythm: Normal rate and regular rhythm.     Heart sounds: Normal heart sounds.  Pulmonary:     Effort: Pulmonary effort is normal.     Breath sounds: Normal breath sounds.  Abdominal:     General: Bowel sounds are normal. There is no distension.     Palpations: There is no mass.  Tenderness: There is abdominal tenderness in the suprapubic area. There is no right CVA tenderness or left CVA tenderness.     Hernia: No hernia is present.  Neurological:     Mental Status: She is alert.      No results found for any visits on 12/31/21.    The ASCVD Risk score (Arnett DK, et al., 2019) failed to calculate for the following reasons:   Unable to determine if patient is Non-Hispanic African American    Assessment & Plan:   Problem List Items Addressed This Visit       Cardiovascular and Mediastinum   Hypertension    Blood pressure above goal.  Patient currently maintained on baby aspirin, metformin, and hydrochlorothiazide.  Patient has a follow-up with Tereso Newcomer 01/16/2022.  For further management of blood pressure        Genitourinary   Cystitis    Positive leuk and blood in urine.  Will treat with Macrobid 100 mg twice daily for 5 days.  Follow-up if no improvement pending urine culture.      Relevant Medications   nitrofurantoin,  macrocrystal-monohydrate, (MACROBID) 100 MG capsule     Other   Anxiety and depression    Patient's anxiety seems to be well-controlled on buspirone 7.5 mg twice daily.  Continue medication as prescribed.  Patient [place] HI/SI/AVH.      Lumbar stenosis with neurogenic claudication    Patient was followed by Dr. [name] who has since retired.  She did follow-up with Dr. Annell Greening per his last office note he did request PCP to take over her tramadol prescription as she is not going be actively followed by clinic since she is not a candidate for lumbar surgery.  We will assume care of this medication we will notify patient once urine culture is back.      Urge incontinence of urine    Likely secondary to cystitis.  Treat cystitis first follow-up if no improvement.      Dysuria - Primary    UA in office.      Relevant Orders   POCT urinalysis dipstick   Urine Culture    Return in about 6 months (around 07/02/2022) for CPE and Labs.    Audria Nine, NP

## 2021-12-31 NOTE — Telephone Encounter (Signed)
error 

## 2021-12-31 NOTE — Assessment & Plan Note (Signed)
Positive leuk and blood in urine.  Will treat with Macrobid 100 mg twice daily for 5 days.  Follow-up if no improvement pending urine culture.

## 2021-12-31 NOTE — Assessment & Plan Note (Signed)
Patient was followed by Dr. [name] who has since retired.  She did follow-up with Dr. Annell Greening per his last office note he did request PCP to take over her tramadol prescription as she is not going be actively followed by clinic since she is not a candidate for lumbar surgery.  We will assume care of this medication we will notify patient once urine culture is back.

## 2021-12-31 NOTE — Assessment & Plan Note (Signed)
Likely secondary to cystitis.  Treat cystitis first follow-up if no improvement.

## 2021-12-31 NOTE — Patient Instructions (Signed)
Nice to see you today I will be in touch with the urine culture once I have it Follow up with me in 6 months for your physical and labs, sooner if you need me  I sent in some antibiotics to the pharmacy for the urinary tract infection

## 2021-12-31 NOTE — Assessment & Plan Note (Signed)
Blood pressure above goal.  Patient currently maintained on baby aspirin, metformin, and hydrochlorothiazide.  Patient has a follow-up with Tereso Newcomer 01/16/2022.  For further management of blood pressure

## 2021-12-31 NOTE — Assessment & Plan Note (Signed)
Patient's anxiety seems to be well-controlled on buspirone 7.5 mg twice daily.  Continue medication as prescribed.  Patient [place] HI/SI/AVH.

## 2021-12-31 NOTE — Assessment & Plan Note (Signed)
UA in office. 

## 2022-01-03 LAB — URINE CULTURE
MICRO NUMBER:: 14265677
SPECIMEN QUALITY:: ADEQUATE

## 2022-01-11 ENCOUNTER — Other Ambulatory Visit: Payer: Self-pay | Admitting: Orthopaedic Surgery

## 2022-01-11 ENCOUNTER — Telehealth: Payer: Self-pay | Admitting: Orthopaedic Surgery

## 2022-01-11 NOTE — Telephone Encounter (Signed)
Pt called requesting a refill of tramadol. Please send to pharmacy on file. Pt phone number is [phone].

## 2022-01-15 ENCOUNTER — Telehealth: Payer: Self-pay | Admitting: Nurse Practitioner

## 2022-01-15 NOTE — Telephone Encounter (Signed)
Pt stated that she does need a refill. She wanted to let you know that the medication you prescribed for the infection has worked and pt is feeling better, but still has odor.

## 2022-01-15 NOTE — Telephone Encounter (Signed)
We can let her know that I will take over the medication as requested by her ortho provider. Does she need a refill?

## 2022-01-15 NOTE — Telephone Encounter (Signed)
Patient called to discuss medication traMADol-acetaminophen traMADol-acetaminophen (ULTRACET) 37.5-325 MG tablet .  Call back number [phone]

## 2022-01-16 ENCOUNTER — Encounter: Payer: Self-pay | Admitting: Physician Assistant

## 2022-01-16 ENCOUNTER — Ambulatory Visit: Payer: Medicare Other | Attending: Physician Assistant | Admitting: Cardiology

## 2022-01-16 VITALS — BP 190/86 | HR 59 | Ht 63.0 in | Wt 152.2 lb

## 2022-01-16 DIAGNOSIS — I6523 Occlusion and stenosis of bilateral carotid arteries: Secondary | ICD-10-CM | POA: Diagnosis not present

## 2022-01-16 DIAGNOSIS — E782 Mixed hyperlipidemia: Secondary | ICD-10-CM

## 2022-01-16 DIAGNOSIS — I10 Essential (primary) hypertension: Secondary | ICD-10-CM | POA: Diagnosis not present

## 2022-01-16 MED ORDER — AMLODIPINE BESYLATE 5 MG PO TABS: 5.0000 mg | ORAL_TABLET | Freq: Every day | ORAL | 3 refills | Status: DC

## 2022-01-16 MED ORDER — TRAMADOL-ACETAMINOPHEN 37.5-325 MG PO TABS: 1.0000 | ORAL_TABLET | Freq: Two times a day (BID) | ORAL | 0 refills | Status: DC | PRN

## 2022-01-16 NOTE — Patient Instructions (Signed)
Medication Instructions:  Your physician has recommended you make the following change in your medication:   START Amlodipine 5 mg taking 1 daily   *If you need a refill on your cardiac medications before your next appointment, please call your pharmacy*   Lab Work: None ordered  If you have labs (blood work) drawn today and your tests are completely normal, you will receive your results only by: MyChart Message (if you have MyChart) OR A paper copy in the mail If you have any lab test that is abnormal or we need to change your treatment, we will call you to review the results.   Testing/Procedures: None ordered   Follow-Up: At [facility], you and your health needs are our priority.  As part of our continuing mission to provide you with exceptional heart care, we have created designated Provider Care Teams.  These Care Teams include your primary Cardiologist (physician) and Advanced Practice Providers (APPs -  Physician Assistants and Nurse Practitioners) who all work together to provide you with the care you need, when you need it.  We recommend signing up for the patient portal called "MyChart".  Sign up information is provided on this After Visit Summary.  MyChart is used to connect with patients for Virtual Visits (Telemedicine).  Patients are able to view lab/test results, encounter notes, upcoming appointments, etc.  Non-urgent messages can be sent to your provider as well.   To learn more about what you can do with MyChart, go to [email or web address].    Your next appointment:   02/13/22 arrive at 10:40   The format for your next appointment:   In Person  Provider:   Wallis Bamberg, NP     Other Instructions    Important Information About Sugar

## 2022-01-16 NOTE — Telephone Encounter (Signed)
Medication sent to the pharmacy  If she has more trouble with her urine she will need to be seen again. She can increase her water intake to see if that helps with the odor

## 2022-01-16 NOTE — Progress Notes (Signed)
Cardiology Office Note:    Date:  01/16/2022   ID:  Heather, [name] 09-06-1945, MRN [id number]  PCP:  Eden Emms, NP   [facility] Providers Cardiologist:  Kristeen Miss, MD     Referring MD: Eden Emms, NP   No chief complaint on file.   History of Present Illness:    Heather [name] is a 76 y.o. female with a hx of HTN, HLD, carotid artery disease, R CEA 2017, PAD  Last evaluated in office with Dr. Elease Hashimoto on 05/15/21, her BP was suboptimal at that time and her HCTZ was increased to 25 mg daily.   She presents today for a routine follow up. She states overall she is doing well. She is active, and goes to work with her boyfriend daily and helps him with his work duties. She endorses that eating a heart healthy diet has been challenging for as she typically eats out with him at work. She checks her BP sporadically, when she does states it is "in the 150's and above". She denies chest pain, palpitations, dyspnea, pnd, orthopnea, n, v, dizziness, syncope, edema, weight gain, or early satiety. She also denies blurred vision and headaches. Her BP in the office today is very elevated in spite of taking her medications a few hours ago.   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   Orogenital transmission (husband had cold sore; HSV1 culture positive)   Shortness of breath dyspnea    WITH EXERTION     Spinal stenosis of lumbar region    Stroke [facility])  04/2015   "light stroke; sometimes my thinking is slow since" (12/17/2016)   Wears glasses     Past Surgical History:  Procedure Laterality Date   CARPAL TUNNEL RELEASE Right 03/31/2013   Procedure: RIGHT CARPAL TUNNEL RELEASE;  Surgeon: Nicki Reaper, MD;  Location: [facility];  Service: Orthopedics;  Laterality: Right;   ENDARTERECTOMY Right 05/03/2015   Procedure: RIGHT CAROTID ENDARTERECTOMY ;  Surgeon: Fransisco Hertz, MD;  Location[facility] OR;  Service: Vascular;  Laterality: Right;   FEMUR FRACTURE SURGERY Right 1975   "hip"   FRACTURE SURGERY     LUMBAR LAMINECTOMY N/A 05/26/2017   Procedure: BILATERAL PARTIAL HEMILAMINECTOMIES L3-4 AND L4-5;  Surgeon: Kerrin Champagne, MD;  Location: [facility] OR;  Service: Orthopedics;  Laterality: N/A;   NASAL SINUS SURGERY  1975   PLANTAR FASCIA RELEASE Bilateral 1968   SHOULDER OPEN ROTATOR CUFF REPAIR Right 1998   TONSILLECTOMY  1951   TUBAL LIGATION  1995    Current Medications: Current Meds  Medication Sig   acetaminophen (TYLENOL) 325 MG tablet Take 2 tablets (650 mg total) by mouth every 6 (six) hours as needed for moderate pain.   amLODipine (NORVASC) 5 MG tablet Take 1 tablet (5  mg total) by mouth daily.   Ascorbic Acid (VITAMIN C) 1000 MG tablet Take 1,000 mg by mouth daily.   aspirin EC 81 MG tablet Take 1 tablet (81 mg total) by mouth daily.   atorvastatin (LIPITOR) 40 MG tablet TAKE 1 TABLET BY MOUTH DAILY AT 6 PM.   busPIRone (BUSPAR) 7.5 MG tablet TAKE 1 TABLET BY MOUTH 2 TIMES DAILY.   gabapentin (NEURONTIN) 300 MG capsule Take 300 mg by mouth every evening.   hydrochlorothiazide (HYDRODIURIL) 25 MG tablet Take 1 tablet (25 mg total) by mouth daily.   levocetirizine (XYZAL) 5 MG tablet TAKE 1 TABLET BY MOUTH EVERY DAY IN THE EVENING   lidocaine (XYLOCAINE) 2 % jelly Apply 1 application topically as needed. Apply to the skin over the knees and hips up to BID   methocarbamol (ROBAXIN) 500 MG tablet TAKE 1 TABLET BY MOUTH EVERY 8  TO 12 HOURS AS NEEDED FOR MUSCLE SPASMS   metoprolol tartrate (LOPRESSOR) 25 MG tablet TAKE 1 TABLET BY MOUTH TWICE A DAY   nitrofurantoin, macrocrystal-monohydrate, (MACROBID) 100 MG capsule Take 1 capsule (100 mg total) by mouth 2 (two) times daily.   traMADol-acetaminophen (ULTRACET) 37.5-325 MG tablet Take 1 tablet by mouth every 12 (twelve) hours as needed.   valACYclovir (VALTREX) 500 MG tablet TAKE 1 TABLET (500 MG TOTAL) BY MOUTH DAILY.     Allergies:   Sulfamethoxazole   Social History   Socioeconomic History   Marital status: Divorced    Spouse name: Not on file   Number of children: 1   Years of education: 12   Highest education level: Not on file  Occupational History   Occupation: Retired  Tobacco Use   Smoking status: Never   Smokeless tobacco: Never  Vaping Use   Vaping Use: Never used  Substance and Sexual Activity   Alcohol use: Not Currently    Alcohol/week: 3.0 standard drinks of alcohol    Types: 3 Cans of beer per week   Drug use: No   Sexual activity: Yes  Other Topics Concern   Not on file  Social History Narrative   Lives at home w/ significant other   Right-handed   Drinks 2 cups caffeine per day   Social Determinants of Corporate investment banker Strain: Not on file  Food Insecurity: Not on file  Transportation Needs: Not on file  Physical Activity: Not on file  Stress: Not on file  Social Connections: Not on file     Family History: The patient's family history includes Arthritis in her mother; Cancer in her sister; Diabetes in her father; Heart attack in her father; Heart disease in her mother; Hyperlipidemia in her mother; Hypertension in her mother; Osteoporosis in her mother; Stroke in her mother. There is no history of Colon cancer. She was adopted.  ROS:   Review of Systems  Constitutional: Negative.   HENT: Negative.    Eyes:  Negative for blurred vision, double vision and photophobia.  Respiratory: Negative.     Cardiovascular:  Negative for chest pain, palpitations, orthopnea, claudication, leg swelling and PND.  Gastrointestinal: Negative.   Genitourinary: Negative.   Musculoskeletal:  Positive for back pain (chronic) and joint pain (bilateral knee pain). Negative for falls.  Skin: Negative.   Neurological:  Negative for dizziness, loss of consciousness and headaches.  Endo/Heme/Allergies: Negative.   Psychiatric/Behavioral: Negative.       EKGs/Labs/Other Studies Reviewed:    The following studies were reviewed today:  02/21/2017 Carotid arterial  duplex - Right Carotid: Velocities in the right ICA are consistent with a 1-39% stenosis, with a patent endarterectomy site. Left Carotid: Velocities in the left ICA are consistent with a 1-39% stenosis.   12/18/16 echo complete - LVEF 60-65%. LA moderately dilated. PV with mild regurgitation. TV with mild regurgitation. MV mildly thickened leaflets   EKG:  EKG is not ordered today.    Recent Labs: 04/05/2021: ALT 19; Hemoglobin 12.7; Platelets 310.0; TSH 2.65 06/05/2021: BUN 20; Creatinine, Ser 0.82; Potassium 3.9; Sodium 137  Recent Lipid Panel    Component Value Date/Time   CHOL 184 04/05/2021 1023   CHOL 170 04/21/2020 1011   TRIG 110.0 04/05/2021 1023   HDL 75.80 04/05/2021 1023   HDL 76 04/21/2020 1011   CHOLHDL 2 04/05/2021 1023   VLDL 22.0 04/05/2021 1023   LDLCALC 86 04/05/2021 1023   LDLCALC 74 04/21/2020 1011   LDLDIRECT 132.0 02/21/2015 1331     Risk Assessment/Calculations:      HYPERTENSION CONTROL Vitals:   01/16/22 1118 01/16/22 1207 01/16/22 1208  BP: (!) 214/72 (!) 200/90 (!) 190/86    The patient's blood pressure is elevated above target today.  In order to address the patient's elevated BP: A new medication was prescribed today.            Physical Exam:    VS:  BP (!) 190/86 (BP Location: Left Arm)   Pulse (!) 59   Ht 5\' 3"  (1.6 m)   Wt 152 lb 3.2 oz (69 kg)   LMP 12/29/2011   SpO2 99%   BMI  26.96 kg/m     Wt Readings from Last 3 Encounters:  01/16/22 152 lb 3.2 oz (69 kg)  12/31/21 151 lb (68.5 kg)  12/25/21 153 lb (69.4 kg)     GEN:  Well nourished, well developed in no acute distress HEENT: Normal NECK: No JVD; No carotid bruits LYMPHATICS: No lymphadenopathy CARDIAC: RRR, no murmurs, rubs, gallops RESPIRATORY:  Clear to auscultation without rales, wheezing or rhonchi  ABDOMEN: Soft, non-tender, non-distended MUSCULOSKELETAL:  No edema; No deformity  SKIN: Warm and dry NEUROLOGIC:  Alert and oriented x 3 PSYCHIATRIC:  Normal affect   ASSESSMENT:    1. Essential hypertension   2. Bilateral carotid artery stenosis   3. Mixed hyperlipidemia    PLAN:    In order of problems listed above:  Essential hypertension - BP today is elevated.  Previous readings from recent PCP 12/31/21 BP 166/78; ortho MD 12/25/21 194/82. She checks her BP sporadically, when she does states it is "in the 150's and above". Currently taking HCTZ 25 mg daily, metoprolol tartrate 25 mg twice daily. Start amlodipine 5 mg daily. Will have her return in 1 month for BP follow up as she reports she likely will not check her BP at home.  Bilateral carotid artery disease - last carotid US was in 2019, she has not follow up with [facility] since Dr. Imogene Burnhen left the practice and really does not want to establish with a new provider there. Discussed current recommendations for annual carotid US, she expresses hesitation and states she is scared to find out the results. Discussed stroke risk factors and the importance of repeat carotid US. She was agreeable to think about it and broach the topic with Dr. Elease HashimotoNahser at her next appt with him.  Mixed hyperlipidemia - PCP checked in March 2023, LDL was 86. Currently on atorvastatin 40 mg daily. Optimal LDL would be < 70. Discussed dietary changes,  she began to cry, states she knows she eats food that is not ideal for her cholesterol but this is one thing she does with her  boyfriend that brings her happiness. She really cannot exercise much d/t her knee pain. She does not want to go up on her atorvastatin dose today. She would prefer for it to be rechecked in March with her PCP and see if it is in a more acceptable range.            Medication Adjustments/Labs and Tests Ordered: Current medicines are reviewed at length with the patient today.  Concerns regarding medicines are outlined above.  No orders of the defined types were placed in this encounter.  Meds ordered this encounter  Medications   amLODipine (NORVASC) 5 MG tablet    Sig: Take 1 tablet (5 mg total) by mouth daily.    Dispense:  90 tablet    Refill:  3    Patient Instructions  Medication Instructions:  Your physician has recommended you make the following change in your medication:   START Amlodipine 5 mg taking 1 daily   *If you need a refill on your cardiac medications before your next appointment, please call your pharmacy*   Lab Work: None ordered  If you have labs (blood work) drawn today and your tests are completely normal, you will receive your results only by: MyChart Message (if you have MyChart) OR A paper copy in the mail If you have any lab test that is abnormal or we need to change your treatment, we will call you to review the results.   Testing/Procedures: None ordered   Follow-Up: At [facility], you and your health needs are our priority.  As part of our continuing mission to provide you with exceptional heart care, we have created designated Provider Care Teams.  These Care Teams include your primary Cardiologist (physician) and Advanced Practice Providers (APPs -  Physician Assistants and Nurse Practitioners) who all work together to provide you with the care you need, when you need it.  We recommend signing up for the patient portal called "MyChart".  Sign up information is provided on this After Visit Summary.  MyChart is used to connect with  patients for Virtual Visits (Telemedicine).  Patients are able to view lab/test results, encounter notes, upcoming appointments, etc.  Non-urgent messages can be sent to your provider as well.   To learn more about what you can do with MyChart, go to [email or web address].    Your next appointment:   02/13/22 arrive at 10:40   The format for your next appointment:   In Person  Provider:   Wallis Bamberg, NP     Other Instructions    Important Information About Sugar         Signed, Flossie Dibble, NP  01/16/2022 4:31 PM    [facility]

## 2022-01-17 NOTE — Telephone Encounter (Signed)
Called patient reviewed all information and repeated back to me. Will call if any questions.  ? ?

## 2022-01-22 ENCOUNTER — Telehealth: Payer: Self-pay | Admitting: Cardiovascular Disease

## 2022-01-22 NOTE — Telephone Encounter (Signed)
Pt c/o medication issue:  1. Name of Medication: amLODipine (NORVASC) 5 MG tablet   2. How are you currently taking this medication (dosage and times per day)?   3. Are you having a reaction (difficulty breathing--STAT)?   4. What is your medication issue? Patient has not started this med and would like a call back to discuss taking this medication with other medication. Please advise.

## 2022-01-22 NOTE — Telephone Encounter (Signed)
Per last visit on 12/20 w/Jennifer Elliot Gurney, NP:  Essential hypertension - BP today is elevated.  Previous readings from recent PCP 12/31/21 BP 166/78; ortho MD 12/25/21 194/82. She checks her BP sporadically, when she does states it is "in the 150's and above". Currently taking HCTZ 25 mg daily, metoprolol tartrate 25 mg twice daily. Start amlodipine 5 mg daily. Will have her return in 1 month for BP follow up as she reports she likely will not check her BP at home.   Returned call to patient who wanted to verify that she take the Amlodipine in addition to the other medications, not in place of (or expected to stop) any of the other meds. She states she will begin it now.

## 2022-02-04 ENCOUNTER — Other Ambulatory Visit: Payer: Self-pay | Admitting: Nurse Practitioner

## 2022-02-12 NOTE — Progress Notes (Signed)
Cardiology Clinic Note   Patient Name: Heather [name] Date of Encounter: 02/13/2022  Primary Care Provider:  Eden Emms, NP Primary Cardiologist:  Kristeen Miss, MD  Patient Profile    Heather [name] is a 77 y.o. female with a past medical history of bilateral carotid disease s/p right CEA 2017, hypertension, hyperlipidemia who presents to the clinic today for 1 month follow-up.   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   Orogenital transmission (husband had cold sore; HSV1 culture positive)   Shortness of breath dyspnea    WITH EXERTION     Spinal stenosis of lumbar region    Stroke [facility]) 04/2015   "light stroke; sometimes my thinking is slow since" (12/17/2016)   Wears glasses    Past Surgical History:  Procedure Laterality Date   CARPAL TUNNEL RELEASE Right 03/31/2013   Procedure: RIGHT CARPAL TUNNEL RELEASE;  Surgeon: Nicki Reaper, MD;  Location: [facility];  Service: Orthopedics;  Laterality: Right;   ENDARTERECTOMY Right 05/03/2015   Procedure: RIGHT CAROTID ENDARTERECTOMY ;  Surgeon: Fransisco Hertz, MD;  Location: [facility] OR;  Service: Vascular;  Laterality: Right;   FEMUR FRACTURE SURGERY Right 1975   "hip"   FRACTURE SURGERY     LUMBAR LAMINECTOMY N/A 05/26/2017   Procedure: BILATERAL PARTIAL HEMILAMINECTOMIES L3-4 AND L4-5;  Surgeon: Kerrin Champagne, MD;  Location: [facility] OR;  Service: Orthopedics;  Laterality: N/A;    NASAL SINUS SURGERY  1975   PLANTAR FASCIA RELEASE Bilateral 1968   SHOULDER OPEN ROTATOR CUFF REPAIR Right 1998   TONSILLECTOMY  1951   TUBAL LIGATION  1995    Allergies  Allergies  Allergen Reactions   Sulfamethoxazole Swelling and Other (See Comments)    SWELLING REACTION UNSPECIFIED     History of Present Illness    Heather [name] has a past medical history of: Bilateral carotid disease. Right carotid endarterectomy 05/03/2015: Performed by Dr. Imogene Burn, vascular surgery.   Carotid ultrasound 02/21/2017: Right ICA 1 to 39% with patent endarterectomy site.  Left ICA 1 to 39%. Echo 12/18/2016: EF 60-65%. Elevated LVEDP and left atrial filling pressure. Moderate LAE. Trivial MR.  Hypertension. Hyperlipidemia. Lipid panel 04/05/2021: LDL 86, HDL 76, TG 110, total 184.  Heather [name] was first evaluated by Dr. Elease Hashimoto on 03/08/2015 prior to CEA. She complained of exertional chest pain so nuclear stress testing was ordered which was a normal., low risk study. She was started on atorvastatin for hyperlipidemia and losartan was added for hypertension.   Patient was last seen in the office by Wallis Bamberg, NP on 01/16/2022. At that time patient was hypertensive with BP reading >190/70 x 3 despite taking medications several hours prior to appointment. Amlodipine 5 mg was added. Patient admited she would not check BP  at home so she was scheduled to return in one month.  BP Readings from Last 3 Encounters:  02/13/22 (!) 172/74  01/16/22 (!) 190/86  12/31/21 (!) 166/78    Today, patient continues to be hypertensive. BP 174/74 at intake and 162/70 on my recheck. She reports she does not drive that often and drove herself today not in her usual car. She also states she suffers from a lot of pain in her back and bilateral knees. She endorses a lot of anxiety as well. 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 She did  check her BP twice since starting amlodipine this past weekend. BP was 168/71 before medications and 144/61 after. She denies headaches or dizziness when BP is high. She restricts her sodium in her morning and evening meals but typically has some form of take out in the afternoon secondary to needing to eat something that is quick and easy.    Home Medications    Current Meds  Medication Sig   acetaminophen (TYLENOL) 325 MG tablet Take 2 tablets (650 mg total) by mouth every 6 (six) hours as needed for moderate pain.   amLODipine (NORVASC) 5 MG tablet Take 1 tablet (5 mg total) by mouth daily.   Ascorbic Acid (VITAMIN C) 1000 MG tablet Take 1,000 mg by mouth daily.   aspirin EC 81 MG tablet Take 1 tablet (81 mg total) by mouth daily.   atorvastatin (LIPITOR) 40 MG tablet TAKE 1 TABLET BY MOUTH DAILY AT 6 PM.   busPIRone (BUSPAR) 7.5 MG tablet TAKE 1 TABLET BY MOUTH 2 TIMES DAILY.   gabapentin (NEURONTIN) 300 MG capsule Take 300 mg by mouth every evening.   hydrochlorothiazide (HYDRODIURIL) 25 MG tablet Take 1 tablet (25 mg total) by mouth daily.   levocetirizine (XYZAL) 5 MG tablet TAKE 1 TABLET BY MOUTH EVERY DAY IN THE EVENING   lidocaine (XYLOCAINE) 2 % jelly Apply 1 application topically as needed. Apply to the skin over the knees and hips up to BID   methocarbamol (ROBAXIN) 500 MG tablet TAKE 1 TABLET BY MOUTH EVERY 8 TO 12 HOURS AS NEEDED FOR MUSCLE SPASMS   metoprolol tartrate (LOPRESSOR) 25 MG tablet TAKE 1 TABLET BY MOUTH TWICE A DAY   nitrofurantoin, macrocrystal-monohydrate, (MACROBID) 100 MG capsule Take 1 capsule (100 mg total) by mouth 2 (two) times daily.   traMADol-acetaminophen (ULTRACET) 37.5-325 MG tablet TAKE 1 TABLET BY MOUTH EVERY 12 HOURS AS NEEDED.   valACYclovir (VALTREX) 500 MG tablet TAKE 1 TABLET (500 MG TOTAL) BY MOUTH DAILY.    Family History    Family History  Adopted: Yes  Problem Relation Age of Onset   Osteoporosis Mother    Heart disease Mother     Hypertension Mother    Hyperlipidemia Mother    Stroke Mother    Arthritis Mother    Diabetes Father    Heart attack Father    Cancer Sister        breast   Colon cancer Neg Hx    is adopted.    Social History    Social History   Socioeconomic History   Marital status: Divorced    Spouse name: Not on file   Number of children: 1   Years of education: 12   Highest education level: Not on file  Occupational History   Occupation: Retired  Tobacco Use   Smoking  status: Never   Smokeless tobacco: Never  Vaping Use   Vaping Use: Never used  Substance and Sexual Activity   Alcohol use: Not Currently    Alcohol/week: 3.0 standard drinks of alcohol    Types: 3 Cans of beer per week   Drug use: No   Sexual activity: Yes  Other Topics Concern   Not on file  Social History Narrative   Lives at home w/ significant other   Right-handed   Drinks 2 cups caffeine per day   Social Determinants of Health   Financial Resource Strain: Not on file  Food Insecurity: Not on file  Transportation Needs: Not on file  Physical Activity: Not on file  Stress: Not on file  Social Connections: Not on file  Intimate Partner Violence: Not on file     Review of Systems    General:  No chills, fever, night sweats or weight changes.  Cardiovascular:  No chest pain, dyspnea on exertion, edema, orthopnea, palpitations, paroxysmal nocturnal dyspnea. Dermatological: No rash, lesions/masses Respiratory: No cough, dyspnea Urologic: No hematuria, dysuria Abdominal:   No nausea, vomiting, diarrhea, bright red blood per rectum, melena, or hematemesis Neurologic:  No visual changes, weakness, changes in mental status. All other systems reviewed and are otherwise negative except as noted above.  Physical Exam    VS:  BP (!) 172/74   Pulse 70   Ht 5\' 3"  (1.6 m)   Wt 153 lb (69.4 kg)   LMP 12/29/2011   BMI 27.10 kg/m  , BMI Body mass index is 27.1 kg/m. GEN:  Well nourished, well developed,  in no acute distress. HEENT: Normal. Neck: Supple, no JVD, carotid bruits, or masses. Cardiac: RRR, no murmurs, rubs, or gallops. No clubbing, cyanosis, edema.  Radials/DP/PT 2+ and equal bilaterally.  Respiratory:  Respirations regular and unlabored, clear to auscultation bilaterally. GI: Soft, nontender, nondistended. MS: No deformity or atrophy. Skin: Warm and dry, no rash. Neuro: Strength and sensation are intact. Psych: Anxious affect.  Accessory Clinical Findings    Recent Labs: 04/05/2021: ALT 19; Hemoglobin 12.7; Platelets 310.0; TSH 2.65 06/05/2021: BUN 20; Creatinine, Ser 0.82; Potassium 3.9; Sodium 137   Recent Lipid Panel    Component Value Date/Time   CHOL 184 04/05/2021 1023   CHOL 170 04/21/2020 1011   TRIG 110.0 04/05/2021 1023   HDL 75.80 04/05/2021 1023   HDL 76 04/21/2020 1011   CHOLHDL 2 04/05/2021 1023   VLDL 22.0 04/05/2021 1023   LDLCALC 86 04/05/2021 1023   LDLCALC 74 04/21/2020 1011   LDLDIRECT 132.0 02/21/2015 1331    HYPERTENSION CONTROL Vitals:   02/13/22 1052 02/13/22 1233  BP: (!) 172/74 (!) 162/70    The patient's blood pressure is elevated above target today.  In order to address the patient's elevated BP: A current anti-hypertensive medication was adjusted today.    ECG is not indicated today.    Assessment & Plan   Hypertension. BP today 172/74 on intake and 162/70 with my recheck.  Patient took her BP twice at home since starting amlodipine this past weekend. 168/71 before medications and 144/61 after. She denies headaches or dizziness with increased BP. She has a lot of chronic pain issues in her back and knees as well as anxiety. Will increase amlodipine to 10 mg daily. She will check her BP once a day in the evenings.  Hyperlipidemia. LDL March 2023 86, not at goal. Patient declined increase of atorvastatin at previous visit. Continues to decline. She would like  to see what her repeat LDL is in March with PCP.  Carotid artery  disease. S/p right ECA 2017. Carotid US January 2019 showed bilateral ICA 1-39% wit patent endarterectomy site on the right. Patient declines repeat US at this time.    Disposition: Increase amlodipine to 10 mg daily. Keep log of BP, 1 reading each evening. Return in 1 month for BP check with Pharm D. Follow-up in 6 months or sooner as needed.    Heather [name]. Joyelle Siedlecki, DNP, NP-C     02/13/2022, 10:56 AM [place] [address] Office [phone] Fax [phone]

## 2022-02-13 ENCOUNTER — Ambulatory Visit: Payer: Medicare Other | Attending: Physician Assistant | Admitting: Student

## 2022-02-13 ENCOUNTER — Encounter: Payer: Self-pay | Admitting: Student

## 2022-02-13 VITALS — BP 162/70 | HR 70 | Ht 63.0 in | Wt 153.0 lb

## 2022-02-13 DIAGNOSIS — I6523 Occlusion and stenosis of bilateral carotid arteries: Secondary | ICD-10-CM

## 2022-02-13 DIAGNOSIS — I10 Essential (primary) hypertension: Secondary | ICD-10-CM

## 2022-02-13 DIAGNOSIS — E782 Mixed hyperlipidemia: Secondary | ICD-10-CM | POA: Diagnosis not present

## 2022-02-13 MED ORDER — AMLODIPINE BESYLATE 10 MG PO TABS: 10.0000 mg | ORAL_TABLET | Freq: Every day | ORAL | 3 refills | Status: DC

## 2022-02-13 NOTE — Patient Instructions (Addendum)
Medication Instructions:  Your physician has recommended you make the following change in your medication:   INCREASE the Amlodipine to 10 mg taking 1 daily   *If you need a refill on your cardiac medications before your next appointment, please call your pharmacy*   Lab Work: None ordered  If you have labs (blood work) drawn today and your tests are completely normal, you will receive your results only by: [place] (if you have MyChart) OR A paper copy in the mail If you have any lab test that is abnormal or we need to change your treatment, we will call you to review the results.   Testing/Procedures: None ordered  You have been referred to Pharmacist for Blood pressure.  Your physician has requested that you regularly monitor and record your blood pressure readings at home. Please use the same machine at the same time of day to check your readings and record them to bring to your follow-up visit.   Please monitor blood pressures and keep a log of your readings until you see the Pharmacist and [place]    Make sure to check 2 hours after your medications.    AVOID these things for 30 minutes before checking your blood pressure: No Drinking caffeine. No Drinking alcohol. No Eating. No Smoking. No Exercising.   Five minutes before checking your blood pressure: Pee. Sit in a dining chair. Avoid sitting in a soft couch or armchair. Be quiet. Do not talk     Follow-Up: At [facility], you and your health needs are our priority.  As part of our continuing mission to provide you with exceptional heart care, we have created designated Provider Care Teams.  These Care Teams include your primary Cardiologist (physician) and Advanced Practice Providers (APPs -  Physician Assistants and Nurse Practitioners) who all work together to provide you with the care you need, when you need it.  We recommend signing up for the patient portal called  "MyChart".  Sign up information is provided on this After Visit Summary.  MyChart is used to connect with patients for Virtual Visits (Telemedicine).  Patients are able to view lab/test results, encounter notes, upcoming appointments, etc.  Non-urgent messages can be sent to your provider as well.   To learn more about what you can do with MyChart, go to [email or web address].    Your next appointment:   6 month(s)  Provider:   Mertie Moores, MD     Other Instructions Blood Pressure Record Sheet  Date: _______________________ a.m. _____________________(1st reading) _____________________(2nd reading) p.m. _____________________(1st reading) _____________________(2nd reading) Date: _______________________ a.m. _____________________(1st reading) _____________________(2nd reading) p.m. _____________________(1st reading) _____________________(2nd reading) Date: _______________________ a.m. _____________________(1st reading) _____________________(2nd reading) p.m. _____________________(1st reading) _____________________(2nd reading) Date: _______________________ a.m. _____________________(1st reading) _____________________(2nd reading) p.m. _____________________(1st reading) _____________________(2nd reading) Date: _______________________ a.m. _____________________(1st reading) _____________________(2nd reading) p.m. _____________________(1st reading) _____________________(2nd reading) Date: _______________________ a.m. _____________________(1st reading) _____________________(2nd reading) p.m. _____________________(1st reading) _____________________(2nd reading) Date: _______________________ a.m. _____________________(1st reading) _____________________(2nd reading) p.m. _____________________(1st reading) _____________________(2nd reading) Date: _______________________ a.m. _____________________(1st reading) _____________________(2nd reading) p.m. _____________________(1st reading)  _____________________(2nd reading) Date: _______________________ a.m. _____________________(1st reading) _____________________(2nd reading) p.m. _____________________(1st reading) _____________________(2nd reading) Date: _______________________ a.m. _____________________(1st reading) _____________________(2nd reading) p.m. _____________________(1st reading) _____________________(2nd reading)  Date: _______________________ a.m. _____________________(1st reading) _____________________(2nd reading) p.m. _____________________(1st reading) _____________________(2nd reading) Date: _______________________ a.m. _____________________(1st reading) _____________________(2nd reading) p.m. _____________________(1st reading) _____________________(2nd reading) Date: _______________________ a.m. _____________________(1st reading) _____________________(2nd reading) p.m. _____________________(1st reading) _____________________(2nd reading) Date: _______________________ a.m. _____________________(1st reading) _____________________(2nd reading)  p.m. _____________________(1st reading) _____________________(2nd reading) Date: _______________________ a.m. _____________________(1st reading) _____________________(2nd reading) p.m. _____________________(1st reading) _____________________(2nd reading)

## 2022-02-25 ENCOUNTER — Telehealth: Payer: Self-pay | Admitting: Student

## 2022-02-25 ENCOUNTER — Other Ambulatory Visit: Payer: Self-pay | Admitting: Nurse Practitioner

## 2022-02-25 NOTE — Telephone Encounter (Signed)
Pt c/o medication issue:  1. Name of Medication:    amLODipine (NORVASC) 10 MG tablet    2. How are you currently taking this medication (dosage and times per day)? As written   3. Are you having a reaction (difficulty breathing--STAT)? No   4. What is your medication issue? Pt called in stating ever since the dosage was increased  she reports swelling in feet, legs, and hands, no SOB. She states she has this hot feeling going up her body and getting very flush in the face. She also states she has some tightness in her neck. She states her bp is down and she hasn't taken the medication today. She cannot handle the side effect and would like to speak to someone about possibly changing it to something else.

## 2022-02-25 NOTE — Telephone Encounter (Signed)
Left a message to call back.

## 2022-02-26 NOTE — Telephone Encounter (Signed)
Left message to call back  

## 2022-02-26 NOTE — Telephone Encounter (Signed)
Pt is returning call and requesting a call back.  

## 2022-02-28 NOTE — Telephone Encounter (Signed)
Attempted to reach patient again, no answer, left message to call office back to provide BP readings. Per last OV note on 02/13/22 by Campbell Riches, NP: Hypertension. BP today 172/74 on intake and 162/70 with my recheck.  Patient took her BP twice at home since starting amlodipine this past weekend. 168/71 before medications and 144/61 after. She denies headaches or dizziness with increased BP. She has a lot of chronic pain issues in her back and knees as well as anxiety. Will increase amlodipine to 10 mg daily. She will check her BP once a day in the evenings.    Disposition: Increase amlodipine to 10 mg daily. Keep log of BP, 1 reading each evening. Return in 1 month for BP check with Pharm D. Follow-up in 6 months or sooner as needed.   Awaiting call back from patient.

## 2022-03-11 NOTE — Telephone Encounter (Signed)
Patient states that she was unable to tolerate amlodipine 10 mg daily. She has gone back to 5 mg daily. She states she is doing much better. She has been keeping up with her BP and has a list of readings but she is not at home to give them to me. She states that they are doing better and she will call us back tomorrow morning with her readings.

## 2022-03-18 NOTE — Telephone Encounter (Signed)
Lm to call back ./cy 

## 2022-03-18 NOTE — Telephone Encounter (Signed)
Lmtcb 2/19 .Heather [name]

## 2022-03-20 ENCOUNTER — Other Ambulatory Visit: Payer: Self-pay | Admitting: Nurse Practitioner

## 2022-03-25 NOTE — Telephone Encounter (Signed)
Pt is scheduled in HTN clinic on 03/27/22

## 2022-03-27 ENCOUNTER — Ambulatory Visit: Payer: Medicare Other | Attending: Interventional Cardiology | Admitting: Pharmacist

## 2022-03-27 ENCOUNTER — Telehealth: Payer: Self-pay | Admitting: Nurse Practitioner

## 2022-03-27 ENCOUNTER — Other Ambulatory Visit: Payer: Self-pay | Admitting: Nurse Practitioner

## 2022-03-27 VITALS — BP 156/60 | HR 85

## 2022-03-27 DIAGNOSIS — I10 Essential (primary) hypertension: Secondary | ICD-10-CM

## 2022-03-27 DIAGNOSIS — A6004 Herpesviral vulvovaginitis: Secondary | ICD-10-CM

## 2022-03-27 MED ORDER — AMLODIPINE BESYLATE 5 MG PO TABS: 5.0000 mg | ORAL_TABLET | Freq: Every day | ORAL | 3 refills | Status: DC

## 2022-03-27 MED ORDER — IRBESARTAN 150 MG PO TABS: 150.0000 mg | ORAL_TABLET | Freq: Every day | ORAL | 3 refills | Status: DC

## 2022-03-27 NOTE — Patient Instructions (Addendum)
Summary of today's discussion  1.Start taking irbesartan '150mg'$  daily  2.Continue amlodipine '5mg'$  daily, hydrochlorothiazide '25mg'$  daily, metoprolol tartrate '25mg'$  twice a day  3. Continue checking blood pressure at home/work  4.Bring blood pressure machine to next visit  5.   Your blood pressure goal is <130/80  To check your pressure at home you will need to:  1. Sit up in a chair, with feet flat on the floor and back supported. Do not cross your ankles or legs. 2. Rest your left arm so that the cuff is about heart level. If the cuff goes on your upper arm,  then just relax the arm on the table, arm of the chair or your lap. If you have a wrist cuff, we  suggest relaxing your wrist against your chest (think of it as Pledging the Flag with the  wrong arm).  3. Place the cuff snugly around your arm, about 1 inch above the crook of your elbow. The  cords should be inside the groove of your elbow.  4. Sit quietly, with the cuff in place, for about 5 minutes. After that 5 minutes press the power  button to start a reading. 5. Do not talk or move while the reading is taking place.  6. Record your readings on a sheet of paper. Although most cuffs have a memory, it is often  easier to see a pattern developing when the numbers are all in front of you.  7. You can repeat the reading after 1-3 minutes if it is recommended  Make sure your bladder is empty and you have not had caffeine or tobacco within the last 30 min  Always bring your blood pressure log with you to your appointments. If you have not brought your monitor in to be double checked for accuracy, please bring it to your next appointment.  You can find a list of validated (accurate) blood pressure cuffs at PopPath.it   Important lifestyle changes to control high blood pressure  Intervention  Effect on the BP  Lose extra pounds and watch your waistline Weight loss is one of the most effective lifestyle changes for controlling  blood pressure. If you're overweight or obese, losing even a small amount of weight can help reduce blood pressure. Blood pressure might go down by about 1 millimeter of mercury (mm Hg) with each kilogram (about 2.2 pounds) of weight lost.  Exercise regularly As a general goal, aim for at least 30 minutes of moderate physical activity every day. Regular physical activity can lower high blood pressure by about 5 to 8 mm Hg.  Eat a healthy diet Eating a diet rich in whole grains, fruits, vegetables, and low-fat dairy products and low in saturated fat and cholesterol. A healthy diet can lower high blood pressure by up to 11 mm Hg.  Reduce salt (sodium) in your diet Even a small reduction of sodium in the diet can improve heart health and reduce high blood pressure by about 5 to 6 mm Hg.  Limit alcohol One drink equals 12 ounces of beer, 5 ounces of wine, or 1.5 ounces of 80-proof liquor.  Limiting alcohol to less than one drink a day for women or two drinks a day for men can help lower blood pressure by about 4 mm Hg.   Please call me at [phone] with any questions.

## 2022-03-27 NOTE — Assessment & Plan Note (Signed)
Assessment: Blood pressure above goal of less than 130/80 Patient tolerating current medications okay Diet does appear to be higher in sodium, with fast food for lunch and canned vegetables for dinner Limited caffeine Reports walking 20 minutes about 5 times a week Patient previously on losartan but was stopped due to hyperkalemia.  At that time patient was not on any sort of diuretic.  She is now on HCTZ with a potassium of 3.9 on last check.  Plan: I think since patient is now on a diuretic which would decrease her potassium it would be okay to rechallenge with an ARB Start irbesartan 150 mg daily Continue amlodipine 5 mg daily hydrochlorothiazide 25 mg daily and metoprolol tartrate 25 mg twice a day Continue checking blood pressure at home Follow-up in clinic in 2 weeks for blood pressure check and BMP I have asked patient to bring her home blood pressure machine with her to next visit Decrease sodium intake Keep alcohol intake to only 1 drink or less per day

## 2022-03-27 NOTE — Telephone Encounter (Signed)
Prescription Request  03/27/2022   LOV: 12/31/2021  What is the name of the medication or equipment? methocarbamol (ROBAXIN) 500 MG tablet [id number]   Have you contacted your pharmacy to request a refill? Yes  Pharmacy asked pt to reach out to office for refill   Which pharmacy would you like this sent to?  [name] [id number]  [id number]: [phone]: [phone]     Patient notified that their request is being sent to the clinical staff for review and that they should receive a response within 2 business days.   Please advise at Mobile [phone](mobile)

## 2022-03-27 NOTE — Progress Notes (Signed)
Patient ID: Heather [name]                 DOB: [birth year]                      MRN: [id number]      HPI: Heather [name] is a 77 y.o. female patient of Dr. Elmarie Shiley referred by Mayra Reel, NP to HTN clinic. PMH is significant for bilateral carotid disease s/p right CEA 2017, hypertension, hyperlipidemia, CVA and chronic pain.  Patient was seen in the office by Venia Carbon, NP on 01/16/2022. At that time patient was hypertensive with BP reading >190/70 x 3 despite taking medications several hours prior to appointment. Amlodipine 5 mg was added. On follow up 02/13/22, BP was still 172/74 initial an 162/72 on recheck. Amlodipine was increased to '10mg'$ , however patient reported swelling and flushing with higher dose and decreased back to '5mg'$  daily.   Patient presents today to hypertension clinic.  She brings in a list of blood pressures averaging 145/64.  Swelling is much better on the lower dose of amlodipine.  Admits to a small amount of swelling in her ankle.  Patient states that sometimes the events of the world to get her upset and this raises her blood pressure.  She is checking her blood pressure first thing in the morning and then about 2 hours after she takes her medications.  Although she does live with chronic pain, both back and knee pain, she does try to walk about 20 minutes 5 days a week after lunch.  She does tend to eat fast food for lunch and canned vegetables which are not low-sodium.   Current HTN meds: amlodipine '5mg'$  daily, hydrochlorothiazide '25mg'$  daily, metoprolol tartrate '25mg'$  twice a day,  Previously tried: amlodipine '10mg'$  daily (swelling, flushing), losartan (hyperkalemia) BP goal: <130/80  Family History:  Family History  Adopted: Yes  Problem Relation Age of Onset   Osteoporosis Mother    Heart disease Mother    Hypertension Mother    Hyperlipidemia Mother    Stroke Mother    Arthritis Mother    Diabetes Father    Heart attack Father    Cancer Sister         breast   Colon cancer Neg Hx     Social History: 0-2 drinks per day, no tobacco  Diet: eats out for lunch Dinner: lots of vegetables- canned vegetables Breakfast: egg and sausage, grits, cereal Drinks: water, tea, rare soft drink  Exercise: walks about 20 min 5 days a week  Home BP readings:  systolic diastolic  '165 70  144 64  130 54  143 68  152 66  144 65  144 63  148 61  149 65  158 66  149 70  160 63  140 '$ 63  139 64  134 57  139 68  143 66  145.9412 64.29412    Wt Readings from Last 3 Encounters:  02/13/22 153 lb (69.4 kg)  01/16/22 152 lb 3.2 oz (69 kg)  12/31/21 151 lb (68.5 kg)   BP Readings from Last 3 Encounters:  03/27/22 (!) 156/60  02/13/22 (!) 162/70  01/16/22 (!) 190/86   Pulse Readings from Last 3 Encounters:  03/27/22 85  02/13/22 70  01/16/22 (!) 59    Renal function: CrCl cannot be calculated (Patient's most recent lab result is older than the maximum 21 days allowed.).  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 30 tablet 0   Ascorbic Acid (VITAMIN C) 1000 MG tablet Take 1,000 mg by mouth daily.     aspirin EC 81 MG tablet Take 1 tablet (81 mg total) by mouth daily.     atorvastatin (LIPITOR) 40 MG tablet TAKE 1 TABLET BY MOUTH DAILY AT 6 PM. 90 tablet 2   busPIRone (BUSPAR) 7.5 MG tablet TAKE 1 TABLET BY MOUTH 2 TIMES DAILY. 180 tablet 0   gabapentin (NEURONTIN) 300 MG capsule Take 300 mg by mouth every evening.     hydrochlorothiazide (HYDRODIURIL) 25 MG tablet Take 1 tablet (25 mg total) by mouth daily. 90 tablet 3   levocetirizine (XYZAL) 5 MG tablet TAKE 1 TABLET BY MOUTH EVERY DAY IN THE EVENING 90 tablet 1   lidocaine (XYLOCAINE) 2 % jelly Apply 1 application topically as needed. Apply to the skin over the knees and hips up to BID 30 mL 6   methocarbamol (ROBAXIN) 500 MG tablet TAKE 1 TABLET BY MOUTH EVERY 8 TO 12 HOURS AS NEEDED FOR MUSCLE SPASMS 40 tablet 3   metoprolol tartrate (LOPRESSOR) 25 MG tablet TAKE 1 TABLET BY MOUTH TWICE A DAY (Patient taking differently: Take 25 mg by mouth 2 (two) times daily.) 180 tablet 3   traMADol-acetaminophen (ULTRACET) 37.5-325 MG tablet TAKE 1 TABLET BY MOUTH EVERY 12 HOURS AS NEEDED 40 tablet 0   valACYclovir (VALTREX) 500 MG tablet TAKE 1 TABLET (500 MG TOTAL) BY MOUTH DAILY. 30 tablet 5   No current facility-administered medications on file prior to visit.    Allergies  Allergen Reactions   Sulfamethoxazole Swelling and Other (See Comments)    SWELLING REACTION UNSPECIFIED     Blood pressure (!) 156/60, pulse 85, last menstrual period 12/29/2011, SpO2 96 %.   Assessment/Plan:  1. Hypertension -  Hypertension Assessment: Blood pressure above goal of less than 130/80 Patient tolerating current medications okay Diet does appear to be higher in sodium, with fast food for lunch and canned vegetables for dinner Limited caffeine Reports walking 20 minutes about 5  times a week Patient previously on losartan but was stopped due to hyperkalemia.  At that time patient was not on any sort of diuretic.  She is now on HCTZ with a potassium of 3.9 on last check.  Plan: I think since patient  is now on a diuretic which would decrease her potassium it would be okay to rechallenge with an ARB Start irbesartan 150 mg daily Continue amlodipine 5 mg daily hydrochlorothiazide 25 mg daily and metoprolol tartrate 25 mg twice a day Continue checking blood pressure at home Follow-up in clinic in 2 weeks for blood pressure check and BMP I have asked patient to bring her home blood pressure machine with her to next visit Decrease sodium intake Keep alcohol intake to only 1 drink or less per day   Thank you  Ramond Dial, Pharm.D, BCPS, CPP [facility] of [facility] [address], [place],  [address]  Phone: [phone]; Fax: [phone]

## 2022-03-29 ENCOUNTER — Other Ambulatory Visit: Payer: Self-pay | Admitting: Cardiovascular Disease

## 2022-03-29 MED ORDER — METOPROLOL TARTRATE 25 MG PO TABS: 25.0000 mg | ORAL_TABLET | Freq: Two times a day (BID) | ORAL | 3 refills | Status: DC | PRN

## 2022-03-29 NOTE — Telephone Encounter (Signed)
Medication sent in. 

## 2022-03-29 NOTE — Addendum Note (Signed)
Addended by: Michela Pitcher on: 03/29/2022 04:26 PM   Modules accepted: Orders

## 2022-04-09 ENCOUNTER — Other Ambulatory Visit: Payer: Self-pay | Admitting: Nurse Practitioner

## 2022-04-10 ENCOUNTER — Ambulatory Visit: Payer: Medicare Other | Attending: Cardiovascular Disease | Admitting: Pharmacist

## 2022-04-10 VITALS — BP 146/70

## 2022-04-10 DIAGNOSIS — I10 Essential (primary) hypertension: Secondary | ICD-10-CM

## 2022-04-10 LAB — BASIC METABOLIC PANEL
BUN/Creatinine Ratio: 25 (ref 12–28)
BUN: 23 mg/dL (ref 8–27)
CO2: 25 mmol/L (ref 20–29)
Calcium: 10.6 mg/dL — ABNORMAL HIGH (ref 8.7–10.3)
Chloride: 101 mmol/L (ref 96–106)
Creatinine, Ser: 0.93 mg/dL (ref 0.57–1.00)
Glucose: 94 mg/dL (ref 70–99)
Potassium: 4.5 mmol/L (ref 3.5–5.2)
Sodium: 139 mmol/L (ref 134–144)
eGFR: 63 mL/min/{1.73_m2} (ref >59)

## 2022-04-10 NOTE — Progress Notes (Signed)
Patient ID: [name]                 DOB: [birth year]                      MRN: [id number]      HPI: Heather [name] is a 77 y.o. female patient of Dr. Elmarie [name] referred by Heather Reel, NP to HTN clinic. PMH is significant for bilateral carotid disease s/p right CEA 2017, hypertension, hyperlipidemia, CVA and chronic pain.  Patient was seen in the office by Venia Carbon, NP on 01/16/2022. At that time patient was hypertensive with BP reading >190/70 x 3 despite taking medications several hours prior to appointment. Amlodipine 5 mg was added. On follow up 02/13/22, BP was still 172/74 initial an 162/72 on recheck. Amlodipine was increased to '10mg'$ , however patient reported swelling and flushing with higher dose and decreased back to '5mg'$  daily.   Patient presents 03/27/2022 to hypertension clinic.  Home BP averaged 145/64.  Swelling was much better on the lower dose of amlodipine. Still had small amounts in her ankle.    She reported checking her blood pressure first thing in the morning and then about 2 hours after medications.  Reported trying to walk about 20 minutes 5 days a week after lunch.  Reported eating fast food for lunch and canned vegetables. Irbesartan '150mg'$  added.   In visit today, patient brought home readings and home cuff. Average home BP: 134/61. Reports small amounts of swelling from the knee down. Patient reports some dizziness that she believes occurred after starting irbesartan, happens in the morning. Patient is concerned about falling. Has been on irbesartan for around a week and a half. Denies lightheadedness, headaches. Endorses some blurry vision, happened once only in the right eye. Reports continuing to walk ~20 minutes after lunch, but feels as though she cannot walk as far as she used to. Reports trying to cut back on fast food and canned vegetables. Eats smaller portions. Home cuff and clinic readings were similar. BMP today.   Current HTN meds: amlodipine '5mg'$   daily, hydrochlorothiazide '25mg'$  daily, metoprolol tartrate '25mg'$  twice a day, irbesartan '150mg'$   Previously tried: amlodipine '10mg'$  daily (swelling, flushing), losartan (hyperkalemia) BP goal: <130/80  Family History:  Family History  Adopted: Yes  Problem Relation Age of Onset   Osteoporosis Mother    Heart disease Mother    Hypertension Mother    Hyperlipidemia Mother    Stroke Mother    Arthritis Mother    Diabetes Father    Heart attack Father    Cancer Sister        breast   Colon cancer Neg Hx     Social History: 0-2 drinks per day, no tobacco  Diet: eats out for lunch Dinner: lots of vegetables- canned vegetables Breakfast: egg and sausage, grits, cereal Drinks: water, tea, rare soft drink  Exercise: walks about 20 min 5 days a week  Home BP readings:  systolic diastolic  A999333 70  [id number] 62  132 65  137 69  139 65  118 56  132 62  144 63  126 49  138 61  127 61  141 53                 134.3333  61.3333    Wt Readings from Last 3 Encounters:  02/13/22 153 lb (69.4 kg)  01/16/22 152 lb 3.2 oz (69 kg)  12/31/21 151 lb (68.5 kg)  BP Readings from Last 3 Encounters:  04/10/22 (!) 146/70  03/27/22 (!) 156/60  02/13/22 (!) 162/70   Pulse Readings from Last 3 Encounters:  03/27/22 85  02/13/22 70  01/16/22 (!) 59    Renal function: CrCl cannot be calculated (Patient's most recent lab result is older than the maximum 21 days allowed.).  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 30 tablet 0   amLODipine (NORVASC) 5 MG tablet Take 1 tablet (5 mg total) by mouth daily. 90 tablet 3   Ascorbic Acid (VITAMIN C) 1000 MG tablet Take 1,000 mg by mouth daily.     aspirin EC 81 MG tablet Take 1 tablet (81 mg total) by mouth daily.     atorvastatin (LIPITOR) 40 MG tablet TAKE 1 TABLET BY MOUTH DAILY AT 6 PM. 90 tablet 2   busPIRone (BUSPAR) 7.5 MG tablet TAKE 1 TABLET BY MOUTH 2 TIMES DAILY. 180 tablet 0   gabapentin (NEURONTIN) 300 MG capsule Take 300 mg by mouth every evening.     hydrochlorothiazide (HYDRODIURIL) 25 MG tablet Take 1 tablet (25 mg total) by mouth daily. 90 tablet 3   irbesartan (AVAPRO) 150 MG tablet Take 1 tablet (150 mg total) by mouth daily. 90 tablet 3   levocetirizine (XYZAL) 5 MG tablet TAKE 1 TABLET BY MOUTH EVERY DAY IN THE EVENING 90 tablet 1   lidocaine (XYLOCAINE) 2 % jelly Apply 1 application topically as needed. Apply to the skin over the knees and hips up to BID 30 mL 6   methocarbamol (ROBAXIN) 500 MG tablet TAKE 1 TABLET BY MOUTH EVERY 8 TO 12 HOURS AS NEEDED FOR MUSCLE SPASMS 40 tablet 3   metoprolol tartrate (LOPRESSOR) 25 MG tablet Take 1 tablet (25 mg total) by mouth 2 (two) times daily as needed. 40 tablet 3   traMADol-acetaminophen (ULTRACET) 37.5-325 MG tablet TAKE 1 TABLET BY MOUTH EVERY 12 HOURS AS NEEDED 40 tablet 0   valACYclovir (VALTREX) 500 MG tablet TAKE 1 TABLET (500 MG TOTAL) BY MOUTH DAILY. 30 tablet  5   No current facility-administered medications on file prior to visit.    Allergies  Allergen Reactions   Sulfamethoxazole Swelling and Other (See Comments)    SWELLING REACTION UNSPECIFIED     Blood pressure (!) 146/70,  last menstrual period 12/29/2011.   Assessment/Plan:  1. Hypertension -  Hypertension Assessment:  Patient brought home readings and cuff to visit. Average BP 134/61.  Home cuff and clinic readings were similar.  BP today above goal.  Patient reports dizziness she believed occurred after starting irbesartan - concerned about falling. Has been on irbesartan for around a week and a half.  Discussed how sometimes starting a new medication can have side effects that decrease the longer you're taking it.  Endorsed one episode of blurry vision in the right eye.  Reports small amounts of swelling from the knee down  Plan:  Continue amlodipine '5mg'$  daily, hydrochlorothiazide '25mg'$  daily, metoprolol tartrate '25mg'$  twice a day, irbesartan '150mg'$  If continued dizziness, reach out so we can potentially adjust medications  Continue walking and working on decreasing fast food intake  I will follow-up with lab results from today  Follow-up Thursday, April 18th at 9:30 AM   Thank you,  Wallene Huh, PharmD Candidate   Ramond Dial, [place].D, BCPS, CPP [facility] Division of [facility] [address] [place], [place] [address]  Phone: [phone]; Fax: [phone]

## 2022-04-10 NOTE — Assessment & Plan Note (Signed)
Assessment:  Patient brought home readings and cuff to visit. Average BP 134/61.  Home cuff and clinic readings were similar.  BP today above goal.  Patient reports dizziness she believed occurred after starting irbesartan - concerned about falling. Has been on irbesartan for around a week and a half.  Discussed how sometimes starting a new medication can have side effects that decrease the longer you're taking it.  Endorsed one episode of blurry vision in the right eye.  Reports small amounts of swelling from the knee down  Plan:  Continue amlodipine '5mg'$  daily, hydrochlorothiazide '25mg'$  daily, metoprolol tartrate '25mg'$  twice a day, irbesartan '150mg'$  If continued dizziness, reach out so we can potentially adjust medications  Continue walking and working on decreasing fast food intake  I will follow-up with lab results from today  Follow-up Thursday, April 18th at 9:30 AM

## 2022-04-10 NOTE — Patient Instructions (Addendum)
Summary of today's discussion  1.Continue amlodipine '5mg'$  daily, hydrochlorothiazide '25mg'$  daily, metoprolol tartrate '25mg'$  twice a day, irbesartan '150mg'$     2.Continue checking blood pressure  3.Call me if dizziness doesn't improve  4. Continue walking daily  5.   Your blood pressure goal is <130/80  To check your pressure at home you will need to:  1. Sit up in a chair, with feet flat on the floor and back supported. Do not cross your ankles or legs. 2. Rest your left arm so that the cuff is about heart level. If the cuff goes on your upper arm,  then just relax the arm on the table, arm of the chair or your lap. If you have a wrist cuff, we  suggest relaxing your wrist against your chest (think of it as Pledging the Flag with the  wrong arm).  3. Place the cuff snugly around your arm, about 1 inch above the crook of your elbow. The  cords should be inside the groove of your elbow.  4. Sit quietly, with the cuff in place, for about 5 minutes. After that 5 minutes press the power  button to start a reading. 5. Do not talk or move while the reading is taking place.  6. Record your readings on a sheet of paper. Although most cuffs have a memory, it is often  easier to see a pattern developing when the numbers are all in front of you.  7. You can repeat the reading after 1-3 minutes if it is recommended  Make sure your bladder is empty and you have not had caffeine or tobacco within the last 30 min  Always bring your blood pressure log with you to your appointments. If you have not brought your monitor in to be double checked for accuracy, please bring it to your next appointment.  You can find a list of validated (accurate) blood pressure cuffs at [employer]   Important lifestyle changes to control high blood pressure  Intervention  Effect on the BP  Lose extra pounds and watch your waistline Weight loss is one of the most effective lifestyle changes for controlling blood  pressure. If you're overweight or obese, losing even a small amount of weight can help reduce blood pressure. Blood pressure might go down by about 1 millimeter of mercury (mm Hg) with each kilogram (about 2.2 pounds) of weight lost.  Exercise regularly As a general goal, aim for at least 30 minutes of moderate physical activity every day. Regular physical activity can lower high blood pressure by about 5 to 8 mm Hg.  Eat a healthy diet Eating a diet rich in whole grains, fruits, vegetables, and low-fat dairy products and low in saturated fat and cholesterol. A healthy diet can lower high blood pressure by up to 11 mm Hg.  Reduce salt (sodium) in your diet Even a small reduction of sodium in the diet can improve heart health and reduce high blood pressure by about 5 to 6 mm Hg.  Limit alcohol One drink equals 12 ounces of beer, 5 ounces of wine, or 1.5 ounces of 80-proof liquor.  Limiting alcohol to less than one drink a day for women or two drinks a day for men can help lower blood pressure by about 4 mm Hg.   Please call me at [phone] with any questions.

## 2022-04-11 ENCOUNTER — Telehealth: Payer: Self-pay

## 2022-04-11 NOTE — Telephone Encounter (Signed)
Called patient and advised labs are in normal limits. Continue medications as prescribed. Follow-up in April.

## 2022-04-17 ENCOUNTER — Other Ambulatory Visit: Payer: Self-pay

## 2022-04-17 ENCOUNTER — Ambulatory Visit (INDEPENDENT_AMBULATORY_CARE_PROVIDER_SITE_OTHER): Payer: Medicare Other

## 2022-04-17 VITALS — Ht 63.0 in | Wt 150.0 lb

## 2022-04-17 DIAGNOSIS — Z0000 Encounter for general adult medical examination without abnormal findings: Secondary | ICD-10-CM | POA: Diagnosis not present

## 2022-04-17 NOTE — Patient Instructions (Addendum)
Ms. Drennon , Thank you for taking time to come for your Medicare Wellness Visit. I appreciate your ongoing commitment to your health goals. Please review the following plan we discussed and let me know if I can assist you in the future.   These are the goals we discussed:  Goals      Patient Stated     Staying active.        This is a list of the screening recommended for you and due dates:  Health Maintenance  Topic Date Due   Zoster (Shingles) Vaccine (1 of 2) Never done   DEXA scan (bone density measurement)  Never done   DTaP/Tdap/Td vaccine (2 - Td or Tdap) 04/19/2020   Flu Shot  04/28/2022*   Medicare Annual Wellness Visit  04/17/2023   Pneumonia Vaccine  Completed   Hepatitis C Screening: USPSTF Recommendation to screen - Ages 18-79 yo.  Addressed   HPV Vaccine  Aged Out   COVID-19 Vaccine  Discontinued  *Topic was postponed. The date shown is not the original due date.   Opioid Pain Medicine Management Opioids are powerful medicines that are used to treat moderate to severe pain. When used for short periods of time, they can help you to: Sleep better. Do better in physical or occupational therapy. Feel better in the first few days after an injury. Recover from surgery. Opioids should be taken with the supervision of a trained health care provider. They should be taken for the shortest period of time possible. This is because opioids can be addictive, and the longer you take opioids, the greater your risk of addiction. This addiction can also be called opioid use disorder. What are the risks? Using opioid pain medicines for longer than 3 days increases your risk of side effects. Side effects include: Constipation. Nausea and vomiting. Breathing difficulties (respiratory depression). Drowsiness. Confusion. Opioid use disorder. Itching. Taking opioid pain medicine for a long period of time can affect your ability to do daily tasks. It also puts you at risk for: Motor  vehicle crashes. Depression. Suicide. Heart attack. Overdose, which can be life-threatening. What is a pain treatment plan? A pain treatment plan is an agreement between you and your health care provider. Pain is unique to each person, and treatments vary depending on your condition. To manage your pain, you and your health care provider need to work together. To help you do this: Discuss the goals of your treatment, including how much pain you might expect to have and how you will manage the pain. Review the risks and benefits of taking opioid medicines. Remember that a good treatment plan uses more than one approach and minimizes the chance of side effects. Be honest about the amount of medicines you take and about any drug or alcohol use. Get pain medicine prescriptions from only one health care provider. Pain can be managed with many types of alternative treatments. Ask your health care provider to refer you to one or more specialists who can help you manage pain through: Physical or occupational therapy. Counseling (cognitive behavioral therapy). Good nutrition. Biofeedback. Massage. Meditation. Non-opioid medicine. Following a gentle exercise program. How to use opioid pain medicine Taking medicine Take your pain medicine exactly as told by your health care provider. Take it only when you need it. If your pain gets less severe, you may take less than your prescribed dose if your health care provider approves. If you are not having pain, do nottake pain medicine unless your health  care provider tells you to take it. If your pain is severe, do nottry to treat it yourself by taking more pills than instructed on your prescription. Contact your health care provider for help. Write down the times when you take your pain medicine. It is easy to become confused while on pain medicine. Writing the time can help you avoid overdose. Take other over-the-counter or prescription medicines only as  told by your health care provider. Keeping yourself and others safe  While you are taking opioid pain medicine: Do not drive, use machinery, or power tools. Do not sign legal documents. Do not drink alcohol. Do not take sleeping pills. Do not supervise children by yourself. Do not do activities that require climbing or being in high places. Do not go to a lake, river, ocean, spa, or swimming pool. Do not share your pain medicine with anyone. Keep pain medicine in a locked cabinet or in a secure area where pets and children cannot reach it. Stopping your use of opioids If you have been taking opioid medicine for more than a few weeks, you may need to slowly decrease (taper) how much you take until you stop completely. Tapering your use of opioids can decrease your risk of symptoms of withdrawal, such as: Pain and cramping in the abdomen. Nausea. Sweating. Sleepiness. Restlessness. Uncontrollable shaking (tremors). Cravings for the medicine. Do not attempt to taper your use of opioids on your own. Talk with your health care provider about how to do this. Your health care provider may prescribe a step-down schedule based on how much medicine you are taking and how long you have been taking it. Getting rid of leftover pills Do not save any leftover pills. Get rid of leftover pills safely by: Taking the medicine to a prescription take-back program. This is usually offered by the county or law enforcement. Bringing them to a pharmacy that has a drug disposal container. Flushing them down the toilet. Check the label or package insert of your medicine to see whether this is safe to do. Throwing them out in the trash. Check the label or package insert of your medicine to see whether this is safe to do. If it is safe to throw it out, remove the medicine from the original container, put it into a sealable bag or container, and mix it with used coffee grounds, food scraps, dirt, or cat litter before  putting it in the trash. Follow these instructions at home: Activity Do exercises as told by your health care provider. Avoid activities that make your pain worse. Return to your normal activities as told by your health care provider. Ask your health care provider what activities are safe for you. General instructions You may need to take these actions to prevent or treat constipation: Drink enough fluid to keep your urine pale yellow. Take over-the-counter or prescription medicines. Eat foods that are high in fiber, such as beans, whole grains, and fresh fruits and vegetables. Limit foods that are high in fat and processed sugars, such as fried or sweet foods. Keep all follow-up visits. This is important. Where to find support If you have been taking opioids for a long time, you may benefit from receiving support for quitting from a local support group or counselor. Ask your health care provider for a referral to these resources in your area. Where to find more information Centers for Disease Control and Prevention (CDC): http://www.wolf.info/ [place] Food and Drug Administration (FDA): [employer] Get help right away if: You may  have taken too much of an opioid (overdosed). Common symptoms of an overdose: Your breathing is slower or more shallow than normal. You have a very slow heartbeat (pulse). You have slurred speech. You have nausea and vomiting. Your pupils become very small. You have other potential symptoms: You are very confused. You faint or feel like you will faint. You have cold, clammy skin. You have blue lips or fingernails. You have thoughts of harming yourself or harming others. These symptoms may represent a serious problem that is an emergency. Do not wait to see if the symptoms will go away. Get medical help right away. Call your local emergency services (911 in the [place] Do not drive yourself to the hospital.  If you ever feel like you may hurt yourself or others, or have  thoughts about taking your own life, get help right away. Go to your nearest emergency department or: Call your local emergency services (911 in the [place] Call the [facility] 267-279-9126 in the [place] Call a suicide crisis helpline, such as the [facility] at (445)517-8612 or 988 in the [place]. This is open 24 hours a day in the [place] Text the Crisis Text Line at 405-819-1209 (in the [place].). Summary Opioid medicines can help you manage moderate to severe pain for a short period of time. A pain treatment plan is an agreement between you and your health care provider. Discuss the goals of your treatment, including how much pain you might expect to have and how you will manage the pain. If you think that you or someone else may have taken too much of an opioid, get medical help right away. This information is not intended to replace advice given to you by your health care provider. Make sure you discuss any questions you have with your health care provider. Document Revised: 08/09/2020 Document Reviewed: 04/26/2020 Elsevier Patient Education  [place] directives: Advance directive discussed with you today. Even though you declined this today, please call our office should you change your mind, and we can give you the proper paperwork for you to fill out.   Conditions/risks identified: Aim for 30 minutes of exercise or brisk walking, 6-8 glasses of water, and 5 servings of fruits and vegetables each day.   Next appointment: Follow up in one year for your annual wellness visit 04/21/2023 @ 2:00 telephone visit.   Preventive Care 44 Years and Older, Female Preventive care refers to lifestyle choices and visits with your health care provider that can promote health and wellness. What does preventive care include? A yearly physical exam. This is also called an annual well check. Dental exams once or twice a year. Routine eye exams. Ask  your health care provider how often you should have your eyes checked. Personal lifestyle choices, including: Daily care of your teeth and gums. Regular physical activity. Eating a healthy diet. Avoiding tobacco and drug use. Limiting alcohol use. Practicing safe sex. Taking low-dose aspirin every day. Taking vitamin and mineral supplements as recommended by your health care provider. What happens during an annual well check? The services and screenings done by your health care provider during your annual well check will depend on your age, overall health, lifestyle risk factors, and family history of disease. Counseling  Your health care provider may ask you questions about your: Alcohol use. Tobacco use. Drug use. Emotional well-being. Home and relationship well-being. Sexual activity. Eating habits. History of falls. Memory and ability to understand (  cognition). Work and work Statistician. Reproductive health. Screening  You may have the following tests or measurements: Height, weight, and BMI. Blood pressure. Lipid and cholesterol levels. These may be checked every 5 years, or more frequently if you are over 66 years old. Skin check. Lung cancer screening. You may have this screening every year starting at age 25 if you have a 30-pack-year history of smoking and currently smoke or have quit within the past 15 years. Fecal occult blood test (FOBT) of the stool. You may have this test every year starting at age 31. Flexible sigmoidoscopy or colonoscopy. You may have a sigmoidoscopy every 5 years or a colonoscopy every 10 years starting at age 45. Hepatitis C blood test. Hepatitis B blood test. Sexually transmitted disease (STD) testing. Diabetes screening. This is done by checking your blood sugar (glucose) after you have not eaten for a while (fasting). You may have this done every 1-3 years. Bone density scan. This is done to screen for osteoporosis. You may have this done  starting at age 22. Mammogram. This may be done every 1-2 years. Talk to your health care provider about how often you should have regular mammograms. Talk with your health care provider about your test results, treatment options, and if necessary, the need for more tests. Vaccines  Your health care provider may recommend certain vaccines, such as: Influenza vaccine. This is recommended every year. Tetanus, diphtheria, and acellular pertussis (Tdap, Td) vaccine. You may need a Td booster every 10 years. Zoster vaccine. You may need this after age 59. Pneumococcal 13-valent conjugate (PCV13) vaccine. One dose is recommended after age 65. Pneumococcal polysaccharide (PPSV23) vaccine. One dose is recommended after age 59. Talk to your health care provider about which screenings and vaccines you need and how often you need them. This information is not intended to replace advice given to you by your health care provider. Make sure you discuss any questions you have with your health care provider. Document Released: 02/10/2015 Document Revised: 10/04/2015 Document Reviewed: 11/15/2014 Elsevier Interactive Patient Education  2017 [place] Prevention in the Home Falls can cause injuries. They can happen to people of all ages. There are many things you can do to make your home safe and to help prevent falls. What can I do on the outside of my home? Regularly fix the edges of walkways and driveways and fix any cracks. Remove anything that might make you trip as you walk through a door, such as a raised step or threshold. Trim any bushes or trees on the path to your home. Use bright outdoor lighting. Clear any walking paths of anything that might make someone trip, such as rocks or tools. Regularly check to see if handrails are loose or broken. Make sure that both sides of any steps have handrails. Any raised decks and porches should have guardrails on the edges. Have any leaves, snow, or  ice cleared regularly. Use sand or salt on walking paths during winter. Clean up any spills in your garage right away. This includes oil or grease spills. What can I do in the bathroom? Use night lights. Install grab bars by the toilet and in the tub and shower. Do not use towel bars as grab bars. Use non-skid mats or decals in the tub or shower. If you need to sit down in the shower, use a plastic, non-slip stool. Keep the floor dry. Clean up any water that spills on the floor as soon as it happens.  Remove soap buildup in the tub or shower regularly. Attach bath mats securely with double-sided non-slip rug tape. Do not have throw rugs and other things on the floor that can make you trip. What can I do in the bedroom? Use night lights. Make sure that you have a light by your bed that is easy to reach. Do not use any sheets or blankets that are too big for your bed. They should not hang down onto the floor. Have a firm chair that has side arms. You can use this for support while you get dressed. Do not have throw rugs and other things on the floor that can make you trip. What can I do in the kitchen? Clean up any spills right away. Avoid walking on wet floors. Keep items that you use a lot in easy-to-reach places. If you need to reach something above you, use a strong step stool that has a grab bar. Keep electrical cords out of the way. Do not use floor polish or wax that makes floors slippery. If you must use wax, use non-skid floor wax. Do not have throw rugs and other things on the floor that can make you trip. What can I do with my stairs? Do not leave any items on the stairs. Make sure that there are handrails on both sides of the stairs and use them. Fix handrails that are broken or loose. Make sure that handrails are as long as the stairways. Check any carpeting to make sure that it is firmly attached to the stairs. Fix any carpet that is loose or worn. Avoid having throw rugs at  the top or bottom of the stairs. If you do have throw rugs, attach them to the floor with carpet tape. Make sure that you have a light switch at the top of the stairs and the bottom of the stairs. If you do not have them, ask someone to add them for you. What else can I do to help prevent falls? Wear shoes that: Do not have high heels. Have rubber bottoms. Are comfortable and fit you well. Are closed at the toe. Do not wear sandals. If you use a stepladder: Make sure that it is fully opened. Do not climb a closed stepladder. Make sure that both sides of the stepladder are locked into place. Ask someone to hold it for you, if possible. Clearly mark and make sure that you can see: Any grab bars or handrails. First and last steps. Where the edge of each step is. Use tools that help you move around (mobility aids) if they are needed. These include: Canes. Walkers. Scooters. Crutches. Turn on the lights when you go into a dark area. Replace any light bulbs as soon as they burn out. Set up your furniture so you have a clear path. Avoid moving your furniture around. If any of your floors are uneven, fix them. If there are any pets around you, be aware of where they are. Review your medicines with your doctor. Some medicines can make you feel dizzy. This can increase your chance of falling. Ask your doctor what other things that you can do to help prevent falls. This information is not intended to replace advice given to you by your health care provider. Make sure you discuss any questions you have with your health care provider. Document Released: 11/10/2008 Document Revised: 06/22/2015 Document Reviewed: 02/18/2014 Elsevier Interactive Patient Education  2017 [employer].

## 2022-04-17 NOTE — Telephone Encounter (Signed)
Rx sent to provider

## 2022-04-17 NOTE — Telephone Encounter (Signed)
methocarbamol (ROBAXIN) 500 MG tablet  Last visit 12/31/2021 Next visit  07/03/2022

## 2022-04-17 NOTE — Telephone Encounter (Signed)
Patient called in to check on the status of this medication. On 3/1 it states that the medication was sent in however the pharmacy is saying that they do not have anything for this patient. Im not seeing that it was sent in as well. Please advise.

## 2022-04-17 NOTE — Progress Notes (Signed)
I connected with  Heather [name] on 04/17/22 by a audio enabled telemedicine application and verified that I am speaking with the correct person using two identifiers.  Patient Location: Home  Provider Location: Home Office  I discussed the limitations of evaluation and management by telemedicine. The patient expressed understanding and agreed to proceed.  Subjective:   Heather [name] is a 77 y.o. female who presents for Medicare Annual (Subsequent) preventive examination.  Review of Systems      Cardiac Risk Factors include: advanced age (>13men, >35 women);hypertension     Objective:    Today's Vitals   04/17/22 0934  Weight: 150 lb (68 kg)  Height: 5\' 3"  (1.6 m)   Body mass index is 26.57 kg/m.     04/17/2022    9:45 AM 05/26/2017    4:00 PM 05/21/2017   11:18 AM 02/06/2017   10:25 AM 12/17/2016    7:39 PM 08/16/2016   11:31 AM 10/20/2015   12:33 PM  Advanced Directives  Does Patient Have a Medical Advance Directive? No No No No No No No  Would patient like information on creating a medical advance directive? No - Patient declined Yes (Inpatient - patient requests chaplain consult to create a medical advance directive) Yes (MAU/Ambulatory/Procedural Areas - Information given) No - Patient declined No - Patient declined  No - patient declined information    Current Medications (verified) Outpatient Encounter Medications as of 04/17/2022  Medication Sig   acetaminophen (TYLENOL) 325 MG tablet Take 2 tablets (650 mg total) by mouth every 6 (six) hours as needed for moderate pain.   amLODipine (NORVASC) 5 MG tablet Take 1 tablet (5 mg total) by mouth daily.   aspirin EC 81 MG tablet Take 1 tablet (81 mg total) by mouth daily.   atorvastatin (LIPITOR) 40 MG tablet TAKE 1 TABLET BY MOUTH DAILY AT 6 PM.   busPIRone (BUSPAR) 7.5 MG tablet TAKE 1 TABLET BY MOUTH 2 TIMES DAILY.   gabapentin (NEURONTIN) 300 MG capsule Take 300 mg by mouth every evening.   hydrochlorothiazide  (HYDRODIURIL) 25 MG tablet Take 1 tablet (25 mg total) by mouth daily.   irbesartan (AVAPRO) 150 MG tablet Take 1 tablet (150 mg total) by mouth daily.   levocetirizine (XYZAL) 5 MG tablet TAKE 1 TABLET BY MOUTH EVERY DAY IN THE EVENING   lidocaine (XYLOCAINE) 2 % jelly Apply 1 application topically as needed. Apply to the skin over the knees and hips up to BID   metoprolol tartrate (LOPRESSOR) 25 MG tablet Take 1 tablet (25 mg total) by mouth 2 (two) times daily as needed.   traMADol-acetaminophen (ULTRACET) 37.5-325 MG tablet TAKE 1 TABLET BY MOUTH EVERY 12 HOURS AS NEEDED   valACYclovir (VALTREX) 500 MG tablet TAKE 1 TABLET (500 MG TOTAL) BY MOUTH DAILY.   Ascorbic Acid (VITAMIN C) 1000 MG tablet Take 1,000 mg by mouth daily. (Patient not taking: Reported on 04/17/2022)   methocarbamol (ROBAXIN) 500 MG tablet TAKE 1 TABLET BY MOUTH EVERY 8 TO 12 HOURS AS NEEDED FOR MUSCLE SPASMS (Patient not taking: Reported on 04/17/2022)   No facility-administered encounter medications on file as of 04/17/2022.    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   Orogenital transmission (husband had cold sore; HSV1 culture positive)   Shortness of breath dyspnea    WITH EXERTION     Spinal stenosis of lumbar region    Stroke [facility]) 04/2015   "light stroke; sometimes my thinking is slow since" (12/17/2016)   Wears glasses    Past Surgical  History:  Procedure Laterality Date   CARPAL TUNNEL RELEASE Right 03/31/2013   Procedure: RIGHT CARPAL TUNNEL RELEASE;  Surgeon: Wynonia Sours, MD;  Location: [place];  Service: Orthopedics;  Laterality: Right;   ENDARTERECTOMY Right 05/03/2015   Procedure: RIGHT CAROTID ENDARTERECTOMY ;  Surgeon: Conrad Malverne [place], MD;  Location: [place];  Service: Vascular;  Laterality: Right;   FEMUR FRACTURE SURGERY Right 1975   "hip"   FRACTURE SURGERY     LUMBAR LAMINECTOMY N/A 05/26/2017   Procedure: BILATERAL PARTIAL HEMILAMINECTOMIES L3-4 AND L4-5;  Surgeon: Jessy Oto, MD;  Location: [place];  Service: Orthopedics;  Laterality: N/A;   NASAL SINUS SURGERY  1975   PLANTAR FASCIA RELEASE Bilateral 1968   SHOULDER OPEN ROTATOR CUFF REPAIR Right 1998   TONSILLECTOMY  1951   TUBAL LIGATION  1995   Family History  Adopted: Yes  Problem Relation Age of Onset   Osteoporosis Mother    Heart disease Mother    Hypertension Mother    Hyperlipidemia Mother    Stroke Mother    Arthritis Mother    Diabetes Father    Heart attack Father    Cancer Sister        breast   Colon cancer Neg Hx    Social History   Socioeconomic History   Marital status: Divorced    Spouse name: Not on file   Number of children: 1   Years of education: 12   Highest education level: Not on file  Occupational History   Occupation: Retired  Tobacco Use   Smoking status: Never   Smokeless tobacco: Never  Scientific laboratory technician Use: Never used  Substance and Sexual Activity   Alcohol use: Not Currently    Alcohol/week: 3.0 standard drinks of alcohol    Types: 3 Cans of beer per week   Drug use: No   Sexual activity: Yes  Other Topics Concern   Not on file  Social History Narrative   Lives at home w/ significant other   Right-handed   Drinks 2 cups caffeine per day   Social Determinants of Health   Financial Resource Strain: Low Risk  (04/17/2022)   Overall Financial Resource Strain (CARDIA)     Difficulty of Paying Living Expenses: Not hard at all  Food Insecurity: No Food Insecurity (04/17/2022)   Hunger Vital Sign    Worried About Running Out of Food in the Last Year: Never true    Ran Out of Food in the Last Year: Never true  Transportation Needs: No Transportation Needs (04/17/2022)   PRAPARE - Hydrologist (Medical): No    Lack of Transportation (Non-Medical): No  Physical Activity: Sufficiently Active (  04/17/2022)   Exercise Vital Sign    Days of Exercise per Week: 7 days    Minutes of Exercise per Session: 30 min  Stress: No Stress Concern Present (04/17/2022)   [place]    Feeling of Stress : Not at all  Social Connections: Socially Isolated (04/17/2022)   Social Connection and Isolation Panel [NHANES]    Frequency of Communication with Friends and Family: More than three times a week    Frequency of Social Gatherings with Friends and Family: More than three times a week    Attends Religious Services: Never    Marine scientist or Organizations: No    Attends Music therapist: Never    Marital Status: Divorced    Tobacco Counseling Counseling given: Not Answered   Clinical Intake:  Pre-visit preparation completed: Yes  Pain : No/denies pain     Nutritional Risks: None Diabetes: No  How often do you need to have someone help you when you read instructions, pamphlets, or other written materials from your doctor or pharmacy?: 1 - Never  Diabetic? no  Interpreter Needed?: No  Information entered by :: C.Joelle Flessner LPN   Activities of Daily Living    04/17/2022    9:46 AM  In your present state of health, do you have any difficulty performing the following activities:  Hearing? 0  Vision? 0  Difficulty concentrating or making decisions? 0  Walking or climbing stairs? 0  Dressing or bathing? 0  Doing errands, shopping? 0  Preparing Food and  eating ? N  Using the Toilet? N  In the past six months, have you accidently leaked urine? Y  Comment Has discussed with PCP  Do you have problems with loss of bowel control? N  Managing your Medications? N  Managing your Finances? N  Housekeeping or managing your Housekeeping? N    Patient Care Team: Michela Pitcher, NP as PCP - General (Pain Medicine) Nahser, Wonda Cheng, MD as PCP - Cardiology (Cardiology) Conrad El Negro, MD as Consulting Physician (Vascular Surgery) Jessy Oto, MD (Inactive) as Consulting Physician (Orthopedic Surgery)  Indicate any recent Medical Services you may have received from other than [facility] providers in the past year (date may be approximate).     Assessment:   This is a routine wellness examination for [facility].  Hearing/Vision screen Hearing Screening - Comments:: No aids Vision Screening - Comments:: Glasses - Dr.Groat  Dietary issues and exercise activities discussed: Current Exercise Habits: Home exercise routine, Type of exercise: walking, Time (Minutes): 30, Frequency (Times/Week): 7, Weekly Exercise (Minutes/Week): 210, Intensity: Mild, Exercise limited by: None identified   Goals Addressed             This Visit's Progress    Patient Stated       Staying active.       Depression Screen    04/17/2022    9:48 AM 12/31/2021    8:50 AM 04/05/2021   10:30 AM 04/05/2021    9:30 AM 09/06/2020   12:57 PM 10/19/2019   10:26 AM 01/06/2018   10:47 AM  PHQ 2/9 Scores  PHQ - 2 Score 0 0 0 0 0 1 0  PHQ- 9 Score  3 0  0 1 0    Fall Risk    04/17/2022    9:46 AM 04/05/2021    9:30 AM 10/19/2019   10:25 AM 01/06/2018   10:47 AM 01/06/2018   10:46  AM  Fall Risk   Falls in the past year? 0 0 0 1 0  Number falls in past yr: 0 0  0   Injury with Fall? 0 0  0   Risk for fall due to : No Fall Risks      Follow up Falls prevention discussed;Falls evaluation completed        FALL RISK PREVENTION PERTAINING TO THE HOME:  Any stairs in or around the  home? No  If so, are there any without handrails? No  Home free of loose throw rugs in walkways, pet beds, electrical cords, etc? Yes  Adequate lighting in your home to reduce risk of falls? Yes   ASSISTIVE DEVICES UTILIZED TO PREVENT FALLS:  Life alert? No  Use of a cane, walker or w/c? Yes  Grab bars in the bathroom? No  Shower chair or bench in shower? No  Elevated toilet seat or a handicapped toilet? No   Cognitive Function:    02/26/2016    9:27 AM  MMSE - Mini Mental State Exam  Orientation to time 5  Orientation to Place 5  Registration 3  Attention/ Calculation 5  Recall 2  Language- name 2 objects 2  Language- repeat 1  Language- follow 3 step command 3  Language- read & follow direction 1  Write a sentence 1  Copy design 1  Total score 29        04/17/2022    9:55 AM  6CIT Screen  What Year? 0 points  What month? 0 points  What time? 0 points  Count back from 20 0 points  Months in reverse 0 points  Repeat phrase 0 points  Total Score 0 points    Immunizations Immunization History  Administered Date(s) Administered   Influenza,inj,Quad PF,6+ Mos 03/03/2013   Pneumococcal Conjugate-13 08/14/2015   Pneumococcal Polysaccharide-23 04/20/2010, 05/04/2015   Tdap 04/20/2010    TDAP status: Due, Education has been provided regarding the importance of this vaccine. Advised may receive this vaccine at local pharmacy or Health Dept. Aware to provide a copy of the vaccination record if obtained from local pharmacy or Health Dept. Verbalized acceptance and understanding.  Flu Vaccine status: Declined, Education has been provided regarding the importance of this vaccine but patient still declined. Advised may receive this vaccine at local pharmacy or Health Dept. Aware to provide a copy of the vaccination record if obtained from local pharmacy or Health Dept. Verbalized acceptance and understanding.  Pneumococcal vaccine status: Up to date  Covid-19 vaccine  status: Declined, Education has been provided regarding the importance of this vaccine but patient still declined. Advised may receive this vaccine at local pharmacy or Health Dept.or vaccine clinic. Aware to provide a copy of the vaccination record if obtained from local pharmacy or Health Dept. Verbalized acceptance and understanding.  Qualifies for Shingles Vaccine? Yes   Zostavax completed No   Shingrix Completed?: No.    Education has been provided regarding the importance of this vaccine. Patient has been advised to call insurance company to determine out of pocket expense if they have not yet received this vaccine. Advised may also receive vaccine at local pharmacy or Health Dept. Verbalized acceptance and understanding.  Screening Tests Health Maintenance  Topic Date Due   Zoster Vaccines- Shingrix (1 of 2) Never done   DEXA SCAN  Never done   DTaP/Tdap/Td (2 - Td or Tdap) 04/19/2020   INFLUENZA VACCINE  04/28/2022 (Originally 08/28/2021)   Medicare Annual Wellness (AWV)  04/17/2023   Pneumonia Vaccine 62+ Years old  Completed   Hepatitis C Screening  Addressed   HPV VACCINES  Aged Out   COVID-19 Vaccine  Discontinued    Health Maintenance  Health Maintenance Due  Topic Date Due   Zoster Vaccines- Shingrix (1 of 2) Never done   DEXA SCAN  Never done   DTaP/Tdap/Td (2 - Td or Tdap) 04/19/2020    Colorectal cancer screening: No longer required.  Pt Declines.  Mammogram status: No longer required due to age. Pt declined.  Bone Scan - Declined  Lung Cancer Screening: (Low Dose CT Chest recommended if Age 25-80 years, 30 pack-year currently smoking OR have quit w/in 15years.) does not qualify.   Lung Cancer Screening Referral: no  Additional Screening:  Hepatitis C Screening: does not qualify; Completed 08/14/15  Vision Screening: Recommended annual ophthalmology exams for early detection of glaucoma and other disorders of the eye. Is the patient up to date with their  annual eye exam?  No  Who is the provider or what is the name of the office in which the patient attends annual eye exams? Pt will schedule appt. With Dr.Groat If pt is not established with a provider, would they like to be referred to a provider to establish care? No .   Dental Screening: Recommended annual dental exams for proper oral hygiene  Community Resource Referral / Chronic Care Management: CRR required this visit?  No   CCM required this visit?  No      Plan:     I have personally reviewed and noted the following in the patient's chart:   Medical and social history Use of alcohol, tobacco or illicit drugs  Current medications and supplements including opioid prescriptions. Patient is currently taking opioid prescriptions. Information provided to patient regarding non-opioid alternatives. Patient advised to discuss non-opioid treatment plan with their provider. Functional ability and status Nutritional status Physical activity Advanced directives List of other physicians Hospitalizations, surgeries, and ER visits in previous 12 months Vitals Screenings to include cognitive, depression, and falls Referrals and appointments  In addition, I have reviewed and discussed with patient certain preventive protocols, quality metrics, and best practice recommendations. A written personalized care plan for preventive services as well as general preventive health recommendations were provided to patient.     Lebron Conners, LPN   [id number]   Nurse Notes: Vaccinations: declines all Influenza vaccine: recommend every Fall Pneumococcal vaccine: recommend once per lifetime Prevnar-20 Tdap vaccine: recommend every 10 years Shingles vaccine: recommend Shingrix which is 2 doses 2-6 months apart and over 90% effective     Covid-19: recommend 2 doses one month apart with a booster 6 months later  Pt declines mammogram, bone scan, and colonoscopy.

## 2022-04-18 MED ORDER — METHOCARBAMOL 500 MG PO TABS: ORAL_TABLET | ORAL | 3 refills | Status: DC

## 2022-04-19 ENCOUNTER — Other Ambulatory Visit: Payer: Self-pay | Admitting: Family

## 2022-04-19 NOTE — Telephone Encounter (Signed)
Last Refill: 10/19/21 ( 90 tabs/ 1 refill) Last visit 12/31/2021 Next visit  07/03/2022 Enough sent until next appt in June 2024.

## 2022-04-29 ENCOUNTER — Other Ambulatory Visit: Payer: Self-pay | Admitting: Nurse Practitioner

## 2022-04-29 ENCOUNTER — Other Ambulatory Visit: Payer: Self-pay | Admitting: Cardiovascular Disease

## 2022-05-01 ENCOUNTER — Telehealth: Payer: Self-pay | Admitting: Nurse Practitioner

## 2022-05-01 NOTE — Telephone Encounter (Signed)
Prescription Request  05/01/2022  LOV: 12/31/2021  What is the name of the medication or equipment? traMADol-acetaminophen (ULTRACET) 37.5-325 MG tablet   Have you contacted your pharmacy to request a refill? Yes   Which pharmacy would you like this sent to?  [id number] [address], [name] [place] [address] Phone: [phone] Fax: [phone]    Patient notified that their request is being sent to the clinical staff for review and that they should receive a response within 2 business days.   Please advise at [facility] [phone]

## 2022-05-01 NOTE — Telephone Encounter (Signed)
Prescription Request  05/01/2022  LOV: 12/31/2021  What is the name of the medication or equipment?  gabapentin (NEURONTIN) 300 MG capsule   methocarbamol (ROBAXIN) 500 MG tablet    Have you contacted your pharmacy to request a refill? Yes   Which pharmacy would you like this sent to?  [id number] [address], [place] [name] [place] [address] Phone: [phone] Fax: [phone]   Patient notified that their request is being sent to the clinical staff for review and that they should receive a response within 2 business days.   Please advise at Mobile [phone] (mobile)

## 2022-05-02 ENCOUNTER — Other Ambulatory Visit: Payer: Self-pay

## 2022-05-02 ENCOUNTER — Telehealth: Payer: Self-pay | Admitting: Pharmacist

## 2022-05-02 DIAGNOSIS — I10 Essential (primary) hypertension: Secondary | ICD-10-CM

## 2022-05-02 NOTE — Telephone Encounter (Signed)
gabapentin (NEURONTIN) 300 MG capsule  methocarbamol (ROBAXIN) 500 MG tablet  Last visit 12/31/2021 Next visit 07/03/2022

## 2022-05-02 NOTE — Telephone Encounter (Signed)
RX sent to provider

## 2022-05-02 NOTE — Telephone Encounter (Signed)
Medication was sent in this am

## 2022-05-02 NOTE — Telephone Encounter (Signed)
Patient called reporting she feels fery fatigued, legs weak, cramps in feet, pain for the last couple weeks Doesn't feel like getting up and going. Not like her. Dizziness in the AM 163/72, 129/59, 130/80, 135/63, 122/60, 112/53, [id number], [id number] Diastolic's [id number]  Headaches in the AM after taking medications 122/62 HR 54-63 Skipped irbesartan today and feels a little better Decrease metoprolol to 12.5mg  twice a day and irbesartan 75mg  daily. Come in for [facility] tomorrow.

## 2022-05-03 ENCOUNTER — Ambulatory Visit: Payer: Medicare Other | Attending: Cardiovascular Disease

## 2022-05-03 DIAGNOSIS — I10 Essential (primary) hypertension: Secondary | ICD-10-CM

## 2022-05-03 MED ORDER — GABAPENTIN 300 MG PO CAPS: 300.0000 mg | ORAL_CAPSULE | Freq: Every evening | ORAL | 1 refills | Status: DC

## 2022-05-03 MED ORDER — METHOCARBAMOL 500 MG PO TABS: ORAL_TABLET | ORAL | 3 refills | Status: DC

## 2022-05-04 LAB — BASIC METABOLIC PANEL
BUN/Creatinine Ratio: 22 (ref 12–28)
BUN: 21 mg/dL (ref 8–27)
CO2: 24 mmol/L (ref 20–29)
Calcium: 10.7 mg/dL — ABNORMAL HIGH (ref 8.7–10.3)
Chloride: 102 mmol/L (ref 96–106)
Creatinine, Ser: 0.94 mg/dL (ref 0.57–1.00)
Glucose: 99 mg/dL (ref 70–99)
Potassium: 4.2 mmol/L (ref 3.5–5.2)
Sodium: 141 mmol/L (ref 134–144)
eGFR: 62 mL/min/{1.73_m2} (ref >59)

## 2022-05-12 ENCOUNTER — Other Ambulatory Visit: Payer: Self-pay | Admitting: Cardiovascular Disease

## 2022-05-16 ENCOUNTER — Ambulatory Visit: Payer: Medicare Other | Attending: Cardiovascular Disease | Admitting: Pharmacist

## 2022-05-16 VITALS — BP 150/60 | HR 55

## 2022-05-16 DIAGNOSIS — I10 Essential (primary) hypertension: Secondary | ICD-10-CM | POA: Diagnosis not present

## 2022-05-16 MED ORDER — HYDROCHLOROTHIAZIDE 12.5 MG PO CAPS: 12.5000 mg | ORAL_CAPSULE | Freq: Every day | ORAL | 3 refills | Status: DC

## 2022-05-16 MED ORDER — IRBESARTAN 75 MG PO TABS: 75.0000 mg | ORAL_TABLET | Freq: Every day | ORAL | 3 refills | Status: DC

## 2022-05-16 NOTE — Assessment & Plan Note (Signed)
Assessment: Blood pressure is high in clinic today but patient has whitecoat syndrome. Home blood pressure readings are much better mainly 120s over 60s Patient still reporting dizziness after taking medications lasting about 30 minutes Blood pressures are higher in the afternoons which makes sense since she is consuming a significant amount of sodium at lunchtime.  We discussed packing her lunch instead of going out.  Reviewed daily sodium intake of no more than 2000 mg Patient continues to walk Calcium has been slightly elevated since increasing HCTZ Plan: Decrease HCTZ to 12.5 mg due to dizziness and elevated calcium Continue irbesartan 75 mg daily, amlodipine 5 mg daily and metoprolol tartrate 12.5 mg twice a day Can consider increasing irbesartan to 150 if blood pressure increases with HCTZ decrease Follow-up in clinic in 3 weeks.  Will check a BMP at that time. Decrease sodium intake

## 2022-05-16 NOTE — Progress Notes (Signed)
Patient ID: Heather [name]                 DOB: 08/31/1945                      MRN: [id number]      HPI: Heather [name] is a 77 y.o. female patient of Dr. Harvie Bridge referred by Carlos Levering, NP to HTN clinic. PMH is significant for bilateral carotid disease s/p right CEA 2017, hypertension, hyperlipidemia, CVA and chronic pain.  Patient was seen in the office by Wallis Bamberg, NP on 01/16/2022. At that time patient was hypertensive with BP reading >190/70 x 3 despite taking medications several hours prior to appointment. Amlodipine 5 mg was added. On follow up 02/13/22, BP was still 172/74 initial an 162/72 on recheck. Amlodipine was increased to , however patient reported swelling and flushing with higher dose and decreased back to  daily.   At visit 03/27/2022 patent's blood pressure was averaging 145/64.  Swelling had improved on amlodipine  daily. She reported checking her blood pressure first thing in the morning and then about 2 hours after medications.  Irbesartan  added.   At last visit with PharmD clinic, home BP was averaging 134/61. No changes were made. BMP was stable with the addition of irbesartan. No changes were made. Patient called the clinic 4/4 reporting that she felt very fatigued, leg weakness and cramps in her feet. Also reported dizziness in the AM. Metoprolol was decreased to 12.5mg  BID and irbesartan was decreased to  daily. She had her BMP check to access electrolytes. Ca mildly elevated, most likely due to HCTZ.   Patient presents today for follow-up.  She does report feeling better since medication changes, but still reports some dizziness.  Biggest complain is lack of energy and chronic pain.  She does try to continue to keep moving as sitting for long peers of time also causes her pain and she knows that movement is best.  Has pain both in her back and her knees.  Previously did physical therapy without much benefit.  Blood pressures that she brings  in today are very good in the 120s/60s.  These are blood pressure readings from the morning.  Still consuming a significant amount of sodium for lunch.  Her significant other likes to go weather deals and food is cheap, which means they eat at [employer], [employer], [employer]. I also think patient's blood pressure is also driven significantly by her emotions.  She gets upset with what she sees around her in today's culture/economy and political state.  Reports dizziness occurs maybe 15 minutes or so after taking her medication and last for about 30 minutes.  Current HTN meds: amlodipine  daily, hydrochlorothiazide  daily, metoprolol tartrate 12.5 mg twice a day, irbesartan   Previously tried: amlodipine  daily (swelling, flushing), losartan (hyperkalemia) BP goal: <130/80  Family History:  Family History  Adopted: Yes  Problem Relation Age of Onset   Osteoporosis Mother    Heart disease Mother    Hypertension Mother    Hyperlipidemia Mother    Stroke Mother    Arthritis Mother    Diabetes Father    Heart attack Father    Cancer Sister        breast   Colon cancer Neg Hx     Social History: 0-2 drinks per day, no tobacco  Diet: eats out for lunch Dinner: lots of vegetables- canned vegetables Breakfast: egg and sausage, grits, cereal Drinks:  water, tea, rare soft drink  Exercise: walks about 20 min 5 days a week  Home BP readings:  122 60  125 64  112 58  131 64  121 61  120 62  121.8333 61.5     Wt Readings from Last 3 Encounters:  04/17/22 150 lb (68 kg)  02/13/22 153 lb (69.4 kg)  01/16/22 152 lb 3.2 oz (69 kg)   BP Readings from Last 3 Encounters:  05/16/22 (!) 150/60  04/10/22 (!) 146/70  03/27/22 (!) 156/60   Pulse Readings from Last 3 Encounters:  05/16/22 (!) 55  03/27/22 85  02/13/22 70    Renal function: CrCl cannot be calculated (Unknown ideal weight.).  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 30 tablet 0   amLODipine (NORVASC) 5 MG tablet Take 1 tablet (5 mg total) by mouth daily. 90 tablet 3   Ascorbic Acid (VITAMIN C) 1000 MG tablet Take 1,000 mg by mouth daily. (Patient not taking: Reported on 04/17/2022)     aspirin EC 81 MG tablet Take 1 tablet (81 mg total) by mouth daily.     atorvastatin (LIPITOR) 40 MG tablet TAKE 1 TABLET BY MOUTH DAILY AT 6 PM. 90 tablet 2   busPIRone (BUSPAR) 7.5 MG tablet TAKE 1 TABLET BY MOUTH 2 TIMES DAILY. 180 tablet 0   gabapentin (NEURONTIN) 300 MG capsule Take 1 capsule (300 mg total) by mouth every evening. 90 capsule 1   levocetirizine (XYZAL) 5 MG tablet TAKE 1 TABLET BY MOUTH EVERY DAY IN THE EVENING 90 tablet 0   lidocaine (XYLOCAINE) 2 % jelly Apply 1 application topically as needed. Apply to the skin over the knees and hips up to BID 30 mL 6   methocarbamol (ROBAXIN) 500 MG tablet TAKE 1 TABLET BY MOUTH EVERY 8 TO 12 HOURS AS NEEDED FOR MUSCLE SPASMS 40 tablet 3   metoprolol tartrate  (LOPRESSOR) 25 MG tablet TAKE 1 TABLET BY MOUTH TWICE A DAY 180 tablet 3   traMADol-acetaminophen (ULTRACET) 37.5-325 MG tablet TAKE 1 TABLET BY MOUTH EVERY 12 HOURS AS NEEDED 40 tablet 0   valACYclovir (VALTREX) 500 MG tablet TAKE 1 TABLET (500 MG TOTAL) BY MOUTH DAILY. 30 tablet 5   No current facility-administered medications on file prior to visit.  Allergies  Allergen Reactions   Sulfamethoxazole Swelling and Other (See Comments)    SWELLING REACTION UNSPECIFIED     Blood pressure (!) 150/60, pulse (!) 55, last menstrual period 12/29/2011, SpO2 94 %.   Assessment/Plan:  1. Hypertension -  Hypertension Assessment: Blood pressure is high in clinic today but patient has whitecoat syndrome. Home blood pressure readings are much better mainly 120s over 60s Patient still reporting dizziness after taking medications lasting about 30 minutes Blood pressures are higher in the afternoons which makes sense since she is consuming a significant amount of sodium at lunchtime.  We discussed packing her lunch instead of going out.  Reviewed daily sodium intake of no more than 2000 mg Patient continues to walk Calcium has been slightly elevated since increasing HCTZ Plan: Decrease HCTZ to 12.5 mg due to dizziness and elevated calcium Continue irbesartan 75 mg daily, amlodipine 5 mg daily and metoprolol tartrate 12.5 mg twice a day Can consider increasing irbesartan to 150 if blood pressure increases with HCTZ decrease Follow-up in clinic in 3 weeks.  Will check a BMP at that time. Decrease sodium intake   Thank you,   Olene Floss, Pharm.D, BCPS, CPP [facility] A Division of  [facility] [address] [place], [place] [address]  Phone: [phone]; Fax: [phone]

## 2022-05-16 NOTE — Patient Instructions (Addendum)
Summary of today's discussion  1.Decrease hydrochlorothiazide to 12.5mg  daily  2.Continue Irbesartan  daily and metoprolol 12.5mg  twice a day  3.Continue checking blood pressure is AM and sometimes in the PM  4. Please work on decreasing sodium. Try packing lunch instead of going out  5.   Your blood pressure goal is <130/80  To check your pressure at home you will need to:  1. Sit up in a chair, with feet flat on the floor and back supported. Do not cross your ankles or legs. 2. Rest your left arm so that the cuff is about heart level. If the cuff goes on your upper arm,  then just relax the arm on the table, arm of the chair or your lap. If you have a wrist cuff, we  suggest relaxing your wrist against your chest (think of it as Pledging the Flag with the  wrong arm).  3. Place the cuff snugly around your arm, about 1 inch above the crook of your elbow. The  cords should be inside the groove of your elbow.  4. Sit quietly, with the cuff in place, for about 5 minutes. After that 5 minutes press the power  button to start a reading. 5. Do not talk or move while the reading is taking place.  6. Record your readings on a sheet of paper. Although most cuffs have a memory, it is often  easier to see a pattern developing when the numbers are all in front of you.  7. You can repeat the reading after 1-3 minutes if it is recommended  Make sure your bladder is empty and you have not had caffeine or tobacco within the last 30 min  Always bring your blood pressure log with you to your appointments. If you have not brought your monitor in to be double checked for accuracy, please bring it to your next appointment.  You can find a list of validated (accurate) blood pressure cuffs at [email or web address]   Important lifestyle changes to control high blood pressure  Intervention  Effect on the BP  Lose extra pounds and watch your waistline Weight loss is one of the most effective lifestyle  changes for controlling blood pressure. If you're overweight or obese, losing even a small amount of weight can help reduce blood pressure. Blood pressure might go down by about 1 millimeter of mercury (mm Hg) with each kilogram (about 2.2 pounds) of weight lost.  Exercise regularly As a general goal, aim for at least 30 minutes of moderate physical activity every day. Regular physical activity can lower high blood pressure by about 5 to 8 mm Hg.  Eat a healthy diet Eating a diet rich in whole grains, fruits, vegetables, and low-fat dairy products and low in saturated fat and cholesterol. A healthy diet can lower high blood pressure by up to 11 mm Hg.  Reduce salt (sodium) in your diet Even a small reduction of sodium in the diet can improve heart health and reduce high blood pressure by about 5 to 6 mm Hg.  Limit alcohol One drink equals 12 ounces of beer, 5 ounces of wine, or 1.5 ounces of 80-proof liquor.  Limiting alcohol to less than one drink a day for women or two drinks a day for men can help lower blood pressure by about 4 mm Hg.   Please call me at [phone] with any questions.

## 2022-05-22 ENCOUNTER — Other Ambulatory Visit: Payer: Self-pay | Admitting: Nurse Practitioner

## 2022-05-24 ENCOUNTER — Other Ambulatory Visit: Payer: Self-pay

## 2022-05-24 MED ORDER — TRAMADOL-ACETAMINOPHEN 37.5-325 MG PO TABS: 1.0000 | ORAL_TABLET | Freq: Two times a day (BID) | ORAL | 0 refills | Status: DC | PRN

## 2022-05-24 NOTE — Telephone Encounter (Signed)
Prescription Request  05/24/2022  LOV: 12/31/2021  What is the name of the medication or equipment? traMADol-acetaminophen (ULTRACET) 37.5-325 MG tablet [[id number]]   Have you contacted your pharmacy to request a refill? No   Which pharmacy would you like this sent to?  [id number] Judithann Sheen, [place] - [address] [place] [address] Phone: [phone] Fax: [phone]     Patient notified that their request is being sent to the clinical staff for review and that they should receive a response within 2 business days.   Please advise at Mobile [phone] (mobile)

## 2022-05-24 NOTE — Telephone Encounter (Signed)
traMADol-acetaminophen (ULTRACET) 37.5-325 MG tablet LOV 12/31/2021 NOV 07/03/2022 Pt states that she takes 2 a day and is out of the 40 that was sent on 05/02/2022

## 2022-05-24 NOTE — Telephone Encounter (Signed)
Left message to return call to our office.  

## 2022-05-26 ENCOUNTER — Other Ambulatory Visit: Payer: Self-pay | Admitting: Nurse Practitioner

## 2022-05-26 DIAGNOSIS — F32A Depression, unspecified: Secondary | ICD-10-CM

## 2022-06-06 ENCOUNTER — Ambulatory Visit: Payer: Medicare Other | Attending: Internal Medicine | Admitting: Pharmacist

## 2022-06-06 VITALS — BP 130/60 | HR 58

## 2022-06-06 DIAGNOSIS — I10 Essential (primary) hypertension: Secondary | ICD-10-CM

## 2022-06-06 LAB — BASIC METABOLIC PANEL
BUN/Creatinine Ratio: 23 (ref 12–28)
BUN: 22 mg/dL (ref 8–27)
CO2: 25 mmol/L (ref 20–29)
Calcium: 10.2 mg/dL (ref 8.7–10.3)
Chloride: 103 mmol/L (ref 96–106)
Creatinine, Ser: 0.97 mg/dL (ref 0.57–1.00)
Glucose: 108 mg/dL — ABNORMAL HIGH (ref 70–99)
Potassium: 5.2 mmol/L (ref 3.5–5.2)
Sodium: 139 mmol/L (ref 134–144)
eGFR: 60 mL/min/{1.73_m2} (ref >59)

## 2022-06-06 NOTE — Patient Instructions (Addendum)
Continue amlodipine 5mg  daily, hydrochlorothiazide 12.5mg  daily, metoprolol tartrate 12.5 mg twice a day, irbesartan 75mg    Please call me with any questions [phone]  Try to decrease fast food/sodium intake

## 2022-06-06 NOTE — Progress Notes (Signed)
Patient ID: Heather [name]                 DOB: 01-13-1946                      MRN: [id number]      HPI: Heather [name] is a 77 y.o. female patient of Dr. Harvie Bridge referred by Carlos Levering, NP to HTN clinic. PMH is significant for bilateral carotid disease s/p right CEA 2017, hypertension, hyperlipidemia, CVA and chronic pain.  Patient was seen in the office by Wallis Bamberg, NP on 01/16/2022. At that time patient was hypertensive with BP reading >190/70 x 3 despite taking medications several hours prior to appointment. Amlodipine 5 mg was added. On follow up 02/13/22, BP was still 172/74 initial an 162/72 on recheck. Amlodipine was increased to 10mg , however patient reported swelling and flushing with higher dose and decreased back to 5mg  daily.   At visit 03/27/2022 patent's blood pressure was averaging 145/64.  Swelling had improved on amlodipine 5mg  daily. She reported checking her blood pressure first thing in the morning and then about 2 hours after medications.  Irbesartan 150mg  added.   At visit 3/13 with PharmD clinic, home BP was averaging 134/61. No changes were made. BMP was stable with the addition of irbesartan. Patient called the clinic 4/4 reporting that she felt very fatigued, leg weakness and cramps in her feet. Also reported dizziness in the AM. Metoprolol was decreased to 12.5mg  BID and irbesartan was decreased to 75mg  daily. She had her BMP check to access electrolytes. Ca mildly elevated, most likely due to HCTZ.   She was last seen 4/18. Blood pressures from home in the AM were well controlled. She was still consuming a large amount of sodium at lunch, eating at many fast food restaurants. Dizziness in the AM about 15 min after taking medications. HCTZ was decreased to 12.5mg  due to elevated Ca and dizziness. She was asked to decrease sodium intake.  Patient presents today for follow-up.  She reports she still has some dizziness but it is improved.  Brings in list of blood  pressure readings, all of them in the a.m. except for 1.  Mainly in the 120s/50-60's with 2 and in the low 130s systolic.  Last night blood pressure was 160/70.  Still eating fast food for lunch.  Still trying to be as active as she can.  Current HTN meds: amlodipine 5mg  daily, hydrochlorothiazide 12.5mg  daily, metoprolol tartrate 12.5 mg twice a day, irbesartan 75mg   Previously tried: amlodipine 10mg  daily (swelling, flushing), losartan (hyperkalemia) BP goal: <130/80  Family History:  Family History  Adopted: Yes  Problem Relation Age of Onset   Osteoporosis Mother    Heart disease Mother    Hypertension Mother    Hyperlipidemia Mother    Stroke Mother    Arthritis Mother    Diabetes Father    Heart attack Father    Cancer Sister        breast   Colon cancer Neg Hx     Social History: 0-2 drinks per day, no tobacco  Diet: eats out for lunch Dinner: lots of vegetables- canned vegetables Breakfast: egg and sausage, grits, cereal Drinks: water, tea, rare soft drink  Exercise: walks about 20 min 5 days a week  Home BP readings:  118 59  120 60  129 55  126 56  138 67  120 65  116 57  139 68  162 70  Wt Readings from Last 3 Encounters:  04/17/22 150 lb (68 kg)  02/13/22 153 lb (69.4 kg)  01/16/22 152 lb 3.2 oz (69 kg)   BP Readings from Last 3 Encounters:  06/06/22 130/60  05/16/22 (!) 150/60  04/10/22 (!) 146/70   Pulse Readings from Last 3 Encounters:  06/06/22 (!) 58  05/16/22 (!) 55  03/27/22 85    Renal function: CrCl cannot be calculated (Patient's most recent lab result is older than the maximum 21 days allowed.).  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 30 tablet 0   amLODipine (NORVASC) 5 MG tablet Take 1 tablet (5 mg total) by mouth daily. 90 tablet 3   Ascorbic Acid (VITAMIN C) 1000 MG tablet Take 1,000 mg by mouth daily. (Patient not taking: Reported on 04/17/2022)     aspirin EC 81 MG tablet Take 1 tablet (81 mg total) by mouth daily.     atorvastatin (LIPITOR) 40 MG tablet TAKE 1 TABLET BY MOUTH DAILY AT 6 PM. 90 tablet 2   busPIRone (BUSPAR) 7.5 MG tablet TAKE 1 TABLET BY MOUTH TWICE A DAY 180 tablet 0   gabapentin (NEURONTIN) 300 MG capsule Take 1 capsule (300 mg total) by mouth every evening. 90 capsule 1   hydrochlorothiazide (MICROZIDE) 12.5 MG capsule Take 1 capsule (12.5 mg total) by mouth daily. 90 capsule 3   irbesartan (AVAPRO) 75 MG tablet Take 1 tablet (75 mg total) by mouth daily. 90 tablet 3   levocetirizine (XYZAL) 5 MG tablet TAKE 1 TABLET BY MOUTH EVERY DAY IN THE EVENING 90 tablet 0   lidocaine (XYLOCAINE) 2 % jelly Apply 1 application topically as needed. Apply to the skin over the knees and hips up to BID 30 mL 6   methocarbamol (ROBAXIN) 500 MG tablet TAKE 1 TABLET BY MOUTH EVERY 8 TO 12 HOURS AS NEEDED FOR MUSCLE SPASMS 40 tablet 3   metoprolol tartrate (LOPRESSOR) 25 MG tablet TAKE 1 TABLET BY MOUTH TWICE A DAY  180 tablet 3   traMADol-acetaminophen (ULTRACET) 37.5-325 MG tablet Take 1 tablet by mouth every 12 (twelve) hours as needed. 40 tablet 0   valACYclovir (VALTREX) 500 MG tablet TAKE 1 TABLET (500 MG TOTAL) BY MOUTH DAILY. 30 tablet 5   No current facility-administered medications on  file prior to visit.    Allergies  Allergen Reactions   Sulfamethoxazole Swelling and Other (See Comments)    SWELLING REACTION UNSPECIFIED     Blood pressure 130/60, pulse (!) 58, last menstrual period 12/29/2011, SpO2 97 %.   Assessment/Plan:  1. Hypertension -  Hypertension Assessment: Blood pressure at goal in clinic today Diastolic rather low Patient still consuming a lot of sodium with her afternoon meal Dizziness has improved Patient take blood pressure in a.m. and is mostly at goal less than 130/80.  With diastolic as low as it is would not want to increase any medication.  Plan: Continue amlodipine 5 mg daily, hydrochlorothiazide 12.5 mg daily, metoprolol to tartrate 12.5 mg twice a day and irbesartan 75mg  daily Encourage patient again to pack lunch and and try to avoid fast food which are often times quite high in sodium Follow-up as needed Repeat BMP today after decreasing HCTZ to see if calcium improved    Thank you,   Olene Floss, Pharm.D, BCPS, CPP [facility] A Division of [facility] [address] [place], [place] [address]  Phone: [phone]; Fax: [phone]

## 2022-06-06 NOTE — Assessment & Plan Note (Signed)
Assessment: Blood pressure at goal in clinic today Diastolic rather low Patient still consuming a lot of sodium with her afternoon meal Dizziness has improved Patient take blood pressure in a.m. and is mostly at goal less than 130/80.  With diastolic as low as it is would not want to increase any medication.  Plan: Continue amlodipine 5 mg daily, hydrochlorothiazide 12.5 mg daily, metoprolol to tartrate 12.5 mg twice a day and irbesartan 75mg  daily Encourage patient again to pack lunch and and try to avoid fast food which are often times quite high in sodium Follow-up as needed Repeat BMP today after decreasing HCTZ to see if calcium improved

## 2022-06-11 ENCOUNTER — Other Ambulatory Visit: Payer: Self-pay | Admitting: Nurse Practitioner

## 2022-06-19 ENCOUNTER — Other Ambulatory Visit: Payer: Self-pay | Admitting: Cardiovascular Disease

## 2022-06-19 NOTE — Telephone Encounter (Signed)
  The original prescription was discontinued on 05/16/2022 by Olene Floss, RPH-CPP

## 2022-06-23 ENCOUNTER — Other Ambulatory Visit: Payer: Self-pay | Admitting: Cardiovascular Disease

## 2022-07-03 ENCOUNTER — Encounter: Payer: Self-pay | Admitting: Nurse Practitioner

## 2022-07-03 ENCOUNTER — Ambulatory Visit (INDEPENDENT_AMBULATORY_CARE_PROVIDER_SITE_OTHER): Payer: Medicare Other | Admitting: Nurse Practitioner

## 2022-07-03 VITALS — BP 134/68 | HR 71 | Temp 98.0°F | Resp 16 | Ht 63.0 in | Wt 153.2 lb

## 2022-07-03 DIAGNOSIS — F419 Anxiety disorder, unspecified: Secondary | ICD-10-CM

## 2022-07-03 DIAGNOSIS — Z0000 Encounter for general adult medical examination without abnormal findings: Secondary | ICD-10-CM | POA: Diagnosis not present

## 2022-07-03 DIAGNOSIS — Z1211 Encounter for screening for malignant neoplasm of colon: Secondary | ICD-10-CM | POA: Diagnosis not present

## 2022-07-03 DIAGNOSIS — I10 Essential (primary) hypertension: Secondary | ICD-10-CM

## 2022-07-03 DIAGNOSIS — Z13820 Encounter for screening for osteoporosis: Secondary | ICD-10-CM

## 2022-07-03 DIAGNOSIS — R2232 Localized swelling, mass and lump, left upper limb: Secondary | ICD-10-CM

## 2022-07-03 DIAGNOSIS — F32A Depression, unspecified: Secondary | ICD-10-CM

## 2022-07-03 DIAGNOSIS — Z780 Asymptomatic menopausal state: Secondary | ICD-10-CM | POA: Diagnosis not present

## 2022-07-03 DIAGNOSIS — E7800 Pure hypercholesterolemia, unspecified: Secondary | ICD-10-CM | POA: Diagnosis not present

## 2022-07-03 LAB — CBC
HCT: 37.7 % (ref 36.0–46.0)
Hemoglobin: 12.7 g/dL (ref 12.0–15.0)
MCHC: 33.5 g/dL (ref 30.0–36.0)
MCV: 95.3 fl (ref 78.0–100.0)
Platelets: 309 10*3/uL (ref 150.0–400.0)
RBC: 3.96 Mil/uL (ref 3.87–5.11)
RDW: 13.1 % (ref 11.5–15.5)
WBC: 7.7 10*3/uL (ref 4.0–10.5)

## 2022-07-03 LAB — COMPREHENSIVE METABOLIC PANEL
ALT: 22 U/L (ref 0–35)
AST: 18 U/L (ref 0–37)
Albumin: 4.3 g/dL (ref 3.5–5.2)
Alkaline Phosphatase: 100 U/L (ref 39–117)
BUN: 22 mg/dL (ref 6–23)
CO2: 26 mEq/L (ref 19–32)
Calcium: 10.4 mg/dL (ref 8.4–10.5)
Chloride: 103 mEq/L (ref 96–112)
Creatinine, Ser: 0.95 mg/dL (ref 0.40–1.20)
GFR: 57.8 mL/min — ABNORMAL LOW (ref >60.00)
Glucose, Bld: 103 mg/dL — ABNORMAL HIGH (ref 70–99)
Potassium: 4.9 mEq/L (ref 3.5–5.1)
Sodium: 139 mEq/L (ref 135–145)
Total Bilirubin: 0.5 mg/dL (ref 0.2–1.2)
Total Protein: 6.6 g/dL (ref 6.0–8.3)

## 2022-07-03 LAB — LIPID PANEL
Cholesterol: 161 mg/dL (ref 0–200)
HDL: 65.4 mg/dL (ref >39.00)
LDL Cholesterol: 70 mg/dL (ref 0–99)
NonHDL: 95.22
Total CHOL/HDL Ratio: 2
Triglycerides: 126 mg/dL (ref 0.0–149.0)
VLDL: 25.2 mg/dL (ref 0.0–40.0)

## 2022-07-03 LAB — TSH: TSH: 2.45 u[IU]/mL (ref 0.35–5.50)

## 2022-07-03 NOTE — Assessment & Plan Note (Signed)
Patient currently maintained on atorvastatin 40 mg.  Has had a history of a carotid endarterectomy.  Pending lipid panel today.  Continue medication as prescribed

## 2022-07-03 NOTE — Assessment & Plan Note (Signed)
Discussed age-appropriate immunizations and screening exams.  Did review patient's personal, surgical, social, family histories.  Patient is up-to-date on age-appropriate immunizations that she would like.  Did recommend getting shingles vaccine at local pharmacy.  Did recommend getting tetanus vaccine at local pharmacy.  Patient has never done CRC screening she was amenable to Cologuard order placed today.  Patient is aged out for cervical cancer screening.  Patient no longer wants to pursue breast cancer screening.  Patient was given information at discharge about preventative healthcare maintenance with anticipatory guidance.

## 2022-07-03 NOTE — Assessment & Plan Note (Signed)
Patient currently maintained on buspirone 7.5 mg twice daily.  Continue taking medication as prescribed PHQ-9 and GAD-7 administered in office.

## 2022-07-03 NOTE — Assessment & Plan Note (Signed)
Patient currently maintained on amlodipine 5, hydrochlorothiazide 12.5, metoprolol 25, irbesartan 75.  Patient is still having intermittent dizziness is followed by cardiology and pharmacist at cardiologist office.  They recently reduced patient's HCTZ.  Continue taking medication as prescribed follow-up with cardiology and pharmacist as recommended

## 2022-07-03 NOTE — Patient Instructions (Signed)
Nice to see you today I will be in touch with the labs once I review them Follow up with me in 6 months for medication recheck

## 2022-07-03 NOTE — Progress Notes (Signed)
Established Patient Office Visit  Subjective   Patient ID: Heather [name], female    DOB: 07/28/1945  Age: 77 y.o. MRN: [id number]  Chief Complaint  Patient presents with   Annual Exam    HPI  HTN: on amlodipine and hctcz and irbesartan. States that she does have a cuff at home. States that she has been followed by cardiology. States that she does get dizzy sometimes after taking the medications   Anxiety and depression: She is on buspar   Chronic back pain: is followed by neurosurgery. States tha she is on tramadol, robaxin and gabapentin   for complete physical and follow up of chronic conditions.  Immunizations: -Tetanus: Completed in 2012 -Influenza: out of season  -Shingles: briefly discussed in office -Pneumonia: Completed  Covid: refused   Diet: Fair diet. 3 meals a day with some snacks like crackers. Coffee in the am, tea at lunch or coke, water  Exercise: No regular exercise. States that she walks but her legs will get tired   Eye exam:  wears glasses. Needs upating  Dental exam: needs updating  Colonoscopy: Completed in never. cologuard  Lung Cancer Screening: Completed in   Pap semar: aged out   Mammogram: refused   Dexa:  [place]        Review of Systems  Constitutional:  Negative for chills and fever.  Respiratory:  Negative for shortness of breath.   Cardiovascular:  Negative for chest pain and leg swelling.  Gastrointestinal:  Negative for abdominal pain, blood in stool, constipation, diarrhea, nausea and vomiting.       Every 2-3 days  Genitourinary:  Negative for dysuria and hematuria.  Neurological:  Negative for tingling and headaches.  Psychiatric/Behavioral:  Negative for hallucinations and suicidal ideas.       Objective:     BP 134/68   Pulse 71   Temp 98 F (36.7 C)   Resp 16   Ht 5\' 3"  (1.6 m)   Wt 153 lb 4 oz (69.5 kg)   LMP 12/29/2011   SpO2 98%   BMI 27.15 kg/m  BP Readings from Last 3 Encounters:  07/03/22  134/68  06/06/22 130/60  05/16/22 (!) 150/60   Wt Readings from Last 3 Encounters:  07/03/22 153 lb 4 oz (69.5 kg)  04/17/22 150 lb (68 kg)  02/13/22 153 lb (69.4 kg)      Physical Exam Vitals and nursing note reviewed.  Constitutional:      Appearance: Normal appearance.  HENT:     Right Ear: Tympanic membrane, ear canal and external ear normal.     Left Ear: Tympanic membrane, ear canal and external ear normal.     Mouth/Throat:     Mouth: Mucous membranes are moist.     Pharynx: Oropharynx is clear.  Eyes:     Extraocular Movements: Extraocular movements intact.     Pupils: Pupils are equal, round, and reactive to light.  Cardiovascular:     Rate and Rhythm: Normal rate and regular rhythm.     Pulses: Normal pulses.     Heart sounds: Normal heart sounds.  Pulmonary:     Effort: Pulmonary effort is normal.     Breath sounds: Normal breath sounds.  Abdominal:     General: Bowel sounds are normal. There is no distension.     Palpations: There is no mass.     Tenderness: There is no abdominal tenderness.     Hernia: No hernia is present.  Musculoskeletal:  Right lower leg: No edema.     Left lower leg: No edema.  Lymphadenopathy:     Cervical: No cervical adenopathy.  Skin:    General: Skin is warm.  Neurological:     General: No focal deficit present.     Mental Status: She is alert.     Deep Tendon Reflexes:     Reflex Scores:      Bicep reflexes are 2+ on the right side and 2+ on the left side.      Patellar reflexes are 2+ on the right side and 2+ on the left side.    Comments: Bilateral upper and lower extremity strength 5/5  Psychiatric:        Mood and Affect: Mood normal.        Behavior: Behavior normal.        Thought Content: Thought content normal.        Judgment: Judgment normal.      No results found for any visits on 07/03/22.    The ASCVD Risk score (Arnett DK, et al., 2019) failed to calculate for the following reasons:   The  patient has a prior MI or stroke diagnosis    Assessment & Plan:   Problem List Items Addressed This Visit       Cardiovascular and Mediastinum   Hypertension    Patient currently maintained on amlodipine 5, hydrochlorothiazide 12.5, metoprolol 25, irbesartan 75.  Patient is still having intermittent dizziness is followed by cardiology and pharmacist at cardiologist office.  They recently reduced patient's HCTZ.  Continue taking medication as prescribed follow-up with cardiology and pharmacist as recommended        Other   Anxiety and depression    Patient currently maintained on buspirone 7.5 mg twice daily.  Continue taking medication as prescribed PHQ-9 and GAD-7 administered in office.      Hyperlipidemia    Patient currently maintained on atorvastatin 40 mg.  Has had a history of a carotid endarterectomy.  Pending lipid panel today.  Continue medication as prescribed      Relevant Orders   Lipid panel   Mass of left forearm    Currently stable has not grown per patient report continue to monitor      Preventative health care - Primary    Discussed age-appropriate immunizations and screening exams.  Did review patient's personal, surgical, social, family histories.  Patient is up-to-date on age-appropriate immunizations that she would like.  Did recommend getting shingles vaccine at local pharmacy.  Did recommend getting tetanus vaccine at local pharmacy.  Patient has never done CRC screening she was amenable to Cologuard order placed today.  Patient is aged out for cervical cancer screening.  Patient no longer wants to pursue breast cancer screening.  Patient was given information at discharge about preventative healthcare maintenance with anticipatory guidance.      Relevant Orders   CBC   Comprehensive metabolic panel   TSH   Post-menopause    Patient needs screening for osteoporosis given orthopedic problems age and being postmenopausal DEXA scan ordered today.  Patient  was given information to call and set up      Relevant Orders   DG Bone Density   Other Visit Diagnoses     Screening for colon cancer       Relevant Orders   Cologuard   Screening for osteoporosis       Relevant Orders   DG Bone Density  Return in about 6 months (around 01/02/2023) for Medication recheck .    Audria Nine, NP

## 2022-07-03 NOTE — Assessment & Plan Note (Signed)
Patient needs screening for osteoporosis given orthopedic problems age and being postmenopausal DEXA scan ordered today.  Patient was given information to call and set up

## 2022-07-03 NOTE — Assessment & Plan Note (Signed)
Currently stable has not grown per patient report continue to monitor

## 2022-07-10 ENCOUNTER — Other Ambulatory Visit: Payer: Self-pay | Admitting: Nurse Practitioner

## 2022-07-10 NOTE — Telephone Encounter (Signed)
Patient called in to follow up on this refill. Informed her of the time frame. Thank you!

## 2022-08-07 ENCOUNTER — Other Ambulatory Visit: Payer: Self-pay | Admitting: Nurse Practitioner

## 2022-08-07 NOTE — Telephone Encounter (Signed)
LAST APPOINTMENT DATE: 07/03/2022   NEXT APPOINTMENT DATE: 01/02/2023   TRAMADOL 37.5-325 MG tablet LAST REFILL: 07/11/22  QTY: #60 0RF

## 2022-08-25 ENCOUNTER — Other Ambulatory Visit: Payer: Self-pay | Admitting: Nurse Practitioner

## 2022-08-25 DIAGNOSIS — F419 Anxiety disorder, unspecified: Secondary | ICD-10-CM

## 2022-08-26 NOTE — Telephone Encounter (Signed)
Per last note patient needs follow up in December. Patient has made appointment.

## 2022-08-28 ENCOUNTER — Telehealth: Payer: Self-pay | Admitting: Nurse Practitioner

## 2022-08-28 MED ORDER — LEVOCETIRIZINE DIHYDROCHLORIDE 5 MG PO TABS: ORAL_TABLET | ORAL | 1 refills | Status: AC

## 2022-08-28 NOTE — Telephone Encounter (Signed)
Rx sent 

## 2022-08-28 NOTE — Telephone Encounter (Signed)
Prescription Request  08/28/2022  LOV: 07/03/2022  What is the name of the medication or equipment? levocetirizine (XYZAL) 5 MG tablet   Have you contacted your pharmacy to request a refill? No   Which pharmacy would you like this sent to?  [id number] [place], [place] - [address] [place] [address] Phone: [phone] Fax: [phone]  Patient notified that their request is being sent to the clinical staff for review and that they should receive a response within 2 business days.   Please advise at Mobile [phone] (mobile)

## 2022-09-06 ENCOUNTER — Other Ambulatory Visit: Payer: Self-pay | Admitting: Nurse Practitioner

## 2022-09-11 ENCOUNTER — Other Ambulatory Visit: Payer: Self-pay | Admitting: Nurse Practitioner

## 2022-09-11 DIAGNOSIS — A6004 Herpesviral vulvovaginitis: Secondary | ICD-10-CM

## 2022-09-11 NOTE — Telephone Encounter (Signed)
LAST APPOINTMENT DATE: 07/03/22   NEXT APPOINTMENT DATE: 01/02/2023  valACYclovir 500mg    LAST REFILL: 08/15/22  QTY: #30 5RF

## 2022-09-19 ENCOUNTER — Other Ambulatory Visit: Payer: Self-pay | Admitting: Nurse Practitioner

## 2022-09-19 ENCOUNTER — Other Ambulatory Visit: Payer: Self-pay | Admitting: Cardiovascular Disease

## 2022-09-19 DIAGNOSIS — F32A Depression, unspecified: Secondary | ICD-10-CM

## 2022-10-08 ENCOUNTER — Other Ambulatory Visit: Payer: Self-pay | Admitting: Nurse Practitioner

## 2022-11-03 ENCOUNTER — Encounter: Payer: Self-pay | Admitting: Cardiovascular Disease

## 2022-11-03 NOTE — Progress Notes (Unsigned)
Cardiology Office Note   Date:  11/04/2022   ID:  Heather [name], Heather [name] [birth year], MRN [id number]  PCP:  Eden Emms, NP  Cardiologist:   Kristeen Miss, MD   Chief Complaint  Patient presents with   Hypertension        Hyperlipidemia     Problem List  1. Essential HTN 2 Carotid artery disease 3. Hyperlipidemia     Feb. 8, 2017 Heather [name] is a 77 y.o. female who presents for pre op evaluation prior to having carotid artery endarterectomy She has a history of mitral valve prolapse and has previously seen Dr. Aleen Campi. She's not seen a cardiologist for years. She has had some tachypalpitations in the past.  She's had some hyperlipidemia. She also has hypertension. She does not take her BP at home but thinks todays elevated BP is more due to white coat HTN  She has had some exertional chest tightness -  Last for several minutes ( or until she stops walking )  Has been going on for several months   Eats lots of processed foods and fast foods.     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  She has a history of hyperlipidemia. We started atorvastatin 40 mg a day. Her last lipid levels look very good.  Dec. 7, 2017:  Heather [name] is doing ok. Having back pain .   Was supposed to have back surgery earlier this year - had urgent carotid surgery instead ( and had a CVA as a complication of that )  Has some dyspnea.   Thinks its allergies.   February 05, 2016: Heather [name] is seen today for follow-up of a recent emergency room visit.  I saw Nikya in 2017 for preoperative carotid surgery.  She presented to the emergency room in November, 2018 with increasing shortness of breath and some chest discomfort. Is better from a respiratory / cardiology standpoint .   Needs to have back surgery.   Waiting on  clearance from Dr. Imogene Burn   She has a history of bilateral carotid artery disease.  She is status post right carotid endarterectomy in April, 2017.  Echo in Nov. 2018 showed normal LV function   April 21, 2020: Heather [name] is seen today for follow  Up for carotid artery disease. S/p right CEA in April 2017. She needs to have knee surgery .  BP is elevated today . Has not been checking her BP at home ,   Advised her to get a BP  Is not avoiding salt .  No CP ,  Is not exercising much because of knee pain   Echo in 2018 showed normal LV function   May 15, 2021 Heather [name] is seen for follow up of her carotid artery disease S/p R CEA in April 2017  Has had a URI since Feb was not COVID.    While she had the viral infection she had stopped her Lopressor temporarily.  She was having palpitations.  She learned that if she took in a deep breath and held for for 5 seconds that the palpitations would resolve. Her URI symptoms were eased with prednisone,     She has been started on hydrochlorothiazide 12.5 mg a day. Eats lots of processed meats   Echocardiogram from  2018 reveals normal left ventricular function.  She has mild aortic sclerosis.  Encouraged her to get regular exercise    Oct. 7, 2024 Heather [name] is seen for follow up of her carotid artery disease, s/p CEA withDr. Imogene Burn She reports having some palpitations. The palpitations will last several minutes Might occur daily or every other day   Had an abscessed tooth extracted last week Is not getting much exercise   She is very light headed about an hour after taking her morning meds.  Does not keep a BP log   Her LDL from June, 2024 is 70.      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   Orogenital transmission (husband had cold sore; HSV1 culture positive)   Shortness of breath dyspnea    WITH EXERTION     Spinal stenosis of lumbar region    Stroke [facility]) 04/2015   "light stroke; sometimes my thinking is slow since" (12/17/2016)   Wears glasses     Past Surgical History:  Procedure Laterality Date   CARPAL TUNNEL RELEASE Right 03/31/2013   Procedure: RIGHT CARPAL TUNNEL RELEASE;  Surgeon: Nicki Reaper, MD;  Location: [facility];  Service: Orthopedics;  Laterality: Right;   ENDARTERECTOMY Right 05/03/2015   Procedure: RIGHT CAROTID ENDARTERECTOMY ;  Surgeon: Fransisco Hertz, MD;  Location[facility] OR;  Service: Vascular;  Laterality: Right;   FEMUR FRACTURE SURGERY Right 1975   "hip"   FRACTURE SURGERY     LUMBAR LAMINECTOMY N/A 05/26/2017   Procedure: BILATERAL PARTIAL HEMILAMINECTOMIES L3-4 AND L4-5;  Surgeon: Kerrin Champagne, MD;  Location: [facility] OR;  Service: Orthopedics;  Laterality: N/A;   NASAL SINUS SURGERY  1975   PLANTAR FASCIA RELEASE Bilateral 1968   SHOULDER OPEN ROTATOR CUFF REPAIR Right 1998   TONSILLECTOMY  1951   TUBAL LIGATION  1995     Current Outpatient Medications  Medication Sig Dispense Refill   acetaminophen (TYLENOL) 325 MG tablet Take 2 tablets (650 mg total) by mouth every 6 (six) hours as needed for moderate pain. 30 tablet 0   amLODipine (NORVASC) 5 MG tablet Take 1 tablet (5 mg total) by mouth daily. 90 tablet 3   aspirin EC 81 MG tablet Take 1 tablet (81 mg total) by mouth daily.     atorvastatin (LIPITOR) 40 MG tablet TAKE 1 TABLET BY MOUTH DAILY AT 6 PM 90 tablet 2   busPIRone (BUSPAR) 7.5 MG tablet TAKE 1 TABLET BY MOUTH TWICE A DAY 180 tablet 0   gabapentin  (NEURONTIN) 300 MG capsule TAKE 1 CAPSULE BY MOUTH EVERY EVENING 90 capsule 1   hydrochlorothiazide (MICROZIDE) 12.5 MG capsule Take 1 capsule (12.5 mg total) by mouth daily. 90 capsule 3   irbesartan (AVAPRO) 75 MG tablet Take 1 tablet (75 mg total) by mouth daily.  90 tablet 3   levocetirizine (XYZAL) 5 MG tablet TAKE 1 TABLET BY MOUTH EVERY DAY IN THE EVENING 90 tablet 1   lidocaine (XYLOCAINE) 2 % jelly Apply 1 application topically as needed. Apply to the skin over the knees and hips up to BID 30 mL 6   methocarbamol (ROBAXIN) 500 MG tablet TAKE 1 TABLET BY MOUTH EVERY 8 TO 12 HOURS AS NEEDED FOR MUSCLE SPASMS 40 tablet 3   metoprolol tartrate (LOPRESSOR) 25 MG tablet TAKE 1 TABLET BY MOUTH TWICE A DAY 180 tablet 3   traMADol-acetaminophen (ULTRACET) 37.5-325 MG tablet TAKE 1 TABLET BY MOUTH EVERY 12 HOURS AS NEEDED 60 tablet 0   valACYclovir (VALTREX) 500 MG tablet TAKE 1 TABLET (500 MG TOTAL) BY MOUTH DAILY. 30 tablet 5   Ascorbic Acid (VITAMIN C) 1000 MG tablet Take 1,000 mg by mouth daily. (Patient not taking: Reported on 11/04/2022)     No current facility-administered medications for this visit.    Allergies:   Sulfamethoxazole    Social History:  The patient  reports that she has never smoked. She has never used smokeless tobacco. She reports that she does not currently use alcohol after a past usage of about 3.0 standard drinks of alcohol per week. She reports that she does not use drugs.   Family History:  The patient's family history includes Arthritis in her mother; Cancer in her sister; Diabetes in her father; Heart attack in her father; Heart disease in her mother; Hyperlipidemia in her mother; Hypertension in her mother; Osteoporosis in her mother; Stroke in her mother. She was adopted.    ROS: Noted in current history.  Otherwise all systems are negative   Physical Exam: Blood pressure 122/60, pulse 61, height 5\' 3"  (1.6 m), weight 145 lb 3.2 oz (65.9 kg), last menstrual  period 12/29/2011, SpO2 99%.       GEN:  Well nourished, well developed in no acute distress HEENT: Normal NECK: No JVD; No carotid bruits LYMPHATICS: No lymphadenopathy CARDIAC: RRR , no murmurs, rubs, gallops RESPIRATORY:  Clear to auscultation without rales, wheezing or rhonchi  ABDOMEN: Soft, non-tender, non-distended MUSCULOSKELETAL:  No edema; No deformity  SKIN: Warm and dry NEUROLOGIC:  Alert and oriented x 3     EKG:   EKG Interpretation Date/Time:  Monday November 04 2022 09:48:31 EDT Ventricular Rate:  61 PR Interval:  150 QRS Duration:  78 QT Interval:  402 QTC Calculation: 404 R Axis:   74  Text Interpretation: Sinus rhythm with Premature atrial complexes When compared with ECG of 16-Dec-2016 23:21, Premature atrial complexes are now Present Confirmed by Kristeen Miss ([id number]) on 11/04/2022 10:03:55 AM     Recent Labs: 07/03/2022: ALT 22; BUN 22; Creatinine, Ser 0.95; Hemoglobin 12.7; Platelets 309.0; Potassium 4.9; Sodium 139; TSH 2.45    Lipid Panel    Component Value Date/Time   CHOL 161 07/03/2022 1034   CHOL 170 04/21/2020 1011   TRIG 126.0 07/03/2022 1034   HDL 65.40 07/03/2022 1034   HDL 76 04/21/2020 1011   CHOLHDL 2 07/03/2022 1034   VLDL 25.2 07/03/2022 1034   LDLCALC 70 07/03/2022 1034   LDLCALC 74 04/21/2020 1011   LDLDIRECT 132.0 02/21/2015 1331      Wt Readings from Last 3 Encounters:  11/04/22 145 lb 3.2 oz (65.9 kg)  07/03/22 153 lb 4 oz (69.5 kg)  04/17/22 150 lb (68 kg)      Other studies Reviewed: Additional studies/ records that were reviewed today include: .  Review of the above records demonstrates:    ASSESSMENT AND PLAN:  1.  Carotid artery disease:  Her  last duplex scan was 2020.  She has had a carotid endarterectomy by Dr. Claudie Fisherman.  Will reschedule her carotid duplex scan at [facility].  2. Essential hypertension -   blood pressure seems to be well-controlled.  She describes having some dizziness about an hour after she  takes all of her morning medicines.  She is taking all of her blood pressure medicines in the early morning.  Will have her take the amlodipine at night which should help with his episodes of orthostatic   3. Hyperlipidemia -   last LDL is 70.  Continue current medications.   4.  Palpitations: She is describes having some palpitations.  Will order a 14-day event monitor for further evaluation.  Will see her back sooner if the event monitor shows any significant irregularities.  5.  PAD:  She is s/p R CEA.   Will have her repeat carotid duplex scan at vein and vascular specialist.    Current medicines are reviewed at length with the patient today.  The patient does not have concerns regarding medicines.  The following changes have been made:  no change  Labs/ tests ordered today include:   Orders Placed This Encounter  Procedures   EKG 12-Lead     Disposition:   I year with Dr. Cornell Barman, MD  11/04/2022 10:04 AM    [facility] [address] [place], [place], [place]  [address] Phone: [phone]; Fax: [phone]

## 2022-11-04 ENCOUNTER — Ambulatory Visit: Payer: Medicare Other

## 2022-11-04 ENCOUNTER — Encounter: Payer: Self-pay | Admitting: Cardiovascular Disease

## 2022-11-04 ENCOUNTER — Ambulatory Visit: Payer: Medicare Other | Attending: Cardiovascular Disease | Admitting: Cardiovascular Disease

## 2022-11-04 VITALS — BP 122/60 | HR 61 | Ht 63.0 in | Wt 145.2 lb

## 2022-11-04 DIAGNOSIS — Z09 Encounter for follow-up examination after completed treatment for conditions other than malignant neoplasm: Secondary | ICD-10-CM | POA: Diagnosis not present

## 2022-11-04 DIAGNOSIS — R002 Palpitations: Secondary | ICD-10-CM

## 2022-11-04 DIAGNOSIS — I6523 Occlusion and stenosis of bilateral carotid arteries: Secondary | ICD-10-CM | POA: Diagnosis not present

## 2022-11-04 MED ORDER — AMLODIPINE BESYLATE 5 MG PO TABS: 5.0000 mg | ORAL_TABLET | Freq: Every day | ORAL | Status: DC

## 2022-11-04 NOTE — Patient Instructions (Signed)
Medication Instructions:  Your physician has recommended you make the following change in your medication:  Change the time you take amlodipine 5 mg daily to bedtime  *If you need a refill on your cardiac medications before your next appointment, please call your pharmacy*   Lab Work: none If you have labs (blood work) drawn today and your tests are completely normal, you will receive your results only by: MyChart Message (if you have MyChart) OR A paper copy in the mail If you have any lab test that is abnormal or we need to change your treatment, we will call you to review the results.   Testing/Procedures: Your physician has requested that you have a carotid duplex. This test is an ultrasound of the carotid arteries in your neck. It looks at blood flow through these arteries that supply the brain with blood. Allow one hour for this exam. There are no restrictions or special instructions. To be done at [facility]  Dr Elease Hashimoto recommends you wear a 14 day zio monitor   Follow-Up: At [facility], you and your health needs are our priority.  As part of our continuing mission to provide you with exceptional heart care, we have created designated Provider Care Teams.  These Care Teams include your primary Cardiologist (physician) and Advanced Practice Providers (APPs -  Physician Assistants and Nurse Practitioners) who all work together to provide you with the care you need, when you need it.  We recommend signing up for the patient portal called "MyChart".  Sign up information is provided on this After Visit Summary.  MyChart is used to connect with patients for Virtual Visits (Telemedicine).  Patients are able to view lab/test results, encounter notes, upcoming appointments, etc.  Non-urgent messages can be sent to your provider as well.   To learn more about what you can do with MyChart, go to [email or web address].    Your next appointment:   12 month(s)  Provider:   Dr Wyatt Mage  Card or EP not listed click to update   DO NOT delete brackets or number around this link :1}   Other Instructions ZIO XT- Long Term Monitor Instructions  Your physician has requested you wear a ZIO patch monitor for 14 days.  This is a single patch monitor. [employer] supplies one patch monitor per enrollment. Additional stickers are not available. Please do not apply patch if you will be having a Nuclear Stress Test,  Echocardiogram, Cardiac CT, MRI, or Chest Xray during the period you would be wearing the  monitor. The patch cannot be worn during these tests. You cannot remove and re-apply the  ZIO XT patch monitor.  Your ZIO patch monitor will be mailed 3 day USPS to your address on file. It may take 3-5 days  to receive your monitor after you have been enrolled.  Once you have received your monitor, please review the enclosed instructions. Your monitor  has already been registered assigning a specific monitor serial # to you.  Billing and Patient Assistance Program Information  We have supplied [employer] with any of your insurance information on file for billing purposes. [employer] offers a sliding scale Patient Assistance Program for patients that do not have  insurance, or whose insurance does not completely cover the cost of the ZIO monitor.  You must apply for the Patient Assistance Program to qualify for this discounted rate.  To apply, please call [employer] at [phone], select option 4, select option 2, ask to apply for  Patient Assistance Program.  [employer] will ask your household income, and how many people  are in your household. They will quote your out-of-pocket cost based on that information.  [employer] will also be able to set up a 44-month, interest-free payment plan if needed.  Applying the monitor   Shave hair from upper left chest.  Hold abrader disc by orange tab. Rub abrader in 40 strokes over the upper left chest as  indicated in your monitor instructions.  Clean area  with 4 enclosed alcohol pads. Let dry.  Apply patch as indicated in monitor instructions. Patch will be placed under collarbone on left  side of chest with arrow pointing upward.  Rub patch adhesive wings for 2 minutes. Remove white label marked "1". Remove the white  label marked "2". Rub patch adhesive wings for 2 additional minutes.  While looking in a mirror, press and release button in center of patch. A small green light will  flash 3-4 times. This will be your only indicator that the monitor has been turned on.  Do not shower for the first 24 hours. You may shower after the first 24 hours.  Press the button if you feel a symptom. You will hear a small click. Record Date, Time and  Symptom in the Patient Logbook.  When you are ready to remove the patch, follow instructions on the last 2 pages of Patient  Logbook. Stick patch monitor onto the last page of Patient Logbook.  Place Patient Logbook in the blue and white box. Use locking tab on box and tape box closed  securely. The blue and white box has prepaid postage on it. Please place it in the mailbox as  soon as possible. Your physician should have your test results approximately 7 days after the  monitor has been mailed back to [facility].  Call [facility] Customer Care at [phone] if you have questions regarding  your ZIO XT patch monitor. Call them immediately if you see an orange light blinking on your  monitor.  If your monitor falls off in less than 4 days, contact our Monitor department at [phone].  If your monitor becomes loose or falls off after 4 days call [employer] at [phone] for  suggestions on securing your monitor

## 2022-11-04 NOTE — Progress Notes (Unsigned)
Enrolled for [employer] to mail a ZIO XT long term holter monitor to the patients address on file. Requested delivery of 11/11/22.

## 2022-11-05 ENCOUNTER — Telehealth: Payer: Self-pay | Admitting: Cardiovascular Disease

## 2022-11-05 ENCOUNTER — Other Ambulatory Visit: Payer: Self-pay | Admitting: Nurse Practitioner

## 2022-11-05 NOTE — Telephone Encounter (Signed)
Patient wants a call back to discuss the cost of getting her ZIO heart monitor.

## 2022-11-05 NOTE — Telephone Encounter (Signed)
Returned patient's call and provided the information she required, including the phone # for [employer] to obtain a quote for their services.

## 2022-11-07 ENCOUNTER — Ambulatory Visit (HOSPITAL_COMMUNITY)
Admission: RE | Admit: 2022-11-07 | Discharge: 2022-11-07 | Disposition: A | Discharge status: Home / Self Care | Payer: Medicare Other | Source: Ambulatory Visit | Attending: Cardiovascular Disease | Admitting: Cardiovascular Disease

## 2022-11-07 DIAGNOSIS — I6523 Occlusion and stenosis of bilateral carotid arteries: Secondary | ICD-10-CM | POA: Diagnosis present

## 2022-12-06 ENCOUNTER — Other Ambulatory Visit: Payer: Self-pay | Admitting: Nurse Practitioner

## 2023-01-01 ENCOUNTER — Other Ambulatory Visit: Payer: Self-pay | Admitting: Nurse Practitioner

## 2023-01-02 ENCOUNTER — Ambulatory Visit: Payer: Medicare Other | Admitting: Nurse Practitioner

## 2023-01-02 ENCOUNTER — Encounter: Payer: Self-pay | Admitting: Nurse Practitioner

## 2023-01-02 VITALS — BP 150/58 | HR 55 | Temp 98.3°F | Ht 63.0 in | Wt 147.0 lb

## 2023-01-02 DIAGNOSIS — R2241 Localized swelling, mass and lump, right lower limb: Secondary | ICD-10-CM | POA: Diagnosis not present

## 2023-01-02 DIAGNOSIS — M48062 Spinal stenosis, lumbar region with neurogenic claudication: Secondary | ICD-10-CM | POA: Diagnosis not present

## 2023-01-02 DIAGNOSIS — I10 Essential (primary) hypertension: Secondary | ICD-10-CM | POA: Diagnosis not present

## 2023-01-02 DIAGNOSIS — R21 Rash and other nonspecific skin eruption: Secondary | ICD-10-CM

## 2023-01-02 MED ORDER — TRIAMCINOLONE ACETONIDE 0.1 % EX CREA: 1.0000 | TOPICAL_CREAM | Freq: Two times a day (BID) | CUTANEOUS | 0 refills | Status: DC

## 2023-01-02 NOTE — Assessment & Plan Note (Signed)
[facility] did offer ultrasound of soft tissue see what it was.  Patient would like to hold off for now she can reach out at any point in time in order ultrasound of the left lower extremity soft tissue

## 2023-01-02 NOTE — Patient Instructions (Signed)
Nice to see you today Use the cream for 7-10 days twice a day Let me know when you want to get the ultrasound Follow up with me in 6 months for your physical, sooner if you need me

## 2023-01-02 NOTE — Progress Notes (Signed)
Established Patient Office Visit  Subjective   Patient ID: Heather [name], female    DOB: 1945/11/22  Age: 77 y.o. MRN: [id number]  Chief Complaint  Patient presents with   Follow-up    Medication recheck for tramadol and robaxin. Pt complains of still being in pain. States the medication helps a little.     HPI  Chornic back pain: patient has been followed by orthopedics in the past and ased if I would take over the medications. She is on robaxin and tramadol. States that she does get some relief and can notice when it is time for the second dose. States that she was  States that she will walk daily to stay activie  HTN: Patient currently followed by cardiology and maintained on irbesartan 75 mg, amlodipine 5 mg and hydrochlorothiazide 12.5 mg.  Rash: states that it iwill itch to the point of bleeding. She has treid hydrococrtisone 1% otc that has helped some.  She is also tried a skin oil this is been present for weeks.     Review of Systems  Constitutional:  Negative for chills and fever.  Respiratory:  Negative for shortness of breath.   Cardiovascular:  Negative for chest pain.  Gastrointestinal:  Negative for abdominal pain, constipation and diarrhea.  Musculoskeletal:  Positive for back pain and joint pain.  Neurological:  Negative for headaches.      Objective:     BP (!) 150/58   Pulse (!) 55   Temp 98.3 F (36.8 C) (Oral)   Ht 5\' 3"  (1.6 m)   Wt 147 lb (66.7 kg)   LMP 12/29/2011   SpO2 98%   BMI 26.04 kg/m  BP Readings from Last 3 Encounters:  01/02/23 (!) 150/58  11/04/22 122/60  07/03/22 134/68   Wt Readings from Last 3 Encounters:  01/02/23 147 lb (66.7 kg)  11/04/22 145 lb 3.2 oz (65.9 kg)  07/03/22 153 lb 4 oz (69.5 kg)   SpO2 Readings from Last 3 Encounters:  01/02/23 98%  11/04/22 99%  07/03/22 98%      Physical Exam Vitals and nursing note reviewed.  Constitutional:      Appearance: Normal appearance.  Cardiovascular:     Rate  and Rhythm: Normal rate and regular rhythm.     Heart sounds: Normal heart sounds.  Pulmonary:     Effort: Pulmonary effort is normal.     Breath sounds: Normal breath sounds.  Skin:      Neurological:     Mental Status: She is alert.      No results found for any visits on 01/02/23.    The ASCVD Risk score (Arnett DK, et al., 2019) failed to calculate for the following reasons:   The patient has a prior MI or stroke diagnosis    Assessment & Plan:   Problem List Items Addressed This Visit       Cardiovascular and Mediastinum   Hypertension    Patient currently maintained on irbesartan, hydrochlorothiazide, amlodipine.  Patient's blood pressure above goal today in office.  Previously blood pressures within goal continue taking medication follow-up with cardiology as recommended        Musculoskeletal and Integument   Rash - Primary    Will do triamcinolone 0.1% twice daily for 7 to 10 days.  Topical steroid precautions reviewed        Other   Lumbar stenosis with neurogenic claudication    Chronic back pain and knee pain patient is currently taking  Robaxin as needed along with tramadol 1 tab twice daily.  Patient is tolerating medication well seems to help with quality of life continue medication as prescribed      Mass of leg, right    [facility] did offer ultrasound of soft tissue see what it was.  Patient would like to hold off for now she can reach out at any point in time in order ultrasound of the left lower extremity soft tissue       Return in about 6 months (around 07/04/2023) for CPE and Labs.    Audria Nine, NP

## 2023-01-02 NOTE — Assessment & Plan Note (Signed)
Chronic back pain and knee pain patient is currently taking Robaxin as needed along with tramadol 1 tab twice daily.  Patient is tolerating medication well seems to help with quality of life continue medication as prescribed

## 2023-01-02 NOTE — Assessment & Plan Note (Signed)
Patient currently maintained on irbesartan, hydrochlorothiazide, amlodipine.  Patient's blood pressure above goal today in office.  Previously blood pressures within goal continue taking medication follow-up with cardiology as recommended

## 2023-01-02 NOTE — Assessment & Plan Note (Signed)
Will do triamcinolone 0.1% twice daily for 7 to 10 days.  Topical steroid precautions reviewed

## 2023-02-03 ENCOUNTER — Other Ambulatory Visit: Payer: Self-pay | Admitting: Nurse Practitioner

## 2023-02-10 ENCOUNTER — Other Ambulatory Visit: Payer: Self-pay

## 2023-02-10 MED ORDER — AMLODIPINE BESYLATE 5 MG PO TABS: 5.0000 mg | ORAL_TABLET | Freq: Every day | ORAL | 2 refills | Status: AC

## 2023-02-23 ENCOUNTER — Other Ambulatory Visit: Payer: Self-pay | Admitting: Nurse Practitioner

## 2023-02-23 DIAGNOSIS — F419 Anxiety disorder, unspecified: Secondary | ICD-10-CM

## 2023-02-27 ENCOUNTER — Other Ambulatory Visit: Payer: Self-pay | Admitting: Nurse Practitioner

## 2023-03-03 ENCOUNTER — Other Ambulatory Visit: Payer: Self-pay | Admitting: Nurse Practitioner

## 2023-03-03 DIAGNOSIS — A6004 Herpesviral vulvovaginitis: Secondary | ICD-10-CM

## 2023-03-20 ENCOUNTER — Other Ambulatory Visit: Payer: Self-pay | Admitting: Cardiovascular Disease

## 2023-03-21 ENCOUNTER — Other Ambulatory Visit: Payer: Self-pay | Admitting: Cardiovascular Disease

## 2023-04-02 ENCOUNTER — Other Ambulatory Visit: Payer: Self-pay | Admitting: Nurse Practitioner

## 2023-04-03 NOTE — Telephone Encounter (Signed)
 Called patient and not able to lvm due to mailbox full.

## 2023-04-03 NOTE — Telephone Encounter (Signed)
 Can we get patient scheduled for CPE on 07/04/23 or a little after

## 2023-04-04 NOTE — Telephone Encounter (Signed)
 Not able to lvm as patient mailbox is full.

## 2023-04-21 ENCOUNTER — Ambulatory Visit (INDEPENDENT_AMBULATORY_CARE_PROVIDER_SITE_OTHER): Payer: Medicare Other

## 2023-04-21 VITALS — BP 150/78 | Ht 63.0 in | Wt 143.0 lb

## 2023-04-21 DIAGNOSIS — Z0000 Encounter for general adult medical examination without abnormal findings: Secondary | ICD-10-CM | POA: Diagnosis not present

## 2023-04-21 DIAGNOSIS — Z2821 Immunization not carried out because of patient refusal: Secondary | ICD-10-CM

## 2023-04-21 NOTE — Progress Notes (Signed)
 Because this visit was a virtual/telehealth visit,  certain criteria was not obtained, such a blood pressure, CBG if applicable, and timed get up and go. Any medications not marked as "taking" were not mentioned during the medication reconciliation part of the visit. Any vitals not documented were not able to be obtained due to this being a telehealth visit or patient was unable to self-report a recent blood pressure reading due to a lack of equipment at home via telehealth. Vitals that have been documented are verbally provided by the patient.   Subjective:   Heather [name] is a 78 y.o. who presents for a Medicare Wellness preventive visit.  Visit Complete: Virtual I connected with  Heather [name] on 04/21/23 by a audio enabled telemedicine application and verified that I am speaking with the correct person using two identifiers.  Patient Location: Home  Provider Location: Home Office  I discussed the limitations of evaluation and management by telemedicine. The patient expressed understanding and agreed to proceed.  Vital Signs: Because this visit was a virtual/telehealth visit, some criteria may be missing or patient reported. Any vitals not documented were not able to be obtained and vitals that have been documented are patient reported.  VideoDeclined- This patient declined Librarian, academic. Therefore the visit was completed with audio only.  Persons Participating in Visit: Patient.  AWV Questionnaire: No: Patient Medicare AWV questionnaire was not completed prior to this visit.  Cardiac Risk Factors include: advanced age (>43men, >35 women);dyslipidemia     Objective:    Today's Vitals   04/21/23 1426 04/21/23 1433  BP: (!) 150/78   Weight: 143 lb (64.9 kg)   Height: 5\' 3"  (1.6 m)   PainSc:  9    Body mass index is 25.33 kg/m.     04/21/2023    2:33 PM 04/17/2022    9:45 AM 05/26/2017    4:00 PM 05/21/2017   11:18 AM 02/06/2017   10:25 AM  12/17/2016    7:39 PM 08/16/2016   11:31 AM  Advanced Directives  Does Patient Have a Medical Advance Directive? No No No No No No No  Would patient like information on creating a medical advance directive? No - Patient declined No - Patient declined Yes (Inpatient - patient requests chaplain consult to create a medical advance directive) Yes (MAU/Ambulatory/Procedural Areas - Information given) No - Patient declined No - Patient declined     Current Medications (verified) Outpatient Encounter Medications as of 04/21/2023  Medication Sig   acetaminophen (TYLENOL) 325 MG tablet Take 2 tablets (650 mg total) by mouth every 6 (six) hours as needed for moderate pain.   amLODipine (NORVASC) 5 MG tablet Take 1 tablet (5 mg total) by mouth at bedtime.   aspirin EC 81 MG tablet Take 1 tablet (81 mg total) by mouth daily.   atorvastatin (LIPITOR) 40 MG tablet TAKE 1 TABLET BY MOUTH DAILY AT 6 PM   busPIRone (BUSPAR) 7.5 MG tablet TAKE 1 TABLET BY MOUTH TWICE A DAY   gabapentin (NEURONTIN) 300 MG capsule TAKE 1 CAPSULE BY MOUTH EVERY DAY IN THE EVENING   hydrochlorothiazide (MICROZIDE) 12.5 MG capsule TAKE 1 CAPSULE BY MOUTH EVERY DAY   irbesartan (AVAPRO) 75 MG tablet Take 1 tablet (75 mg total) by mouth daily.   levocetirizine (XYZAL) 5 MG tablet TAKE 1 TABLET BY MOUTH EVERY DAY IN THE EVENING   lidocaine (XYLOCAINE) 2 % jelly Apply 1 application topically as needed. Apply to the skin over  the knees and hips up to BID   methocarbamol (ROBAXIN) 500 MG tablet TAKE 1 TABLET BY MOUTH EVERY 8 TO 12 HOURS AS NEEDED FOR MUSCLE SPASMS   metoprolol tartrate (LOPRESSOR) 25 MG tablet TAKE 1 TABLET BY MOUTH TWICE A DAY   traMADol-acetaminophen (ULTRACET) 37.5-325 MG tablet TAKE 1 TABLET BY MOUTH EVERY 12 HOURS AS NEEDED   triamcinolone cream (KENALOG) 0.1 % Apply 1 Application topically 2 (two) times daily.   valACYclovir (VALTREX) 500 MG tablet TAKE 1 TABLET (500 MG TOTAL) BY MOUTH DAILY.   Ascorbic Acid  (VITAMIN C) 1000 MG tablet Take 1,000 mg by mouth daily. (Patient not taking: Reported on 11/04/2022)   No facility-administered encounter medications on file as of 04/21/2023.    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   Orogenital transmission (husband had cold sore; HSV1 culture positive)   Shortness of breath dyspnea    WITH EXERTION     Spinal stenosis of lumbar region    Stroke [facility]) 04/2015   "light stroke; sometimes my thinking is slow since" (12/17/2016)   Wears glasses    Past Surgical History:  Procedure Laterality Date   CARPAL TUNNEL RELEASE Right 03/31/2013   Procedure: RIGHT CARPAL TUNNEL RELEASE;  Surgeon: Nicki Reaper, MD;  Location: [facility];  Service: Orthopedics;  Laterality: Right;   ENDARTERECTOMY Right 05/03/2015   Procedure: RIGHT CAROTID ENDARTERECTOMY ;  Surgeon: Fransisco Hertz, MD;  Location[facility] OR;  Service: Vascular;  Laterality: Right;   FEMUR FRACTURE SURGERY Right 1975   "hip"   FRACTURE SURGERY     LUMBAR LAMINECTOMY N/A 05/26/2017   Procedure: BILATERAL PARTIAL HEMILAMINECTOMIES L3-4 AND L4-5;  Surgeon: Kerrin Champagne, MD;  Location: [facility] OR;  Service: Orthopedics;  Laterality: N/A;   NASAL SINUS SURGERY  1975   PLANTAR FASCIA RELEASE Bilateral 1968   SHOULDER OPEN ROTATOR CUFF REPAIR Right 1998   TONSILLECTOMY  1951   TUBAL LIGATION  1995   Family History  Adopted: Yes   Problem Relation Age of Onset   Osteoporosis Mother    Heart disease Mother    Hypertension Mother    Hyperlipidemia Mother    Stroke Mother    Arthritis Mother    Diabetes Father    Heart attack Father    Cancer Sister        breast   Colon cancer Neg Hx    Social History   Socioeconomic History   Marital status: Divorced    Spouse name: Not on file   Number of children: 1   Years of education: 12   Highest education level: Not on file  Occupational History   Occupation: Retired  Tobacco Use   Smoking status: Never   Smokeless tobacco: Never  Advertising account planner  Vaping status: Never Used  Substance and Sexual Activity   Alcohol use: Not Currently    Alcohol/week: 3.0 standard drinks of alcohol    Types: 3 Cans of beer per week   Drug use: No   Sexual activity: Yes  Other Topics Concern   Not on file  Social History Narrative   Lives at home w/ significant other   Right-handed   Drinks 2 cups caffeine per day   Social Drivers of Health   Financial Resource Strain: Low Risk  (04/21/2023)   Overall Financial Resource Strain (CARDIA)    Difficulty of Paying Living Expenses: Not hard at all  Food Insecurity: No Food Insecurity (04/21/2023)   Hunger Vital Sign    Worried About Running Out of Food in the Last Year: Never true    Ran Out of Food in the Last Year: Never true  Transportation Needs: No Transportation Needs (04/21/2023)   PRAPARE - Administrator, Civil Service (Medical): No    Lack of Transportation (Non-Medical): No  Physical Activity: Sufficiently Active (04/21/2023)   Exercise Vital Sign    Days of Exercise per Week: 7 days    Minutes of Exercise per Session: 30 min  Stress: No Stress Concern Present (04/21/2023)   [employer] of Occupational Health - Occupational Stress Questionnaire    Feeling of Stress : Not at all  Social Connections: Socially Isolated (04/21/2023)   Social Connection and Isolation Panel [NHANES]    Frequency of  Communication with Friends and Family: More than three times a week    Frequency of Social Gatherings with Friends and Family: More than three times a week    Attends Religious Services: Never    Database administrator or Organizations: No    Attends Engineer, structural: Never    Marital Status: Divorced    Tobacco Counseling Counseling given: Not Answered    Clinical Intake:  Pre-visit preparation completed: Yes  Pain : 0-10 Pain Score: 9  Pain Type: Chronic pain Pain Location: Back Pain Orientation: Lower Pain Descriptors / Indicators: Constant Pain Onset: Today Pain Frequency: Constant Effect of Pain on Daily Activities: hurts to walk     BMI - recorded: 25.33 Nutritional Status: BMI 25 -29 Overweight Nutritional Risks: None Diabetes: No  Lab Results  Component Value Date   HGBA1C 5.8 02/21/2015   HGBA1C 5.9 03/03/2014   HGBA1C 5.8 03/03/2013     How often do you need to have someone help you when you read instructions, pamphlets, or other written materials from your doctor or pharmacy?: 1 - Never What is the last grade level you completed in school?: 12th grade  Interpreter Needed?: No  Information entered by :: Estell Harpin   Activities of Daily Living     04/21/2023    2:40 PM  In your present state of health, do you have any difficulty performing the following activities:  Hearing? 1  Comment left ear  Vision? 0  Difficulty concentrating or making decisions? 0  Walking or climbing stairs? 0  Dressing or bathing? 0  Doing errands, shopping? 0  Preparing Food and eating ? N  Using the Toilet? N  In the past six months, have you accidently leaked urine? Y  Do you have problems with loss of bowel control? N  Managing your Medications? N  Managing your Finances? N  Housekeeping or managing your Housekeeping? N    Patient Care Team: Eden Emms, NP as PCP - General (  Pain Medicine) Nahser, Deloris Ping, MD as PCP - Cardiology  (Cardiology) Fransisco Hertz, MD as Consulting Physician (Vascular Surgery) Kerrin Champagne, MD (Inactive) as Consulting Physician (Orthopedic Surgery)  Indicate any recent Medical Services you may have received from other than [facility] providers in the past year (date may be approximate).     Assessment:   This is a routine wellness examination for [facility].  Hearing/Vision screen Hearing Screening - Comments:: Yes her left ear gives her some issues Vision Screening - Comments:: Patient has glasses    Goals Addressed             This Visit's Progress    Patient Stated   On track    Staying active.       Depression Screen     04/21/2023    2:29 PM 01/02/2023    9:10 AM 07/03/2022   10:27 AM 04/17/2022    9:48 AM 12/31/2021    8:50 AM 04/05/2021   10:30 AM 04/05/2021    9:30 AM  PHQ 2/9 Scores  PHQ - 2 Score 0 0 0 0 0 0 0  PHQ- 9 Score 0 0 0  3 0     Fall Risk     04/21/2023    2:39 PM 01/02/2023    9:11 AM 04/17/2022    9:46 AM 04/05/2021    9:30 AM 10/19/2019   10:25 AM  Fall Risk   Falls in the past year? 0 0 0 0 0  Number falls in past yr: 0 0 0 0   Injury with Fall? 0 0 0 0   Risk for fall due to : History of fall(s) No Fall Risks No Fall Risks    Follow up Falls evaluation completed Falls evaluation completed Falls prevention discussed;Falls evaluation completed      MEDICARE RISK AT HOME:  Medicare Risk at Home Any stairs in or around the home?: No If so, are there any without handrails?: No Home free of loose throw rugs in walkways, pet beds, electrical cords, etc?: Yes Adequate lighting in your home to reduce risk of falls?: Yes Life [name]?: No Use of a cane, walker or w/c?: No Grab bars in the bathroom?: No Shower chair or bench in shower?: Yes (shower chair) Elevated toilet seat or a handicapped toilet?: No  TIMED UP AND GO:  Was the test performed?  No  Cognitive Function: 6CIT completed    02/26/2016    9:27 AM  MMSE - Mini Mental State Exam  Orientation  to time 5  Orientation to Place 5  Registration 3  Attention/ Calculation 5  Recall 2  Language- name 2 objects 2  Language- repeat 1  Language- follow 3 step command 3  Language- read & follow direction 1  Write a sentence 1  Copy design 1  Total score 29        04/21/2023    2:36 PM 04/17/2022    9:55 AM  6CIT Screen  What Year? 0 points 0 points  What month? 0 points 0 points  What time? 0 points 0 points  Count back from 20 2 points 0 points  Months in reverse 0 points 0 points  Repeat phrase 0 points 0 points  Total Score 2 points 0 points    Immunizations Immunization History  Administered Date(s) Administered   Influenza,inj,Quad PF,6+ Mos 03/03/2013   Pneumococcal Conjugate-13 08/14/2015   Pneumococcal Polysaccharide-23 04/20/2010, 05/04/2015   Tdap 04/20/2010    Screening Tests Health  Maintenance  Topic Date Due   Zoster Vaccines- Shingrix (1 of 2) Never done   DEXA SCAN  Never done   DTaP/Tdap/Td (2 - Td or Tdap) 04/19/2020   INFLUENZA VACCINE  04/28/2023 (Originally 08/29/2022)   Medicare Annual Wellness (AWV)  04/20/2024   Pneumonia Vaccine 19+ Years old  Completed   Hepatitis C Screening  Addressed   HPV VACCINES  Aged Out   COVID-19 Vaccine  Discontinued    Health Maintenance  Health Maintenance Due  Topic Date Due   Zoster Vaccines- Shingrix (1 of 2) Never done   DEXA SCAN  Never done   DTaP/Tdap/Td (2 - Td or Tdap) 04/19/2020   Health Maintenance Items Addressed: patient states she will get vaccines   Additional Screening:  Vision Screening: Recommended annual ophthalmology exams for early detection of glaucoma and other disorders of the eye.  Dental Screening: Recommended annual dental exams for proper oral hygiene  Community Resource Referral / Chronic Care Management: CRR required this visit?  No   CCM required this visit?  No     Plan:     I have personally reviewed and noted the following in the patient's chart:   Medical  and social history Use of alcohol, tobacco or illicit drugs  Current medications and supplements including opioid prescriptions. Patient is not currently taking opioid prescriptions. Functional ability and status Nutritional status Physical activity Advanced directives List of other physicians Hospitalizations, surgeries, and ER visits in previous 12 months Vitals Screenings to include cognitive, depression, and falls Referrals and appointments  In addition, I have reviewed and discussed with patient certain preventive protocols, quality metrics, and best practice recommendations. A written personalized care plan for preventive services as well as general preventive health recommendations were provided to patient.     Rudi Heap, [place]   04/21/2023   After Visit Summary: (MyChart) Due to this being a telephonic visit, the after visit summary with patients personalized plan was offered to patient via MyChart   Notes: Nothing significant to report at this time.

## 2023-04-21 NOTE — Patient Instructions (Signed)
 Ms. Janicki , Thank you for taking time to come for your Medicare Wellness Visit. I appreciate your ongoing commitment to your health goals. Please review the following plan we discussed and let me know if I can assist you in the future.   Referrals/Orders/Follow-Ups/Clinician Recommendations: Follow up with the vaccinations needed   This is a list of the screening recommended for you and due dates:  Health Maintenance  Topic Date Due   Zoster (Shingles) Vaccine (1 of 2) Never done   DEXA scan (bone density measurement)  Never done   DTaP/Tdap/Td vaccine (2 - Td or Tdap) 04/19/2020   Flu Shot  04/28/2023*   Medicare Annual Wellness Visit  04/20/2024   Pneumonia Vaccine  Completed   Hepatitis C Screening  Addressed   HPV Vaccine  Aged Out   COVID-19 Vaccine  Discontinued  *Topic was postponed. The date shown is not the original due date.    Advanced directives: (Declined) Advance directive discussed with you today. Even though you declined this today, please call our office should you change your mind, and we can give you the proper paperwork for you to fill out.  Next Medicare Annual Wellness Visit scheduled for next year: Yes

## 2023-05-06 ENCOUNTER — Other Ambulatory Visit: Payer: Self-pay | Admitting: Nurse Practitioner

## 2023-05-06 NOTE — Telephone Encounter (Signed)
 Needs a follow up appointment in 2 months, please schedule her

## 2023-05-06 NOTE — Telephone Encounter (Signed)
 Vm box was full, no mychart set up to send message

## 2023-05-07 NOTE — Telephone Encounter (Signed)
 Patient has been scheduled

## 2023-05-25 ENCOUNTER — Other Ambulatory Visit: Payer: Self-pay | Admitting: Nurse Practitioner

## 2023-05-25 DIAGNOSIS — F32A Depression, unspecified: Secondary | ICD-10-CM

## 2023-05-26 ENCOUNTER — Other Ambulatory Visit: Payer: Self-pay | Admitting: Cardiovascular Disease

## 2023-06-03 ENCOUNTER — Other Ambulatory Visit: Payer: Self-pay | Admitting: Nurse Practitioner

## 2023-07-02 ENCOUNTER — Other Ambulatory Visit: Payer: Self-pay | Admitting: Nurse Practitioner

## 2023-07-08 ENCOUNTER — Ambulatory Visit: Admitting: Nurse Practitioner

## 2023-07-16 ENCOUNTER — Other Ambulatory Visit: Payer: Self-pay

## 2023-07-16 ENCOUNTER — Emergency Department (HOSPITAL_COMMUNITY)
Admission: EM | Admit: 2023-07-16 | Discharge: 2023-07-16 | Disposition: A | Discharge status: Home / Self Care | Attending: Emergency Medicine | Admitting: Emergency Medicine

## 2023-07-16 ENCOUNTER — Encounter (HOSPITAL_COMMUNITY): Payer: Self-pay | Admitting: Emergency Medicine

## 2023-07-16 ENCOUNTER — Emergency Department (HOSPITAL_COMMUNITY)

## 2023-07-16 DIAGNOSIS — E860 Dehydration: Secondary | ICD-10-CM | POA: Insufficient documentation

## 2023-07-16 DIAGNOSIS — R55 Syncope and collapse: Secondary | ICD-10-CM

## 2023-07-16 DIAGNOSIS — Z7982 Long term (current) use of aspirin: Secondary | ICD-10-CM | POA: Diagnosis not present

## 2023-07-16 LAB — URINALYSIS, ROUTINE W REFLEX MICROSCOPIC
Bilirubin Urine: NEGATIVE
Glucose, UA: NEGATIVE mg/dL
Ketones, ur: NEGATIVE mg/dL
Leukocytes,Ua: NEGATIVE
Nitrite: NEGATIVE
Protein, ur: NEGATIVE mg/dL
Specific Gravity, Urine: 1.01 (ref 1.005–1.030)
pH: 7 (ref 5.0–8.0)

## 2023-07-16 LAB — CBC
HCT: 37.5 % (ref 36.0–46.0)
Hemoglobin: 12.5 g/dL (ref 12.0–15.0)
MCH: 32.4 pg (ref 26.0–34.0)
MCHC: 33.3 g/dL (ref 30.0–36.0)
MCV: 97.2 fL (ref 80.0–100.0)
Platelets: 288 10*3/uL (ref 150–400)
RBC: 3.86 MIL/uL — ABNORMAL LOW (ref 3.87–5.11)
RDW: 12.3 % (ref 11.5–15.5)
WBC: 13.3 10*3/uL — ABNORMAL HIGH (ref 4.0–10.5)
nRBC: 0 % (ref 0.0–0.2)

## 2023-07-16 LAB — BASIC METABOLIC PANEL WITH GFR
Anion gap: 8 (ref 5–15)
BUN: 23 mg/dL (ref 8–23)
CO2: 25 mmol/L (ref 22–32)
Calcium: 9.9 mg/dL (ref 8.9–10.3)
Chloride: 103 mmol/L (ref 98–111)
Creatinine, Ser: 1.13 mg/dL — ABNORMAL HIGH (ref 0.44–1.00)
GFR, Estimated: 50 mL/min — ABNORMAL LOW (ref >60)
Glucose, Bld: 111 mg/dL — ABNORMAL HIGH (ref 70–99)
Potassium: 4 mmol/L (ref 3.5–5.1)
Sodium: 136 mmol/L (ref 135–145)

## 2023-07-16 LAB — TROPONIN I (HIGH SENSITIVITY)
Troponin I (High Sensitivity): 9 ng/L (ref <18)
Troponin I (High Sensitivity): 9 ng/L (ref <18)

## 2023-07-16 NOTE — ED Triage Notes (Signed)
 Pt here from her working outside got overheated and a near syncopal episode , alert and oriented on arrival

## 2023-07-16 NOTE — Discharge Instructions (Signed)
 As discussed, your evaluation today has been largely reassuring.  But, it is important that you monitor your condition carefully, and do not hesitate to return to the ED if you develop new, or concerning changes in your condition. ? ?Otherwise, please follow-up with your physician for appropriate ongoing care. ? ?

## 2023-07-16 NOTE — ED Provider Notes (Signed)
 [place] EMERGENCY DEPARTMENT AT [facility] Provider Note   CSN: [id number] Arrival date & time: 07/16/23  1110     Patient presents with: Near Syncope   Heather [name] is a 78 y.o. female.   HPI Patient presents to syncope.  She was outside for a bit, given syncope and felt headache, while not resolved entirely. She was well prior to this, feels better    Prior to Admission medications   Medication Sig Start Date End Date Taking? Authorizing Provider  acetaminophen  (TYLENOL ) 325 MG tablet Take 2 tablets (650 mg total) by mouth every 6 (six) hours as needed for moderate pain. 03/13/21   Adolph Hoop, PA-C  amLODipine  (NORVASC ) 5 MG tablet Take 1 tablet (5 mg total) by mouth at bedtime. 02/10/23   Nahser, Lela Purple, MD  Ascorbic Acid  (VITAMIN C ) 1000 MG tablet Take 1,000 mg by mouth daily. Patient not taking: Reported on 11/04/2022    [provider]  aspirin  EC 81 MG tablet Take 1 tablet (81 mg total) by mouth daily. 03/08/15   Nahser, Lela Purple, MD  atorvastatin  (LIPITOR) 40 MG tablet TAKE 1 TABLET BY MOUTH DAILY AT 6 PM 03/21/23   Nahser, Lela Purple, MD  busPIRone  (BUSPAR ) 7.5 MG tablet TAKE 1 TABLET BY MOUTH TWICE A DAY 05/26/23   Dorothe Gaster, NP  gabapentin  (NEURONTIN ) 300 MG capsule TAKE 1 CAPSULE BY MOUTH EVERY DAY IN THE EVENING 04/03/23   Dorothe Gaster, NP  hydrochlorothiazide  (MICROZIDE ) 12.5 MG capsule TAKE 1 CAPSULE BY MOUTH EVERY DAY 03/21/23   Nahser, Lela Purple, MD  irbesartan  (AVAPRO ) 75 MG tablet TAKE 1 TABLET BY MOUTH EVERY DAY 05/27/23   Nahser, Lela Purple, MD  levocetirizine (XYZAL ) 5 MG tablet TAKE 1 TABLET BY MOUTH EVERY DAY IN THE EVENING 08/28/22   Dorothe Gaster, NP  lidocaine  (XYLOCAINE ) 2 % jelly Apply 1 application topically as needed. Apply to the skin over the knees and hips up to BID 07/03/16   Nitka, James E, MD  methocarbamol  (ROBAXIN ) 500 MG tablet TAKE 1 TABLET BY MOUTH EVERY 8 TO 12 HOURS AS NEEDED FOR MUSCLE SPASMS 02/27/23   Dorothe Gaster, NP   metoprolol  tartrate (LOPRESSOR ) 25 MG tablet TAKE 1 TABLET BY MOUTH TWICE A DAY 05/27/23   Nahser, Lela Purple, MD  traMADol -acetaminophen  (ULTRACET ) 37.5-325 MG tablet TAKE 1 TABLET BY MOUTH EVERY 12 HOURS AS NEEDED 07/02/23   Dorothe Gaster, NP  triamcinolone  cream (KENALOG ) 0.1 % APPLY TO AFFECTED AREA TWICE A DAY 06/04/23   Dorothe Gaster, NP  valACYclovir  (VALTREX ) 500 MG tablet TAKE 1 TABLET (500 MG TOTAL) BY MOUTH DAILY. 03/04/23   Dorothe Gaster, NP    Allergies: Sulfamethoxazole    Review of Systems  Updated Vital Signs BP (!) 144/57   Pulse 64   Temp 97.6 F (36.4 C) (Oral)   Resp 17   LMP 12/29/2011   SpO2 100%   Physical Exam Vitals and nursing note reviewed.  Constitutional:      General: She is not in acute distress.    Appearance: She is well-developed.  HENT:     Head: Normocephalic and atraumatic.   Eyes:     Conjunctiva/sclera: Conjunctivae normal.    Cardiovascular:     Rate and Rhythm: Normal rate and regular rhythm.  Pulmonary:     Effort: Pulmonary effort is normal. No respiratory distress.     Breath sounds: Normal breath sounds. No stridor.  Abdominal:  General: There is no distension.   Skin:    General: Skin is warm and dry.   Neurological:     Mental Status: She is alert and oriented to person, place, and time.     Cranial Nerves: No cranial nerve deficit.   Psychiatric:        Mood and Affect: Mood normal.     (all labs ordered are listed, but only abnormal results are displayed) Labs Reviewed  BASIC METABOLIC PANEL WITH GFR - Abnormal; Notable for the following components:      Result Value   Glucose, Bld 111 (*)    Creatinine, Ser 1.13 (*)    GFR, Estimated 50 (*)    All other components within normal limits  CBC - Abnormal; Notable for the following components:   WBC 13.3 (*)    RBC 3.86 (*)    All other components within normal limits  URINALYSIS, ROUTINE W REFLEX MICROSCOPIC - Abnormal; Notable for the following components:    Hgb urine dipstick SMALL (*)    Bacteria, UA RARE (*)    All other components within normal limits  TROPONIN I (HIGH SENSITIVITY)  TROPONIN I (HIGH SENSITIVITY)    EKG: EKG Interpretation Date/Time:  Wednesday July 16 2023 11:22:21 EDT Ventricular Rate:  60 PR Interval:  176 QRS Duration:  78 QT Interval:  414 QTC Calculation: 414 R Axis:   76  Text Interpretation: Normal sinus rhythm Nonspecific ST abnormality Abnormal ECG Confirmed by Dorenda Gandy [phone]) on 07/16/2023 11:35:46 AM  Radiology: Lenell Query Chest 2 View Result Date: 07/16/2023 CLINICAL DATA:  near  syncopal EXAM: CHEST - 2 VIEW COMPARISON:  12/16/2016 FINDINGS: Lungs are clear. Heart size and mediastinal contours are within normal limits. Aortic Atherosclerosis (ICD10-170.0). No effusion. Visualized bones unremarkable. IMPRESSION: No acute cardiopulmonary disease. Electronically Signed   By: Nicoletta Barrier M.D.   On: 07/16/2023 14:04     Procedures   Medications Ordered in the ED - No data to display                                  Medical Decision Making Adult female with episode of near syncope in the context of coming inside after some time outside doing work.  Patient is awake, alert, has no chest pain currently nor does she recall any before or afterwards.  Given these reassuring findings, lower suspicion for ACS, though arrhythmia is a consideration.  Other considerations include dehydration, neurologic phenomena.  Patient placed on monitors, fluids labs started. Cardiac 65 sinus normal pulse ox 100% room air normal  Amount and/or Complexity of Data Reviewed Labs: ordered. Decision-making details documented in ED Course. Radiology: ordered and independent interpretation performed. Decision-making details documented in ED Course. ECG/medicine tests: ordered and independent interpretation performed. Decision-making details documented in ED Course.   4:24 PM Patient in no distress, awake, alert, hemodynamically  unremarkable reviewed, discussed patient with mild dehydration, mild leukocytosis, but no overt evidence for infection, no fever, no hypotension evidence for bacteremia, sepsis.  We discussed close outpatient follow-up, return precautions, patient discharged in stable condition.     Final diagnoses:  Near syncope  Dehydration    ED Discharge Orders     None          Dorenda Gandy, MD 07/16/23 1624

## 2023-07-17 ENCOUNTER — Telehealth: Payer: Self-pay | Admitting: Nurse Practitioner

## 2023-07-17 NOTE — Telephone Encounter (Signed)
 Attempted to reach pt and discuss her concerns/complaint. Call ended without going to voicemail. Will attempt again.

## 2023-07-30 ENCOUNTER — Ambulatory Visit: Admitting: Nurse Practitioner

## 2023-07-30 ENCOUNTER — Encounter: Payer: Self-pay | Admitting: Nurse Practitioner

## 2023-07-30 VITALS — BP 138/60 | HR 60 | Temp 98.3°F | Ht 63.0 in | Wt 141.6 lb

## 2023-07-30 DIAGNOSIS — A6000 Herpesviral infection of urogenital system, unspecified: Secondary | ICD-10-CM | POA: Diagnosis not present

## 2023-07-30 DIAGNOSIS — F419 Anxiety disorder, unspecified: Secondary | ICD-10-CM

## 2023-07-30 DIAGNOSIS — Z131 Encounter for screening for diabetes mellitus: Secondary | ICD-10-CM

## 2023-07-30 DIAGNOSIS — E785 Hyperlipidemia, unspecified: Secondary | ICD-10-CM

## 2023-07-30 DIAGNOSIS — M48062 Spinal stenosis, lumbar region with neurogenic claudication: Secondary | ICD-10-CM

## 2023-07-30 DIAGNOSIS — R32 Unspecified urinary incontinence: Secondary | ICD-10-CM

## 2023-07-30 DIAGNOSIS — Z7189 Other specified counseling: Secondary | ICD-10-CM

## 2023-07-30 DIAGNOSIS — Z13820 Encounter for screening for osteoporosis: Secondary | ICD-10-CM

## 2023-07-30 DIAGNOSIS — F32A Depression, unspecified: Secondary | ICD-10-CM

## 2023-07-30 DIAGNOSIS — I10 Essential (primary) hypertension: Secondary | ICD-10-CM | POA: Diagnosis not present

## 2023-07-30 DIAGNOSIS — I779 Disorder of arteries and arterioles, unspecified: Secondary | ICD-10-CM

## 2023-07-30 DIAGNOSIS — Z0000 Encounter for general adult medical examination without abnormal findings: Secondary | ICD-10-CM

## 2023-07-30 LAB — CBC
HCT: 37.9 % (ref 36.0–46.0)
Hemoglobin: 12.8 g/dL (ref 12.0–15.0)
MCHC: 33.8 g/dL (ref 30.0–36.0)
MCV: 95 fl (ref 78.0–100.0)
Platelets: 309 10*3/uL (ref 150.0–400.0)
RBC: 3.99 Mil/uL (ref 3.87–5.11)
RDW: 12.9 % (ref 11.5–15.5)
WBC: 7.5 10*3/uL (ref 4.0–10.5)

## 2023-07-30 LAB — LIPID PANEL
Cholesterol: 154 mg/dL (ref 0–200)
HDL: 67.2 mg/dL (ref >39.00)
LDL Cholesterol: 68 mg/dL (ref 0–99)
NonHDL: 86.37
Total CHOL/HDL Ratio: 2
Triglycerides: 92 mg/dL (ref 0.0–149.0)
VLDL: 18.4 mg/dL (ref 0.0–40.0)

## 2023-07-30 LAB — COMPREHENSIVE METABOLIC PANEL WITH GFR
ALT: 19 U/L (ref 0–35)
AST: 16 U/L (ref 0–37)
Albumin: 4.3 g/dL (ref 3.5–5.2)
Alkaline Phosphatase: 103 U/L (ref 39–117)
BUN: 21 mg/dL (ref 6–23)
CO2: 29 meq/L (ref 19–32)
Calcium: 10.2 mg/dL (ref 8.4–10.5)
Chloride: 103 meq/L (ref 96–112)
Creatinine, Ser: 1.05 mg/dL (ref 0.40–1.20)
GFR: 50.88 mL/min — ABNORMAL LOW (ref >60.00)
Glucose, Bld: 104 mg/dL — ABNORMAL HIGH (ref 70–99)
Potassium: 4.5 meq/L (ref 3.5–5.1)
Sodium: 140 meq/L (ref 135–145)
Total Bilirubin: 0.5 mg/dL (ref 0.2–1.2)
Total Protein: 6.4 g/dL (ref 6.0–8.3)

## 2023-07-30 LAB — HEMOGLOBIN A1C: Hgb A1c MFr Bld: 6 % (ref 4.6–6.5)

## 2023-07-30 LAB — TSH: TSH: 1.85 u[IU]/mL (ref 0.35–5.50)

## 2023-07-30 NOTE — Assessment & Plan Note (Signed)
 Discussed with patient no current advanced directive in place

## 2023-07-30 NOTE — Assessment & Plan Note (Signed)
 History of the same on suppressive therapy with valacyclovir  500 mg daily.  Continue

## 2023-07-30 NOTE — Assessment & Plan Note (Signed)
 History of the same.  Patient denies HI/SI/AVH.  Currently maintained on BuSpar  7.5 mg twice daily.  Continue

## 2023-07-30 NOTE — Assessment & Plan Note (Signed)
 Will check UA with reflex to culture.  If negative consider oxybutynin 

## 2023-07-30 NOTE — Assessment & Plan Note (Signed)
 Discussed age-appropriate immunizations and screening exams.  Did review patient's personal, surgical, social, family histories.  Patient is up-to-date on all age-appropriate vaccinations she would like.  Patient refuses CRC screening, breast cancer screening and has aged out of cervical cancer screening.  We did order a DEXA scan for osteoporosis screening.  Patient was given information at discharge about preventative healthcare maintenance with anticipatory guidance.

## 2023-07-30 NOTE — Patient Instructions (Signed)
Nice to see you today I will be in touch with the labs once I have reviewed them Follow up with me in 6 months, sooner if you need me

## 2023-07-30 NOTE — Assessment & Plan Note (Signed)
 History of the same.  Patient currently maintained on amlodipine  5 mg, hydrochlorothiazide  12.5 mg, irbesartan  3 g, metoprolol  25 mg twice daily.  She is followed by cardiology.  Blood pressure within normal limits continue taking medication as prescribed follow-up with specialist as recommended

## 2023-07-30 NOTE — Assessment & Plan Note (Signed)
 History of the same.  Currently maintained on atorvastatin  40 mg daily.  Pending lipid panel today

## 2023-07-30 NOTE — Assessment & Plan Note (Signed)
 History of the same has had lumbar surgery in the past was followed by orthopedist.  Currently maintained on tramadol -acetaminophen  37.5 mg - 325 mg twice daily as needed along with methocarbamol  500 mg 3 times daily as needed and gabapentin  nightly.   continue medication as prescribed.

## 2023-07-30 NOTE — Progress Notes (Signed)
 Established Patient Office Visit  Subjective   Patient ID: Heather [name], female    DOB: Mar 03, 1945  Age: 78 y.o. MRN: [id number]  Chief Complaint  Patient presents with   Annual Exam    HPI  HTN: Patient currently maintained on amlodipine  5 mg, hydrochlorothiazide  12.5 mg, irbesartan  75 mg, metoprolol  25 mg twice daily.  She is currently followed by cardiology. Can check it at home but does not check it as she should   GAD: Patient currently maintained on BuSpar  7.5 mg twice daily. States that she is doing ok with her mood   Chronic pain: Patient currently maintained on tramadol -acetaminophen  37.5-325 mg 1 tablet twice daily as needed.  Patient is also maintained on gabapentin  300 mg nightly and methocarbamol  500 mg 1 tablet twice daily as needed up to 3 times daily usage. She was followed by ortho in the past and was asked if I would take over the medication. She was followed by Dr. Lucilla   HLD: Patient currently maintained on atorvastatin  40 mg daily  Genital herpes: on valacyclovir  daily for suppression therapy   for complete physical and follow up of chronic conditions.  Immunizations: -Tetanus: Completed in 2012 -Influenza: Out of season -Shingles: refused  -Pneumonia: Needs updating, refused   Diet: Fair diet.  She is eating healthy and a lot of home cooked meals. 3 meals a day and rarely snacks. She drinks lots of water and gatorade and sweet tea Exercise: No regular exercise. Helps the spouse  Eye exam: needs to update and wears glasses   Dental exam: Completes semi-annually .   Colonoscopy: Completed in?  Aged out. Ordered cologuard in 07/03/2022 and did not complete. She refuses currently  Lung Cancer Screening: N/A  Pap Smear: Aged out  Mammogram: refused   DEXA: [place]   Advanced directives: does not have   Sleep: states that she will go to bed around 10 and will get up around 630. Feels rested. She does have a nap after lunch        Review of  Systems  Constitutional:  Negative for chills and fever.  Respiratory:  Negative for shortness of breath.   Cardiovascular:  Negative for chest pain and leg swelling.  Gastrointestinal:  Negative for abdominal pain, blood in stool, constipation, diarrhea, nausea and vomiting.       BM every other day to every third day   Genitourinary:  Negative for dysuria and hematuria.  Neurological:  Negative for dizziness, tingling and headaches.  Psychiatric/Behavioral:  Negative for hallucinations and suicidal ideas.       Objective:     BP 138/60   Pulse 60   Temp 98.3 F (36.8 C) (Oral)   Ht 5' 3 (1.6 m)   Wt 141 lb 9.6 oz (64.2 kg)   LMP 12/29/2011   SpO2 97%   BMI 25.08 kg/m  BP Readings from Last 3 Encounters:  07/30/23 138/60  07/16/23 (!) 155/64  04/21/23 (!) 150/78   Wt Readings from Last 3 Encounters:  07/30/23 141 lb 9.6 oz (64.2 kg)  04/21/23 143 lb (64.9 kg)  01/02/23 147 lb (66.7 kg)   SpO2 Readings from Last 3 Encounters:  07/30/23 97%  07/16/23 100%  01/02/23 98%      Physical Exam Vitals and nursing note reviewed.  Constitutional:      Appearance: Normal appearance.  HENT:     Right Ear: Tympanic membrane, ear canal and external ear normal.     Left Ear: Tympanic  membrane, ear canal and external ear normal.     Mouth/Throat:     Mouth: Mucous membranes are moist.     Pharynx: Oropharynx is clear.  Eyes:     Extraocular Movements: Extraocular movements intact.     Pupils: Pupils are equal, round, and reactive to light.  Cardiovascular:     Rate and Rhythm: Normal rate and regular rhythm.     Pulses: Normal pulses.     Heart sounds: Normal heart sounds.  Pulmonary:     Effort: Pulmonary effort is normal.     Breath sounds: Normal breath sounds.  Abdominal:     General: Bowel sounds are normal. There is no distension.     Palpations: There is no mass.     Tenderness: There is no abdominal tenderness.     Hernia: No hernia is present.   Musculoskeletal:     Right lower leg: No edema.     Left lower leg: No edema.  Lymphadenopathy:     Cervical: No cervical adenopathy.  Skin:    General: Skin is warm.  Neurological:     General: No focal deficit present.     Mental Status: She is alert.     Deep Tendon Reflexes:     Reflex Scores:      Bicep reflexes are 2+ on the right side and 2+ on the left side.      Patellar reflexes are 2+ on the right side and 2+ on the left side.    Comments: Bilateral upper and lower extremity strength 5/5  Psychiatric:        Mood and Affect: Mood normal.        Behavior: Behavior normal.        Thought Content: Thought content normal.        Judgment: Judgment normal.      No results found for any visits on 07/30/23.    The ASCVD Risk score (Arnett DK, et al., 2019) failed to calculate for the following reasons:   Risk score cannot be calculated because patient has a medical history suggesting prior/existing ASCVD    Assessment & Plan:   Problem List Items Addressed This Visit       Cardiovascular and Mediastinum   Carotid disease, bilateral (HCC)   History of the same.  Patient currently maintained on atorvastatin  40 mg daily status post endarterectomy      Hypertension   History of the same.  Patient currently maintained on amlodipine  5 mg, hydrochlorothiazide  12.5 mg, irbesartan  3 g, metoprolol  25 mg twice daily.  She is followed by cardiology.  Blood pressure within normal limits continue taking medication as prescribed follow-up with specialist as recommended      Relevant Orders   CBC   Comprehensive metabolic panel with GFR   TSH     Genitourinary   Genital herpes   History of the same on suppressive therapy with valacyclovir  500 mg daily.  Continue        Other   Anxiety and depression   History of the same.  Patient denies HI/SI/AVH.  Currently maintained on BuSpar  7.5 mg twice daily.  Continue      Hyperlipidemia   History of the same.  Currently  maintained on atorvastatin  40 mg daily.  Pending lipid panel today      Relevant Orders   Lipid panel   Lumbar stenosis with neurogenic claudication   History of the same has had lumbar surgery in the past was followed  by orthopedist.  Currently maintained on tramadol -acetaminophen  37.5 mg - 325 mg twice daily as needed along with methocarbamol  500 mg 3 times daily as needed and gabapentin  nightly.   continue medication as prescribed.      Preventative health care - Primary   Discussed age-appropriate immunizations and screening exams.  Did review patient's personal, surgical, social, family histories.  Patient is up-to-date on all age-appropriate vaccinations she would like.  Patient refuses CRC screening, breast cancer screening and has aged out of cervical cancer screening.  We did order a DEXA scan for osteoporosis screening.  Patient was given information at discharge about preventative healthcare maintenance with anticipatory guidance.      Advance directive discussed with patient   Discussed with patient no current advanced directive in place      Urinary incontinence   Will check UA with reflex to culture.  If negative consider oxybutynin       Relevant Orders   Urinalysis w microscopic + reflex cultur   Other Visit Diagnoses       Screening for diabetes mellitus       Relevant Orders   Hemoglobin A1c     Screening for osteoporosis       Relevant Orders   DG Bone Density       Return in about 6 months (around 01/30/2024) for BP recheck/med recheck .    Adina Crandall, NP

## 2023-07-30 NOTE — Assessment & Plan Note (Signed)
 History of the same.  Patient currently maintained on atorvastatin  40 mg daily status post endarterectomy

## 2023-07-31 LAB — URINALYSIS W MICROSCOPIC + REFLEX CULTURE
Bacteria, UA: NONE SEEN /HPF
Bilirubin Urine: NEGATIVE
Glucose, UA: NEGATIVE
Hgb urine dipstick: NEGATIVE
Hyaline Cast: NONE SEEN /LPF
Ketones, ur: NEGATIVE
Leukocyte Esterase: NEGATIVE
Nitrites, Initial: NEGATIVE
Protein, ur: NEGATIVE
RBC / HPF: NONE SEEN /HPF (ref 0–2)
Specific Gravity, Urine: 1.006 (ref 1.001–1.035)
Squamous Epithelial / HPF: NONE SEEN /HPF (ref <=5)
WBC, UA: NONE SEEN /HPF (ref 0–5)
pH: 7 (ref 5.0–8.0)

## 2023-07-31 LAB — NO CULTURE INDICATED

## 2023-08-03 ENCOUNTER — Other Ambulatory Visit: Payer: Self-pay | Admitting: Nurse Practitioner

## 2023-08-04 ENCOUNTER — Ambulatory Visit: Payer: Self-pay | Admitting: Nurse Practitioner

## 2023-08-22 ENCOUNTER — Other Ambulatory Visit: Payer: Self-pay | Admitting: Nurse Practitioner

## 2023-08-22 DIAGNOSIS — A6004 Herpesviral vulvovaginitis: Secondary | ICD-10-CM

## 2023-09-03 ENCOUNTER — Other Ambulatory Visit: Payer: Self-pay | Admitting: Nurse Practitioner

## 2023-09-05 ENCOUNTER — Telehealth: Payer: Self-pay

## 2023-09-05 NOTE — Telephone Encounter (Signed)
 [facility] wants to know if its okay for the patient to be taking Tramadol  and methocarbamol  and if benefits outweigh risks.

## 2023-09-05 NOTE — Telephone Encounter (Signed)
 I have had this discussion with her and she was followed by ortho. I feel that it is appropriate as she is getting benefit with ADLs with using the medications

## 2023-10-03 ENCOUNTER — Other Ambulatory Visit: Payer: Self-pay | Admitting: Nurse Practitioner

## 2023-10-07 ENCOUNTER — Other Ambulatory Visit: Payer: Self-pay | Admitting: Nurse Practitioner

## 2023-11-02 NOTE — Progress Notes (Unsigned)
 Cardiology Office Note:    Date:  11/02/2023   ID:  Heather [name], Heather [name] 30-Oct-1945, MRN [id number]  PCP:  Wendee Lynwood HERO, NP  Cardiologist:  Aleene Passe, MD (Inactive)  Electrophysiologist:  None   Referring MD: Wendee Lynwood HERO, NP   No chief complaint on file. ***  History of Present Illness:    Heather [name] is a 78 y.o. female with a hx of hypertension, hyperlipidemia, carotid artery disease, CVA who presents for follow-up.  Previously followed with Dr. Passe.  Lexiscan  Myoview in 2018 showed normal perfusion.  Echocardiogram 11/2016 showed normal biventricular function, elevated gradients through aortic valve but appeared to open well, moderate left atrial enlargement.  Carotid duplex 10/2022 showed 1 to 39% bilateral carotid stenosis.  Status post right CEA.  Since last clinic visit,  Past Medical History:  Diagnosis Date   Anxiety    Arthritis    knees, right hip, lumbar back (12/17/2016)   Chicken pox    Chronic lower back pain    DDD (degenerative disc disease), lumbar    Depression    Headache 06/23/2015   2d after carotid OR & again today (12/17/2016)   Heart murmur    MVP   High cholesterol    History of blood transfusion 1975   related to femur  fracture (12/17/2016)   HOH (hard of hearing)    left   Hypertension    MVP (mitral valve prolapse)    echo 1986-mild   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   Orogenital transmission (husband had cold sore; HSV1 culture positive)   Shortness of breath dyspnea    WITH EXERTION     Spinal stenosis of lumbar region    Stroke (HCC) 04/2015   light stroke; sometimes my thinking is slow since (12/17/2016)   Wears glasses     Past Surgical History:  Procedure Laterality Date   CARPAL TUNNEL RELEASE Right 03/31/2013   Procedure: RIGHT CARPAL TUNNEL RELEASE;  Surgeon: Arley JONELLE Curia, MD;  Location: [facility];  Service: Orthopedics;  Laterality: Right;   ENDARTERECTOMY Right  05/03/2015   Procedure: RIGHT CAROTID ENDARTERECTOMY ;  Surgeon: Redell LITTIE Door, MD;  Location: [facility] OR;  Service: Vascular;  Laterality: Right;   FEMUR FRACTURE SURGERY Right 1975   hip   FRACTURE SURGERY     LUMBAR LAMINECTOMY N/A 05/26/2017   Procedure: BILATERAL PARTIAL HEMILAMINECTOMIES L3-4 AND L4-5;  Surgeon: Lucilla Lynwood BRAVO, MD;  Location: [facility] OR;  Service: Orthopedics;  Laterality: N/A;   NASAL SINUS SURGERY  1975   PLANTAR FASCIA RELEASE Bilateral 1968   SHOULDER OPEN ROTATOR CUFF REPAIR Right 1998   TONSILLECTOMY  1951   TUBAL LIGATION  1995    Current Medications: No outpatient medications have been marked as taking for the 11/07/23 encounter (Appointment) with Kate Lonni LITTIE, MD.     Allergies:   Sulfamethoxazole   Social History   Socioeconomic History   Marital status: Divorced    Spouse name: Not on file   Number of children: 1   Years of education: 12   Highest education level: Not on file  Occupational History   Occupation: Retired  Tobacco Use   Smoking status: Never   Smokeless tobacco: Never  Vaping Use   Vaping status: Never Used  Substance and Sexual Activity   Alcohol use: Not Currently    Alcohol/week: 3.0 standard drinks of alcohol    Types: 3 Cans of beer per week  Drug use: No   Sexual activity: Yes  Other Topics Concern   Not on file  Social History Narrative   Lives at home w/ significant other   Right-handed   Drinks 2 cups caffeine per day   Social Drivers of Health   Financial Resource Strain: Low Risk  (04/21/2023)   Overall Financial Resource Strain (CARDIA)    Difficulty of Paying Living Expenses: Not hard at all  Food Insecurity: No Food Insecurity (04/21/2023)   Hunger Vital Sign    Worried About Running Out of Food in the Last Year: Never true    Ran Out of Food in the Last Year: Never true  Transportation Needs: No Transportation Needs (04/21/2023)   PRAPARE - Administrator, Civil Service (Medical): No     Lack of Transportation (Non-Medical): No  Physical Activity: Sufficiently Active (04/21/2023)   Exercise Vital Sign    Days of Exercise per Week: 7 days    Minutes of Exercise per Session: 30 min  Stress: No Stress Concern Present (04/21/2023)   [employer] of Occupational Health - Occupational Stress Questionnaire    Feeling of Stress : Not at all  Social Connections: Socially Isolated (04/21/2023)   Social Connection and Isolation Panel    Frequency of Communication with Friends and Family: More than three times a week    Frequency of Social Gatherings with Friends and Family: More than three times a week    Attends Religious Services: Never    Database administrator or Organizations: No    Attends Engineer, structural: Never    Marital Status: Divorced     Family History: The patient's ***family history includes Arthritis in her mother; Cancer in her sister; Diabetes in her father; Heart attack in her father; Heart disease in her mother; Hyperlipidemia in her mother; Hypertension in her mother; Osteoporosis in her mother; Stroke in her mother. There is no history of Colon cancer. She was adopted.  ROS:   Please see the history of present illness.    *** All other systems reviewed and are negative.  EKGs/Labs/Other Studies Reviewed:    The following studies were reviewed today: ***  EKG:  EKG is *** ordered today.  The ekg ordered today demonstrates ***  Recent Labs: 07/30/2023: ALT 19; BUN 21; Creatinine, Ser 1.05; Hemoglobin 12.8; Platelets 309.0; Potassium 4.5; Sodium 140; TSH 1.85  Recent Lipid Panel    Component Value Date/Time   CHOL 154 07/30/2023 1018   CHOL 170 04/21/2020 1011   TRIG 92.0 07/30/2023 1018   HDL 67.20 07/30/2023 1018   HDL 76 04/21/2020 1011   CHOLHDL 2 07/30/2023 1018   VLDL 18.4 07/30/2023 1018   LDLCALC 68 07/30/2023 1018   LDLCALC 74 04/21/2020 1011   LDLDIRECT 132.0 02/21/2015 1331    Physical Exam:    VS:  LMP 12/29/2011      Wt Readings from Last 3 Encounters:  07/30/23 141 lb 9.6 oz (64.2 kg)  04/21/23 143 lb (64.9 kg)  01/02/23 147 lb (66.7 kg)     GEN: *** Well nourished, well developed in no acute distress HEENT: Normal NECK: No JVD; No carotid bruits LYMPHATICS: No lymphadenopathy CARDIAC: ***RRR, no murmurs, rubs, gallops RESPIRATORY:  Clear to auscultation without rales, wheezing or rhonchi  ABDOMEN: Soft, non-tender, non-distended MUSCULOSKELETAL:  No edema; No deformity  SKIN: Warm and dry NEUROLOGIC:  Alert and oriented x 3 PSYCHIATRIC:  Normal affect   ASSESSMENT:    No diagnosis  found. PLAN:    Hypertension: On HCTZ 12.5 mg daily, irbesartan  70 daily, amlodipine  5 mg daily, metoprolol  25 mg twice daily  Hyperlipidemia: On atorvastatin  40 mg daily  CVA: Continue aspirin , statin  Carotid artery disease: Carotid duplex 10/2022 showed 1 to 39% bilateral carotid stenosis.  Status post right CEA.  RTC in***   Medication Adjustments/Labs and Tests Ordered: Current medicines are reviewed at length with the patient today.  Concerns regarding medicines are outlined above.  No orders of the defined types were placed in this encounter.  No orders of the defined types were placed in this encounter.   There are no Patient Instructions on file for this visit.   Signed, Lonni LITTIE Nanas, MD  11/02/2023 3:24 PM    [facility]

## 2023-11-05 ENCOUNTER — Other Ambulatory Visit: Payer: Self-pay | Admitting: Nurse Practitioner

## 2023-11-07 ENCOUNTER — Ambulatory Visit

## 2023-11-07 ENCOUNTER — Ambulatory Visit: Attending: Cardiology | Admitting: Cardiology

## 2023-11-07 ENCOUNTER — Encounter: Payer: Self-pay | Admitting: Cardiology

## 2023-11-07 VITALS — BP 130/40 | HR 54 | Ht 64.0 in | Wt 143.8 lb

## 2023-11-07 DIAGNOSIS — R0989 Other specified symptoms and signs involving the circulatory and respiratory systems: Secondary | ICD-10-CM

## 2023-11-07 DIAGNOSIS — E782 Mixed hyperlipidemia: Secondary | ICD-10-CM | POA: Diagnosis not present

## 2023-11-07 DIAGNOSIS — R55 Syncope and collapse: Secondary | ICD-10-CM | POA: Diagnosis not present

## 2023-11-07 DIAGNOSIS — I10 Essential (primary) hypertension: Secondary | ICD-10-CM | POA: Diagnosis not present

## 2023-11-07 DIAGNOSIS — Z09 Encounter for follow-up examination after completed treatment for conditions other than malignant neoplasm: Secondary | ICD-10-CM

## 2023-11-07 DIAGNOSIS — I6523 Occlusion and stenosis of bilateral carotid arteries: Secondary | ICD-10-CM

## 2023-11-07 NOTE — Progress Notes (Unsigned)
 Enrolled patient for a 14 day Zio XT  monitor to be mailed to patients home

## 2023-11-07 NOTE — Patient Instructions (Signed)
 Medication Instructions:  Your physician recommends that you continue on your current medications as directed. Please refer to the Current Medication list given to you today.  *If you need a refill on your cardiac medications before your next appointment, please call your pharmacy*  Lab Work: NONE If you have labs (blood work) drawn today and your tests are completely normal, you will receive your results only by: MyChart Message (if you have MyChart) OR A paper copy in the mail If you have any lab test that is abnormal or we need to change your treatment, we will call you to review the results.  Testing/Procedures: Echo Your physician has requested that you have an echocardiogram. Echocardiography is a painless test that uses sound waves to create images of your heart. It provides your doctor with information about the size and shape of your heart and how well your heart's chambers and valves are working. This procedure takes approximately one hour. There are no restrictions for this procedure. Please do NOT wear cologne, perfume, aftershave, or lotions (deodorant is allowed). Please arrive 15 minutes prior to your appointment time.  Please note: We ask at that you not bring children with you during ultrasound (echo/ vascular) testing. Due to room size and safety concerns, children are not allowed in the ultrasound rooms during exams. Our front office staff cannot provide observation of children in our lobby area while testing is being conducted. An adult accompanying a patient to their appointment will only be allowed in the ultrasound room at the discretion of the ultrasound technician under special circumstances. We apologize for any inconvenience.   And  ZIO XT- Long Term Monitor Instructions  Your physician has requested you wear a ZIO patch monitor for 14 days.  This is a single patch monitor. [employer] supplies one patch monitor per enrollment. Additional stickers are not available.  Please do not apply patch if you will be having a Nuclear Stress Test,  Echocardiogram, Cardiac CT, MRI, or Chest Xray during the period you would be wearing the  monitor. The patch cannot be worn during these tests. You cannot remove and re-apply the  ZIO XT patch monitor.  Your ZIO patch monitor will be mailed 3 day USPS to your address on file. It may take 3-5 days  to receive your monitor after you have been enrolled.  Once you have received your monitor, please review the enclosed instructions. Your monitor  has already been registered assigning a specific monitor serial # to you.  Billing and Patient Assistance Program Information  We have supplied [employer] with any of your insurance information on file for billing purposes. [employer] offers a sliding scale Patient Assistance Program for patients that do not have  insurance, or whose insurance does not completely cover the cost of the ZIO monitor.  You must apply for the Patient Assistance Program to qualify for this discounted rate.  To apply, please call [employer] at [phone], select option 4, select option 2, ask to apply for  Patient Assistance Program. Meredeth will ask your household income, and how many people  are in your household. They will quote your out-of-pocket cost based on that information.  [employer] will also be able to set up a 46-month, interest-free payment plan if needed.  Applying the monitor   Shave hair from upper left chest.  Hold abrader disc by orange tab. Rub abrader in 40 strokes over the upper left chest as  indicated in your monitor instructions.  Clean area with 4 enclosed  alcohol pads. Let dry.  Apply patch as indicated in monitor instructions. Patch will be placed under collarbone on left  side of chest with arrow pointing upward.  Rub patch adhesive wings for 2 minutes. Remove white label marked 1. Remove the white  label marked 2. Rub patch adhesive wings for 2 additional minutes.  While  looking in a mirror, press and release button in center of patch. A small green light will  flash 3-4 times. This will be your only indicator that the monitor has been turned on.  Do not shower for the first 24 hours. You may shower after the first 24 hours.  Press the button if you feel a symptom. You will hear a small click. Record Date, Time and  Symptom in the Patient Logbook.  When you are ready to remove the patch, follow instructions on the last 2 pages of Patient  Logbook. Stick patch monitor onto the last page of Patient Logbook.  Place Patient Logbook in the blue and white box. Use locking tab on box and tape box closed  securely. The blue and white box has prepaid postage on it. Please place it in the mailbox as  soon as possible. Your physician should have your test results approximately 7 days after the  monitor has been mailed back to [facility].  Call [facility] Customer Care at [phone] if you have questions regarding  your ZIO XT patch monitor. Call them immediately if you see an orange light blinking on your  monitor.  If your monitor falls off in less than 4 days, contact our Monitor department at [phone].  If your monitor becomes loose or falls off after 4 days call [employer] at [phone] for  suggestions on securing your monitor   Follow-Up: At [facility], you and your health needs are our priority.  As part of our continuing mission to provide you with exceptional heart care, our providers are all part of one team.  This team includes your primary Cardiologist (physician) and Advanced Practice Providers or APPs (Physician Assistants and Nurse Practitioners) who all work together to provide you with the care you need, when you need it.  Your next appointment:  3  months   Provider:   Dr. KATE  We recommend signing up for the patient portal called MyChart.  Sign up information is provided on this After Visit Summary.  MyChart is  used to connect with patients for Virtual Visits (Telemedicine).  Patients are able to view lab/test results, encounter notes, upcoming appointments, etc.  Non-urgent messages can be sent to your provider as well.   To learn more about what you can do with MyChart, go to [email or web address].   Other Instructions Your physician has requested that you have  upper extremity arterial duplex. This test is an ultrasound of the arteries in the legs or arms. It looks at arterial blood flow in the legs and arms. Allow one hour for Lower and Upper Arterial scans. There are no restrictions or special instructions.  Please note: We ask at that you not bring children with you during ultrasound (echo/ vascular) testing. Due to room size and safety concerns, children are not allowed in the ultrasound rooms during exams. Our front office staff cannot provide observation of children in our lobby area while testing is being conducted. An adult accompanying a patient to their appointment will only be allowed in the ultrasound room at the discretion of the ultrasound technician under special circumstances. We apologize for any  inconvenience.

## 2023-11-16 ENCOUNTER — Other Ambulatory Visit: Payer: Self-pay | Admitting: Nurse Practitioner

## 2023-11-16 DIAGNOSIS — F419 Anxiety disorder, unspecified: Secondary | ICD-10-CM

## 2023-11-19 ENCOUNTER — Ambulatory Visit (HOSPITAL_COMMUNITY)
Admission: RE | Admit: 2023-11-19 | Discharge: 2023-11-19 | Disposition: A | Discharge status: Home / Self Care | Source: Ambulatory Visit | Attending: Cardiology | Admitting: Cardiology

## 2023-11-19 DIAGNOSIS — R0989 Other specified symptoms and signs involving the circulatory and respiratory systems: Secondary | ICD-10-CM | POA: Diagnosis not present

## 2023-11-20 ENCOUNTER — Ambulatory Visit: Payer: Self-pay | Admitting: Cardiology

## 2023-12-01 ENCOUNTER — Other Ambulatory Visit: Payer: Self-pay | Admitting: Cardiology

## 2023-12-02 ENCOUNTER — Telehealth: Payer: Self-pay | Admitting: Cardiology

## 2023-12-02 MED ORDER — IRBESARTAN 75 MG PO TABS: 75.0000 mg | ORAL_TABLET | Freq: Every day | ORAL | 3 refills | Status: AC

## 2023-12-02 NOTE — Telephone Encounter (Signed)
 Pt's medication was sent to pt's pharmacy as requested. Confirmation received.

## 2023-12-02 NOTE — Telephone Encounter (Signed)
*  STAT* If patient is at the pharmacy, call can be transferred to refill team.   1. Which medications need to be refilled? (please list name of each medication and dose if known)  irbesartan  (AVAPRO ) 75 MG tablet  2. Which pharmacy/location (including street and city if local pharmacy) is medication to be sent to? [id number] [name], [place] - [address]   3. Do they need a 30 day or 90 day supply?  90 day supply  Patient says she is completely out of medication.

## 2023-12-04 ENCOUNTER — Ambulatory Visit (HOSPITAL_BASED_OUTPATIENT_CLINIC_OR_DEPARTMENT_OTHER): Admitting: Cardiology

## 2023-12-05 ENCOUNTER — Telehealth: Payer: Self-pay | Admitting: Cardiology

## 2023-12-05 NOTE — Telephone Encounter (Signed)
 Pt wanted to make office aware she has mailed back her heart monitor. Please advise.

## 2023-12-06 ENCOUNTER — Other Ambulatory Visit: Payer: Self-pay | Admitting: Nurse Practitioner

## 2023-12-11 ENCOUNTER — Telehealth: Payer: Self-pay | Admitting: Cardiology

## 2023-12-11 DIAGNOSIS — R55 Syncope and collapse: Secondary | ICD-10-CM

## 2023-12-11 DIAGNOSIS — I471 Supraventricular tachycardia, unspecified: Secondary | ICD-10-CM

## 2023-12-11 NOTE — Telephone Encounter (Signed)
 Received call from [employer] regarding abnormal zio patch results. November 3 at 7:53 am report of SVT rate 186 lasting 60 seconds - no symptoms reported.  This information can be seen on report pages 19 and 20 strip 8.   This information is posted in pt charts to be read by Dr Kate.  Attempted to contact pt, no answer and mailbox is full. Will review with DOD.  Information reviewed with DOD.  Dr Kate to review.

## 2023-12-11 NOTE — Telephone Encounter (Signed)
 Monitor results by Dr Kate Lonni LITTIE Kate, MD 12/11/2023 12:27 PM EST     Frequent SVT, appeared symptomatic during episodes.  Recommend referral to EP    Attempted to contact pt to review results and his order to refer to EP.  No answer and mailbox is full.

## 2023-12-11 NOTE — Telephone Encounter (Signed)
 Caller Elta) is reporting abnormal results.

## 2023-12-16 NOTE — Telephone Encounter (Signed)
 Attempted again to contact pt.  No answer.  Call goes directly to voicemail and mail box is full.  Will continue to attempt to contact pt.

## 2023-12-22 ENCOUNTER — Ambulatory Visit (HOSPITAL_COMMUNITY)
Admission: RE | Admit: 2023-12-22 | Discharge: 2023-12-22 | Disposition: A | Discharge status: Home / Self Care | Source: Ambulatory Visit | Attending: Cardiovascular Disease | Admitting: Cardiovascular Disease

## 2023-12-22 DIAGNOSIS — I6523 Occlusion and stenosis of bilateral carotid arteries: Secondary | ICD-10-CM | POA: Insufficient documentation

## 2023-12-22 DIAGNOSIS — R55 Syncope and collapse: Secondary | ICD-10-CM | POA: Diagnosis not present

## 2023-12-22 DIAGNOSIS — Z09 Encounter for follow-up examination after completed treatment for conditions other than malignant neoplasm: Secondary | ICD-10-CM | POA: Diagnosis present

## 2023-12-22 DIAGNOSIS — E782 Mixed hyperlipidemia: Secondary | ICD-10-CM | POA: Insufficient documentation

## 2023-12-22 DIAGNOSIS — I10 Essential (primary) hypertension: Secondary | ICD-10-CM | POA: Insufficient documentation

## 2023-12-22 LAB — ECHOCARDIOGRAM COMPLETE
Area-P 1/2: 3.88 cm2
S' Lateral: 2.2 cm

## 2023-12-29 NOTE — Telephone Encounter (Signed)
 Letter of results mailed to pt's home address since we have not been able to contact her by phone.  Order placed for EP consult

## 2024-01-02 ENCOUNTER — Telehealth: Payer: Self-pay | Admitting: Cardiology

## 2024-01-02 ENCOUNTER — Other Ambulatory Visit: Payer: Self-pay | Admitting: Nurse Practitioner

## 2024-01-02 MED ORDER — HYDROCHLOROTHIAZIDE 12.5 MG PO CAPS: 12.5000 mg | ORAL_CAPSULE | Freq: Every day | ORAL | 2 refills | Status: AC

## 2024-01-02 NOTE — Telephone Encounter (Signed)
 Spoke with patient for update regarding medication refill. [facility] [place]. No further needs at this time.

## 2024-01-02 NOTE — Telephone Encounter (Signed)
 Refill has already been sent

## 2024-01-02 NOTE — Telephone Encounter (Signed)
 Pt c/o medication issue:  1. Name of Medication: hydrochlorothiazide  (MICROZIDE ) 12.5 MG capsule   2. How are you currently taking this medication (dosage and times per day)? TAKE 1 CAPSULE BY MOUTH EVERY DAY   3. Are you having a reaction (difficulty breathing--STAT)? No  4. What is your medication issue? Patient is currently out of medication. Patient requested a refill which was sent to the refill team this morning. Patient would like to know if she wasn't able to get the medications today, is this a medication she could go with out for a few days (if the medication will stay in her system)? Please advise.

## 2024-01-02 NOTE — Telephone Encounter (Signed)
*  STAT* If patient is at the pharmacy, call can be transferred to refill team.   1. Which medications need to be refilled? (please list name of each medication and dose if known) hydrochlorothiazide  (MICROZIDE ) 12.5 MG capsule  TAKE 1 CAPSULE BY MOUTH EVERY DAY   2. Would you like to learn more about the convenience, safety, & potential cost savings by using the [facility]? No     3. Are you open to using the [facility]? No   4. Which pharmacy/location (including street and city if local pharmacy) is medication to be sent to? [id number] [name], [place] - [address]    5. Do they need a 30 day or 90 day supply? 90 Day Supply  Pt is currently out of medications.

## 2024-01-06 ENCOUNTER — Other Ambulatory Visit: Payer: Self-pay | Admitting: Nurse Practitioner

## 2024-01-07 ENCOUNTER — Other Ambulatory Visit: Payer: Self-pay | Admitting: Nurse Practitioner

## 2024-01-07 NOTE — Telephone Encounter (Signed)
 Copied from CRM #[id number]. Topic: Clinical - Medication Refill >> Jan 07, 2024  2:24 PM Victoria A wrote: Medication: traMADol -acetaminophen  (ULTRACET ) 37.5-325 MG tablet  Has the patient contacted their pharmacy? Yes (Agent: If no, request that the patient contact the pharmacy for the refill. If patient does not wish to contact the pharmacy document the reason why and proceed with request.) (Agent: If yes, when and what did the pharmacy advise?) Contact Primary  This is the patient's preferred pharmacy:  [phone] [place] - [address] [place] [address] Phone: [phone] Fax: [phone]   Is this the correct pharmacy for this prescription? Yes If no, delete pharmacy and type the correct one.   Has the prescription been filled recently? No  Is the patient out of the medication? No 2 pills left  Has the patient been seen for an appointment in the last year OR does the patient have an upcoming appointment? Yes  Can we respond through MyChart? No  Agent: Please be advised that Rx refills may take up to 3 business days. We ask that you follow-up with your pharmacy.

## 2024-01-09 ENCOUNTER — Other Ambulatory Visit: Payer: Self-pay | Admitting: Nurse Practitioner

## 2024-01-30 ENCOUNTER — Ambulatory Visit: Admitting: Nurse Practitioner

## 2024-01-30 VITALS — BP 138/50 | HR 54 | Temp 98.2°F | Ht 64.0 in | Wt 145.6 lb

## 2024-01-30 DIAGNOSIS — M48062 Spinal stenosis, lumbar region with neurogenic claudication: Secondary | ICD-10-CM | POA: Diagnosis not present

## 2024-01-30 DIAGNOSIS — I10 Essential (primary) hypertension: Secondary | ICD-10-CM

## 2024-01-30 LAB — COMPREHENSIVE METABOLIC PANEL WITH GFR
ALT: 17 U/L (ref 3–35)
AST: 14 U/L (ref 5–37)
Albumin: 3.9 g/dL (ref 3.5–5.2)
Alkaline Phosphatase: 94 U/L (ref 39–117)
BUN: 23 mg/dL (ref 6–23)
CO2: 30 meq/L (ref 19–32)
Calcium: 9.4 mg/dL (ref 8.4–10.5)
Chloride: 104 meq/L (ref 96–112)
Creatinine, Ser: 1.06 mg/dL (ref 0.40–1.20)
GFR: 50.12 mL/min — ABNORMAL LOW
Glucose, Bld: 117 mg/dL — ABNORMAL HIGH (ref 70–99)
Potassium: 4.5 meq/L (ref 3.5–5.1)
Sodium: 140 meq/L (ref 135–145)
Total Bilirubin: 0.5 mg/dL (ref 0.2–1.2)
Total Protein: 5.8 g/dL — ABNORMAL LOW (ref 6.0–8.3)

## 2024-01-30 NOTE — Patient Instructions (Signed)
 Nice to see you today I will be in touch with the labs once I have them Follow up in 6 months, sooner if you need me

## 2024-01-30 NOTE — Progress Notes (Signed)
 "  Established Patient Office Visit  Subjective   Patient ID: Heather [name], female    DOB: Dec 09, 1945  Age: 79 y.o. MRN: [id number]  Chief Complaint  Patient presents with   Follow-up    BP recheck/med recheck     HPI  Discussed the use of AI scribe software for clinical note transcription with the patient, who gave verbal consent to proceed.  History of Present Illness Heather [name] is a 79 year old female who presents for a routine six-month follow-up visit.  She continues to find her current medications, including tramadol  and methocarbamol , effective in managing her symptoms. Her insurance no longer covers the pain medication, but she finds the generic version affordable at $5.  She has a history of syncope, which led to an emergency room visit.  Subsequently she was seen by her cardiologist, she underwent a comprehensive cardiac workup, including a heart monitor, ultrasounds, and an echocardiogram. She has an upcoming appointment with her cardiologist on January 23. She experienced a heart rate increase to 194 bpm lasting almost five minutes, which she manages with vagal maneuvers. These episodes have become more frequent over the years.  She experiences bowel changes, likely related to tramadol  use, with bowel movements occurring every other day or sometimes every three to four days. She has a hernia that causes discomfort during bowel movements.  She attributes a previous fainting episode to dehydration and heat exposure while accompanying her partner at work. She limits fluid intake during the day to avoid using public restrooms, which may contribute to dehydration.  She remains active, accompanying her partner Kirt) to work daily and maintaining a busy lifestyle. She reads regularly to keep her mind active and enjoys spending time outdoors, even in colder weather, to get sunlight. No fever, chills, chest pain, or shortness of breath.     Review of Systems   Constitutional:  Negative for chills and fever.  Respiratory:  Negative for shortness of breath.   Cardiovascular:  Positive for palpitations. Negative for chest pain.  Neurological:  Negative for loss of consciousness and headaches.      Objective:     BP (!) 138/50   Pulse (!) 54   Temp 98.2 F (36.8 C) (Oral)   Ht 5' 4 (1.626 m)   Wt 145 lb 9.6 oz (66 kg)   LMP 12/29/2011   SpO2 98%   BMI 24.99 kg/m  BP Readings from Last 3 Encounters:  01/30/24 (!) 138/50  11/07/23 (!) 130/40  07/30/23 138/60   Wt Readings from Last 3 Encounters:  01/30/24 145 lb 9.6 oz (66 kg)  11/07/23 143 lb 12.8 oz (65.2 kg)  07/30/23 141 lb 9.6 oz (64.2 kg)   SpO2 Readings from Last 3 Encounters:  01/30/24 98%  11/07/23 98%  07/30/23 97%      Physical Exam Vitals and nursing note reviewed.  Constitutional:      Appearance: Normal appearance.  Cardiovascular:     Rate and Rhythm: Normal rate. Rhythm irregular.     Heart sounds: Normal heart sounds.     Comments: Regularly irregular  Pulmonary:     Effort: Pulmonary effort is normal.     Breath sounds: Normal breath sounds.  Abdominal:     General: Bowel sounds are normal.  Neurological:     Mental Status: She is alert.      No results found for any visits on 01/30/24.    The ASCVD Risk score (Arnett DK, et al., 2019)  failed to calculate for the following reasons:   Risk score cannot be calculated because patient has a medical history suggesting prior/existing ASCVD   * - Cholesterol units were assumed    Assessment & Plan:   Problem List Items Addressed This Visit       Cardiovascular and Mediastinum   Hypertension   Relevant Orders   Comprehensive metabolic panel with GFR     Other   Lumbar stenosis with neurogenic claudication - Primary   Assessment and Plan Assessment & Plan Paroxysmal supraventricular tachycardia with syncope Intermittent tachycardia with syncope likely due to dehydration and heat  exposure. Vagal maneuvers effective. - Continue vagal maneuvers during episodes. - Ensure adequate hydration, especially in hot weather. - Follow up with cardiologist on January 23rd.  Hypertension Managed with multiple antihypertensive medications. No recent chest pain or shortness of breath.  Recently seen by cardiology underwent workup for syncope and collapse.  Patient has an appointment with them on February 20, 2024. - Continue current antihypertensive regimen.  Hyperlipidemia Managed with atorvastatin . - Continue atorvastatin  40 mg daily.  Lumbar stenosis with neurogenic claudication Chronic condition managed with tramadol  and methocarbamol .  Patient is able to go with her partner daily while he works. - Continue tramadol -acetaminophen  and methocarbamol  as needed. - Monitor bowel movement frequency and adjust treatment if necessary.  Urinary incontinence Limiting fluid intake due to incontinence may contribute to dehydration. - Encouraged balanced fluid intake to prevent dehydration.  General Health Maintenance Annual wellness visit scheduled. Importance of preventive care and regular check-ups discussed. - Attend annual wellness visit on March 30th. - Ensure all preventive care measures are up to date.  Return in about 6 months (around 07/29/2024) for CPE and Labs.    Adina Crandall, NP  "

## 2024-02-02 ENCOUNTER — Ambulatory Visit: Payer: Self-pay | Admitting: Nurse Practitioner

## 2024-02-05 ENCOUNTER — Other Ambulatory Visit: Payer: Self-pay | Admitting: Nurse Practitioner

## 2024-02-06 ENCOUNTER — Ambulatory Visit: Admitting: Cardiology

## 2024-02-18 NOTE — Progress Notes (Unsigned)
 " Cardiology Office Note:    Date:  02/21/2024   ID:  Heather, [name] [birth year], MRN [id number]  PCP:  Wendee Lynwood HERO, NP  Cardiologist:  Aleene Passe, MD (Inactive)  Electrophysiologist:  None   Referring MD: Wendee Lynwood HERO, NP   Chief Complaint  Patient presents with   Palpitations    History of Present Illness:    Heather [name] is a 79 y.o. female with a hx of hypertension, hyperlipidemia, carotid artery disease, CVA who presents for follow-up.  Previously followed with Dr. Passe.  Lexiscan  Myoview in 2018 showed normal perfusion.  Echocardiogram 11/2016 showed normal biventricular function, elevated gradients through aortic valve but appeared to open well, moderate left atrial enlargement.  Carotid duplex 10/2022 showed 1 to 39% bilateral carotid stenosis.  Status post right CEA.  Zio patch x 14 days 11/2023 showed 90 episodes of SVT, longest lasting 1 hour 48 minutes with average rate 155 bpm.  Echocardiogram 12/22/2023 showed normal biventricular function, no significant valvular disease.  Since last clinic visit, she reports she is doing okay.  States that she continues to have palpitations.  Reports some lightheadedness but denies any further syncopal episodes.  She denies any chest pain, dyspnea, or lower extremity edema.  BP Readings from Last 3 Encounters:  02/20/24 (!) 151/63  01/30/24 (!) 138/50  11/07/23 (!) 130/40     Past Medical History:  Diagnosis Date   Anxiety    Arthritis    knees, right hip, lumbar back (12/17/2016)   Chicken pox    Chronic lower back pain    DDD (degenerative disc disease), lumbar    Depression    Headache 06/23/2015   2d after carotid OR & again today (12/17/2016)   Heart murmur    MVP   High cholesterol    History of blood transfusion 1975   related to femur  fracture (12/17/2016)   HOH (hard of hearing)    left   Hypertension    MVP (mitral valve prolapse)    echo 1986-mild   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   Orogenital transmission (husband had cold sore; HSV1 culture positive)   Shortness of breath dyspnea    WITH EXERTION     Spinal stenosis of lumbar region    Stroke (HCC) 04/2015   light stroke; sometimes my thinking is slow since (12/17/2016)   Wears glasses     Past Surgical History:  Procedure Laterality Date   CARPAL TUNNEL RELEASE Right 03/31/2013   Procedure: RIGHT CARPAL TUNNEL RELEASE;  Surgeon: Arley JONELLE Curia, MD;  Location: [facility];  Service: Orthopedics;  Laterality: Right;   ENDARTERECTOMY Right 05/03/2015   Procedure: RIGHT CAROTID ENDARTERECTOMY ;  Surgeon: Redell LITTIE Door, MD;  Location: [facility] OR;  Service: Vascular;  Laterality: Right;   FEMUR FRACTURE SURGERY Right 1975   hip   FRACTURE SURGERY     LUMBAR LAMINECTOMY N/A 05/26/2017   Procedure: BILATERAL PARTIAL HEMILAMINECTOMIES L3-4 AND L4-5;  Surgeon: Lucilla Lynwood BRAVO, MD;  Location: [facility] OR;  Service: Orthopedics;  Laterality: N/A;   NASAL SINUS SURGERY  1975   PLANTAR FASCIA RELEASE Bilateral 1968   SHOULDER OPEN ROTATOR CUFF REPAIR Right 1998   TONSILLECTOMY  1951   TUBAL LIGATION  1995    Current Medications: Current Meds  Medication Sig   acetaminophen  (TYLENOL ) 325 MG tablet Take 2 tablets (650 mg total) by mouth every 6 (six) hours as needed for moderate pain.  amLODipine  (NORVASC ) 5 MG tablet Take 1 tablet (5 mg total) by mouth at bedtime.   aspirin  EC 81 MG tablet Take 1 tablet (81 mg total) by mouth daily.   atorvastatin  (LIPITOR) 40 MG tablet TAKE 1 TABLET BY MOUTH DAILY AT 6 PM   busPIRone  (BUSPAR ) 7.5 MG tablet TAKE 1 TABLET BY MOUTH TWICE A DAY   gabapentin  (NEURONTIN ) 300 MG capsule TAKE 1 CAPSULE BY MOUTH EVERY EVENING   hydrochlorothiazide  (MICROZIDE ) 12.5 MG capsule Take 1 capsule (12.5 mg total) by mouth daily.   irbesartan  (AVAPRO ) 75 MG tablet Take 1 tablet (75 mg total) by mouth daily.   levocetirizine (XYZAL ) 5 MG tablet TAKE 1 TABLET BY MOUTH EVERY DAY IN  THE EVENING   methocarbamol  (ROBAXIN ) 500 MG tablet TAKE 1 TABLET BY MOUTH EVERY 8 TO 12 HOURS AS NEEDED FOR MUSCLE SPASMS   metoprolol  succinate (TOPROL  XL) 50 MG 24 hr tablet Take 1 tablet (50 mg total) by mouth daily. Take with or immediately following a meal.   traMADol -acetaminophen  (ULTRACET ) 37.5-325 MG tablet TAKE 1 TABLET BY MOUTH EVERY 12 HOURS AS NEEDED   triamcinolone  cream (KENALOG ) 0.1 % APPLY TO AFFECTED AREA TWICE A DAY   valACYclovir  (VALTREX ) 500 MG tablet TAKE 1 TABLET (500 MG TOTAL) BY MOUTH DAILY.   [DISCONTINUED] metoprolol  tartrate (LOPRESSOR ) 25 MG tablet TAKE 1 TABLET BY MOUTH TWICE A DAY     Allergies:   Sulfamethoxazole   Social History   Socioeconomic History   Marital status: Divorced    Spouse name: Not on file   Number of children: 1   Years of education: 12   Highest education level: Not on file  Occupational History   Occupation: Retired  Tobacco Use   Smoking status: Never   Smokeless tobacco: Never  Vaping Use   Vaping status: Never Used  Substance and Sexual Activity   Alcohol use: Not Currently    Alcohol/week: 3.0 standard drinks of alcohol    Types: 3 Cans of beer per week   Drug use: No   Sexual activity: Yes  Other Topics Concern   Not on file  Social History Narrative   Lives at home w/ significant other   Right-handed   Drinks 2 cups caffeine per day   Social Drivers of Health   Tobacco Use: Low Risk (11/07/2023)   Patient History    Smoking Tobacco Use: Never    Smokeless Tobacco Use: Never    Passive Exposure: Not on file  Financial Resource Strain: Low Risk (04/21/2023)   Overall Financial Resource Strain (CARDIA)    Difficulty of Paying Living Expenses: Not hard at all  Food Insecurity: No Food Insecurity (04/21/2023)   Hunger Vital Sign    Worried About Running Out of Food in the Last Year: Never true    Ran Out of Food in the Last Year: Never true  Transportation Needs: No Transportation Needs (04/21/2023)   PRAPARE -  Administrator, Civil Service (Medical): No    Lack of Transportation (Non-Medical): No  Physical Activity: Sufficiently Active (04/21/2023)   Exercise Vital Sign    Days of Exercise per Week: 7 days    Minutes of Exercise per Session: 30 min  Stress: No Stress Concern Present (04/21/2023)   [employer] of Occupational Health - Occupational Stress Questionnaire    Feeling of Stress : Not at all  Social Connections: Socially Isolated (04/21/2023)   Social Connection and Isolation Panel    Frequency of  Communication with Friends and Family: More than three times a week    Frequency of Social Gatherings with Friends and Family: More than three times a week    Attends Religious Services: Never    Database Administrator or Organizations: No    Attends Banker Meetings: Never    Marital Status: Divorced  Depression (PHQ2-9): Low Risk (01/30/2024)   Depression (PHQ2-9)    PHQ-2 Score: 0  Alcohol Screen: Low Risk (04/21/2023)   Alcohol Screen    Last Alcohol Screening Score (AUDIT): 4  Housing: Low Risk (04/21/2023)   Housing Stability Vital Sign    Unable to Pay for Housing in the Last Year: No    Number of Times Moved in the Last Year: 0    Homeless in the Last Year: No  Utilities: Not At Risk (04/21/2023)   AHC Utilities    Threatened with loss of utilities: No  Health Literacy: Not on file     Family History: The patient's family history includes Arthritis in her mother; Cancer in her sister; Diabetes in her father; Heart attack in her father; Heart disease in her mother; Hyperlipidemia in her mother; Hypertension in her mother; Osteoporosis in her mother; Stroke in her mother. There is no history of Colon cancer. She was adopted.  ROS:   Please see the history of present illness.     All other systems reviewed and are negative.  EKGs/Labs/Other Studies Reviewed:    The following studies were reviewed today:   EKG:   11/07/2023: Sinus bradycardia,  rate 54, no ST abnormalities  Recent Labs: 07/30/2023: Hemoglobin 12.8; Platelets 309.0; TSH 1.85 01/30/2024: ALT 17 02/20/2024: BUN 22; Creatinine, Ser 0.98; Magnesium  1.7; Potassium 5.3; Sodium 138  Recent Lipid Panel    Component Value Date/Time   CHOL 154 07/30/2023 1018   CHOL 170 04/21/2020 1011   TRIG 92.0 07/30/2023 1018   HDL 67.20 07/30/2023 1018   HDL 76 04/21/2020 1011   CHOLHDL 2 07/30/2023 1018   VLDL 18.4 07/30/2023 1018   LDLCALC 68 07/30/2023 1018   LDLCALC 74 04/21/2020 1011   LDLDIRECT 132.0 02/21/2015 1331    Physical Exam:    VS:  BP (!) 151/63 (BP Location: Right Arm, Patient Position: Sitting, Cuff Size: Normal)   Pulse 65   Ht 5' 3.6 (1.615 m)   Wt 140 lb 9.6 oz (63.8 kg)   LMP 12/29/2011   BMI 24.44 kg/m     Wt Readings from Last 3 Encounters:  02/20/24 140 lb 9.6 oz (63.8 kg)  01/30/24 145 lb 9.6 oz (66 kg)  11/07/23 143 lb 12.8 oz (65.2 kg)     GEN:  Well nourished, well developed in no acute distress HEENT: Normal NECK: No JVD; bilateral carotid bruits LYMPHATICS: No lymphadenopathy CARDIAC: RRR, 2/6 systolic murmur RESPIRATORY:  Clear to auscultation without rales, wheezing or rhonchi  ABDOMEN: Soft, non-tender, non-distended MUSCULOSKELETAL:  No edema; No deformity  SKIN: Warm and dry NEUROLOGIC:  Alert and oriented x 3 PSYCHIATRIC:  Normal affect   ASSESSMENT:    1. SVT (supraventricular tachycardia)   2. Palpitations   3. Essential hypertension   4. Syncope and collapse   5. Mixed hyperlipidemia   6. History of CVA (cerebrovascular accident)     PLAN:    SVT: Zio patch x 14 days 11/2023 showed 90 episodes of SVT, longest lasting 1 hour 48 minutes with average rate 155 bpm.  Patient triggered events corresponded to SVT.  She is  on metoprolol  25 mg twice daily - Recommend EP referral  Syncope: Suspect vasovagal in setting of dehydration given prodromal symptoms.  Zio patch x 14 days 11/2023 showed 90 episodes of SVT, longest  lasting 1 hour 48 minutes with average rate 155 bpm.  Echocardiogram 12/22/2023 showed normal biventricular function, no significant valvular disease.  Hypertension: On HCTZ 12.5 mg daily, irbesartan  75 daily, amlodipine  5 mg daily, metoprolol  25 mg twice daily.  Will consolidate metoprolol  to Toprol -XL 50 mg daily.  Asked to check BP twice daily for next week and let us  know results  Hyperlipidemia: On atorvastatin  40 mg daily.  LDL 68 on 07/30/2023  CVA: Continue aspirin , statin  Carotid artery disease: Carotid duplex 10/2022 showed 1 to 39% bilateral carotid stenosis.  Status post right CEA.  RTC in 6 months   Medication Adjustments/Labs and Tests Ordered: Current medicines are reviewed at length with the patient today.  Concerns regarding medicines are outlined above.  Orders Placed This Encounter  Procedures   Basic Metabolic Panel (BMET)   Magnesium    Ambulatory referral to Cardiac Electrophysiology   Meds ordered this encounter  Medications   metoprolol  succinate (TOPROL  XL) 50 MG 24 hr tablet    Sig: Take 1 tablet (50 mg total) by mouth daily. Take with or immediately following a meal.    Dispense:  90 tablet    Refill:  3    Patient Instructions  Medication Instructions:  stop Lopressor  25mg  as directed by your provider Start Toprol  XL 50 mg Your physician recommends that you continue on your current medications as directed. Please refer to the Current Medication list given to you today.  *If you need a refill on your cardiac medications before your next appointment, please call your pharmacy*  Lab Work: Bmet, mg today If you have labs (blood work) drawn today and your tests are completely normal, you will receive your results only by: MyChart Message (if you have MyChart) OR A paper copy in the mail If you have any lab test that is abnormal or we need to change your treatment, we will call you to review the results.  Testing/Procedures: none  Follow-Up: At [facility], you and your health needs are our priority.  As part of our continuing mission to provide you with exceptional heart care, our providers are all part of one team.  This team includes your primary Cardiologist (physician) and Advanced Practice Providers or APPs (Physician Assistants and Nurse Practitioners) who all work together to provide you with the care you need, when you need it.  Your next appointment:   6 months  Provider:   Dr. Kate  We recommend signing up for the patient portal called MyChart.  Sign up information is provided on this After Visit Summary.  MyChart is used to connect with patients for Virtual Visits (Telemedicine).  Patients are able to view lab/test results, encounter notes, upcoming appointments, etc.  Non-urgent messages can be sent to your provider as well.   To learn more about what you can do with MyChart, go to [email or web address].   Other Instructions Referral to EP Please check blood pressure twice a day for one week and send those readindgs            Signed, Lonni LITTIE Kate, MD  02/21/2024 9:15 PM    [facility] "

## 2024-02-19 ENCOUNTER — Telehealth: Payer: Self-pay

## 2024-02-19 ENCOUNTER — Other Ambulatory Visit: Payer: Self-pay | Admitting: Nurse Practitioner

## 2024-02-19 DIAGNOSIS — F32A Depression, unspecified: Secondary | ICD-10-CM

## 2024-02-19 NOTE — Telephone Encounter (Signed)
 Copied from CRM #[id number]. Topic: Clinical - Prescription Issue >> Feb 19, 2024  9:32 AM Willma SAUNDERS wrote: Reason for CRM: Patient got a letter from her [facility] who advised they would no longer cover the cost for traMADol -acetaminophen  (ULTRACET ) 37.5-325 MG table. States the offered an alternative with Codeine  but she cannot take causes her to itch and break out. Would like to speak to NP Cable to see what other options she can be prescribed.  Patient can be reached at [phone]

## 2024-02-19 NOTE — Telephone Encounter (Signed)
 Has she checked with the pharmacy and see what the cash price of the medication is?

## 2024-02-20 ENCOUNTER — Ambulatory Visit: Attending: Cardiology | Admitting: Cardiology

## 2024-02-20 VITALS — BP 151/63 | HR 65 | Ht 63.6 in | Wt 140.6 lb

## 2024-02-20 DIAGNOSIS — E782 Mixed hyperlipidemia: Secondary | ICD-10-CM

## 2024-02-20 DIAGNOSIS — I471 Supraventricular tachycardia, unspecified: Secondary | ICD-10-CM | POA: Diagnosis not present

## 2024-02-20 DIAGNOSIS — Z8673 Personal history of transient ischemic attack (TIA), and cerebral infarction without residual deficits: Secondary | ICD-10-CM

## 2024-02-20 DIAGNOSIS — R55 Syncope and collapse: Secondary | ICD-10-CM | POA: Diagnosis not present

## 2024-02-20 DIAGNOSIS — R002 Palpitations: Secondary | ICD-10-CM | POA: Diagnosis not present

## 2024-02-20 DIAGNOSIS — I10 Essential (primary) hypertension: Secondary | ICD-10-CM

## 2024-02-20 LAB — BASIC METABOLIC PANEL WITH GFR
BUN/Creatinine Ratio: 22 (ref 12–28)
BUN: 22 mg/dL (ref 8–27)
CO2: 23 mmol/L (ref 20–29)
Calcium: 10.4 mg/dL — ABNORMAL HIGH (ref 8.7–10.3)
Chloride: 102 mmol/L (ref 96–106)
Creatinine, Ser: 0.98 mg/dL (ref 0.57–1.00)
Glucose: 97 mg/dL (ref 70–99)
Potassium: 5.3 mmol/L — ABNORMAL HIGH (ref 3.5–5.2)
Sodium: 138 mmol/L (ref 134–144)
eGFR: 59 mL/min/1.73 — ABNORMAL LOW

## 2024-02-20 LAB — MAGNESIUM: Magnesium: 1.7 mg/dL (ref 1.6–2.3)

## 2024-02-20 MED ORDER — METOPROLOL SUCCINATE ER 50 MG PO TB24: 50.0000 mg | ORAL_TABLET | Freq: Every day | ORAL | 3 refills | Status: AC

## 2024-02-20 NOTE — Patient Instructions (Addendum)
 Medication Instructions:  stop Lopressor  25mg  as directed by your provider Start Toprol  XL 50 mg Your physician recommends that you continue on your current medications as directed. Please refer to the Current Medication list given to you today.  *If you need a refill on your cardiac medications before your next appointment, please call your pharmacy*  Lab Work: Bmet, mg today If you have labs (blood work) drawn today and your tests are completely normal, you will receive your results only by: MyChart Message (if you have MyChart) OR A paper copy in the mail If you have any lab test that is abnormal or we need to change your treatment, we will call you to review the results.  Testing/Procedures: none  Follow-Up: At [facility], you and your health needs are our priority.  As part of our continuing mission to provide you with exceptional heart care, our providers are all part of one team.  This team includes your primary Cardiologist (physician) and Advanced Practice Providers or APPs (Physician Assistants and Nurse Practitioners) who all work together to provide you with the care you need, when you need it.  Your next appointment:   6 months  Provider:   Dr. Kate  We recommend signing up for the patient portal called MyChart.  Sign up information is provided on this After Visit Summary.  MyChart is used to connect with patients for Virtual Visits (Telemedicine).  Patients are able to view lab/test results, encounter notes, upcoming appointments, etc.  Non-urgent messages can be sent to your provider as well.   To learn more about what you can do with MyChart, go to [email or web address].   Other Instructions Referral to EP Please check blood pressure twice a day for one week and send those readindgs

## 2024-02-20 NOTE — Telephone Encounter (Signed)
 LMTCB

## 2024-02-20 NOTE — Telephone Encounter (Signed)
 Spoke with patient about her medication and she is going to pay out of pocket for the medication.

## 2024-02-22 ENCOUNTER — Ambulatory Visit: Payer: Self-pay | Admitting: Cardiology

## 2024-02-22 DIAGNOSIS — I10 Essential (primary) hypertension: Secondary | ICD-10-CM

## 2024-02-22 DIAGNOSIS — R002 Palpitations: Secondary | ICD-10-CM

## 2024-02-22 DIAGNOSIS — E875 Hyperkalemia: Secondary | ICD-10-CM

## 2024-02-24 ENCOUNTER — Telehealth: Payer: Self-pay | Admitting: Cardiology

## 2024-02-24 MED ORDER — ATORVASTATIN CALCIUM 40 MG PO TABS: 40.0000 mg | ORAL_TABLET | Freq: Every day | ORAL | 3 refills | Status: AC

## 2024-02-24 NOTE — Telephone Encounter (Signed)
" °*  STAT* If patient is at the pharmacy, call can be transferred to refill team.   1. Which medications need to be refilled? (please list name of each medication and dose if known)   atorvastatin  (LIPITOR) 40 MG tablet  irbesartan  (AVAPRO ) 75 MG tablet   2. Would you like to learn more about the convenience, safety, & potential cost savings by using the [facility]?    3. Are you open to using the [facility] (Type [facility].    4. Which pharmacy/location (including street and city if local pharmacy) is medication to be sent to?  [id number] - [name], [place] - [address]     5. Do they need a 30 day or 90 day supply? 90  Patient is out of meds  "

## 2024-02-24 NOTE — Telephone Encounter (Signed)
 Refill sent

## 2024-03-02 ENCOUNTER — Telehealth: Payer: Self-pay

## 2024-03-02 ENCOUNTER — Other Ambulatory Visit (HOSPITAL_COMMUNITY): Payer: Self-pay

## 2024-03-02 NOTE — Telephone Encounter (Signed)
 Pharmacy Patient Advocate Encounter   Received notification from [facility] that prior authorization for Tramadol -acetaminophen  37.5-325 is required/requested.   Insurance verification completed.   The patient is insured through [facility].   Per test claim: PA required; PA submitted to above mentioned insurance via Latent Key/confirmation #/EOC [id number] Status is pending

## 2024-03-03 ENCOUNTER — Other Ambulatory Visit (HOSPITAL_COMMUNITY): Payer: Self-pay

## 2024-03-03 ENCOUNTER — Other Ambulatory Visit: Payer: Self-pay | Admitting: Nurse Practitioner

## 2024-03-03 DIAGNOSIS — A6004 Herpesviral vulvovaginitis: Secondary | ICD-10-CM

## 2024-03-03 NOTE — Telephone Encounter (Signed)
 Clinical questions have been answered and PA submitted. PA currently Pending. Please be advised that most companies allow up to 30 days to make a decision. We will advise when a determination has been made, or follow up in 1 week.   Please reach out to our team, Rx Prior Auth Pool, if you haven't heard back in a week.

## 2024-03-05 ENCOUNTER — Other Ambulatory Visit: Payer: Self-pay | Admitting: Nurse Practitioner

## 2024-04-01 ENCOUNTER — Ambulatory Visit: Admitting: Cardiology

## 2024-04-26 ENCOUNTER — Ambulatory Visit

## 2024-08-02 ENCOUNTER — Encounter: Admitting: Nurse Practitioner
